# Patient Record
Sex: Female | Born: 1940 | Race: White | Hispanic: No | Smoking: Never smoker
Health system: Southern US, Community
[De-identification: ages and names within clinical notes are randomized; demographics above are authoritative.]

## PROBLEM LIST (undated history)

## (undated) DIAGNOSIS — I2510 Atherosclerotic heart disease of native coronary artery without angina pectoris: Secondary | ICD-10-CM

## (undated) DIAGNOSIS — I10 Essential (primary) hypertension: Secondary | ICD-10-CM

## (undated) DIAGNOSIS — E7800 Pure hypercholesterolemia, unspecified: Secondary | ICD-10-CM

## (undated) HISTORY — PX: GASTRIC BYPASS: SHX52

## (undated) HISTORY — PX: FACIAL COSMETIC SURGERY: SHX629

## (undated) HISTORY — PX: OTHER SURGICAL HISTORY: SHX169

## (undated) HISTORY — PX: COLON RESECTION: SHX5231

## (undated) HISTORY — PX: ABDOMINAL HYSTERECTOMY: SHX81

## (undated) HISTORY — PX: FRACTURE SURGERY: SHX138

## (undated) NOTE — *Deleted (*Deleted)
Date of Admission:  07/12/2020   Total days of antibiotics ***        Day ***        Day ***        Day ***   ID: Casey Baldwin is a 76 y.o. female with  *** Active Problems:   SIRS (systemic inflammatory response syndrome) (HCC)   Shingles rash   Delirium   Acute metabolic encephalopathy   Essential hypertension   Anemia   Acute lower UTI   Chronic diastolic CHF (congestive heart failure) (HCC)   Sepsis (HCC)   Change in mental status   Anorexia   Malnutrition of moderate degree   Hyponatremia   Obstructive sleep apnea   PSVT (paroxysmal supraventricular tachycardia) (HCC)   Thrombocytosis   Acute urinary retention   Aspiration pneumonia of both lower lobes due to gastric secretions (HCC)   Viral encephalitis    Subjective: ***  Medications:  . amoxicillin-clavulanate  800 mg Oral Q12H  . Chlorhexidine Gluconate Cloth  6 each Topical Daily  . enoxaparin (LOVENOX) injection  40 mg Subcutaneous Q24H  . feeding supplement (ENSURE ENLIVE)  237 mL Oral TID BM  . free water  20 mL Per Tube Q6H  . insulin aspart  0-9 Units Subcutaneous Q4H  . lactulose  20 g Oral BID  . liver oil-zinc oxide   Topical BID  . megestrol  400 mg Oral Daily  . melatonin  5 mg Oral QHS  . metoprolol tartrate  12.5 mg Oral BID  . modafinil  100 mg Oral Daily  . multivitamin  15 mL Oral Daily  . pantoprazole sodium  40 mg Oral Daily  . sodium chloride flush  10-40 mL Intracatheter Q12H  . sodium chloride flush  3 mL Intravenous Q12H  . sodium chloride  1 g Oral BID WC  . tamsulosin  0.4 mg Oral QPC supper    Objective: Vital signs in last 24 hours: Temp:  [97.8 F (36.6 C)-98.7 F (37.1 C)] 97.8 F (36.6 C) (10/13 0738) Pulse Rate:  [88-91] 91 (10/13 0738) Resp:  [16-19] 19 (10/13 0738) BP: (134-146)/(73-80) 134/80 (10/13 0738) SpO2:  [98 %-99 %] 98 % (10/13 0738) Weight:  [75.7 kg] 75.7 kg (10/13 0137)  PHYSICAL EXAM:  General: Alert, cooperative, no distress, appears stated  age.  Head: Normocephalic, without obvious abnormality, atraumatic. Eyes: Conjunctivae clear, anicteric sclerae. Pupils are equal ENT Nares normal. No drainage or sinus tenderness. Lips, mucosa, and tongue normal. No Thrush Neck: Supple, symmetrical, no adenopathy, thyroid: non tender no carotid bruit and no JVD. Back: No CVA tenderness. Lungs: Clear to auscultation bilaterally. No Wheezing or Rhonchi. No rales. Heart: Regular rate and rhythm, no murmur, rub or gallop. Abdomen: Soft, non-tender,not distended. Bowel sounds normal. No masses Extremities: atraumatic, no cyanosis. No edema. No clubbing Skin: No rashes or lesions. Or bruising Lymph: Cervical, supraclavicular normal. Neurologic: Grossly non-focal  Lab Results CBC Latest Ref Rng & Units 08/21/2020 08/19/2020 08/15/2020  WBC 4.0 - 10.5 K/uL 10.4 10.3 9.0  Hemoglobin 12.0 - 15.0 g/dL 7.8(I) 6.9(G) 2.9(B)  Hematocrit 36 - 46 % 28.9(L) 26.7(L) 27.3(L)  Platelets 150 - 400 K/uL 366 274 322    CMP Latest Ref Rng & Units 08/21/2020 08/19/2020 08/16/2020  Glucose 70 - 99 mg/dL 284(X) 324(M) 010(U)  BUN 8 - 23 mg/dL 72(Z) 36(U) 44(I)  Creatinine 0.44 - 1.00 mg/dL 3.47 4.25 9.56  Sodium 135 - 145 mmol/L 134(L) 134(L) 136  Potassium 3.5 -  5.1 mmol/L 4.3 4.0 3.9  Chloride 98 - 111 mmol/L 99 100 99  CO2 22 - 32 mmol/L 24 24 28   Calcium 8.9 - 10.3 mg/dL 9.5 1.1(B) 9.0  Total Protein 6.5 - 8.1 g/dL - - -  Total Bilirubin 0.3 - 1.2 mg/dL - - -  Alkaline Phos 38 - 126 U/L - - -  AST 15 - 41 U/L - - -  ALT 0 - 44 U/L - - -    Assessment/Plan: Prolonged hospitalization with encephalopathy with risk of aspiration I do not see signs of aspiration pneumonia on CXR, clinical examination. So would recommend stopping antibiotics Dubhoff was removed on Monday which will decrease risk for aspiration  Hypoactive deliriumfollowing VZV meningoencephalitis - fluctuating level of alertness- overall improving alertness in the past week.    Periods of lucidity and interaction with somnolence. She needs constant stimulation and activity Need PT/OT/speech, OOB, breathing exercise,Regular bowel movts, Naoptimization andc[facility]  Admitted on 9/12 with chest pain rt side, fever, headache and leg painandintermittent confusion  Found to have herpes zoster Thoracic dermatome  Herpes zoster with aseptic meningitis on admission ( patient had refused LPinitially)- was started on IV acyclovir 10mg /Kg q 8. She was alert and verbal and appropriate on presentation but after 5-6 days gradual deterioration to obtundation. Seen by neuro- delirium was questioned -EEG on 07/20/20 no seizure activity..There were many reasons including changes in sleep rhythm, poor intake, constipation, not wearing CPAp ( but no co2 narcosis) hyponatremia for the obtundation On 9/14 Had LP which revealed lymphocytic pleocytosis bacterial culture neg and HSV DNA neg- VZVneg .VzV ig G very high> 4000 west Nile virus IgG in csf was positive but not IgM and serum antibodies neg- so this isless likelyacute WNV and IgG is not a reliable test for diagnosis of acute infection as there is cross reactivity with other flavi virus and dengue-There is no treatment for WNV.  MRI and MRA done on 9/14 no acute changes. Daughter wanted her to be in [place], but no bed andapparently [facility] has declined [facility] transfer Repeat EEG done on 07/25/20 no seizure activity noted. keppra stopped So the diagnosis remains VZV inducedmeningoencephalitis) Completed 21 days of acycolvir threapy for VZV induced meningo encephalitison 08/02/20  waxing and waning mentalstatussince earlier in admission  Urinary retention -9/12- has foley-failed voiding trial on 9/28 and reinserted 9/29  Stiffness and pain lower extremities and neck due to immobility ( Xray and MRI of the rt leg- nothing acute)  Rt sided abdominal firmness and fullness CT abdomen  showedFluid collectionpseudocyst  Complicated stay at [facility] between 04/01/20 until 05/14/20 for abdominal pain which was diagnosed as choledocholithiasis/cholangitis in a setting of prior RYGB in 2008. 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 and she was [facility] to [facility] for diagnostic lap, robotic lysis of adhesions, transgastric ERCP and biliary sphincterotomy, cholecystectomy, partial gastrectomy andaccidentalcolotomy which was repaired.The immediate post op was complicated by post ERCP pancreatitis , pulmonary edema with acute hypoxic resp failure , Afib ( treated with amio and metoprolol), fever and leucocytosis suspected due to pancreatitis and was treated with zosyn, UTI due to proteus and acute on chronic anemia needing PRBC and AKI.She was seen by gerontologist for insomnia and risk for delirium   H/o left TKA  Hearing loss   Discussed the management withcare team

---

## 2017-09-05 ENCOUNTER — Emergency Department (HOSPITAL_COMMUNITY)
Admission: EM | Admit: 2017-09-05 | Discharge: 2017-09-05 | Disposition: A | Discharge status: Home / Self Care | Payer: Medicare Other | Attending: Emergency Medicine | Admitting: Emergency Medicine

## 2017-09-05 ENCOUNTER — Encounter (HOSPITAL_COMMUNITY): Payer: Self-pay | Admitting: *Deleted

## 2017-09-05 ENCOUNTER — Emergency Department (HOSPITAL_COMMUNITY): Payer: Medicare Other

## 2017-09-05 DIAGNOSIS — I10 Essential (primary) hypertension: Secondary | ICD-10-CM | POA: Insufficient documentation

## 2017-09-05 DIAGNOSIS — Z79899 Other long term (current) drug therapy: Secondary | ICD-10-CM | POA: Diagnosis not present

## 2017-09-05 DIAGNOSIS — N390 Urinary tract infection, site not specified: Secondary | ICD-10-CM | POA: Diagnosis not present

## 2017-09-05 DIAGNOSIS — D649 Anemia, unspecified: Secondary | ICD-10-CM | POA: Diagnosis not present

## 2017-09-05 DIAGNOSIS — Z96652 Presence of left artificial knee joint: Secondary | ICD-10-CM | POA: Insufficient documentation

## 2017-09-05 DIAGNOSIS — I2510 Atherosclerotic heart disease of native coronary artery without angina pectoris: Secondary | ICD-10-CM | POA: Diagnosis not present

## 2017-09-05 DIAGNOSIS — Z91040 Latex allergy status: Secondary | ICD-10-CM | POA: Diagnosis not present

## 2017-09-05 DIAGNOSIS — R300 Dysuria: Secondary | ICD-10-CM | POA: Diagnosis present

## 2017-09-05 HISTORY — DX: Pure hypercholesterolemia, unspecified: E78.00

## 2017-09-05 HISTORY — DX: Atherosclerotic heart disease of native coronary artery without angina pectoris: I25.10

## 2017-09-05 HISTORY — DX: Essential (primary) hypertension: I10

## 2017-09-05 LAB — URINALYSIS, ROUTINE W REFLEX MICROSCOPIC
Bilirubin Urine: NEGATIVE
GLUCOSE, UA: NEGATIVE mg/dL
KETONES UR: NEGATIVE mg/dL
Nitrite: NEGATIVE
PROTEIN: NEGATIVE mg/dL
Specific Gravity, Urine: 1.019 (ref 1.005–1.030)
pH: 5 (ref 5.0–8.0)

## 2017-09-05 LAB — BASIC METABOLIC PANEL
Anion gap: 10 (ref 5–15)
BUN: 22 mg/dL — AB (ref 6–20)
CHLORIDE: 103 mmol/L (ref 101–111)
CO2: 23 mmol/L (ref 22–32)
Calcium: 8.8 mg/dL — ABNORMAL LOW (ref 8.9–10.3)
Creatinine, Ser: 1.07 mg/dL — ABNORMAL HIGH (ref 0.44–1.00)
GFR calc Af Amer: 56 mL/min — ABNORMAL LOW (ref >60)
GFR calc non Af Amer: 48 mL/min — ABNORMAL LOW (ref >60)
GLUCOSE: 137 mg/dL — AB (ref 65–99)
POTASSIUM: 4.1 mmol/L (ref 3.5–5.1)
SODIUM: 136 mmol/L (ref 135–145)

## 2017-09-05 LAB — CBC
HCT: 31.5 % — ABNORMAL LOW (ref 36.0–46.0)
HEMOGLOBIN: 10.2 g/dL — AB (ref 12.0–15.0)
MCH: 31.2 pg (ref 26.0–34.0)
MCHC: 32.4 g/dL (ref 30.0–36.0)
MCV: 96.3 fL (ref 78.0–100.0)
Platelets: 236 10*3/uL (ref 150–400)
RBC: 3.27 MIL/uL — AB (ref 3.87–5.11)
RDW: 13.7 % (ref 11.5–15.5)
WBC: 9.1 10*3/uL (ref 4.0–10.5)

## 2017-09-05 LAB — I-STAT CG4 LACTIC ACID, ED: Lactic Acid, Venous: 0.68 mmol/L (ref 0.5–1.9)

## 2017-09-05 MED ORDER — ACETAMINOPHEN 325 MG PO TABS
650.0000 mg | ORAL_TABLET | Freq: Once | ORAL | Status: AC
  Administered 2017-09-05: 650 mg via ORAL
  Filled 2017-09-05: qty 2

## 2017-09-05 MED ORDER — SODIUM CHLORIDE 0.9 % IV BOLUS (SEPSIS): 1000.0000 mL | Freq: Once | INTRAVENOUS | Status: DC

## 2017-09-05 MED ORDER — SODIUM CHLORIDE 0.9 % IV BOLUS (SEPSIS)
1000.0000 mL | Freq: Once | INTRAVENOUS | Status: AC
  Administered 2017-09-05: 1000 mL via INTRAVENOUS

## 2017-09-05 MED ORDER — DEXTROSE 5 % IV SOLN
2.0000 g | Freq: Once | INTRAVENOUS | Status: AC
  Administered 2017-09-05: 2 g via INTRAVENOUS
  Filled 2017-09-05: qty 2

## 2017-09-05 MED ORDER — CEPHALEXIN 500 MG PO CAPS: 500.0000 mg | ORAL_CAPSULE | Freq: Four times a day (QID) | ORAL | 0 refills | Status: DC

## 2017-09-05 NOTE — Discharge Instructions (Signed)
Get plenty of rest and drink a lot of fluids.  Take Tylenol as needed for fever, every 4 hours.  An appropriate dose for you is 650 mg.  Your hemoglobin was somewhat low, 10.2.  Have your doctor recheck it when you see her.  Start the antibiotic prescription, cephalexin, tomorrow.

## 2017-09-05 NOTE — ED Triage Notes (Signed)
Pt reports lower back pain, urinary frequency, urgency and dysuria. Pt also c/o fever and chills. Highest temperature was 102.4 and pt took aspirin. Pt's temp in triage was 101.0. [facility]

## 2017-09-05 NOTE — ED Provider Notes (Signed)
[facility] EMERGENCY DEPARTMENT Provider Note   CSN: [id number] Arrival date & time: 09/05/17  4098     History   Chief Complaint Chief Complaint  Patient presents with  . Dysuria    fever, chills, lower part of her back      HPI Casey Baldwin is a 76 y.o. female.  She presents for evaluation of fever and dysuria.  Symptoms started 1 week ago.  Like she is getting worse, with some dizziness, decreased appetite, and generalized weakness.  She is taking aspirin for fever.  She had rigors earlier today.  She is here with family members.  She is visiting from out of town.  Last UTI was 6 months ago.  There are no other known modifying factors.   HPI  Past Medical History:  Diagnosis Date  . Coronary artery disease   . High cholesterol   . Hypertension     There are no active problems to display for this patient.   Past Surgical History:  Procedure Laterality Date  . COLON RESECTION    . FACIAL COSMETIC SURGERY    . FRACTURE SURGERY     left leg and left ankle.  . left knee replacement      OB History    No data available       Home Medications    Prior to Admission medications   Medication Sig Start Date End Date Taking? Authorizing Provider  Artificial Tear Ointment (DRY EYES OP) Place 1-2 drops into both eyes daily as needed.   Yes [provider]  aspirin-acetaminophen-caffeine (EXCEDRIN MIGRAINE) [id number] MG tablet Take 2 tablets by mouth every 6 (six) hours as needed for headache or migraine.   Yes [provider]  Biotin 1 MG CAPS Take 1 capsule by mouth daily.   Yes [provider]  calcium carbonate (OS-CAL) 1250 (500 Ca) MG chewable tablet Chew 2 tablets by mouth daily.   Yes [provider]  Cholecalciferol (VITAMIN D PO) Take 1 tablet by mouth daily.   Yes [provider]  Cyanocobalamin (B12 LIQUID HEALTH BOOSTER PO) Take 1 Dose by mouth daily.   Yes [provider]  Multiple Vitamin  (MULTIVITAMIN WITH MINERALS) TABS tablet Take 1 tablet by mouth daily.   Yes [provider]  Multiple Vitamins-Minerals (ICAPS AREDS 2 PO) Take 1 capsule by mouth daily.   Yes [provider]  omeprazole (PRILOSEC) 40 MG capsule Take 40 mg by mouth daily.   Yes [provider]  pravastatin (PRAVACHOL) 20 MG tablet Take 20 mg by mouth daily.   Yes [provider]    Family History History reviewed. No pertinent family history.  Social History Social History  Substance Use Topics  . Smoking status: Never Smoker  . Smokeless tobacco: Never Used  . Alcohol use No     Allergies   Lactose intolerance (gi) and Latex   Review of Systems Review of Systems  All other systems reviewed and are negative.    Physical Exam Updated Vital Signs BP (!) 110/57   Pulse 79   Temp (!) 101.1 F (38.4 C) (Oral)   Resp 16   Ht 5\' 4"  (1.626 m)   Wt 88 kg (194 lb)   SpO2 96%   BMI 33.30 kg/m   Physical Exam  Constitutional: She is oriented to person, place, and time. She appears well-developed. No distress.  Elderly, frail  HENT:  Head: Normocephalic and atraumatic.  Eyes: Pupils are equal, round, and  reactive to light. Conjunctivae and EOM are normal.  Neck: Normal range of motion and phonation normal. Neck supple.  Cardiovascular: Normal rate and regular rhythm.   Pulmonary/Chest: Effort normal and breath sounds normal. She exhibits no tenderness.  Abdominal: Soft. She exhibits no distension. There is no tenderness. There is no guarding.  Genitourinary:  Genitourinary Comments: No costovertebral angle tenderness with percussion.  Musculoskeletal: Normal range of motion.  Neurological: She is alert and oriented to person, place, and time. She exhibits normal muscle tone.  Skin: Skin is warm and dry.  Psychiatric: She has a normal mood and affect. Her behavior is normal.  Nursing note and vitals reviewed.    ED Treatments / Results  Labs (all  labs ordered are listed, but only abnormal results are displayed) Labs Reviewed  CBC - Abnormal; Notable for the following:       Result Value   RBC 3.27 (*)    Hemoglobin 10.2 (*)    HCT 31.5 (*)    All other components within normal limits  BASIC METABOLIC PANEL - Abnormal; Notable for the following:    Glucose, Bld 137 (*)    BUN 22 (*)    Creatinine, Ser 1.07 (*)    Calcium 8.8 (*)    GFR calc non Af Amer 48 (*)    GFR calc Af Amer 56 (*)    All other components within normal limits  URINALYSIS, ROUTINE W REFLEX MICROSCOPIC - Abnormal; Notable for the following:    APPearance HAZY (*)    Hgb urine dipstick SMALL (*)    Leukocytes, UA MODERATE (*)    Bacteria, UA RARE (*)    Squamous Epithelial / LPF 0-5 (*)    All other components within normal limits  CULTURE, BLOOD (ROUTINE X 2)  CULTURE, BLOOD (ROUTINE X 2)  URINE CULTURE  I-STAT CG4 LACTIC ACID, ED  I-STAT CG4 LACTIC ACID, ED    EKG  EKG Interpretation None       Radiology Dg Chest Port 1 View  Result Date: 09/05/2017 CLINICAL DATA:  Fever. Sepsis. Back pain and urinary frequency with dysuria. EXAM: PORTABLE CHEST 1 VIEW COMPARISON:  None. FINDINGS: Lungs are adequately inflated without focal consolidation or effusion. Cardiomediastinal silhouette is within normal. There is calcified plaque over the aortic arch. Remaining bones and soft tissues are within normal. IMPRESSION: No acute cardiopulmonary disease. Electronically Signed   By: Elberta Fortisaniel  Boyle M.D.   On: 09/05/2017 20:15    Procedures Procedures (including critical care time)  Medications Ordered in ED Medications  sodium chloride 0.9 % bolus 1,000 mL (0 mLs Intravenous Stopped 09/05/17 2051)    And  sodium chloride 0.9 % bolus 1,000 mL (0 mLs Intravenous Stopped 09/05/17 2159)    And  sodium chloride 0.9 % bolus 1,000 mL (not administered)  acetaminophen (TYLENOL) tablet 650 mg (not administered)  cefTRIAXone (ROCEPHIN) 2 g in dextrose 5 % 50 mL  IVPB (0 g Intravenous Stopped 09/05/17 2051)     Initial Impression / Assessment and Plan / ED Course  I have reviewed the triage vital signs and the nursing notes.  Pertinent labs & imaging results that were available during my care of the patient were reviewed by me and considered in my medical decision making (see chart for details).  Clinical Course as of Sep 05 2221  Sat Sep 05, 2017  2217 Normal Sodium: 136 [EW]  2217 Mild elevation Creatinine: (!) 1.07 [EW]  2218 GFR, Est Non African American: Marland Kitchen(!)  48 [EW]  2218 Normal Lactic Acid, Venous: 0.68 [EW]  2218 Normal WBC: 9.1 [EW]  2218 Slightly low Hemoglobin: (!) 10.2 [EW]  2218 Abnormal, consistent with UTI WBC, UA: TOO NUMEROUS TO COUNT [EW]  2218 Normal DG Chest Port 1 View [EW]    Clinical Course User Index [EW] Mancel Bale, MD     Patient Vitals for the past 24 hrs:  BP Temp Temp src Pulse Resp SpO2 Height Weight  09/05/17 2025 - - - 79 16 96 % - -  09/05/17 2021 (!) 110/57 - - - (!) 21 - - -  09/05/17 2000 112/60 - - - - - - -  09/05/17 1923 - - - - - - 5\' 4"  (1.626 m) 88 kg (194 lb)  09/05/17 1920 128/72 (!) 101.1 F (38.4 C) Oral (!) 54 20 97 % - -    10:10 PM Reevaluation with update and discussion. After initial assessment and treatment, an updated evaluation reveals she is more comfortable now and has no further complaints.  Findings discussed with patient and daughters, all questions answered. Frutoso Dimare L      Final Clinical Impressions(s) / ED Diagnoses   Final diagnoses:  Urinary tract infection without hematuria, site unspecified  Anemia, unspecified type   UTI, with fever, but no signs for sepsis, metabolic instability or impending vascular collapse.  Nursing Notes Reviewed/ Care Coordinated Applicable Imaging Reviewed Interpretation of Laboratory Data incorporated into ED treatment  The patient appears reasonably screened and/or stabilized for discharge and I doubt any other medical  condition or other [facility] requiring further screening, evaluation, or treatment in the ED at this time prior to discharge.  Plan: Home Medications-continue current medications; Home Treatments-rest, fluids; return here if the recommended treatment, does not improve the symptoms; Recommended follow up-PCP checkup 1 week and as needed   New Prescriptions New Prescriptions   No medications on file     Mancel Bale, MD 09/05/17 2224

## 2017-09-08 LAB — URINE CULTURE: Culture: >=100000 — AB

## 2017-09-09 ENCOUNTER — Telehealth: Payer: Self-pay | Admitting: *Deleted

## 2017-09-09 NOTE — Telephone Encounter (Signed)
Post ED Visit - Positive Culture Follow-up  Culture report reviewed by antimicrobial stewardship pharmacist:  []  Enzo BiNathan Batchelder, Pharm.D. []  Celedonio MiyamotoJeremy Frens, [address]Ph[address] BCPS AQ-ID []  Garvin FilaMike Maccia, Pharm.D., BCPS []  Georgina PillionElizabeth Martin, [address]Ph[address] BCPS []  FrazerMinh Pham, [address]Ph[address] BCPS, AAHIVP []  Estella HuskMichelle Turner, Pharm.D., BCPS, AAHIVP []  Lysle Pearlachel Rumbarger, PharmD, BCPS []  Casilda Carlsaylor Stone, PharmD, BCPS []  Pollyann SamplesAndy Johnston, PharmD, BCPS Lilli LightJon Oriet, PharmD Positive urine culture Treated with Cephalexin, organism sensitive to the same and no further patient follow-up is required at this time.  Virl AxeRobertson, Sonali Wivell Talley 09/09/2017, 12:06 PM

## 2017-09-10 LAB — CULTURE, BLOOD (ROUTINE X 2)
Culture: NO GROWTH
Culture: NO GROWTH
SPECIAL REQUESTS: ADEQUATE
SPECIAL REQUESTS: ADEQUATE

## 2019-11-21 ENCOUNTER — Emergency Department
Admission: EM | Admit: 2019-11-21 | Discharge: 2019-11-21 | Disposition: A | Discharge status: Home / Self Care | Payer: Medicare Other | Attending: Emergency Medicine | Admitting: Emergency Medicine

## 2019-11-21 ENCOUNTER — Emergency Department: Payer: Medicare Other

## 2019-11-21 ENCOUNTER — Other Ambulatory Visit: Payer: Self-pay

## 2019-11-21 DIAGNOSIS — Z79899 Other long term (current) drug therapy: Secondary | ICD-10-CM | POA: Insufficient documentation

## 2019-11-21 DIAGNOSIS — K5900 Constipation, unspecified: Secondary | ICD-10-CM | POA: Diagnosis not present

## 2019-11-21 DIAGNOSIS — Z91040 Latex allergy status: Secondary | ICD-10-CM | POA: Diagnosis not present

## 2019-11-21 DIAGNOSIS — K649 Unspecified hemorrhoids: Secondary | ICD-10-CM | POA: Diagnosis not present

## 2019-11-21 DIAGNOSIS — K6289 Other specified diseases of anus and rectum: Secondary | ICD-10-CM | POA: Diagnosis present

## 2019-11-21 DIAGNOSIS — I2510 Atherosclerotic heart disease of native coronary artery without angina pectoris: Secondary | ICD-10-CM | POA: Insufficient documentation

## 2019-11-21 DIAGNOSIS — K644 Residual hemorrhoidal skin tags: Secondary | ICD-10-CM

## 2019-11-21 DIAGNOSIS — I10 Essential (primary) hypertension: Secondary | ICD-10-CM | POA: Insufficient documentation

## 2019-11-21 LAB — URINALYSIS, COMPLETE (UACMP) WITH MICROSCOPIC
Bacteria, UA: NONE SEEN
Bilirubin Urine: NEGATIVE
Glucose, UA: NEGATIVE mg/dL
Hgb urine dipstick: NEGATIVE
Ketones, ur: NEGATIVE mg/dL
Leukocytes,Ua: NEGATIVE
Nitrite: NEGATIVE
Protein, ur: NEGATIVE mg/dL
Specific Gravity, Urine: 1.017 (ref 1.005–1.030)
pH: 5 (ref 5.0–8.0)

## 2019-11-21 LAB — LIPASE, BLOOD: Lipase: 52 U/L — ABNORMAL HIGH (ref 11–51)

## 2019-11-21 LAB — CBC
HCT: 34 % — ABNORMAL LOW (ref 36.0–46.0)
Hemoglobin: 11 g/dL — ABNORMAL LOW (ref 12.0–15.0)
MCH: 30.8 pg (ref 26.0–34.0)
MCHC: 32.4 g/dL (ref 30.0–36.0)
MCV: 95.2 fL (ref 80.0–100.0)
Platelets: 311 10*3/uL (ref 150–400)
RBC: 3.57 MIL/uL — ABNORMAL LOW (ref 3.87–5.11)
RDW: 13.8 % (ref 11.5–15.5)
WBC: 11.6 10*3/uL — ABNORMAL HIGH (ref 4.0–10.5)
nRBC: 0 % (ref 0.0–0.2)

## 2019-11-21 LAB — COMPREHENSIVE METABOLIC PANEL
ALT: 23 U/L (ref 0–44)
AST: 21 U/L (ref 15–41)
Albumin: 3.8 g/dL (ref 3.5–5.0)
Alkaline Phosphatase: 90 U/L (ref 38–126)
Anion gap: 9 (ref 5–15)
BUN: 26 mg/dL — ABNORMAL HIGH (ref 8–23)
CO2: 23 mmol/L (ref 22–32)
Calcium: 8.9 mg/dL (ref 8.9–10.3)
Chloride: 106 mmol/L (ref 98–111)
Creatinine, Ser: 1.14 mg/dL — ABNORMAL HIGH (ref 0.44–1.00)
GFR calc Af Amer: 53 mL/min — ABNORMAL LOW (ref >60)
GFR calc non Af Amer: 45 mL/min — ABNORMAL LOW (ref >60)
Glucose, Bld: 140 mg/dL — ABNORMAL HIGH (ref 70–99)
Potassium: 4.1 mmol/L (ref 3.5–5.1)
Sodium: 138 mmol/L (ref 135–145)
Total Bilirubin: 0.5 mg/dL (ref 0.3–1.2)
Total Protein: 7.4 g/dL (ref 6.5–8.1)

## 2019-11-21 MED ORDER — WITCH HAZEL-GLYCERIN EX PADS: 1.0000 "application " | MEDICATED_PAD | CUTANEOUS | 12 refills | Status: DC | PRN

## 2019-11-21 MED ORDER — MAGNESIUM CITRATE PO SOLN
1.0000 | Freq: Once | ORAL | Status: AC
  Administered 2019-11-21: 1 via ORAL
  Filled 2019-11-21: qty 296

## 2019-11-21 MED ORDER — SODIUM CHLORIDE 0.9% FLUSH
3.0000 mL | Freq: Once | INTRAVENOUS | Status: AC
  Administered 2019-11-21: 3 mL via INTRAVENOUS

## 2019-11-21 MED ORDER — HYDROCORTISONE ACETATE 25 MG RE SUPP: 25.0000 mg | Freq: Two times a day (BID) | RECTAL | 0 refills | Status: DC

## 2019-11-21 MED ORDER — SORBITOL 70 % SOLN
960.0000 mL | TOPICAL_OIL | Freq: Once | ORAL | Status: DC
  Filled 2019-11-21: qty 473

## 2019-11-21 MED ORDER — SENNOSIDES-DOCUSATE SODIUM 8.6-50 MG PO TABS: 2.0000 | ORAL_TABLET | Freq: Two times a day (BID) | ORAL | 0 refills | Status: DC

## 2019-11-21 MED ORDER — IOHEXOL 300 MG/ML  SOLN
75.0000 mL | Freq: Once | INTRAMUSCULAR | Status: AC | PRN
  Administered 2019-11-21: 75 mL via INTRAVENOUS

## 2019-11-21 NOTE — ED Provider Notes (Signed)
[facility] Emergency Department Provider Note  ____________________________________________  Time seen: Approximately 9:57 AM  I have reviewed the triage vital signs and the nursing notes.   HISTORY  Chief Complaint Fecal Impaction    HPI Casey Baldwin is a 79 y.o. female with a history of CAD, hypertension  who comes the ED complaining of rectal pain and bleeding hemorrhoids since yesterday.  Last bowel movement 5 days ago, and she has been doing a lot of straining.  Denies abdominal pain nausea or vomiting.  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  Symptoms are constant, no aggravating or alleviating factors.     Past Medical History:  Diagnosis Date  . Coronary artery disease   . High cholesterol   . Hypertension      There are no problems to display for this patient.    Past Surgical History:  Procedure Laterality Date  . COLON RESECTION    . FACIAL COSMETIC SURGERY    . FRACTURE SURGERY     left leg and left ankle.  . left knee replacement       Prior to Admission medications   Medication Sig Start Date End Date Taking? Authorizing Provider  Artificial Tear Ointment (DRY EYES OP) Place 1-2 drops into both eyes daily as needed.    [provider]  aspirin-acetaminophen-caffeine (EXCEDRIN MIGRAINE) [phone] MG tablet Take 2 tablets by mouth every 6 (six) hours as needed for headache or migraine.    [provider]  Biotin 1 MG CAPS Take 1 capsule by mouth daily.    [provider]  calcium carbonate (OS-CAL) 1250 (500 Ca) MG chewable tablet Chew 2 tablets by mouth daily.    [provider]  cephALEXin (KEFLEX) 500 MG capsule Take 1 capsule (500 mg total) by mouth 4 (four) times daily. 09/05/17   Mancel Bale, MD  Cholecalciferol (VITAMIN D PO) Take 1 tablet by mouth daily.    [provider]  Cyanocobalamin (B12 LIQUID HEALTH BOOSTER PO) Take 1 Dose by mouth  daily.    [provider]  Multiple Vitamin (MULTIVITAMIN WITH MINERALS) TABS tablet Take 1 tablet by mouth daily.    [provider]  Multiple Vitamins-Minerals (ICAPS AREDS 2 PO) Take 1 capsule by mouth daily.    [provider]  omeprazole (PRILOSEC) 40 MG capsule Take 40 mg by mouth daily.    [provider]  pravastatin (PRAVACHOL) 20 MG tablet Take 20 mg by mouth daily.    [provider]     Allergies Lactose intolerance (gi) and Latex   No family history on file.  Social History Social History   Tobacco Use  . Smoking status: Never Smoker  . Smokeless tobacco: Never Used  Substance Use Topics  . Alcohol use: No  . Drug use: No    Review of Systems  Constitutional:   No fever or chills.  ENT:   No sore throat. No rhinorrhea. Cardiovascular:   No chest pain or syncope. Respiratory:   No dyspnea or cough. Gastrointestinal:   Negative for abdominal pain or vomiting.  Positive for rectal pain constipation and hemorrhoids. Musculoskeletal:   Negative for focal pain or swelling All other systems reviewed and are negative except as documented above in ROS and HPI.  ____________________________________________   PHYSICAL EXAM:  VITAL SIGNS: ED Triage Vitals  Enc Vitals Group     BP 11/21/19 0712 (!) 164/89     Pulse Rate 11/21/19 0712  84     Resp 11/21/19 0712 17     Temp 11/21/19 0712 99.8 F (37.7 C)     Temp Source 11/21/19 0712 Oral     SpO2 11/21/19 0712 97 %     Weight 11/21/19 0713 200 lb (90.7 kg)     Height 11/21/19 0713 5\' 4"  (1.626 m)     Head Circumference --      Peak Flow --      Pain Score 11/21/19 0712 9     Pain Loc --      Pain Edu? --      Excl. in [place]? --     Vital signs reviewed, nursing assessments reviewed.   Constitutional:   Alert and oriented. Non-toxic appearance. Eyes:   Conjunctivae are normal. EOMI. PERRL. ENT      Head:   Normocephalic and atraumatic.      Nose:   Wearing a  mask.      Mouth/Throat:   Wearing a mask.      Neck:   No meningismus. Full ROM. Hematological/Lymphatic/Immunilogical:   No cervical lymphadenopathy. Cardiovascular:   RRR. Symmetric bilateral radial and DP pulses.  No murmurs. Cap refill less than 2 seconds. Respiratory:   Normal respiratory effort without tachypnea/retractions. Breath sounds are clear and equal bilaterally. No wheezes/rales/rhonchi. Gastrointestinal:   Soft and nontender. Non distended. There is no CVA tenderness.  No rebound, rigidity, or guarding.  Rectal exam performed with nurse Casey Baldwin at bedside.  Brown stool, no fecal impaction.  There are inflamed external hemorrhoids which are tender to the touch without bleeding.  Musculoskeletal:   Normal range of motion in all extremities. No joint effusions.  No lower extremity tenderness.  No edema. Neurologic:   Normal speech and language.  Motor grossly intact. No acute focal neurologic deficits are appreciated.  Skin:    Skin is warm, dry and intact. No rash noted.  No petechiae, purpura, or bullae.  ____________________________________________    LABS (pertinent positives/negatives) (all labs ordered are listed, but only abnormal results are displayed) Labs Reviewed  LIPASE, BLOOD - Abnormal; Notable for the following components:      Result Value   Lipase 52 (*)    All other components within normal limits  COMPREHENSIVE METABOLIC PANEL - Abnormal; Notable for the following components:   Glucose, Bld 140 (*)    BUN 26 (*)    Creatinine, Ser 1.14 (*)    GFR calc non Af Amer 45 (*)    GFR calc Af Amer 53 (*)    All other components within normal limits  CBC - Abnormal; Notable for the following components:   WBC 11.6 (*)    RBC 3.57 (*)    Hemoglobin 11.0 (*)    HCT 34.0 (*)    All other components within normal limits  URINALYSIS, COMPLETE (UACMP) WITH MICROSCOPIC    ____________________________________________   EKG    ____________________________________________    RADIOLOGY  No results found.  ____________________________________________   PROCEDURES Procedures  ____________________________________________  DIFFERENTIAL DIAGNOSIS   Diverticulitis, bowel obstruction, constipation  CLINICAL IMPRESSION / ASSESSMENT AND PLAN / ED COURSE  Medications ordered in the ED: Medications  sodium chloride flush (NS) 0.9 % injection 3 mL (has no administration in time range)  iohexol (OMNIPAQUE) 300 MG/ML solution 75 mL (75 mLs Intravenous Contrast Given 11/21/19 0956)    Pertinent labs & imaging results that were available during my care of the patient were reviewed by me and considered in my  medical decision making (see chart for details).  Casey Baldwin was evaluated in Emergency Department on 11/21/2019 for the symptoms described in the history of present illness. She was evaluated in the context of the global COVID-19 pandemic, which necessitated consideration that the patient might be at risk for infection with the SARS-CoV-2 virus that causes COVID-19. Institutional protocols and algorithms that pertain to the evaluation of patients at risk for COVID-19 are in a state of rapid change based on information released by regulatory bodies including the CDC and federal and state organizations. These policies and algorithms were followed during the patient's care in the ED.   Patient presents with rectal pain and reported hemorrhoidal bleeding which appears to be due to her inflamed hemorrhoids from straining and constipation.  However, her inability to have a bowel movement for 5 days in the absence of a fecal impaction on exam is worrisome for a bowel obstruction.  Given her surgical history and borderline fever, I will obtain a CT scan to further evaluate.   ----------------------------------------- 12:40 PM on  11/21/2019 -----------------------------------------  CT scan unremarkable, vital signs remained stable and normal.  Patient given a bottle of magnesium citrate, had a large bowel movement.  Feeling better, stable for discharge home.  I will start her on Colace as well as TUCKS and Anusol for her hemorrhoids.     ____________________________________________   FINAL CLINICAL IMPRESSION(S) / ED DIAGNOSES    Final diagnoses:  Inflamed external hemorrhoid  Constipation, unspecified constipation type     ED Discharge Orders    None      Portions of this note were generated with dragon dictation software. Dictation errors may occur despite best attempts at proofreading.   Sharman Cheek, MD 11/21/19 1241

## 2019-11-21 NOTE — ED Notes (Signed)
Pt visualized getting ready and walking to car for discharge by this nurse. Pt is steady on feet and does not feel lightheaded at this time. Pt safe to drive home.

## 2019-11-21 NOTE — ED Notes (Signed)
Pt assisted to bedside toilet at this time. Pt had bowel movement.

## 2019-11-21 NOTE — ED Triage Notes (Signed)
Pt c/o having rectal pain,unable to have a BM in the past 3 days with a hx of fecal impaction and colon resection

## 2020-04-01 ENCOUNTER — Emergency Department
Admission: EM | Admit: 2020-04-01 | Discharge: 2020-04-01 | Disposition: A | Discharge status: Other Acute Inpatient Hospital | Payer: Medicare Other | Attending: Student | Admitting: Student

## 2020-04-01 ENCOUNTER — Emergency Department: Payer: Medicare Other

## 2020-04-01 ENCOUNTER — Other Ambulatory Visit: Payer: Self-pay

## 2020-04-01 ENCOUNTER — Encounter: Payer: Self-pay | Admitting: Emergency Medicine

## 2020-04-01 DIAGNOSIS — R0602 Shortness of breath: Secondary | ICD-10-CM | POA: Insufficient documentation

## 2020-04-01 DIAGNOSIS — I2510 Atherosclerotic heart disease of native coronary artery without angina pectoris: Secondary | ICD-10-CM | POA: Insufficient documentation

## 2020-04-01 DIAGNOSIS — R0789 Other chest pain: Secondary | ICD-10-CM | POA: Insufficient documentation

## 2020-04-01 DIAGNOSIS — Z20822 Contact with and (suspected) exposure to covid-19: Secondary | ICD-10-CM | POA: Diagnosis not present

## 2020-04-01 DIAGNOSIS — Z79899 Other long term (current) drug therapy: Secondary | ICD-10-CM | POA: Insufficient documentation

## 2020-04-01 DIAGNOSIS — M791 Myalgia, unspecified site: Secondary | ICD-10-CM | POA: Diagnosis not present

## 2020-04-01 DIAGNOSIS — K8050 Calculus of bile duct without cholangitis or cholecystitis without obstruction: Secondary | ICD-10-CM | POA: Insufficient documentation

## 2020-04-01 DIAGNOSIS — R252 Cramp and spasm: Secondary | ICD-10-CM | POA: Diagnosis not present

## 2020-04-01 DIAGNOSIS — R509 Fever, unspecified: Secondary | ICD-10-CM

## 2020-04-01 DIAGNOSIS — Z91040 Latex allergy status: Secondary | ICD-10-CM | POA: Diagnosis not present

## 2020-04-01 DIAGNOSIS — R079 Chest pain, unspecified: Secondary | ICD-10-CM

## 2020-04-01 DIAGNOSIS — I10 Essential (primary) hypertension: Secondary | ICD-10-CM | POA: Diagnosis not present

## 2020-04-01 DIAGNOSIS — R52 Pain, unspecified: Secondary | ICD-10-CM

## 2020-04-01 LAB — URINALYSIS, COMPLETE (UACMP) WITH MICROSCOPIC
Bacteria, UA: NONE SEEN
Bilirubin Urine: NEGATIVE
Glucose, UA: NEGATIVE mg/dL
Hgb urine dipstick: NEGATIVE
Ketones, ur: NEGATIVE mg/dL
Leukocytes,Ua: NEGATIVE
Nitrite: NEGATIVE
Protein, ur: NEGATIVE mg/dL
Specific Gravity, Urine: 1.024 (ref 1.005–1.030)
pH: 6 (ref 5.0–8.0)

## 2020-04-01 LAB — BASIC METABOLIC PANEL
Anion gap: 9 (ref 5–15)
BUN: 19 mg/dL (ref 8–23)
CO2: 23 mmol/L (ref 22–32)
Calcium: 8.8 mg/dL — ABNORMAL LOW (ref 8.9–10.3)
Chloride: 106 mmol/L (ref 98–111)
Creatinine, Ser: 0.98 mg/dL (ref 0.44–1.00)
GFR calc Af Amer: >60 mL/min (ref >60)
GFR calc non Af Amer: 54 mL/min — ABNORMAL LOW (ref >60)
Glucose, Bld: 137 mg/dL — ABNORMAL HIGH (ref 70–99)
Potassium: 4.4 mmol/L (ref 3.5–5.1)
Sodium: 138 mmol/L (ref 135–145)

## 2020-04-01 LAB — HEPATIC FUNCTION PANEL
ALT: 245 U/L — ABNORMAL HIGH (ref 0–44)
AST: 269 U/L — ABNORMAL HIGH (ref 15–41)
Albumin: 3.2 g/dL — ABNORMAL LOW (ref 3.5–5.0)
Alkaline Phosphatase: 134 U/L — ABNORMAL HIGH (ref 38–126)
Bilirubin, Direct: 0.1 mg/dL (ref 0.0–0.2)
Indirect Bilirubin: 0.5 mg/dL (ref 0.3–0.9)
Total Bilirubin: 0.6 mg/dL (ref 0.3–1.2)
Total Protein: 6.2 g/dL — ABNORMAL LOW (ref 6.5–8.1)

## 2020-04-01 LAB — CBC
HCT: 31.8 % — ABNORMAL LOW (ref 36.0–46.0)
Hemoglobin: 10.5 g/dL — ABNORMAL LOW (ref 12.0–15.0)
MCH: 30.6 pg (ref 26.0–34.0)
MCHC: 33 g/dL (ref 30.0–36.0)
MCV: 92.7 fL (ref 80.0–100.0)
Platelets: 251 10*3/uL (ref 150–400)
RBC: 3.43 MIL/uL — ABNORMAL LOW (ref 3.87–5.11)
RDW: 14.4 % (ref 11.5–15.5)
WBC: 7.8 10*3/uL (ref 4.0–10.5)
nRBC: 0 % (ref 0.0–0.2)

## 2020-04-01 LAB — TROPONIN I (HIGH SENSITIVITY)
Troponin I (High Sensitivity): 5 ng/L (ref <18)
Troponin I (High Sensitivity): 5 ng/L (ref <18)

## 2020-04-01 LAB — SARS CORONAVIRUS 2 BY RT PCR (HOSPITAL ORDER, PERFORMED IN ~~LOC~~ HOSPITAL LAB): SARS Coronavirus 2: NEGATIVE

## 2020-04-01 LAB — LACTIC ACID, PLASMA: Lactic Acid, Venous: 1.5 mmol/L (ref 0.5–1.9)

## 2020-04-01 LAB — LIPASE, BLOOD: Lipase: 48 U/L (ref 11–51)

## 2020-04-01 LAB — PROCALCITONIN: Procalcitonin: 0.19 ng/mL

## 2020-04-01 MED ORDER — LACTATED RINGERS IV BOLUS (SEPSIS)
1000.0000 mL | Freq: Once | INTRAVENOUS | Status: AC
  Administered 2020-04-01: 1000 mL via INTRAVENOUS

## 2020-04-01 MED ORDER — ACETAMINOPHEN 325 MG PO TABS
650.0000 mg | ORAL_TABLET | Freq: Once | ORAL | Status: AC | PRN
  Administered 2020-04-01: 650 mg via ORAL
  Filled 2020-04-01: qty 2

## 2020-04-01 MED ORDER — IOHEXOL 300 MG/ML  SOLN
100.0000 mL | Freq: Once | INTRAMUSCULAR | Status: AC | PRN
  Administered 2020-04-01: 100 mL via INTRAVENOUS

## 2020-04-01 MED ORDER — IBUPROFEN 400 MG PO TABS
400.0000 mg | ORAL_TABLET | Freq: Once | ORAL | Status: AC
  Administered 2020-04-01: 400 mg via ORAL
  Filled 2020-04-01: qty 1

## 2020-04-01 MED ORDER — METRONIDAZOLE IN NACL 5-0.79 MG/ML-% IV SOLN
500.0000 mg | Freq: Once | INTRAVENOUS | Status: AC
  Administered 2020-04-01: 500 mg via INTRAVENOUS
  Filled 2020-04-01: qty 100

## 2020-04-01 MED ORDER — SODIUM CHLORIDE 0.9 % IV SOLN
1.0000 g | INTRAVENOUS | Status: DC
  Administered 2020-04-01: 1 g via INTRAVENOUS
  Filled 2020-04-01: qty 10

## 2020-04-01 MED ORDER — ACETAMINOPHEN 325 MG PO TABS
650.0000 mg | ORAL_TABLET | Freq: Once | ORAL | Status: AC
  Administered 2020-04-01: 650 mg via ORAL
  Filled 2020-04-01: qty 2

## 2020-04-01 NOTE — ED Notes (Signed)
emtala reviewed by this RN 

## 2020-04-01 NOTE — ED Notes (Signed)
Sat level of 71% charted incorrectly. Pt Sat level at this time is 96%.

## 2020-04-01 NOTE — ED Triage Notes (Signed)
Pt to ED via POV c/o blood in urine, fever, back pain, body aches, and report of possible heart attack yesterday. Pt states that she felt like she had a very heavy weight on her chest yesterday and it felt like she was not able to breath. Pt states that she is having severe pain in both of her legs, pt states that her legs hurt so bad she can hardly stand up. Pt states that she had chills last night as well as increased urination. Pt states that she also has a very dry mouth. Pt is in NAD.

## 2020-04-01 NOTE — ED Provider Notes (Signed)
[facility] Emergency Department Provider Note  ____________________________________________   First MD Initiated Contact with Patient 04/01/20 [phone]     (approximate)  I have reviewed the triage vital signs and the nursing notes.  History  Chief Complaint Hematuria, Back Pain, and Fever    HPI Casey Baldwin is a 79 y.o. female with history of CAD, HLD, HTN who presents to the emergency department for fever, chest discomfort, shortness of breath, back pain, hematuria, urinary frequency, body aches.  Patient states she first started feeling generally unwell on Friday, and had associated chest discomfort.  Discomfort is central in location, describes it like a heaviness.  No radiation.  She was able to burp and pass some gas which helped alleviate her symptoms.  Late on Saturday her chest discomfort returned, again describes it as a heaviness, central in location.  Moderate in severity.  No radiation.  Now with no alleviating/aggravating components.  This is also associated with some shortness of breath, no cough.  Last night she developed generalized body aches, leg cramping, back pain (she describes as kidney pain) and lower abdominal pain.  Reports the body aches are severe, described as aching.  She noticed increased urinary frequency throughout the night, but is unsure if this is related to her Lasix.  Also noticed some hematuria.  Denies any dysuria.  Denies any sick contacts.  Has been fully vaccinated against COVID.  Denies any history of kidney stones.   Past Medical Hx Past Medical History:  Diagnosis Date  . Coronary artery disease   . High cholesterol   . Hypertension     Problem List There are no problems to display for this patient.   Past Surgical Hx Past Surgical History:  Procedure Laterality Date  . COLON RESECTION    . FACIAL COSMETIC SURGERY    . FRACTURE SURGERY     left leg and left ankle.  . left knee replacement      Medications  Prior to Admission medications   Medication Sig Start Date End Date Taking? Authorizing Provider  Artificial Tear Ointment (DRY EYES OP) Place 1-2 drops into both eyes daily as needed.    [provider]  aspirin-acetaminophen-caffeine (EXCEDRIN MIGRAINE) [phone] MG tablet Take 2 tablets by mouth every 6 (six) hours as needed for headache or migraine.    [provider]  Biotin 1 MG CAPS Take 1 capsule by mouth daily.    [provider]  calcium carbonate (OS-CAL) 1250 (500 Ca) MG chewable tablet Chew 2 tablets by mouth daily.    [provider]  cephALEXin (KEFLEX) 500 MG capsule Take 1 capsule (500 mg total) by mouth 4 (four) times daily. 09/05/17   Daleen Bo, MD  Cholecalciferol (VITAMIN D PO) Take 1 tablet by mouth daily.    [provider]  Cyanocobalamin (B12 LIQUID HEALTH BOOSTER PO) Take 1 Dose by mouth daily.    [provider]  hydrocortisone (ANUSOL-HC) 25 MG suppository Place 1 suppository (25 mg total) rectally 2 (two) times daily. 11/21/19   Carrie Mew, MD  Multiple Vitamin (MULTIVITAMIN WITH MINERALS) TABS tablet Take 1 tablet by mouth daily.    [provider]  Multiple Vitamins-Minerals (ICAPS AREDS 2 PO) Take 1 capsule by mouth daily.    [provider]  omeprazole (PRILOSEC) 40 MG capsule Take 40 mg by mouth daily.    [provider]  pravastatin (PRAVACHOL) 20 MG tablet Take 20 mg by mouth daily.    [provider]  senna-docusate (SENOKOT-S) 8.6-50 MG tablet Take 2 tablets by mouth 2 (two) times daily. 11/21/19   Sharman Cheek, MD  witch hazel-glycerin (TUCKS) pad Apply 1 application topically as needed for itching. 11/21/19   Sharman Cheek, MD    Allergies Lactose intolerance (gi) and Latex  Family Hx No family history on file.  Social Hx Social History   Tobacco Use  . Smoking status: Never Smoker  . Smokeless tobacco: Never Used  Substance Use Topics  .  Alcohol use: No  . Drug use: No     Review of Systems  Constitutional: Positive for fever, body aches.   Eyes: Negative for visual changes. ENT: Negative for sore throat. Cardiovascular: Positive for chest pain. Respiratory: Positive for shortness of breath. Gastrointestinal: Negative for nausea. Negative for vomiting.  Genitourinary: Positive for hematuria, urinary frequency. Musculoskeletal: Negative for leg swelling.  Positive for body aches, back pain. Skin: Negative for rash. Neurological: Negative for headaches.   Physical Exam  Vital Signs: ED Triage Vitals  Enc Vitals Group     BP 04/01/20 0724 (!) 122/54     Pulse Rate 04/01/20 0722 78     Resp 04/01/20 0722 16     Temp 04/01/20 0722 (!) 102.3 F (39.1 C)     Temp Source 04/01/20 0722 Oral     SpO2 04/01/20 0722 94 %     Weight 04/01/20 0724 194 lb (88 kg)     Height 04/01/20 0724 5\' 4"  (1.626 m)     Head Circumference --      Peak Flow --      Pain Score 04/01/20 0723 10     Pain Loc --      Pain Edu? --      Excl. in [place]? --     Constitutional: Alert and oriented.  Appears fatigued, feeling generally unwell. Head: Normocephalic. Atraumatic. Eyes: Conjunctivae clear. Sclera anicteric. Pupils equal and symmetric. Nose: No masses or lesions. No congestion or rhinorrhea. Mouth/Throat: Wearing mask.  Neck: No stridor. Trachea midline.  Cardiovascular: Normal rate, regular rhythm. Extremities well perfused. Respiratory: Normal respiratory effort.  Lungs CTAB. Gastrointestinal: Soft. Non-distended.  Mild discomfort to palpation to the lower abdomen, but no focal tenderness.  No rebound, guarding, rigidity. Genitourinary: Deferred. Musculoskeletal: No lower extremity edema. No deformities. Neurologic:  Normal speech and language. No gross focal or lateralizing neurologic deficits are appreciated.  Skin: Skin is warm, dry and intact. No rash noted. Psychiatric: Mood and affect are appropriate for situation.   EKG  Personally reviewed and interpreted by myself.   Date: 04/01/20 Time: 0731 Rate: 78 Rhythm: sinus Axis: left Intervals: WNL Sinus rhythm with PACs No STEMI    Radiology  Personally reviewed available imaging myself.   CXR - IMPRESSION:  No active cardiopulmonary disease.   CT A/P - IMPRESSION:  1. Cholelithiasis and choledocholithiasis. The CBD is larger than  before when a CBD stone was also visualized.  2. No explanation for hematuria.  3. Ventral hernias containing nonobstructed small bowel.   RUQ 04/03/20 - IMPRESSION:  Cholelithiasis without evidence of acute cholecystitis.  Choledocholithiasis: 9 mm calculus within the distal common bile  duct.  Borderline dilation of the common bile duct which measures 9 mm  transversely.    Procedures  Procedure(s) performed (including critical care):  .1-3 Lead EKG Interpretation Performed by: [place]., MD Authorized by: Miguel Aschoff., MD     ECG rate assessment: normal     Rhythm: sinus  rhythm     Ectopy: PAC   Comments:     Indication: Chest pain, shortness of breath Impression: NSR with PACs     Initial Impression / Assessment and Plan / MDM / ED Course  79 y.o. female who presents to the ED who presents for fever, chest discomfort, shortness of breath, back pain body aches, hematuria, urinary frequency.  Ddx: COVID, ACS/angina, pulmonary infection, UTI, nephrolithiasis, pyelonephritis  Patient notably febrile on arrival to 102.3.  Normotensive, no tachycardia.  Will pursue labs, including blood cultures, lactic.  Will initiate fluids and empiric antibiotics.  Clinical Course as of Apr 01 1438  Wynelle Link Apr 01, 2020  1200 CT imaging reveals cholelithiasis and choledocholithiasis.  CBD measures 9 mm.  Will add on Flagyl for anaerobic coverage and RUQ ultrasound and discuss with GI.  Suspect she may need transfer for ERCP.   [SM]  1346 AST and ALT elevated, normal bilirubin, normal lipase. Ultrasound  confirms CBD dilation to 9 mm along with stone visualized in the CBD.  Cholelithiasis without evidence of cholecystitis.  However, in the setting of her fever, concern for the potential to develop cholangitis versus early cholangitis.  She is receiving antibiotics for this.  She is febrile, but otherwise is hemodynamically stable.  Discussed case with [facility], Dr. Norma Fredrickson, who does recommend transfer for ERCP as we do not have immediate ERCP availability within the next 24 hours.    Discussed need for transfer with the patient, she requests [facility] as she has received the majority of her care there.    Discussed with [facility] medicine, Dr. Rosilyn Mings who accepts patient for transfer. They will plan for ERCP in AM. Greatly appreciate their assistance.   [SM]    Clinical Course User Index [SM] Miguel Aschoff., MD     _______________________________   As part of my medical decision making I have reviewed available labs, radiology tests, reviewed old records/performed chart review, obtained additional history from family, and discussed with consultants (GI).     Final Clinical Impression(s) / ED Diagnosis  Final diagnoses:  Hematuria, unspecified type  Fever in adult  Body aches  Chest pain in adult       Note:  This document was prepared using Dragon voice recognition software and may include unintentional dictation errors.   Miguel Aschoff., MD 04/01/20 [phone]

## 2020-04-01 NOTE — ED Notes (Signed)
Pt verbalized understanding for need to transport to [facility] for further evaluation. Pt signed for consent to transfer. Pt is in NAD at this time.

## 2020-04-02 LAB — URINE CULTURE: Culture: <10000 — AB

## 2020-06-06 ENCOUNTER — Encounter: Payer: Self-pay | Admitting: Emergency Medicine

## 2020-06-06 ENCOUNTER — Emergency Department
Admission: EM | Admit: 2020-06-06 | Discharge: 2020-06-06 | Disposition: A | Discharge status: Home / Self Care | Payer: Medicare Other | Attending: Emergency Medicine | Admitting: Emergency Medicine

## 2020-06-06 ENCOUNTER — Other Ambulatory Visit: Payer: Self-pay

## 2020-06-06 DIAGNOSIS — Z7982 Long term (current) use of aspirin: Secondary | ICD-10-CM | POA: Insufficient documentation

## 2020-06-06 DIAGNOSIS — Z91040 Latex allergy status: Secondary | ICD-10-CM | POA: Diagnosis not present

## 2020-06-06 DIAGNOSIS — I10 Essential (primary) hypertension: Secondary | ICD-10-CM | POA: Diagnosis not present

## 2020-06-06 DIAGNOSIS — K5641 Fecal impaction: Secondary | ICD-10-CM

## 2020-06-06 DIAGNOSIS — Z79899 Other long term (current) drug therapy: Secondary | ICD-10-CM | POA: Insufficient documentation

## 2020-06-06 DIAGNOSIS — I2510 Atherosclerotic heart disease of native coronary artery without angina pectoris: Secondary | ICD-10-CM | POA: Insufficient documentation

## 2020-06-06 DIAGNOSIS — Z96652 Presence of left artificial knee joint: Secondary | ICD-10-CM | POA: Diagnosis not present

## 2020-06-06 DIAGNOSIS — K5900 Constipation, unspecified: Secondary | ICD-10-CM | POA: Diagnosis not present

## 2020-06-06 DIAGNOSIS — K5901 Slow transit constipation: Secondary | ICD-10-CM

## 2020-06-06 LAB — COMPREHENSIVE METABOLIC PANEL
ALT: 15 U/L (ref 0–44)
AST: 17 U/L (ref 15–41)
Albumin: 3.1 g/dL — ABNORMAL LOW (ref 3.5–5.0)
Alkaline Phosphatase: 100 U/L (ref 38–126)
Anion gap: 10 (ref 5–15)
BUN: 21 mg/dL (ref 8–23)
CO2: 27 mmol/L (ref 22–32)
Calcium: 8.6 mg/dL — ABNORMAL LOW (ref 8.9–10.3)
Chloride: 102 mmol/L (ref 98–111)
Creatinine, Ser: 0.98 mg/dL (ref 0.44–1.00)
GFR calc Af Amer: >60 mL/min (ref >60)
GFR calc non Af Amer: 54 mL/min — ABNORMAL LOW (ref >60)
Glucose, Bld: 143 mg/dL — ABNORMAL HIGH (ref 70–99)
Potassium: 3.9 mmol/L (ref 3.5–5.1)
Sodium: 139 mmol/L (ref 135–145)
Total Bilirubin: 0.7 mg/dL (ref 0.3–1.2)
Total Protein: 6.4 g/dL — ABNORMAL LOW (ref 6.5–8.1)

## 2020-06-06 LAB — CBC
HCT: 31.6 % — ABNORMAL LOW (ref 36.0–46.0)
Hemoglobin: 10.2 g/dL — ABNORMAL LOW (ref 12.0–15.0)
MCH: 32.3 pg (ref 26.0–34.0)
MCHC: 32.3 g/dL (ref 30.0–36.0)
MCV: 100 fL (ref 80.0–100.0)
Platelets: 313 10*3/uL (ref 150–400)
RBC: 3.16 MIL/uL — ABNORMAL LOW (ref 3.87–5.11)
RDW: 18.4 % — ABNORMAL HIGH (ref 11.5–15.5)
WBC: 6.8 10*3/uL (ref 4.0–10.5)
nRBC: 0 % (ref 0.0–0.2)

## 2020-06-06 LAB — LIPASE, BLOOD: Lipase: 47 U/L (ref 11–51)

## 2020-06-06 MED ORDER — MAGNESIUM CITRATE PO SOLN
1.0000 | Freq: Once | ORAL | Status: AC
  Administered 2020-06-06: 1 via ORAL
  Filled 2020-06-06: qty 296

## 2020-06-06 MED ORDER — SODIUM CHLORIDE 0.9% FLUSH: 3.0000 mL | Freq: Once | INTRAVENOUS | Status: DC

## 2020-06-06 NOTE — ED Provider Notes (Signed)
[facility] Emergency Department Provider Note   ____________________________________________    I have reviewed the triage vital signs and the nursing notes.   HISTORY  Chief Complaint Constipation     HPI Casey Baldwin is a 79 y.o. female who presents with complaints of constipation.  Patient reports she has not had a bowel movement in 4 to 5 days.  She feels that stool is in her rectum but will come out.  She has tried multiple enemas unsuccessfully.  She denies nausea or vomiting or abdominal pain.  She reports a history of constipation in the past.  Has been taking laxatives unsuccessfully.  Past Medical History:  Diagnosis Date  . Coronary artery disease   . High cholesterol   . Hypertension     There are no problems to display for this patient.   Past Surgical History:  Procedure Laterality Date  . COLON RESECTION    . FACIAL COSMETIC SURGERY    . FRACTURE SURGERY     left leg and left ankle.  Marland Kitchen GASTRIC BYPASS    . left knee replacement      Prior to Admission medications   Medication Sig Start Date End Date Taking? Authorizing Provider  acetaminophen (TYLENOL) 325 MG tablet Take by mouth.    [provider]  Artificial Tear Ointment (DRY EYES OP) Place 1-2 drops into both eyes daily as needed.    [provider]  aspirin 81 MG EC tablet Take by mouth.    [provider]  aspirin-acetaminophen-caffeine (EXCEDRIN MIGRAINE) [phone] MG tablet Take 2 tablets by mouth every 6 (six) hours as needed for headache or migraine.    [provider]  atorvastatin (LIPITOR) 40 MG tablet Take 40 mg by mouth daily. 01/24/20   [provider]  Biotin 1 MG CAPS Take 1 capsule by mouth daily.    [provider]  bisacodyl (DULCOLAX) 5 MG EC tablet Take 5 mg by mouth daily as needed for moderate constipation.    [provider]  calcium carbonate (OS-CAL) 1250 (500 Ca) MG chewable tablet  Chew 2 tablets by mouth daily.    [provider]  Cholecalciferol (VITAMIN D PO) Take 1 tablet by mouth daily.    [provider]  Cyanocobalamin (B12 LIQUID HEALTH BOOSTER PO) Take 1 Dose by mouth daily.    [provider]  fluticasone (FLONASE) 50 MCG/ACT nasal spray Place into the nose. 02/10/20 02/09/21  [provider]  furosemide (LASIX) 20 MG tablet Take by mouth. 04/20/19   [provider]  isosorbide mononitrate (IMDUR) 30 MG 24 hr tablet Take 30 mg by mouth daily. 01/08/20   [provider]  losartan (COZAAR) 100 MG tablet Take 100 mg by mouth daily. 03/27/20   [provider]  Multiple Vitamin (MULTIVITAMIN WITH MINERALS) TABS tablet Take 1 tablet by mouth daily.    [provider]  Multiple Vitamins-Minerals (ICAPS AREDS 2 PO) Take 1 capsule by mouth daily.    [provider]  pantoprazole (PROTONIX) 20 MG tablet Take 20 mg by mouth daily. 02/22/20   [provider]  senna-docusate (SENOKOT-S) 8.6-50 MG tablet Take 2 tablets by mouth 2 (two) times daily. Patient not taking: Reported on 04/01/2020 11/21/19   Sharman Cheek, MD  witch hazel-glycerin (TUCKS) pad Apply 1 application topically as needed for itching. 11/21/19   Sharman Cheek, MD     Allergies Lidocaine, Amlodipine, Lactose, Lactose intolerance (gi), and Latex  No family  history on file.  Social History Social History   Tobacco Use  . Smoking status: Never Smoker  . Smokeless tobacco: Never Used  Vaping Use  . Vaping Use: Never used  Substance Use Topics  . Alcohol use: No  . Drug use: No    Review of Systems  Constitutional: No fever/chills  Cardiovascular: Denies chest pain. Respiratory: Denies shortness of breath. Gastrointestinal: As above     ____________________________________________   PHYSICAL EXAM:  VITAL SIGNS: ED Triage Vitals  Enc Vitals Group     BP 06/06/20 0717 (!) 111/62     Pulse Rate  06/06/20 0717 76     Resp 06/06/20 0717 16     Temp 06/06/20 0717 98.8 F (37.1 C)     Temp Source 06/06/20 0717 Oral     SpO2 06/06/20 0717 98 %     Weight 06/06/20 0718 88 kg (194 lb 0.1 oz)     Height 06/06/20 0718 1.626 m (5\' 4" )     Head Circumference --      Peak Flow --      Pain Score 06/06/20 0718 8     Pain Loc --      Pain Edu? --      Excl. in GC? --     Constitutional: Alert and oriented. No acute distress.    Cardiovascular: Normal rate, regular rhythm.  Good peripheral circulation. Respiratory: Normal respiratory effort.  No retractions.  Gastrointestinal: Soft and nontender. No distention.  No CVA tenderness.  Brown stool on rectal exam  Musculoskeletal: Warm and well perfused Neurologic:  Normal speech and language. No gross focal neurologic deficits are appreciated.  Skin:  Skin is warm, dry and intact. No rash noted. Psychiatric: Mood and affect are normal. Speech and behavior are normal.  ____________________________________________   LABS (all labs ordered are listed, but only abnormal results are displayed)  Labs Reviewed  COMPREHENSIVE METABOLIC PANEL - Abnormal; Notable for the following components:      Result Value   Glucose, Bld 143 (*)    Calcium 8.6 (*)    Total Protein 6.4 (*)    Albumin 3.1 (*)    GFR calc non Af Amer 54 (*)    All other components within normal limits  CBC - Abnormal; Notable for the following components:   RBC 3.16 (*)    Hemoglobin 10.2 (*)    HCT 31.6 (*)    RDW 18.4 (*)    All other components within normal limits  LIPASE, BLOOD  URINALYSIS, COMPLETE (UACMP) WITH MICROSCOPIC   ____________________________________________  EKG   ____________________________________________  RADIOLOGY   ____________________________________________   PROCEDURES  Procedure(s) performed: yes  ------------------------------------------------------------------------------------------------------------------- Fecal  Disimpaction Procedure Note:  Performed by me:  Patient placed in the lateral recumbent position with knees drawn towards chest. Nurse present for patient support. Large amount of hard brown stool removed. No complications during procedure.   ------------------------------------------------------------------------------------------------------------------    Procedures   Critical Care performed: No ____________________________________________   INITIAL IMPRESSION / ASSESSMENT AND PLAN / ED COURSE  Pertinent labs & imaging results that were available during my care of the patient were reviewed by me and considered in my medical decision making (see chart for details).  Patient presents with complaints of constipation.  Differential includes fecal impaction, slow transit constipation, medication related constipation  Lab work today is quite reassuring, normal white blood cell count, normal chemistries.  Abdominal exam is benign.  Disimpaction performed successfully although patient tolerated poorly.  Magnesium  citrate given.  Recommend continued laxatives at home until bowel movement.    ____________________________________________   FINAL CLINICAL IMPRESSION(S) / ED DIAGNOSES  Final diagnoses:  Constipation by delayed colonic transit  Fecal impaction in rectum [facility])        Note:  This document was prepared using Dragon voice recognition software and may include unintentional dictation errors.   Jene Every, MD 06/06/20 [phone]

## 2020-06-06 NOTE — ED Notes (Signed)
She did have a small amount of stool on commode--brown soft.  Patient still complains of pain in rectum especially with sitting.  She is in bed now.  She is drinking the mag citrate.

## 2020-06-06 NOTE — ED Triage Notes (Signed)
C/O constipation.  Last BM 5 days ago.  Patient has history of constipation.    AAOx3.  Skin warm and dry. NAD

## 2020-07-08 ENCOUNTER — Emergency Department: Payer: Medicare Other

## 2020-07-08 ENCOUNTER — Encounter: Payer: Self-pay | Admitting: Emergency Medicine

## 2020-07-08 ENCOUNTER — Other Ambulatory Visit: Payer: Self-pay

## 2020-07-08 DIAGNOSIS — M79605 Pain in left leg: Secondary | ICD-10-CM | POA: Diagnosis not present

## 2020-07-08 DIAGNOSIS — R509 Fever, unspecified: Secondary | ICD-10-CM | POA: Diagnosis not present

## 2020-07-08 DIAGNOSIS — R519 Headache, unspecified: Secondary | ICD-10-CM | POA: Insufficient documentation

## 2020-07-08 DIAGNOSIS — Z5321 Procedure and treatment not carried out due to patient leaving prior to being seen by health care provider: Secondary | ICD-10-CM | POA: Insufficient documentation

## 2020-07-08 DIAGNOSIS — R079 Chest pain, unspecified: Secondary | ICD-10-CM | POA: Diagnosis not present

## 2020-07-08 DIAGNOSIS — M79604 Pain in right leg: Secondary | ICD-10-CM | POA: Diagnosis not present

## 2020-07-08 DIAGNOSIS — Z20822 Contact with and (suspected) exposure to covid-19: Secondary | ICD-10-CM | POA: Insufficient documentation

## 2020-07-08 LAB — BASIC METABOLIC PANEL
Anion gap: 8 (ref 5–15)
BUN: 13 mg/dL (ref 8–23)
CO2: 23 mmol/L (ref 22–32)
Calcium: 8.4 mg/dL — ABNORMAL LOW (ref 8.9–10.3)
Chloride: 102 mmol/L (ref 98–111)
Creatinine, Ser: 0.94 mg/dL (ref 0.44–1.00)
GFR calc Af Amer: >60 mL/min (ref >60)
GFR calc non Af Amer: 57 mL/min — ABNORMAL LOW (ref >60)
Glucose, Bld: 128 mg/dL — ABNORMAL HIGH (ref 70–99)
Potassium: 3.7 mmol/L (ref 3.5–5.1)
Sodium: 133 mmol/L — ABNORMAL LOW (ref 135–145)

## 2020-07-08 LAB — CBC
HCT: 30.4 % — ABNORMAL LOW (ref 36.0–46.0)
Hemoglobin: 9.8 g/dL — ABNORMAL LOW (ref 12.0–15.0)
MCH: 32 pg (ref 26.0–34.0)
MCHC: 32.2 g/dL (ref 30.0–36.0)
MCV: 99.3 fL (ref 80.0–100.0)
Platelets: 261 10*3/uL (ref 150–400)
RBC: 3.06 MIL/uL — ABNORMAL LOW (ref 3.87–5.11)
RDW: 14.9 % (ref 11.5–15.5)
WBC: 8 10*3/uL (ref 4.0–10.5)
nRBC: 0 % (ref 0.0–0.2)

## 2020-07-08 LAB — TROPONIN I (HIGH SENSITIVITY): Troponin I (High Sensitivity): 6 ng/L (ref <18)

## 2020-07-08 NOTE — ED Triage Notes (Signed)
Patient with complaint of chest pressure, bilateral leg pain, headache and fever that started about 02:00 this am. Patient states that her temperature was 102.2 at home.

## 2020-07-09 ENCOUNTER — Emergency Department
Admission: EM | Admit: 2020-07-09 | Discharge: 2020-07-09 | Disposition: A | Discharge status: Home / Self Care | Payer: Medicare Other | Attending: Emergency Medicine | Admitting: Emergency Medicine

## 2020-07-09 LAB — LACTIC ACID, PLASMA: Lactic Acid, Venous: 0.5 mmol/L (ref 0.5–1.9)

## 2020-07-09 LAB — SARS CORONAVIRUS 2 BY RT PCR (HOSPITAL ORDER, PERFORMED IN ~~LOC~~ HOSPITAL LAB): SARS Coronavirus 2: NEGATIVE

## 2020-07-09 LAB — TROPONIN I (HIGH SENSITIVITY): Troponin I (High Sensitivity): 7 ng/L (ref <18)

## 2020-07-09 MED ORDER — IBUPROFEN 400 MG PO TABS
400.0000 mg | ORAL_TABLET | Freq: Once | ORAL | Status: AC | PRN
  Administered 2020-07-09: 400 mg via ORAL
  Filled 2020-07-09: qty 1

## 2020-07-09 NOTE — ED Triage Notes (Signed)
Pt called for reassessment and VS check, no response. 

## 2020-07-09 NOTE — ED Notes (Signed)
Pt called for reassessment and VS check, no response. 

## 2020-07-09 NOTE — ED Notes (Signed)
Pt called x's 3, no response ?

## 2020-07-10 ENCOUNTER — Emergency Department: Payer: Medicare Other

## 2020-07-10 ENCOUNTER — Encounter: Payer: Self-pay | Admitting: Emergency Medicine

## 2020-07-10 ENCOUNTER — Emergency Department
Admission: EM | Admit: 2020-07-10 | Discharge: 2020-07-10 | Disposition: A | Discharge status: Home / Self Care | Payer: Medicare Other | Source: Home / Self Care | Attending: Emergency Medicine | Admitting: Emergency Medicine

## 2020-07-10 ENCOUNTER — Other Ambulatory Visit: Payer: Self-pay

## 2020-07-10 DIAGNOSIS — Z96652 Presence of left artificial knee joint: Secondary | ICD-10-CM | POA: Insufficient documentation

## 2020-07-10 DIAGNOSIS — R0602 Shortness of breath: Secondary | ICD-10-CM

## 2020-07-10 DIAGNOSIS — I10 Essential (primary) hypertension: Secondary | ICD-10-CM | POA: Insufficient documentation

## 2020-07-10 DIAGNOSIS — M79604 Pain in right leg: Secondary | ICD-10-CM | POA: Insufficient documentation

## 2020-07-10 DIAGNOSIS — M79605 Pain in left leg: Secondary | ICD-10-CM | POA: Insufficient documentation

## 2020-07-10 DIAGNOSIS — R7989 Other specified abnormal findings of blood chemistry: Secondary | ICD-10-CM

## 2020-07-10 DIAGNOSIS — I2510 Atherosclerotic heart disease of native coronary artery without angina pectoris: Secondary | ICD-10-CM

## 2020-07-10 DIAGNOSIS — R700 Elevated erythrocyte sedimentation rate: Secondary | ICD-10-CM

## 2020-07-10 DIAGNOSIS — Z91040 Latex allergy status: Secondary | ICD-10-CM | POA: Insufficient documentation

## 2020-07-10 DIAGNOSIS — Z20822 Contact with and (suspected) exposure to covid-19: Secondary | ICD-10-CM | POA: Insufficient documentation

## 2020-07-10 DIAGNOSIS — Z79899 Other long term (current) drug therapy: Secondary | ICD-10-CM | POA: Insufficient documentation

## 2020-07-10 DIAGNOSIS — Z7982 Long term (current) use of aspirin: Secondary | ICD-10-CM | POA: Insufficient documentation

## 2020-07-10 DIAGNOSIS — R791 Abnormal coagulation profile: Secondary | ICD-10-CM | POA: Insufficient documentation

## 2020-07-10 LAB — CBC WITH DIFFERENTIAL/PLATELET
Abs Immature Granulocytes: 0.02 10*3/uL (ref 0.00–0.07)
Basophils Absolute: 0 10*3/uL (ref 0.0–0.1)
Basophils Relative: 0 %
Eosinophils Absolute: 0 10*3/uL (ref 0.0–0.5)
Eosinophils Relative: 1 %
HCT: 31.7 % — ABNORMAL LOW (ref 36.0–46.0)
Hemoglobin: 10.2 g/dL — ABNORMAL LOW (ref 12.0–15.0)
Immature Granulocytes: 0 %
Lymphocytes Relative: 12 %
Lymphs Abs: 0.9 10*3/uL (ref 0.7–4.0)
MCH: 32.2 pg (ref 26.0–34.0)
MCHC: 32.2 g/dL (ref 30.0–36.0)
MCV: 100 fL (ref 80.0–100.0)
Monocytes Absolute: 0.7 10*3/uL (ref 0.1–1.0)
Monocytes Relative: 9 %
Neutro Abs: 5.5 10*3/uL (ref 1.7–7.7)
Neutrophils Relative %: 78 %
Platelets: 258 10*3/uL (ref 150–400)
RBC: 3.17 MIL/uL — ABNORMAL LOW (ref 3.87–5.11)
RDW: 14.6 % (ref 11.5–15.5)
WBC: 7.2 10*3/uL (ref 4.0–10.5)
nRBC: 0 % (ref 0.0–0.2)

## 2020-07-10 LAB — COMPREHENSIVE METABOLIC PANEL
ALT: 15 U/L (ref 0–44)
AST: 16 U/L (ref 15–41)
Albumin: 3.8 g/dL (ref 3.5–5.0)
Alkaline Phosphatase: 61 U/L (ref 38–126)
Anion gap: 8 (ref 5–15)
BUN: 12 mg/dL (ref 8–23)
CO2: 25 mmol/L (ref 22–32)
Calcium: 8.7 mg/dL — ABNORMAL LOW (ref 8.9–10.3)
Chloride: 100 mmol/L (ref 98–111)
Creatinine, Ser: 1.02 mg/dL — ABNORMAL HIGH (ref 0.44–1.00)
GFR calc Af Amer: >60 mL/min (ref >60)
GFR calc non Af Amer: 52 mL/min — ABNORMAL LOW (ref >60)
Glucose, Bld: 171 mg/dL — ABNORMAL HIGH (ref 70–99)
Potassium: 3.6 mmol/L (ref 3.5–5.1)
Sodium: 133 mmol/L — ABNORMAL LOW (ref 135–145)
Total Bilirubin: 1.1 mg/dL (ref 0.3–1.2)
Total Protein: 7.1 g/dL (ref 6.5–8.1)

## 2020-07-10 LAB — SARS CORONAVIRUS 2 BY RT PCR (HOSPITAL ORDER, PERFORMED IN ~~LOC~~ HOSPITAL LAB): SARS Coronavirus 2: NEGATIVE

## 2020-07-10 LAB — TROPONIN I (HIGH SENSITIVITY)
Troponin I (High Sensitivity): 10 ng/L (ref <18)
Troponin I (High Sensitivity): 8 ng/L (ref <18)

## 2020-07-10 LAB — BRAIN NATRIURETIC PEPTIDE: B Natriuretic Peptide: 212.1 pg/mL — ABNORMAL HIGH (ref 0.0–100.0)

## 2020-07-10 LAB — CK: Total CK: 90 U/L (ref 38–234)

## 2020-07-10 LAB — FIBRIN DERIVATIVES D-DIMER (ARMC ONLY): Fibrin derivatives D-dimer (ARMC): 1077 ng/mL (FEU) — ABNORMAL HIGH (ref 0.00–499.00)

## 2020-07-10 LAB — SEDIMENTATION RATE: Sed Rate: 32 mm/hr — ABNORMAL HIGH (ref 0–30)

## 2020-07-10 MED ORDER — MORPHINE SULFATE (PF) 4 MG/ML IV SOLN
4.0000 mg | Freq: Once | INTRAVENOUS | Status: AC
  Administered 2020-07-10: 4 mg via INTRAVENOUS
  Filled 2020-07-10: qty 1

## 2020-07-10 MED ORDER — IBUPROFEN 600 MG PO TABS: 600.0000 mg | ORAL_TABLET | Freq: Once | ORAL | Status: DC

## 2020-07-10 MED ORDER — ACETAMINOPHEN 500 MG PO TABS
1000.0000 mg | ORAL_TABLET | Freq: Four times a day (QID) | ORAL | Status: DC | PRN
  Administered 2020-07-10: 1000 mg via ORAL
  Filled 2020-07-10: qty 2

## 2020-07-10 MED ORDER — ONDANSETRON HCL 4 MG/2ML IJ SOLN
4.0000 mg | Freq: Once | INTRAMUSCULAR | Status: AC
  Administered 2020-07-10: 4 mg via INTRAVENOUS
  Filled 2020-07-10: qty 2

## 2020-07-10 MED ORDER — ISOSORBIDE MONONITRATE ER 60 MG PO TB24
30.0000 mg | ORAL_TABLET | Freq: Every day | ORAL | Status: DC
  Administered 2020-07-10: 30 mg via ORAL
  Filled 2020-07-10: qty 1

## 2020-07-10 MED ORDER — IOHEXOL 350 MG/ML SOLN
75.0000 mL | Freq: Once | INTRAVENOUS | Status: AC | PRN
  Administered 2020-07-10: 75 mL via INTRAVENOUS

## 2020-07-10 NOTE — ED Notes (Signed)
This RN spoke with pt's daughter, Darl Pikes, to update on POC

## 2020-07-10 NOTE — ED Provider Notes (Signed)
[facility] Emergency Department Provider Note   ____________________________________________   First MD Initiated Contact with Patient 07/10/20 1442     (approximate)  I have reviewed the triage vital signs and the nursing notes.   HISTORY  Chief Complaint Leg Pain and Shortness of Breath    HPI Casey Baldwin is a 79 y.o. female complains of aching everywhere and feeling short of breath.  She is not coughing.  She has not been running a fever.  She Dace does say that her legs from the knee down more the worst part of the pain.  The pain is been getting worse slowly.  She also added that her gallbladder was taken out 10 weeks ago.  Her shortness of breath is worse on exertion but it is there all the time.         Past Medical History:  Diagnosis Date   Coronary artery disease    High cholesterol    Hypertension     There are no problems to display for this patient.   Past Surgical History:  Procedure Laterality Date   ABDOMINAL HYSTERECTOMY     COLON RESECTION     FACIAL COSMETIC SURGERY     FRACTURE SURGERY     left leg and left ankle.   GASTRIC BYPASS     left knee replacement      Prior to Admission medications   Medication Sig Start Date End Date Taking? Authorizing Provider  aspirin 81 MG EC tablet Take 81 mg by mouth daily.    Yes [provider]  bisacodyl (DULCOLAX) 5 MG EC tablet Take 10 mg by mouth at bedtime.    Yes [provider]  calcium carbonate (OS-CAL) 1250 (500 Ca) MG chewable tablet Chew 2 tablets by mouth daily.   Yes [provider]  Cholecalciferol (VITAMIN D PO) Take 1 tablet by mouth daily.   Yes [provider]  furosemide (LASIX) 20 MG tablet Take 20-40 mg by mouth daily. CAN TAKE 40MG  FOR SWELLING 04/20/19  Yes [provider]  isosorbide mononitrate (IMDUR) 30 MG 24 hr tablet Take 30 mg by mouth daily. 01/08/20  Yes [provider]  Multiple Vitamin  (MULTIVITAMIN WITH MINERALS) TABS tablet Take 1 tablet by mouth daily.   Yes [provider]  Multiple Vitamins-Minerals (ICAPS AREDS 2 PO) Take 1 capsule by mouth daily.   Yes [provider]  pantoprazole (PROTONIX) 20 MG tablet Take 20 mg by mouth daily. 02/22/20  Yes [provider]  rosuvastatin (CRESTOR) 20 MG tablet Take 20 mg by mouth daily. 06/19/20  Yes [provider]  Acetaminophen 500 MG capsule Take 500 mg by mouth daily as needed.     [provider]  Artificial Tear Ointment (DRY EYES OP) Place 1-2 drops into both eyes daily as needed.    [provider]  ondansetron (ZOFRAN) 4 MG tablet Take 4 mg by mouth daily as needed. 05/24/20   [provider]  senna-docusate (SENOKOT-S) 8.6-50 MG tablet Take 2 tablets by mouth 2 (two) times daily. Patient not taking: Reported on 04/01/2020 11/21/19   01/19/20, MD  witch hazel-glycerin (TUCKS) pad Apply 1 application topically as needed for itching. Patient not taking: Reported on 07/10/2020 11/21/19   01/19/20, MD    Allergies Lidocaine, Amlodipine, Lactose, Lactose intolerance (gi), and Latex  History reviewed. No pertinent family history.  Social History Social History   Tobacco Use   Smoking status: Never Smoker  Smokeless tobacco: Never Used  Vaping Use   Vaping Use: Never used  Substance Use Topics   Alcohol use: No   Drug use: No    Review of Systems  Constitutional: No fever/chills Eyes: No visual changes. ENT: No sore throat. Cardiovascular: Denies chest pain. Respiratory:  shortness of breath. Gastrointestinal: No abdominal pain.  No nausea, no vomiting.  No diarrhea.  No constipation. Genitourinary: Negative for dysuria. Musculoskeletal: Negative for back pain. Skin: Negative for rash. Neurological: Negative for headaches, focal weakness   ____________________________________________   PHYSICAL EXAM:  VITAL SIGNS: ED  Triage Vitals  Enc Vitals Group     BP 07/10/20 0729 (!) 138/48     Pulse Rate 07/10/20 0729 72     Resp 07/10/20 0729 18     Temp 07/10/20 0729 98.7 F (37.1 C)     Temp Source 07/10/20 0729 Oral     SpO2 07/10/20 0729 97 %     Weight 07/10/20 0729 175 lb (79.4 kg)     Height 07/10/20 0729 5\' 4"  (1.626 m)     Head Circumference --      Peak Flow --      Pain Score 07/10/20 0742 9     Pain Loc --      Pain Edu? --      Excl. in GC? --     Constitutional: Alert and oriented. Well appearing and in no acute distress. Eyes: Conjunctivae are normal. PER EOMI. Head: Atraumatic. Nose: No congestion/rhinnorhea. Mouth/Throat: Mucous membranes are moist.  Oropharynx non-erythematous. Neck: No stridor.   Cardiovascular: Normal rate, regular rhythm. Grossly normal heart sounds.  Good peripheral circulation. Respiratory: Normal respiratory effort.  No retractions. Lungs CTAB. Gastrointestinal: Soft and nontender. No distention. No abdominal bruits Musculoskeletal: No lower extremity tenderness trace bilateral edema.   Neurologic:  Normal speech and language. No gross focal neurologic deficits are appreciated. Skin:  Skin is warm, dry and intact. No rash noted.  ____________________________________________   LABS (all labs ordered are listed, but only abnormal results are displayed)  Labs Reviewed  COMPREHENSIVE METABOLIC PANEL - Abnormal; Notable for the following components:      Result Value   Sodium 133 (*)    Glucose, Bld 171 (*)    Creatinine, Ser 1.02 (*)    Calcium 8.7 (*)    GFR calc non Af Amer 52 (*)    All other components within normal limits  BRAIN NATRIURETIC PEPTIDE - Abnormal; Notable for the following components:   B Natriuretic Peptide 212.1 (*)    All other components within normal limits  CBC WITH DIFFERENTIAL/PLATELET - Abnormal; Notable for the following components:   RBC 3.17 (*)    Hemoglobin 10.2 (*)    HCT 31.7 (*)    All other components within  normal limits  FIBRIN DERIVATIVES D-DIMER ([facility] ONLY) - Abnormal; Notable for the following components:   Fibrin derivatives D-dimer ([facility]) 1,077.00 (*)    All other components within normal limits  SARS CORONAVIRUS 2 BY RT PCR (HOSPITAL ORDER, PERFORMED IN [facility])  CK  TROPONIN I (HIGH SENSITIVITY)  TROPONIN I (HIGH SENSITIVITY)   ____________________________________________  EKG  EKG done at 8:00 shows normal sinus rhythm rate of 73 left axis decreased R wave progression flipped T and lead L _EKG #2 and labeled in 1 place as being done on August 29 and another place being done on August 31 shows normal sinus rhythm at 73 left axis decreased R wave progression  but an upright T in aVL.  ___________________________________________  RADIOLOGY  ED MD interpretation: Chest x-ray read by radiology reviewed by me does not show any acute disease Doppler of the legs read by radiology reviewed by me does not show any DVTs  Official radiology report(s): DG Chest 2 View  Result Date: 07/10/2020 CLINICAL DATA:  Shortness of breath. EXAM: CHEST - 2 VIEW COMPARISON:  July 08, 2020. FINDINGS: The heart size and mediastinal contours are within normal limits. Both lungs are clear. No pneumothorax or pleural effusion is noted. The visualized skeletal structures are unremarkable. IMPRESSION: No active cardiopulmonary disease. Aortic Atherosclerosis (ICD10-I70.0). Electronically Signed   By: Lupita RaiderJames  Green Jr M.D.   On: 07/10/2020 08:25   US Venous Img Lower Bilateral  Result Date: 07/10/2020 CLINICAL DATA:  Bilateral lower leg pain shortness of breath EXAM: BILATERAL LOWER EXTREMITY VENOUS DOPPLER ULTRASOUND TECHNIQUE: Gray-scale sonography with graded compression, as well as color Doppler and duplex ultrasound were performed to evaluate the lower extremity deep venous systems from the level of the common femoral vein and including the common femoral, femoral, profunda femoral, popliteal  and calf veins including the posterior tibial, peroneal and gastrocnemius veins when visible. The superficial great saphenous vein was also interrogated. Spectral Doppler was utilized to evaluate flow at rest and with distal augmentation maneuvers in the common femoral, femoral and popliteal veins. COMPARISON:  None. FINDINGS: RIGHT LOWER EXTREMITY Common Femoral Vein: No evidence of thrombus. Normal compressibility, respiratory phasicity and response to augmentation. Saphenofemoral Junction: No evidence of thrombus. Normal compressibility and flow on color Doppler imaging. Profunda Femoral Vein: No evidence of thrombus. Normal compressibility and flow on color Doppler imaging. Femoral Vein: No evidence of thrombus. Normal compressibility, respiratory phasicity and response to augmentation. Popliteal Vein: No evidence of thrombus. Normal compressibility, respiratory phasicity and response to augmentation. Calf Veins: No evidence of thrombus. Normal compressibility and flow on color Doppler imaging. Superficial Great Saphenous Vein: No evidence of thrombus. Normal compressibility. Venous Reflux:  None. Other Findings:  None. LEFT LOWER EXTREMITY Common Femoral Vein: No evidence of thrombus. Normal compressibility, respiratory phasicity and response to augmentation. Saphenofemoral Junction: No evidence of thrombus. Normal compressibility and flow on color Doppler imaging. Profunda Femoral Vein: No evidence of thrombus. Normal compressibility and flow on color Doppler imaging. Femoral Vein: No evidence of thrombus. Normal compressibility, respiratory phasicity and response to augmentation. Popliteal Vein: No evidence of thrombus. Normal compressibility, respiratory phasicity and response to augmentation. Calf Veins: No evidence of thrombus. Normal compressibility and flow on color Doppler imaging. Superficial Great Saphenous Vein: No evidence of thrombus. Normal compressibility. Venous Reflux:  None. Other Findings:   None. IMPRESSION: No evidence of deep venous thrombosis in either lower extremity. Electronically Signed   By: Duanne GuessNicholas  Plundo D.O.   On: 07/10/2020 10:19    ____________________________________________   PROCEDURES  Procedure(s) performed (including Critical Care):  Procedures   ____________________________________________   INITIAL IMPRESSION / ASSESSMENT AND PLAN / ED COURSE  Patient with shortness of breath leg pain and aching all over we are waiting on the SARS test and in the meantime because of her high D-dimer I will do a CT angio of the chest.  I will be signing the patient out to oncoming provider.             ____________________________________________   FINAL CLINICAL IMPRESSION(S) / ED DIAGNOSES  Final diagnoses:  Shortness of breath     ED Discharge Orders    None  Note:  This document was prepared using Dragon voice recognition software and may include unintentional dictation errors.    Arnaldo Natal, MD 07/10/20 [phone]

## 2020-07-10 NOTE — ED Notes (Signed)
Placed purewick on pt. 

## 2020-07-10 NOTE — ED Provider Notes (Signed)
I assumed care of this patient at approximately 1320.  Please see upon providers note above for full details regarding patient's initial evaluation assessment.  In brief patient presents from home for assessment of several days of shortness of breath as well as bilateral leg and knee pain that has been getting progressively worse.  Initial EKG, troponin, chest x-ray, and labs reassuring.  Plan is to follow-up CTA chest given elevated D-dimer.  In addition we will plan to follow-up repeat troponin and reassess patient's respiratory status.  Covid PCR is in process.  CTA chest obtained shows no evidence of PE, pneumonia, or other acute intrathoracic process.  It does show evidence of coronary atherosclerosis.  Repeat troponin is nonelevated.  In addition I will plan prior ordered bilateral x-rays and ESR.  X-rays of the bilateral tibia and fibula are unremarkable.  It seems on my assessment patient's pain is comes and goes is she does not appear focally tender anywhere.  ESR is slightly elevated although is unclear the significance of this is there is no obvious foci of infection on exam she has no fever or elevation of her white blood cell count.  Given stable vital signs with otherwise reassuring work-up I do believe safe for discharge with plan for close outpatient PCP follow-up.  Emphasized to the patient importance of seeing her primary care doctor in the next 1 to 3 days.  Patient voiced understanding and agreement this plan.  Discharged stable condition.  Medications  acetaminophen (TYLENOL) tablet 1,000 mg (1,000 mg Oral Given 07/10/20 1647)  isosorbide mononitrate (IMDUR) 24 hr tablet 30 mg (30 mg Oral Given 07/10/20 1841)  iohexol (OMNIPAQUE) 350 MG/ML injection 75 mL (75 mLs Intravenous Contrast Given 07/10/20 1531)  morphine 4 MG/ML injection 4 mg (4 mg Intravenous Given 07/10/20 1647)  ondansetron (ZOFRAN) injection 4 mg (4 mg Intravenous Given 07/10/20 1647)     Medications  iohexol (OMNIPAQUE)  350 MG/ML injection 75 mL (has no administration in time range)      Lucrezia Starch, MD 07/10/20 [phone]

## 2020-07-10 NOTE — ED Notes (Signed)
Pt assisted to the bathroom.

## 2020-07-10 NOTE — ED Triage Notes (Signed)
Pt from home via POV.  Pt st BI leg pain "from knee down it feels like they are going to explode". Pain started a week ago, pain has gotten progressively worse.   Pt st having gallblader removed 10 weeks. Pt is not on blood thinners.St feeling SHOB on exertion.   Pt A/Ox4.

## 2020-07-12 ENCOUNTER — Emergency Department: Payer: Medicare Other

## 2020-07-12 ENCOUNTER — Inpatient Hospital Stay
Admission: EM | Admit: 2020-07-12 | Discharge: 2020-08-24 | DRG: 871 | Disposition: A | Discharge status: Hospice | Payer: Medicare Other | Attending: Internal Medicine | Admitting: Internal Medicine

## 2020-07-12 ENCOUNTER — Other Ambulatory Visit: Payer: Self-pay

## 2020-07-12 ENCOUNTER — Encounter: Payer: Self-pay | Admitting: Emergency Medicine

## 2020-07-12 DIAGNOSIS — R0989 Other specified symptoms and signs involving the circulatory and respiratory systems: Secondary | ICD-10-CM

## 2020-07-12 DIAGNOSIS — R579 Shock, unspecified: Secondary | ICD-10-CM | POA: Diagnosis present

## 2020-07-12 DIAGNOSIS — M25579 Pain in unspecified ankle and joints of unspecified foot: Secondary | ICD-10-CM

## 2020-07-12 DIAGNOSIS — R140 Abdominal distension (gaseous): Secondary | ICD-10-CM

## 2020-07-12 DIAGNOSIS — Z9884 Bariatric surgery status: Secondary | ICD-10-CM

## 2020-07-12 DIAGNOSIS — N390 Urinary tract infection, site not specified: Secondary | ICD-10-CM | POA: Diagnosis present

## 2020-07-12 DIAGNOSIS — R600 Localized edema: Secondary | ICD-10-CM

## 2020-07-12 DIAGNOSIS — R319 Hematuria, unspecified: Secondary | ICD-10-CM

## 2020-07-12 DIAGNOSIS — Z20822 Contact with and (suspected) exposure to covid-19: Secondary | ICD-10-CM | POA: Diagnosis present

## 2020-07-12 DIAGNOSIS — R1310 Dysphagia, unspecified: Secondary | ICD-10-CM | POA: Diagnosis present

## 2020-07-12 DIAGNOSIS — D75839 Thrombocytosis, unspecified: Secondary | ICD-10-CM

## 2020-07-12 DIAGNOSIS — Z0189 Encounter for other specified special examinations: Secondary | ICD-10-CM

## 2020-07-12 DIAGNOSIS — Z9119 Patient's noncompliance with other medical treatment and regimen: Secondary | ICD-10-CM

## 2020-07-12 DIAGNOSIS — R1011 Right upper quadrant pain: Secondary | ICD-10-CM

## 2020-07-12 DIAGNOSIS — D509 Iron deficiency anemia, unspecified: Secondary | ICD-10-CM | POA: Diagnosis present

## 2020-07-12 DIAGNOSIS — R410 Disorientation, unspecified: Secondary | ICD-10-CM | POA: Diagnosis present

## 2020-07-12 DIAGNOSIS — Z91040 Latex allergy status: Secondary | ICD-10-CM

## 2020-07-12 DIAGNOSIS — I471 Supraventricular tachycardia: Secondary | ICD-10-CM

## 2020-07-12 DIAGNOSIS — N179 Acute kidney failure, unspecified: Secondary | ICD-10-CM | POA: Diagnosis present

## 2020-07-12 DIAGNOSIS — Z6830 Body mass index (BMI) 30.0-30.9, adult: Secondary | ICD-10-CM

## 2020-07-12 DIAGNOSIS — F32A Depression, unspecified: Secondary | ICD-10-CM | POA: Diagnosis present

## 2020-07-12 DIAGNOSIS — E785 Hyperlipidemia, unspecified: Secondary | ICD-10-CM | POA: Diagnosis present

## 2020-07-12 DIAGNOSIS — R6510 Systemic inflammatory response syndrome (SIRS) of non-infectious origin without acute organ dysfunction: Secondary | ICD-10-CM

## 2020-07-12 DIAGNOSIS — Z79899 Other long term (current) drug therapy: Secondary | ICD-10-CM

## 2020-07-12 DIAGNOSIS — R6520 Severe sepsis without septic shock: Secondary | ICD-10-CM | POA: Diagnosis present

## 2020-07-12 DIAGNOSIS — E440 Moderate protein-calorie malnutrition: Secondary | ICD-10-CM | POA: Diagnosis present

## 2020-07-12 DIAGNOSIS — Z978 Presence of other specified devices: Secondary | ICD-10-CM

## 2020-07-12 DIAGNOSIS — A858 Other specified viral encephalitis: Secondary | ICD-10-CM | POA: Diagnosis present

## 2020-07-12 DIAGNOSIS — D75838 Other thrombocytosis: Secondary | ICD-10-CM | POA: Diagnosis not present

## 2020-07-12 DIAGNOSIS — E739 Lactose intolerance, unspecified: Secondary | ICD-10-CM | POA: Diagnosis present

## 2020-07-12 DIAGNOSIS — Z888 Allergy status to other drugs, medicaments and biological substances status: Secondary | ICD-10-CM

## 2020-07-12 DIAGNOSIS — G4733 Obstructive sleep apnea (adult) (pediatric): Secondary | ICD-10-CM | POA: Diagnosis present

## 2020-07-12 DIAGNOSIS — I2510 Atherosclerotic heart disease of native coronary artery without angina pectoris: Secondary | ICD-10-CM | POA: Diagnosis present

## 2020-07-12 DIAGNOSIS — B029 Zoster without complications: Secondary | ICD-10-CM | POA: Diagnosis present

## 2020-07-12 DIAGNOSIS — A86 Unspecified viral encephalitis: Secondary | ICD-10-CM

## 2020-07-12 DIAGNOSIS — D649 Anemia, unspecified: Secondary | ICD-10-CM | POA: Diagnosis present

## 2020-07-12 DIAGNOSIS — T85598A Other mechanical complication of other gastrointestinal prosthetic devices, implants and grafts, initial encounter: Secondary | ICD-10-CM

## 2020-07-12 DIAGNOSIS — I5032 Chronic diastolic (congestive) heart failure: Secondary | ICD-10-CM | POA: Diagnosis present

## 2020-07-12 DIAGNOSIS — R509 Fever, unspecified: Secondary | ICD-10-CM | POA: Diagnosis not present

## 2020-07-12 DIAGNOSIS — Z4659 Encounter for fitting and adjustment of other gastrointestinal appliance and device: Secondary | ICD-10-CM

## 2020-07-12 DIAGNOSIS — A419 Sepsis, unspecified organism: Principal | ICD-10-CM | POA: Diagnosis present

## 2020-07-12 DIAGNOSIS — I110 Hypertensive heart disease with heart failure: Secondary | ICD-10-CM | POA: Diagnosis present

## 2020-07-12 DIAGNOSIS — Z884 Allergy status to anesthetic agent status: Secondary | ICD-10-CM

## 2020-07-12 DIAGNOSIS — R338 Other retention of urine: Secondary | ICD-10-CM

## 2020-07-12 DIAGNOSIS — Z903 Acquired absence of stomach [part of]: Secondary | ICD-10-CM

## 2020-07-12 DIAGNOSIS — Z9049 Acquired absence of other specified parts of digestive tract: Secondary | ICD-10-CM

## 2020-07-12 DIAGNOSIS — R9389 Abnormal findings on diagnostic imaging of other specified body structures: Secondary | ICD-10-CM

## 2020-07-12 DIAGNOSIS — E669 Obesity, unspecified: Secondary | ICD-10-CM | POA: Diagnosis present

## 2020-07-12 DIAGNOSIS — Z515 Encounter for palliative care: Secondary | ICD-10-CM

## 2020-07-12 DIAGNOSIS — G9340 Encephalopathy, unspecified: Secondary | ICD-10-CM

## 2020-07-12 DIAGNOSIS — R404 Transient alteration of awareness: Secondary | ICD-10-CM

## 2020-07-12 DIAGNOSIS — I10 Essential (primary) hypertension: Secondary | ICD-10-CM | POA: Diagnosis present

## 2020-07-12 DIAGNOSIS — E119 Type 2 diabetes mellitus without complications: Secondary | ICD-10-CM | POA: Diagnosis present

## 2020-07-12 DIAGNOSIS — R627 Adult failure to thrive: Secondary | ICD-10-CM | POA: Diagnosis present

## 2020-07-12 DIAGNOSIS — Z66 Do not resuscitate: Secondary | ICD-10-CM | POA: Diagnosis not present

## 2020-07-12 DIAGNOSIS — R339 Retention of urine, unspecified: Secondary | ICD-10-CM | POA: Diagnosis not present

## 2020-07-12 DIAGNOSIS — R10819 Abdominal tenderness, unspecified site: Secondary | ICD-10-CM

## 2020-07-12 DIAGNOSIS — R630 Anorexia: Secondary | ICD-10-CM

## 2020-07-12 DIAGNOSIS — G9341 Metabolic encephalopathy: Secondary | ICD-10-CM | POA: Diagnosis present

## 2020-07-12 DIAGNOSIS — R188 Other ascites: Secondary | ICD-10-CM

## 2020-07-12 DIAGNOSIS — Z7982 Long term (current) use of aspirin: Secondary | ICD-10-CM

## 2020-07-12 DIAGNOSIS — B010 Varicella meningitis: Secondary | ICD-10-CM | POA: Diagnosis present

## 2020-07-12 DIAGNOSIS — R4182 Altered mental status, unspecified: Secondary | ICD-10-CM

## 2020-07-12 DIAGNOSIS — J690 Pneumonitis due to inhalation of food and vomit: Secondary | ICD-10-CM | POA: Diagnosis not present

## 2020-07-12 DIAGNOSIS — R059 Cough, unspecified: Secondary | ICD-10-CM

## 2020-07-12 DIAGNOSIS — E871 Hypo-osmolality and hyponatremia: Secondary | ICD-10-CM

## 2020-07-12 DIAGNOSIS — B020 Zoster encephalitis: Secondary | ICD-10-CM

## 2020-07-12 DIAGNOSIS — K567 Ileus, unspecified: Secondary | ICD-10-CM

## 2020-07-12 LAB — BASIC METABOLIC PANEL
Anion gap: 13 (ref 5–15)
BUN: 16 mg/dL (ref 8–23)
CO2: 24 mmol/L (ref 22–32)
Calcium: 8.8 mg/dL — ABNORMAL LOW (ref 8.9–10.3)
Chloride: 99 mmol/L (ref 98–111)
Creatinine, Ser: 1.01 mg/dL — ABNORMAL HIGH (ref 0.44–1.00)
GFR calc Af Amer: >60 mL/min (ref >60)
GFR calc non Af Amer: 53 mL/min — ABNORMAL LOW (ref >60)
Glucose, Bld: 128 mg/dL — ABNORMAL HIGH (ref 70–99)
Potassium: 3.8 mmol/L (ref 3.5–5.1)
Sodium: 136 mmol/L (ref 135–145)

## 2020-07-12 LAB — URINALYSIS, COMPLETE (UACMP) WITH MICROSCOPIC
Bilirubin Urine: NEGATIVE
Glucose, UA: NEGATIVE mg/dL
Hgb urine dipstick: NEGATIVE
Ketones, ur: 80 mg/dL — AB
Nitrite: NEGATIVE
Protein, ur: 30 mg/dL — AB
Specific Gravity, Urine: 1.028 (ref 1.005–1.030)
pH: 5 (ref 5.0–8.0)

## 2020-07-12 LAB — CBC WITH DIFFERENTIAL/PLATELET
Abs Immature Granulocytes: 0.02 10*3/uL (ref 0.00–0.07)
Basophils Absolute: 0 10*3/uL (ref 0.0–0.1)
Basophils Relative: 1 %
Eosinophils Absolute: 0 10*3/uL (ref 0.0–0.5)
Eosinophils Relative: 0 %
HCT: 30.2 % — ABNORMAL LOW (ref 36.0–46.0)
Hemoglobin: 9.9 g/dL — ABNORMAL LOW (ref 12.0–15.0)
Immature Granulocytes: 0 %
Lymphocytes Relative: 20 %
Lymphs Abs: 1.1 10*3/uL (ref 0.7–4.0)
MCH: 32.2 pg (ref 26.0–34.0)
MCHC: 32.8 g/dL (ref 30.0–36.0)
MCV: 98.4 fL (ref 80.0–100.0)
Monocytes Absolute: 0.5 10*3/uL (ref 0.1–1.0)
Monocytes Relative: 10 %
Neutro Abs: 3.8 10*3/uL (ref 1.7–7.7)
Neutrophils Relative %: 69 %
Platelets: 246 10*3/uL (ref 150–400)
RBC: 3.07 MIL/uL — ABNORMAL LOW (ref 3.87–5.11)
RDW: 14.8 % (ref 11.5–15.5)
WBC: 5.5 10*3/uL (ref 4.0–10.5)
nRBC: 0 % (ref 0.0–0.2)

## 2020-07-12 LAB — CBC
HCT: 32.3 % — ABNORMAL LOW (ref 36.0–46.0)
Hemoglobin: 10.9 g/dL — ABNORMAL LOW (ref 12.0–15.0)
MCH: 32.3 pg (ref 26.0–34.0)
MCHC: 33.7 g/dL (ref 30.0–36.0)
MCV: 95.8 fL (ref 80.0–100.0)
Platelets: 269 10*3/uL (ref 150–400)
RBC: 3.37 MIL/uL — ABNORMAL LOW (ref 3.87–5.11)
RDW: 14.7 % (ref 11.5–15.5)
WBC: 6.3 10*3/uL (ref 4.0–10.5)
nRBC: 0 % (ref 0.0–0.2)

## 2020-07-12 LAB — HEPATIC FUNCTION PANEL
ALT: 12 U/L (ref 0–44)
AST: 14 U/L — ABNORMAL LOW (ref 15–41)
Albumin: 3.6 g/dL (ref 3.5–5.0)
Alkaline Phosphatase: 62 U/L (ref 38–126)
Bilirubin, Direct: 0.2 mg/dL (ref 0.0–0.2)
Indirect Bilirubin: 0.9 mg/dL (ref 0.3–0.9)
Total Bilirubin: 1.1 mg/dL (ref 0.3–1.2)
Total Protein: 7.1 g/dL (ref 6.5–8.1)

## 2020-07-12 LAB — TROPONIN I (HIGH SENSITIVITY)
Troponin I (High Sensitivity): 11 ng/L (ref <18)
Troponin I (High Sensitivity): 9 ng/L (ref <18)

## 2020-07-12 LAB — CK: Total CK: 46 U/L (ref 38–234)

## 2020-07-12 LAB — PROCALCITONIN: Procalcitonin: <0.1 ng/mL

## 2020-07-12 LAB — URIC ACID: Uric Acid, Serum: 5.8 mg/dL (ref 2.5–7.1)

## 2020-07-12 LAB — LIPASE, BLOOD: Lipase: 27 U/L (ref 11–51)

## 2020-07-12 LAB — MAGNESIUM: Magnesium: 1.8 mg/dL (ref 1.7–2.4)

## 2020-07-12 LAB — LACTIC ACID, PLASMA: Lactic Acid, Venous: 0.9 mmol/L (ref 0.5–1.9)

## 2020-07-12 LAB — SEDIMENTATION RATE: Sed Rate: 48 mm/hr — ABNORMAL HIGH (ref 0–30)

## 2020-07-12 LAB — LACTATE DEHYDROGENASE: LDH: 152 U/L (ref 98–192)

## 2020-07-12 MED ORDER — SODIUM CHLORIDE 0.9 % IV SOLN
1.0000 g | Freq: Once | INTRAVENOUS | Status: AC
  Administered 2020-07-12: 1 g via INTRAVENOUS
  Filled 2020-07-12: qty 1

## 2020-07-12 MED ORDER — ACETAMINOPHEN 325 MG PO TABS
650.0000 mg | ORAL_TABLET | Freq: Once | ORAL | Status: AC
  Administered 2020-07-12: 650 mg via ORAL
  Filled 2020-07-12: qty 2

## 2020-07-12 MED ORDER — SODIUM CHLORIDE 0.9 % IV SOLN
100.0000 mg | Freq: Once | INTRAVENOUS | Status: AC
  Administered 2020-07-12: 100 mg via INTRAVENOUS
  Filled 2020-07-12: qty 100

## 2020-07-12 MED ORDER — DEXTROSE 5 % IV SOLN
10.0000 mg/kg | Freq: Three times a day (TID) | INTRAVENOUS | Status: DC
  Administered 2020-07-13: 795 mg via INTRAVENOUS
  Filled 2020-07-12 (×4): qty 15.9

## 2020-07-12 MED ORDER — SODIUM CHLORIDE 0.9 % IV BOLUS
1000.0000 mL | Freq: Once | INTRAVENOUS | Status: AC
  Administered 2020-07-12: 1000 mL via INTRAVENOUS

## 2020-07-12 MED ORDER — INSULIN ASPART 100 UNIT/ML ~~LOC~~ SOLN
0.0000 [IU] | SUBCUTANEOUS | Status: DC
  Administered 2020-07-15: 2 [IU] via SUBCUTANEOUS
  Administered 2020-07-16 – 2020-07-19 (×7): 1 [IU] via SUBCUTANEOUS
  Administered 2020-07-19: 2 [IU] via SUBCUTANEOUS
  Administered 2020-07-20: 1 [IU] via SUBCUTANEOUS
  Administered 2020-07-20: 2 [IU] via SUBCUTANEOUS
  Administered 2020-07-20: 1 [IU] via SUBCUTANEOUS
  Administered 2020-07-21 (×2): 2 [IU] via SUBCUTANEOUS
  Administered 2020-07-21 – 2020-07-24 (×12): 1 [IU] via SUBCUTANEOUS
  Administered 2020-07-24 (×3): 2 [IU] via SUBCUTANEOUS
  Administered 2020-07-25 (×2): 1 [IU] via SUBCUTANEOUS
  Administered 2020-07-25: 2 [IU] via SUBCUTANEOUS
  Administered 2020-07-26: 1 [IU] via SUBCUTANEOUS
  Administered 2020-07-26: 2 [IU] via SUBCUTANEOUS
  Administered 2020-07-26 (×3): 1 [IU] via SUBCUTANEOUS
  Administered 2020-07-26 – 2020-07-27 (×3): 2 [IU] via SUBCUTANEOUS
  Administered 2020-07-27 (×2): 1 [IU] via SUBCUTANEOUS
  Administered 2020-07-27 – 2020-07-28 (×6): 2 [IU] via SUBCUTANEOUS
  Administered 2020-07-28: 1 [IU] via SUBCUTANEOUS
  Administered 2020-07-28: 2 [IU] via SUBCUTANEOUS
  Administered 2020-07-28: 1 [IU] via SUBCUTANEOUS
  Administered 2020-07-29 – 2020-07-31 (×13): 2 [IU] via SUBCUTANEOUS
  Administered 2020-07-31: 1 [IU] via SUBCUTANEOUS
  Administered 2020-07-31: 2 [IU] via SUBCUTANEOUS
  Administered 2020-07-31: 3 [IU] via SUBCUTANEOUS
  Administered 2020-07-31 – 2020-08-01 (×2): 2 [IU] via SUBCUTANEOUS
  Administered 2020-08-01: 1 [IU] via SUBCUTANEOUS
  Administered 2020-08-01 (×3): 2 [IU] via SUBCUTANEOUS
  Administered 2020-08-01 (×2): 1 [IU] via SUBCUTANEOUS
  Administered 2020-08-02 (×4): 2 [IU] via SUBCUTANEOUS
  Administered 2020-08-02 – 2020-08-03 (×2): 1 [IU] via SUBCUTANEOUS
  Administered 2020-08-03: 2 [IU] via SUBCUTANEOUS
  Administered 2020-08-03: 1 [IU] via SUBCUTANEOUS
  Administered 2020-08-03 (×4): 2 [IU] via SUBCUTANEOUS
  Administered 2020-08-04: 1 [IU] via SUBCUTANEOUS
  Administered 2020-08-04 (×3): 2 [IU] via SUBCUTANEOUS
  Administered 2020-08-04 – 2020-08-05 (×2): 1 [IU] via SUBCUTANEOUS
  Administered 2020-08-05: 2 [IU] via SUBCUTANEOUS
  Administered 2020-08-05: 1 [IU] via SUBCUTANEOUS
  Administered 2020-08-05 – 2020-08-06 (×7): 2 [IU] via SUBCUTANEOUS
  Administered 2020-08-07 (×2): 1 [IU] via SUBCUTANEOUS
  Administered 2020-08-07 (×4): 2 [IU] via SUBCUTANEOUS
  Administered 2020-08-08: 1 [IU] via SUBCUTANEOUS
  Administered 2020-08-08 (×4): 2 [IU] via SUBCUTANEOUS
  Administered 2020-08-08: 3 [IU] via SUBCUTANEOUS
  Administered 2020-08-09 (×5): 2 [IU] via SUBCUTANEOUS
  Administered 2020-08-10: 1 [IU] via SUBCUTANEOUS
  Administered 2020-08-10: 2 [IU] via SUBCUTANEOUS
  Administered 2020-08-10 (×2): 1 [IU] via SUBCUTANEOUS
  Administered 2020-08-11 (×3): 2 [IU] via SUBCUTANEOUS
  Administered 2020-08-11: 3 [IU] via SUBCUTANEOUS
  Administered 2020-08-12 (×3): 2 [IU] via SUBCUTANEOUS
  Administered 2020-08-12: 1 [IU] via SUBCUTANEOUS
  Administered 2020-08-12: 2 [IU] via SUBCUTANEOUS
  Administered 2020-08-12 – 2020-08-13 (×2): 1 [IU] via SUBCUTANEOUS
  Administered 2020-08-13: 5 [IU] via SUBCUTANEOUS
  Administered 2020-08-13 – 2020-08-14 (×5): 2 [IU] via SUBCUTANEOUS
  Administered 2020-08-14 (×3): 1 [IU] via SUBCUTANEOUS
  Administered 2020-08-14 (×2): 2 [IU] via SUBCUTANEOUS
  Administered 2020-08-15 (×2): 1 [IU] via SUBCUTANEOUS
  Administered 2020-08-15 – 2020-08-16 (×5): 2 [IU] via SUBCUTANEOUS
  Administered 2020-08-16: 1 [IU] via SUBCUTANEOUS
  Administered 2020-08-16: 2 [IU] via SUBCUTANEOUS
  Administered 2020-08-17 (×5): 1 [IU] via SUBCUTANEOUS
  Administered 2020-08-18 (×3): 2 [IU] via SUBCUTANEOUS
  Administered 2020-08-18: 1 [IU] via SUBCUTANEOUS
  Administered 2020-08-18: 2 [IU] via SUBCUTANEOUS
  Administered 2020-08-19: 1 [IU] via SUBCUTANEOUS
  Administered 2020-08-19: 2 [IU] via SUBCUTANEOUS
  Administered 2020-08-19: 1 [IU] via SUBCUTANEOUS
  Administered 2020-08-19: 2 [IU] via SUBCUTANEOUS
  Administered 2020-08-19 (×2): 1 [IU] via SUBCUTANEOUS
  Administered 2020-08-20: 2 [IU] via SUBCUTANEOUS
  Administered 2020-08-20: 1 [IU] via SUBCUTANEOUS
  Administered 2020-08-20: 2 [IU] via SUBCUTANEOUS
  Administered 2020-08-20 – 2020-08-22 (×5): 1 [IU] via SUBCUTANEOUS
  Administered 2020-08-23: 2 [IU] via SUBCUTANEOUS
  Administered 2020-08-23: 1 [IU] via SUBCUTANEOUS
  Administered 2020-08-24: 2 [IU] via SUBCUTANEOUS
  Filled 2020-07-12 (×172): qty 1

## 2020-07-12 NOTE — ED Notes (Signed)
AAOx3.  Skin warm and dry.  Ambulating independently throughout ED waiting area.  NAD

## 2020-07-12 NOTE — ED Triage Notes (Signed)
Pt in via EMS from home with c/o pain to her mid chest and big bites on her back. Pt denies SOB just states that she feels really bad.

## 2020-07-12 NOTE — ED Notes (Signed)
Pt family states that pt has been reporting leg pain that "feels like it is in her veins". Pt also c/o of head and chest pain. Pt family also reports pt having insect bites on her back this morning.

## 2020-07-12 NOTE — ED Provider Notes (Addendum)
[facility] Emergency Department Provider Note   ____________________________________________   First MD Initiated Contact with Patient 07/12/20 2038     (approximate)  I have reviewed the triage vital signs and the nursing notes.   HISTORY  Chief Complaint Chest Pain and Insect Bite    HPI Casey Baldwin is a 79 y.o. female who comes in for her third visit in 4 days.  She says she is aching all over.  Today she is running a fever.  Her chest CT from the 31st showed no PE or pneumonia but there was a remark about edema in the right upper quadrant.  She does seem to but somewhat tender there perhaps more than elsewhere.  Her liver functions that they were normal she is not run a white count daughter reports patient has been not thinking quite clearly she thought she saw her son out in the lobby and he [place] in [place] she is perhaps not made as much sense that she should have been.  I will say that she saw me several times on the 31st and did not recognize me today and dressed in the same close with the same mask.        Past Medical History:  Diagnosis Date  . Coronary artery disease   . High cholesterol   . Hypertension     There are no problems to display for this patient.   Past Surgical History:  Procedure Laterality Date  . ABDOMINAL HYSTERECTOMY    . COLON RESECTION    . FACIAL COSMETIC SURGERY    . FRACTURE SURGERY     left leg and left ankle.  Marland Kitchen. GASTRIC BYPASS    . left knee replacement      Prior to Admission medications   Medication Sig Start Date End Date Taking? Authorizing Provider  Acetaminophen 500 MG capsule Take 500 mg by mouth daily as needed.     [provider]  Artificial Tear Ointment (DRY EYES OP) Place 1-2 drops into both eyes daily as needed.    [provider]  aspirin 81 MG EC tablet Take 81 mg by mouth daily.     [provider]  bisacodyl (DULCOLAX) 5 MG EC tablet Take 10 mg by mouth at  bedtime.     [provider]  calcium carbonate (OS-CAL) 1250 (500 Ca) MG chewable tablet Chew 2 tablets by mouth daily.    [provider]  Cholecalciferol (VITAMIN D PO) Take 1 tablet by mouth daily.    [provider]  furosemide (LASIX) 20 MG tablet Take 20-40 mg by mouth daily. CAN TAKE 40MG  FOR SWELLING 04/20/19   [provider]  isosorbide mononitrate (IMDUR) 30 MG 24 hr tablet Take 30 mg by mouth daily. 01/08/20   [provider]  Multiple Vitamin (MULTIVITAMIN WITH MINERALS) TABS tablet Take 1 tablet by mouth daily.    [provider]  Multiple Vitamins-Minerals (ICAPS AREDS 2 PO) Take 1 capsule by mouth daily.    [provider]  ondansetron (ZOFRAN) 4 MG tablet Take 4 mg by mouth daily as needed. 05/24/20   [provider]  pantoprazole (PROTONIX) 20 MG tablet Take 20 mg by mouth daily. 02/22/20   [provider]  rosuvastatin (CRESTOR) 20 MG tablet Take 20 mg by mouth daily. 06/19/20   [provider]  senna-docusate (SENOKOT-S) 8.6-50 MG tablet Take 2 tablets by mouth 2 (two) times daily. Patient not taking: Reported on 04/01/2020 11/21/19  Sharman Cheek, MD  witch hazel-glycerin (TUCKS) pad Apply 1 application topically as needed for itching. Patient not taking: Reported on 07/10/2020 11/21/19   Sharman Cheek, MD    Allergies Lidocaine, Amlodipine, Lactose, Lactose intolerance (gi), and Latex  No family history on file.  Social History Social History   Tobacco Use  . Smoking status: Never Smoker  . Smokeless tobacco: Never Used  Vaping Use  . Vaping Use: Never used  Substance Use Topics  . Alcohol use: No  . Drug use: No    Review of Systems  Constitutional:  Fever but no chills Eyes: No visual changes. ENT: No sore throat. Cardiovascular: Aches all over but denies chest pain as being any worse than anything else. Respiratory: Denies shortness of breath. Gastrointestinal:  Some right upper abdominal pain that does seem to be worse than the rest of her achiness.  No nausea, no vomiting.  No diarrhea.  No constipation. Genitourinary: Negative for dysuria. Musculoskeletal: Negative for back pain worse than anything else. Skin: Patient has several apparent insect bites on her back they started out as insect bites with 3 puncture marks on them her daughter says they are about 5 on her back various sizes up and down the right side of the back now with necrotic centers ranging in size from 2 to 3 mm to possibly as much as 7 mm Neurological: Negative for headaches, focal weakness  ____________________________________________   PHYSICAL EXAM:  VITAL SIGNS: ED Triage Vitals  Enc Vitals Group     BP 07/12/20 0918 (!) 115/97     Pulse Rate 07/12/20 0918 82     Resp 07/12/20 0918 18     Temp 07/12/20 0918 98.4 F (36.9 C)     Temp Source 07/12/20 0918 Oral     SpO2 07/12/20 0918 96 %     Weight 07/12/20 0918 175 lb (79.4 kg)     Height 07/12/20 0918 5\' 4"  (1.626 m)     Head Circumference --      Peak Flow --      Pain Score 07/12/20 0921 0     Pain Loc --      Pain Edu? --      Excl. in GC? --     Constitutional: Currently alert and oriented.  Ill-appearing and saying she feels like she is going to die Eyes: Conjunctivae are normal.  Head: Atraumatic. Nose: No congestion/rhinnorhea. Mouth/Throat: Mucous membranes are moist.  Oropharynx non-erythematous. Neck: No stridor.  Not stiff no complaints of neck pain with neck movement Cardiovascular: Normal rate, regular rhythm. Grossly normal heart sounds.  Good peripheral circulation. Respiratory: Normal respiratory effort.  No retractions. Lungs CTAB. Gastrointestinal: Soft tender diffusely but worse in the right upper quadrant no distention. No abdominal bruits. No CVA tenderness. Musculoskeletal: No lower extremity tenderness nor edema.  No joint effusions. Neurologic:  Normal speech and language. No gross  focal neurologic deficits are appreciated. No gait instability. Skin:  Skin is warm, dry and intact.  Rash as described in HPI   ____________________________________________   LABS (all labs ordered are listed, but only abnormal results are displayed)  Labs Reviewed  BASIC METABOLIC PANEL - Abnormal; Notable for the following components:      Result Value   Glucose, Bld 128 (*)    Creatinine, Ser 1.01 (*)    Calcium 8.8 (*)    GFR calc non Af Amer 53 (*)    All other components within normal limits  CBC - Abnormal;  Notable for the following components:   RBC 3.37 (*)    Hemoglobin 10.9 (*)    HCT 32.3 (*)    All other components within normal limits  HEPATIC FUNCTION PANEL - Abnormal; Notable for the following components:   AST 14 (*)    All other components within normal limits  SEDIMENTATION RATE - Abnormal; Notable for the following components:   Sed Rate 48 (*)    All other components within normal limits  URINALYSIS, COMPLETE (UACMP) WITH MICROSCOPIC - Abnormal; Notable for the following components:   Color, Urine YELLOW (*)    APPearance CLOUDY (*)    Ketones, ur 80 (*)    Protein, ur 30 (*)    Leukocytes,Ua MODERATE (*)    Bacteria, UA RARE (*)    All other components within normal limits  URINE CULTURE  CULTURE, BLOOD (ROUTINE X 2)  CULTURE, BLOOD (ROUTINE X 2)  LIPASE, BLOOD  PROCALCITONIN  ROCKY MTN SPOTTED FVR ABS PNL(IGG+IGM)  LACTIC ACID, PLASMA  CK  CBC WITH DIFFERENTIAL/PLATELET  TROPONIN I (HIGH SENSITIVITY)  TROPONIN I (HIGH SENSITIVITY)   ____________________________________________  EKG  EKG read interpreted by me shows normal sinus rhythm rate of 81 left axis no acute ST-T wave changes EKG looks the same as the one from yesterday and very similar to the one from the day before ____________________________________________  RADIOLOGY  ED MD interpretation: Chest x-ray read by radiology reviewed by me does not show any sign of  disease  Official radiology report(s): DG Chest 2 View  Result Date: 07/12/2020 CLINICAL DATA:  Chest pain for 3 days EXAM: CHEST - 2 VIEW COMPARISON:  04/01/2020 FINDINGS: Normal heart size and mediastinal contours. No acute infiltrate or edema. No effusion or pneumothorax. No acute osseous findings. Postoperative epigastric region. IMPRESSION: No evidence of active disease. Electronically Signed   By: Marnee Spring M.D.   On: 07/12/2020 10:26   US Abdomen Limited RUQ  Result Date: 07/12/2020 CLINICAL DATA:  Abnormal CT with right upper quadrant edema. EXAM: ULTRASOUND ABDOMEN LIMITED RIGHT UPPER QUADRANT COMPARISON:  04/01/2020, 07/10/2020 FINDINGS: Gallbladder: Surgically absent Common bile duct: Diameter: 7 mm Liver: Echogenicity is within normal limits. There is no focal parenchymal abnormality. Stable mild intrahepatic biliary duct dilation. Portal vein is patent on color Doppler imaging with normal direction of blood flow towards the liver. Other: No free fluid. IMPRESSION: 1. Cholecystectomy. 2. Mild intrahepatic biliary duct dilation unchanged since CT. Normal caliber of the common bile duct in a patient status post cholecystectomy. 3. Otherwise unremarkable exam. Evaluation of the right upper quadrant mesenteric edema seen on CT is limited by ultrasound. Electronically Signed   By: Sharlet Salina M.D.   On: 07/12/2020 21:46    ____________________________________________   PROCEDURES  Procedure(s) performed (including Critical Care): Critical care time half an hour.  This includes reviewing her old records researching eschars on the line and an up-to-date and talking unofficially to the hospitalist and the patient's daughter migraines Procedures   ____________________________________________   INITIAL IMPRESSION / ASSESSMENT AND PLAN / ED COURSE  Patient is a retired Engineer, civil (consulting).  She complains of aching all over.  The other day when she was here the worst pain was in her legs today she  is having pain in her belly and complains of sharp stabbing pain in the head that comes and goes otherwise she has a mild as I understand it headache she is running a fever of 101 today which is new.  White blood count  has been normal sed rate was mildly elevated the other day 32 I believe it was.  D-dimer was 1077 but CT was negative ultrasound of the right upper quadrant just now was negative    ----------------------------------------- 10:39 PM on 07/12/2020 -----------------------------------------  Speaking with the hospitalist unofficially this could be possibly multidermatomal shingles or possibly meningitis patient says she has had a headache for the last few days when I talked to her about a spinal tap but she says she had meningitis once before and she is scared to death of a spinal tap and does not want another 1.  She is a Engineer, civil (consulting) or retired Engineer, civil (consulting).  She wants me to wait till her daughter gets back here and talk to them again which I will do.          ____________________________________________   FINAL CLINICAL IMPRESSION(S) / ED DIAGNOSES  Final diagnoses:  Fever, unspecified fever cause  Transient alteration of awareness  Urinary tract infection with hematuria, site unspecified     ED Discharge Orders    None       Note:  This document was prepared using Dragon voice recognition software and may include unintentional dictation errors.    Arnaldo Natal, MD 07/12/20 2241    Arnaldo Natal, MD 07/12/20 2242

## 2020-07-12 NOTE — H&P (Signed)
Yamaira Spinner ZOX:[id number] DOB: [birth year] DOA: 07/12/2020     PCP: Lesly Rubenstein, MD   Outpatient Specialists:   CARDS:   Dr. Christell Constant, Osker Mason, [facility]    Patient arrived to ER on 07/12/20 at 0840 Referred by Attending Arnaldo Natal, MD   Patient coming from: home     Chief Complaint: Leg Pain and Shortness of Breath Chief Complaint  Patient presents with  . Chest Pain  . Insect Bite    HPI: Phyllicia Dudek is a 79 y.o. 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  Her temperature at home was up to 102.2 Patient was very weak and had hard time getting up Family recently noted a rash that was going around her right side of back and right breast.  Rash is intensely pruritic different stages of healing some areas of vesicular be red border.  Some areas covered in black eschars.  Skin is diffusely tender.  Today patient developed also confusion fever. Of note in May she had similar admission for confusion and body aches and fever in the time she was having UTI. Patient is fully vaccinated at her baseline she walks with a walker 10 weeks ago patient had gallbladder removed. Ever since her discharge from the hospital from gastric bypass she has been generally weak Patient is a former Engineer, civil (consulting).  She have had 3 visits to emergency department over past 3 days she have had extensive work up including plain chest x-ray, a CTA of the chest, right upper quadrant ultrasound lower extremity ultrasounds Patient was noted to have elevated sed rate which continued to rise White blood cell count remained normal procalcitonin unremarkable Imaging bilateral tibia and  fibula also unremarkable Hemoglobin remained stable around 10 Today UA showed evidence of UTI  Infectious risk factors:  Reports  fever, shortness of breath,  severe fatigue    Has  been vaccinated against COVID    Initial COVID TEST   in house  PCR testing  Pending  Lab Results  Component Value Date   SARSCOV2NAA NEGATIVE 07/10/2020   SARSCOV2NAA NEGATIVE 07/09/2020   SARSCOV2NAA NEGATIVE 04/01/2020     Regarding pertinent Chronic problems:    Hyperlipidemia -  on statins Crestor Lipid Panel  No results found for: CHOL, TRIG, HDL, CHOLHDL, VLDL, LDLCALC, LDLDIRECT, LABVLDL   HTN on Imdur,    chronic CHF diastolic -    Echo 2016: ECHO: EF 55%, Grade I diastolic dysfunction, bilateral atrial enlargement, Mild MR  On Lasix   obesity-   BMI Readings from Last 1 Encounters:  07/12/20 30.04 kg/m       OSA -on nocturnal CPAP,      Chronic anemia - baseline hg Hemoglobin & Hematocrit  Iron deficiency   Recent Labs    07/08/20 2213 07/10/20 0753 07/12/20 0919  HGB 9.8* 10.2* 10.9*    Biliary obstruction 04/02/2020  Nausea and vomiting 04/02/2020  Hx of laparoscopic gastric banding 02/09/2020  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 Patient currently able to move her neck without any pain Symptoms more consistent of encephalitis Hospitalist was called for admission for Sirs in the setting of shingles and possible encephalitis  The following Work up has been  ordered so far:  Orders Placed This Encounter  Procedures  . Urine culture  . Culture, blood (routine x 2)  . SARS Coronavirus 2 by RT PCR (hospital order, performed in [facility] hospital lab) Nasopharyngeal Nasopharyngeal Swab  . DG Chest 2 View  . US Abdomen Limited RUQ  . Basic metabolic panel  . CBC  . Procalcitonin - Baseline  . Lipase, blood  . Hepatic function panel  . Sedimentation rate  . Urinalysis, Complete w Microscopic  . Rocky mtn spotted fvr abs pnl(IgG+IgM)  . Lactic acid, plasma  . CK  . CBC with Differential  . Uric acid  . Magnesium  . Lactate dehydrogenase  . Cardiac monitoring  . Consult to hospitalist  ALL PATIENTS BEING ADMITTED/HAVING PROCEDURES NEED COVID-19 SCREENING  . ED EKG    Following Medications were ordered in ER: Medications  doxycycline (VIBRAMYCIN) 100 mg in sodium chloride 0.9 % 250 mL IVPB (100 mg Intravenous New Bag/Given 07/12/20 2234)  acyclovir (ZOVIRAX) 795 mg in dextrose 5 % 150 mL IVPB (has no administration in time range)  acetaminophen (TYLENOL) tablet 650 mg (650 mg Oral Given 07/12/20 2047)  sodium chloride 0.9 % bolus 1,000 mL (1,000 mLs Intravenous New Bag/Given 07/12/20 2153)  ceFEPIme (MAXIPIME) 1 g in sodium chloride 0.9 % 100 mL IVPB (0 g Intravenous Stopped 07/12/20 2232)        Consult Orders  (From admission, onward)         Start     Ordered   07/12/20 2243  Consult to hospitalist  ALL PATIENTS BEING ADMITTED/HAVING PROCEDURES NEED COVID-19 SCREENING  Once       Comments: ALL PATIENTS BEING ADMITTED/HAVING PROCEDURES NEED COVID-19 SCREENING  Provider:  (Not yet assigned)  Question Answer Comment  Place call to: Hospitalist   Reason for Consult Admit   Diagnosis/Clinical Info for Consult: Fever transient alteration of mental status      07/12/20 2243          Significant initial  Findings: Abnormal Labs Reviewed  BASIC METABOLIC PANEL - Abnormal; Notable for the following components:      Result Value    Glucose, Bld 128 (*)    Creatinine, Ser 1.01 (*)    Calcium 8.8 (*)    GFR calc non Af Amer 53 (*)    All other components within normal limits  CBC - Abnormal; Notable for the following components:   RBC 3.37 (*)    Hemoglobin 10.9 (*)    HCT 32.3 (*)    All other components within normal limits  HEPATIC FUNCTION PANEL - Abnormal; Notable for the following components:   AST 14 (*)    All other components within normal limits  SEDIMENTATION RATE - Abnormal; Notable for the following components:   Sed Rate 48 (*)    All other components within normal limits  URINALYSIS, COMPLETE (UACMP) WITH MICROSCOPIC -  Abnormal; Notable for the following components:   Color, Urine YELLOW (*)    APPearance CLOUDY (*)    Ketones, ur 80 (*)    Protein, ur 30 (*)    Leukocytes,Ua MODERATE (*)    Bacteria, UA RARE (*)    All other components within normal limits    Otherwise labs showing:    Recent Labs  Lab 07/08/20 2213 07/10/20 0753 07/12/20 0919  NA 133* 133* 136  K 3.7 3.6 3.8  CO2 23 25 24   GLUCOSE 128* 171* 128*  BUN 13 12 16   CREATININE 0.94 1.02* 1.01*  CALCIUM 8.4* 8.7* 8.8*    Cr   stable,  Up from baseline see below Lab Results  Component Value Date   CREATININE 1.01 (H) 07/12/2020   CREATININE 1.02 (H) 07/10/2020   CREATININE 0.94 07/08/2020    Recent Labs  Lab 07/10/20 0753 07/12/20 2108  AST 16 14*  ALT 15 12  ALKPHOS 61 62  BILITOT 1.1 1.1  PROT 7.1 7.1  ALBUMIN 3.8 3.6   Lab Results  Component Value Date   CALCIUM 8.8 (L) 07/12/2020     WBC      Component Value Date/Time   WBC 6.3 07/12/2020 0919   ANC    Component Value Date/Time   NEUTROABS 5.5 07/10/2020 0753   ALC No components found for: LYMPHAB    Plt: Lab Results  Component Value Date   PLT 269 07/12/2020    Lactic Acid, Venous    Component Value Date/Time   LATICACIDVEN 0.9 07/12/2020 2108   @ (RESUTFAST[lacticacid:4)@  Procalcitonin <0.1   COVID-19 Labs  No results  for input(s): DDIMER, FERRITIN, LDH, CRP in the last 72 hours.  Lab Results  Component Value Date   SARSCOV2NAA NEGATIVE 07/10/2020   SARSCOV2NAA NEGATIVE 07/09/2020   SARSCOV2NAA NEGATIVE 04/01/2020      HG/HCT  stable,      Component Value Date/Time   HGB 9.9 (L) 07/12/2020 2108   HCT 30.2 (L) 07/12/2020 2108    Recent Labs  Lab 07/12/20 2108  LIPASE 27   No results for input(s): AMMONIA in the last 168 hours.     Cardiac Panel (last 3 results) Recent Labs    07/10/20 0753 07/12/20 2108  CKTOTAL 90 46     ECG: Ordered Personally reviewed by me showing: HR : 82 Rhythm:  NSR     no evidence of ischemic changes QTC 448   BNP (last 3 results) Recent Labs    07/10/20 0753  BNP 212.1*      UA  evidence of UTI      Urine analysis:    Component Value Date/Time   COLORURINE YELLOW (A) 07/12/2020 2123   APPEARANCEUR CLOUDY (A) 07/12/2020 2123   LABSPEC 1.028 07/12/2020 2123   PHURINE 5.0 07/12/2020 2123   GLUCOSEU NEGATIVE 07/12/2020 2123   HGBUR NEGATIVE 07/12/2020 2123   BILIRUBINUR NEGATIVE 07/12/2020 2123   KETONESUR 80 (A) 07/12/2020 2123   PROTEINUR 30 (A) 07/12/2020 2123   NITRITE NEGATIVE 07/12/2020 2123   LEUKOCYTESUR MODERATE (A) 07/12/2020 2123       Ordered   CXR -  NON acute   CTA chest -  nonacute, no PE   LEGS no acute Doppler negative   ED Triage Vitals  Enc Vitals Group     BP 07/12/20 0918 (!) 115/97     Pulse Rate 07/12/20 0918 82     Resp 07/12/20 0918 18     Temp  07/12/20 0918 98.4 F (36.9 C)     Temp Source 07/12/20 0918 Oral     SpO2 07/12/20 0918 96 %     Weight 07/12/20 0918 175 lb (79.4 kg)     Height 07/12/20 0918  (1.626 m)     Head Circumference --      Peak Flow --      Pain Score 07/12/20 0921 0     Pain Loc --      Pain Edu? --      Excl. in GC? --   TMAX(24)@       Latest  Blood pressure (!) 162/74, pulse 78, temperature 98.7 F (37.1 C), temperature source Oral, resp. rate 19, height  (1.626  m), weight 79.4 kg, SpO2 95 %.      Review of Systems:    Pertinent positives include: Fevers, chills, fatigue, body aches Headaches,  Confusion urgency or frequency.  Constitutional:  No weight loss, night sweats,  weight loss  HEENT:  No  Difficulty swallowing,Tooth/dental problems,Sore throat,  No sneezing, itching, ear ache, nasal congestion, post nasal drip,  Cardio-vascular:  No chest pain, Orthopnea, PND, anasarca, dizziness, palpitations.no Bilateral lower extremity swelling  GI:  No heartburn, indigestion, abdominal pain, nausea, vomiting, diarrhea, change in bowel habits, loss of appetite, melena, blood in stool, hematemesis Resp:  no shortness of breath at rest. No dyspnea on exertion, No excess mucus, no productive cough, No non-productive cough, No coughing up of blood.No change in color of mucus.No wheezing. Skin:  no rash or lesions. No jaundice GU:  no dysuria, change in color of urine, no No straining to urinate.  No flank pain.  Musculoskeletal:  No joint pain or no joint swelling. No decreased range of motion. No back pain.  Psych:  No change in mood or affect. No depression or anxiety. No memory loss.  Neuro: no localizing neurological complaints, no tingling, no weakness, no double vision, no gait abnormality, no slurred speech, no  All systems reviewed and apart from HOPI all are negative  Past Medical History:   Past Medical History:  Diagnosis Date  . Coronary artery disease   . High cholesterol   . Hypertension       Past Surgical History:  Procedure Laterality Date  . ABDOMINAL HYSTERECTOMY    . COLON RESECTION    . FACIAL COSMETIC SURGERY    . FRACTURE SURGERY     left leg and left ankle.  Marland Kitchen GASTRIC BYPASS    . left knee replacement      Social History:  Ambulatory   walker       reports that she has never smoked. She has never used smokeless tobacco. She reports that she does not drink alcohol and does not use drugs.   Family  History: Colon cancer  Allergies: Allergies  Allergen Reactions  . Lidocaine Anaphylaxis and Other (See Comments)    Became unconscious with administration for dental procedure at age 79yo. Pt tolerated subsequent lidocaine.    . Amlodipine Other (See Comments)    Hand and leg cramping  . Lactose Diarrhea  . Lactose Intolerance (Gi)   . Latex      Prior to Admission medications   Medication Sig Start Date End Date Taking? Authorizing Provider  Acetaminophen 500 MG capsule Take 500 mg by mouth daily as needed.     [provider]  Artificial Tear Ointment (DRY EYES OP) Place 1-2 drops into both eyes daily as needed.  [provider]  aspirin 81 MG EC tablet Take 81 mg by mouth daily.     [provider]  bisacodyl (DULCOLAX) 5 MG EC tablet Take 10 mg by mouth at bedtime.     [provider]  calcium carbonate (OS-CAL) 1250 (500 Ca) MG chewable tablet Chew 2 tablets by mouth daily.    [provider]  Cholecalciferol (VITAMIN D PO) Take 1 tablet by mouth daily.    [provider]  furosemide (LASIX) 20 MG tablet Take 20-40 mg by mouth daily. CAN TAKE 40MG  FOR SWELLING 04/20/19   [provider]  isosorbide mononitrate (IMDUR) 30 MG 24 hr tablet Take 30 mg by mouth daily. 01/08/20   [provider]  Multiple Vitamin (MULTIVITAMIN WITH MINERALS) TABS tablet Take 1 tablet by mouth daily.    [provider]  Multiple Vitamins-Minerals (ICAPS AREDS 2 PO) Take 1 capsule by mouth daily.    [provider]  ondansetron (ZOFRAN) 4 MG tablet Take 4 mg by mouth daily as needed. 05/24/20   [provider]  pantoprazole (PROTONIX) 20 MG tablet Take 20 mg by mouth daily. 02/22/20   [provider]  rosuvastatin (CRESTOR) 20 MG tablet Take 20 mg by mouth daily. 06/19/20   [provider]  senna-docusate (SENOKOT-S) 8.6-50 MG tablet Take 2 tablets by mouth 2 (two) times daily. Patient not  taking: Reported on 04/01/2020 11/21/19   01/19/20, MD  witch hazel-glycerin (TUCKS) pad Apply 1 application topically as needed for itching. Patient not taking: Reported on 07/10/2020 11/21/19   01/19/20, MD   Physical Exam: Vitals with BMI 07/12/2020 07/12/2020 07/12/2020  Height - - -  Weight - - -  BMI - - -  Systolic 162 178 09/11/2020  Diastolic 74 82 77  Pulse 78 83 92     1. General:  in No Acute distress   Chronically ill  -appearing 2. Psychological: Alert and  Oriented 3. Head/ENT:     Dry Mucous Membranes                          Head Non traumatic, neck supple                           Poor Dentition 4. SKIN:   decreased Skin turgor,  Skin clean Dry  Dermatomal rash present      5. Heart: Regular rate and rhythm no Murmur, no Rub or gallop 6. Lungs:   no wheezes or crackles   7. Abdomen: Soft,  non-tender, Non distended bowel sounds present 8. Lower extremities: no clubbing, cyanosis, no  edema 9. Neurologically Grossly intact, moving all 4 extremities equally  10. MSK: Normal range of motion   All other LABS:     Recent Labs  Lab 07/08/20 2213 07/10/20 0753 07/12/20 0919  WBC 8.0 7.2 6.3  NEUTROABS  --  5.5  --   HGB 9.8* 10.2* 10.9*  HCT 30.4* 31.7* 32.3*  MCV 99.3 100.0 95.8  PLT 261 258 269     Recent Labs  Lab 07/08/20 2213 07/10/20 0753 07/12/20 0919  NA 133* 133* 136  K 3.7 3.6 3.8  CL 102 100 99  CO2 23 25 24   GLUCOSE 128* 171* 128*  BUN 13 12 16   CREATININE 0.94 1.02* 1.01*  CALCIUM 8.4* 8.7* 8.8*     Recent Labs  Lab 07/10/20 0753 07/12/20 2108  AST 16 14*  ALT 15 12  ALKPHOS 61 62  BILITOT 1.1 1.1  PROT 7.1 7.1  ALBUMIN 3.8 3.6      Cultures:    Component Value Date/Time   SDES BLOOD RIGHT [facility] 07/09/2020 0139   SPECREQUEST  07/09/2020 0139    BOTTLES DRAWN AEROBIC AND ANAEROBIC Blood Culture results may not be optimal due to an excessive volume of blood received in culture bottles   CULT  07/09/2020 0139    NO  GROWTH 3 DAYS Performed at [facility], [address] [place], [place] [address]    REPTSTATUS PENDING 07/09/2020 0139     Radiological Exams on Admission: DG Chest 2 View  Result Date: 07/12/2020 CLINICAL DATA:  Chest pain for 3 days EXAM: CHEST - 2 VIEW COMPARISON:  04/01/2020 FINDINGS: Normal heart size and mediastinal contours. No acute infiltrate or edema. No effusion or pneumothorax. No acute osseous findings. Postoperative epigastric region. IMPRESSION: No evidence of active disease. Electronically Signed   By: Marnee Spring M.D.   On: 07/12/2020 10:26   US Abdomen Limited RUQ  Result Date: 07/12/2020 CLINICAL DATA:  Abnormal CT with right upper quadrant edema. EXAM: ULTRASOUND ABDOMEN LIMITED RIGHT UPPER QUADRANT COMPARISON:  04/01/2020, 07/10/2020 FINDINGS: Gallbladder: Surgically absent Common bile duct: Diameter: 7 mm Liver: Echogenicity is within normal limits. There is no focal parenchymal abnormality. Stable mild intrahepatic biliary duct dilation. Portal vein is patent on color Doppler imaging with normal direction of blood flow towards the liver. Other: No free fluid. IMPRESSION: 1. Cholecystectomy. 2. Mild intrahepatic biliary duct dilation unchanged since CT. Normal caliber of the common bile duct in a patient status post cholecystectomy. 3. Otherwise unremarkable exam. Evaluation of the right upper quadrant mesenteric edema seen on CT is limited by ultrasound. Electronically Signed   By: Sharlet Salina M.D.   On: 07/12/2020 21:46    Chart has been reviewed    Assessment/Plan  79 y.o. 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 SIRS/sepsis-cover broadly with Vanco and high-dose Rocephin for now although meningitis much less likely given supple neck and symptoms more consistent  with encephalitis patient refusing an LP Observe on antibiotics overnight can be deescalated in a.m. Patient meeting SIRS criteria with fever, HR >90's Procalcitonin unremarkable lactic acid within normal limits no evidence of hypotension. Patient meeting sepsis criteria with urine as a possible source   evidence of severe sepsis at this time with encephalitis/encephalopathy Blood and urine cultures obtained Will rehydrate and initiate broad-spectrum antibiotics Also include antivirals  . Shingles rash worrisome for complicated illness possible encephalitis. Received disseminated illness and given fever and generalized symptoms.  Continue of IV acyclovir.  . Delirium/. Acute metabolic encephalopathy -in the setting of shingles have to consider zoster induced encephalitis, cover with IV acyclovir and obtain MRI would appreciate ID consult  . Essential hypertension -allow permissive hypertension for today  . Anemia -chronic stable obtain anemia panel   . Acute lower UTI -this could be contributing also to infectious process.  Will cover with Rocephin for now  . Chronic diastolic CHF (congestive heart failure) (HCC) hold Lasix patient appears to be on the dry side will rehydrate and follow fluid status  Diabetes mellitus diet controlled will order sliding scale  Other plan as per orders.  DVT prophylaxis:  SCD  Code Status:    Code Status: Not on file FULL CODE   as per patient   I had personally discussed CODE STATUS with patient     Family Communication:   Family not at  Bedside   Disposition Plan:    To home once workup is complete and patient is stable   Following barriers for discharge:                            Electrolytes corrected                                                             Pain controlled with PO medications                               Afebrile,   able to transition to PO antibiotics                             Will need to be able to tolerate PO                             Will likely need home health,                             Will need consultants to evaluate patient prior to discharge                      Consults called: Epic msg to ID   Admission status:  ED Disposition    ED Disposition Condition Comment   Admit  [facility] [[id number][facility]  Level of Care: Med-Surg [16]  Covid Evaluation: Symptomatic Person Under Investigation (PUI)  Diagnosis: SIRS (systemic inflammatory response syndrome) [facility](HCC) [[id number]]  Admitting Physician: Therisa DoyneUTOVA, Tyrhonda Georgiades [[id number]]  Attending Physician: Therisa DoyneUTOVA, Mckala Pantaleon [[id number]]        Obs   Level of care    tele  For   24H     Lab Results  Component Value Date   SARSCOV2NAA NEGATIVE 07/10/2020     Precautions: admitted as PUI   Airborne and Contact precautions Continue precautions for shingles   PPE: Used by the provider:   P100  eye Goggles,  Gloves    Haruki Arnold 07/13/2020, 12:25 AM    [facility]     after 2 AM please page floor coverage PA If 7AM-7PM, please contact the day team taking care of the patient using [email or web address]   Patient was evaluated in the context of the global COVID-19 pandemic, which necessitated consideration that the patient might be at risk for infection with the SARS-CoV-2 virus that causes COVID-19. Institutional protocols and algorithms that pertain to the evaluation of patients at risk for COVID-19 are in a state of rapid change based on information released by regulatory bodies including the CDC and federal and state organizations. These policies and algorithms were followed during the patient's care.

## 2020-07-12 NOTE — ED Notes (Signed)
Pt also has 4-5 insect-type bites on her back and 1 dime size red mark on rt breast that pt's family thinks are insect bites.

## 2020-07-12 NOTE — ED Triage Notes (Signed)
Arrives via [facility].  Has been seen through ED daily for the past 3 days.  C/O general pain, headache, temp 101 by EMS.  And rash/ bits to chest and back.  Vs wnl.

## 2020-07-13 ENCOUNTER — Encounter: Payer: Self-pay | Admitting: Internal Medicine

## 2020-07-13 ENCOUNTER — Observation Stay: Payer: Medicare Other

## 2020-07-13 DIAGNOSIS — G4733 Obstructive sleep apnea (adult) (pediatric): Secondary | ICD-10-CM | POA: Diagnosis present

## 2020-07-13 DIAGNOSIS — A858 Other specified viral encephalitis: Secondary | ICD-10-CM | POA: Diagnosis present

## 2020-07-13 DIAGNOSIS — R509 Fever, unspecified: Secondary | ICD-10-CM | POA: Diagnosis not present

## 2020-07-13 DIAGNOSIS — R338 Other retention of urine: Secondary | ICD-10-CM | POA: Diagnosis not present

## 2020-07-13 DIAGNOSIS — R519 Headache, unspecified: Secondary | ICD-10-CM | POA: Diagnosis not present

## 2020-07-13 DIAGNOSIS — R9389 Abnormal findings on diagnostic imaging of other specified body structures: Secondary | ICD-10-CM | POA: Diagnosis not present

## 2020-07-13 DIAGNOSIS — G9341 Metabolic encephalopathy: Secondary | ICD-10-CM | POA: Diagnosis present

## 2020-07-13 DIAGNOSIS — E785 Hyperlipidemia, unspecified: Secondary | ICD-10-CM | POA: Diagnosis present

## 2020-07-13 DIAGNOSIS — I471 Supraventricular tachycardia: Secondary | ICD-10-CM | POA: Diagnosis present

## 2020-07-13 DIAGNOSIS — A879 Viral meningitis, unspecified: Secondary | ICD-10-CM | POA: Diagnosis not present

## 2020-07-13 DIAGNOSIS — D75838 Other thrombocytosis: Secondary | ICD-10-CM | POA: Diagnosis not present

## 2020-07-13 DIAGNOSIS — N390 Urinary tract infection, site not specified: Secondary | ICD-10-CM | POA: Diagnosis present

## 2020-07-13 DIAGNOSIS — R4182 Altered mental status, unspecified: Secondary | ICD-10-CM | POA: Diagnosis not present

## 2020-07-13 DIAGNOSIS — R600 Localized edema: Secondary | ICD-10-CM | POA: Diagnosis not present

## 2020-07-13 DIAGNOSIS — F05 Delirium due to known physiological condition: Secondary | ICD-10-CM | POA: Diagnosis not present

## 2020-07-13 DIAGNOSIS — E871 Hypo-osmolality and hyponatremia: Secondary | ICD-10-CM | POA: Diagnosis not present

## 2020-07-13 DIAGNOSIS — N179 Acute kidney failure, unspecified: Secondary | ICD-10-CM | POA: Diagnosis present

## 2020-07-13 DIAGNOSIS — E119 Type 2 diabetes mellitus without complications: Secondary | ICD-10-CM | POA: Diagnosis present

## 2020-07-13 DIAGNOSIS — A419 Sepsis, unspecified organism: Secondary | ICD-10-CM | POA: Diagnosis present

## 2020-07-13 DIAGNOSIS — B010 Varicella meningitis: Secondary | ICD-10-CM | POA: Diagnosis present

## 2020-07-13 DIAGNOSIS — R52 Pain, unspecified: Secondary | ICD-10-CM

## 2020-07-13 DIAGNOSIS — E440 Moderate protein-calorie malnutrition: Secondary | ICD-10-CM | POA: Diagnosis present

## 2020-07-13 DIAGNOSIS — I5032 Chronic diastolic (congestive) heart failure: Secondary | ICD-10-CM | POA: Diagnosis present

## 2020-07-13 DIAGNOSIS — R410 Disorientation, unspecified: Secondary | ICD-10-CM | POA: Diagnosis not present

## 2020-07-13 DIAGNOSIS — R339 Retention of urine, unspecified: Secondary | ICD-10-CM | POA: Diagnosis not present

## 2020-07-13 DIAGNOSIS — R404 Transient alteration of awareness: Secondary | ICD-10-CM | POA: Diagnosis not present

## 2020-07-13 DIAGNOSIS — J690 Pneumonitis due to inhalation of food and vomit: Secondary | ICD-10-CM | POA: Diagnosis not present

## 2020-07-13 DIAGNOSIS — Z66 Do not resuscitate: Secondary | ICD-10-CM | POA: Diagnosis not present

## 2020-07-13 DIAGNOSIS — G9340 Encephalopathy, unspecified: Secondary | ICD-10-CM | POA: Diagnosis not present

## 2020-07-13 DIAGNOSIS — B021 Zoster meningitis: Secondary | ICD-10-CM | POA: Diagnosis not present

## 2020-07-13 DIAGNOSIS — B029 Zoster without complications: Secondary | ICD-10-CM | POA: Diagnosis present

## 2020-07-13 DIAGNOSIS — D649 Anemia, unspecified: Secondary | ICD-10-CM | POA: Diagnosis not present

## 2020-07-13 DIAGNOSIS — R579 Shock, unspecified: Secondary | ICD-10-CM | POA: Diagnosis present

## 2020-07-13 DIAGNOSIS — R630 Anorexia: Secondary | ICD-10-CM | POA: Diagnosis not present

## 2020-07-13 DIAGNOSIS — R6520 Severe sepsis without septic shock: Secondary | ICD-10-CM | POA: Diagnosis present

## 2020-07-13 DIAGNOSIS — Z515 Encounter for palliative care: Secondary | ICD-10-CM | POA: Diagnosis not present

## 2020-07-13 DIAGNOSIS — R569 Unspecified convulsions: Secondary | ICD-10-CM | POA: Diagnosis not present

## 2020-07-13 DIAGNOSIS — I10 Essential (primary) hypertension: Secondary | ICD-10-CM | POA: Diagnosis present

## 2020-07-13 DIAGNOSIS — D530 Protein deficiency anemia: Secondary | ICD-10-CM | POA: Diagnosis not present

## 2020-07-13 DIAGNOSIS — I110 Hypertensive heart disease with heart failure: Secondary | ICD-10-CM | POA: Diagnosis present

## 2020-07-13 DIAGNOSIS — B020 Zoster encephalitis: Secondary | ICD-10-CM | POA: Diagnosis not present

## 2020-07-13 DIAGNOSIS — Z20822 Contact with and (suspected) exposure to covid-19: Secondary | ICD-10-CM | POA: Diagnosis present

## 2020-07-13 DIAGNOSIS — R6510 Systemic inflammatory response syndrome (SIRS) of non-infectious origin without acute organ dysfunction: Secondary | ICD-10-CM | POA: Diagnosis not present

## 2020-07-13 DIAGNOSIS — R079 Chest pain, unspecified: Secondary | ICD-10-CM | POA: Diagnosis not present

## 2020-07-13 LAB — RETICULOCYTES
Immature Retic Fract: 19.6 % — ABNORMAL HIGH (ref 2.3–15.9)
RBC.: 2.92 MIL/uL — ABNORMAL LOW (ref 3.87–5.11)
Retic Count, Absolute: 44.4 10*3/uL (ref 19.0–186.0)
Retic Ct Pct: 1.5 % (ref 0.4–3.1)

## 2020-07-13 LAB — COMPREHENSIVE METABOLIC PANEL
ALT: 11 U/L (ref 0–44)
AST: 14 U/L — ABNORMAL LOW (ref 15–41)
Albumin: 3.3 g/dL — ABNORMAL LOW (ref 3.5–5.0)
Alkaline Phosphatase: 55 U/L (ref 38–126)
Anion gap: 9 (ref 5–15)
BUN: 15 mg/dL (ref 8–23)
CO2: 23 mmol/L (ref 22–32)
Calcium: 8.3 mg/dL — ABNORMAL LOW (ref 8.9–10.3)
Chloride: 102 mmol/L (ref 98–111)
Creatinine, Ser: 0.86 mg/dL (ref 0.44–1.00)
GFR calc Af Amer: >60 mL/min (ref >60)
GFR calc non Af Amer: >60 mL/min (ref >60)
Glucose, Bld: 108 mg/dL — ABNORMAL HIGH (ref 70–99)
Potassium: 3.3 mmol/L — ABNORMAL LOW (ref 3.5–5.1)
Sodium: 134 mmol/L — ABNORMAL LOW (ref 135–145)
Total Bilirubin: 0.9 mg/dL (ref 0.3–1.2)
Total Protein: 6.3 g/dL — ABNORMAL LOW (ref 6.5–8.1)

## 2020-07-13 LAB — CBC WITH DIFFERENTIAL/PLATELET
Abs Immature Granulocytes: 0.02 10*3/uL (ref 0.00–0.07)
Basophils Absolute: 0 10*3/uL (ref 0.0–0.1)
Basophils Relative: 0 %
Eosinophils Absolute: 0.1 10*3/uL (ref 0.0–0.5)
Eosinophils Relative: 1 %
HCT: 28.3 % — ABNORMAL LOW (ref 36.0–46.0)
Hemoglobin: 9.2 g/dL — ABNORMAL LOW (ref 12.0–15.0)
Immature Granulocytes: 0 %
Lymphocytes Relative: 25 %
Lymphs Abs: 1.2 10*3/uL (ref 0.7–4.0)
MCH: 32.3 pg (ref 26.0–34.0)
MCHC: 32.5 g/dL (ref 30.0–36.0)
MCV: 99.3 fL (ref 80.0–100.0)
Monocytes Absolute: 0.6 10*3/uL (ref 0.1–1.0)
Monocytes Relative: 12 %
Neutro Abs: 3 10*3/uL (ref 1.7–7.7)
Neutrophils Relative %: 62 %
Platelets: 227 10*3/uL (ref 150–400)
RBC: 2.85 MIL/uL — ABNORMAL LOW (ref 3.87–5.11)
RDW: 14.6 % (ref 11.5–15.5)
WBC: 4.9 10*3/uL (ref 4.0–10.5)
nRBC: 0 % (ref 0.0–0.2)

## 2020-07-13 LAB — FERRITIN: Ferritin: 46 ng/mL (ref 11–307)

## 2020-07-13 LAB — GLUCOSE, CAPILLARY
Glucose-Capillary: 90 mg/dL (ref 70–99)
Glucose-Capillary: 97 mg/dL (ref 70–99)
Glucose-Capillary: 119 mg/dL — ABNORMAL HIGH (ref 70–99)
Glucose-Capillary: 101 mg/dL — ABNORMAL HIGH (ref 70–99)
Glucose-Capillary: 93 mg/dL (ref 70–99)

## 2020-07-13 LAB — PREALBUMIN: Prealbumin: 12.8 mg/dL — ABNORMAL LOW (ref 18–38)

## 2020-07-13 LAB — IRON AND TIBC
Iron: 22 ug/dL — ABNORMAL LOW (ref 28–170)
Saturation Ratios: 8 % — ABNORMAL LOW (ref 10.4–31.8)
TIBC: 269 ug/dL (ref 250–450)
UIBC: 247 ug/dL

## 2020-07-13 LAB — HEMOGLOBIN A1C
Hgb A1c MFr Bld: 5.8 % — ABNORMAL HIGH (ref 4.8–5.6)
Mean Plasma Glucose: 119.76 mg/dL

## 2020-07-13 LAB — TSH: TSH: 5.998 u[IU]/mL — ABNORMAL HIGH (ref 0.350–4.500)

## 2020-07-13 LAB — MRSA PCR SCREENING: MRSA by PCR: NEGATIVE

## 2020-07-13 LAB — FOLATE: Folate: 35 ng/mL (ref >5.9)

## 2020-07-13 LAB — SARS CORONAVIRUS 2 BY RT PCR (HOSPITAL ORDER, PERFORMED IN ~~LOC~~ HOSPITAL LAB): SARS Coronavirus 2: NEGATIVE

## 2020-07-13 LAB — PHOSPHORUS: Phosphorus: 3.8 mg/dL (ref 2.5–4.6)

## 2020-07-13 LAB — VITAMIN B12: Vitamin B-12: 1048 pg/mL — ABNORMAL HIGH (ref 180–914)

## 2020-07-13 LAB — MAGNESIUM: Magnesium: 1.8 mg/dL (ref 1.7–2.4)

## 2020-07-13 MED ORDER — ROSUVASTATIN CALCIUM 10 MG PO TABS
20.0000 mg | ORAL_TABLET | Freq: Every day | ORAL | Status: DC
  Administered 2020-07-13 – 2020-08-18 (×31): 20 mg via ORAL
  Filled 2020-07-13 (×21): qty 2
  Filled 2020-07-13: qty 1
  Filled 2020-07-13 (×14): qty 2
  Filled 2020-07-13: qty 1

## 2020-07-13 MED ORDER — SODIUM CHLORIDE 0.9 % IV SOLN: INTRAVENOUS | Status: AC

## 2020-07-13 MED ORDER — ONDANSETRON HCL 4 MG PO TABS: 4.0000 mg | ORAL_TABLET | Freq: Four times a day (QID) | ORAL | Status: DC | PRN

## 2020-07-13 MED ORDER — HYDROCODONE-ACETAMINOPHEN 5-325 MG PO TABS
1.0000 | ORAL_TABLET | ORAL | Status: DC | PRN
  Administered 2020-07-13 (×3): 2 via ORAL
  Administered 2020-07-14 (×2): 1 via ORAL
  Administered 2020-07-14: 2 via ORAL
  Administered 2020-07-15 – 2020-07-17 (×8): 1 via ORAL
  Administered 2020-07-18: 2 via ORAL
  Administered 2020-07-18 – 2020-07-19 (×2): 1 via ORAL
  Filled 2020-07-13 (×3): qty 1
  Filled 2020-07-13: qty 2
  Filled 2020-07-13: qty 1
  Filled 2020-07-13: qty 2
  Filled 2020-07-13: qty 1
  Filled 2020-07-13: qty 2
  Filled 2020-07-13 (×2): qty 1
  Filled 2020-07-13: qty 2
  Filled 2020-07-13 (×4): qty 1
  Filled 2020-07-13: qty 2
  Filled 2020-07-13: qty 1

## 2020-07-13 MED ORDER — SODIUM CHLORIDE 0.9% FLUSH
3.0000 mL | Freq: Two times a day (BID) | INTRAVENOUS | Status: DC
  Administered 2020-07-13 – 2020-08-23 (×71): 3 mL via INTRAVENOUS

## 2020-07-13 MED ORDER — SODIUM CHLORIDE 0.9 % IV SOLN
2.0000 g | INTRAVENOUS | Status: DC
  Administered 2020-07-14 – 2020-07-16 (×3): 2 g via INTRAVENOUS
  Filled 2020-07-13 (×3): qty 20

## 2020-07-13 MED ORDER — DOCUSATE SODIUM 100 MG PO CAPS
100.0000 mg | ORAL_CAPSULE | Freq: Two times a day (BID) | ORAL | Status: DC
  Administered 2020-07-13 – 2020-07-29 (×26): 100 mg via ORAL
  Filled 2020-07-13 (×30): qty 1

## 2020-07-13 MED ORDER — GADOBUTROL 1 MMOL/ML IV SOLN
7.0000 mL | Freq: Once | INTRAVENOUS | Status: AC | PRN
  Administered 2020-07-13: 7 mL via INTRAVENOUS
  Filled 2020-07-13: qty 7.5

## 2020-07-13 MED ORDER — ACETAMINOPHEN 325 MG PO TABS
650.0000 mg | ORAL_TABLET | Freq: Four times a day (QID) | ORAL | Status: DC | PRN
  Administered 2020-07-13 – 2020-08-13 (×20): 650 mg via ORAL
  Filled 2020-07-13 (×19): qty 2

## 2020-07-13 MED ORDER — ACETAMINOPHEN 650 MG RE SUPP
650.0000 mg | Freq: Four times a day (QID) | RECTAL | Status: DC | PRN
  Administered 2020-07-24: 650 mg via RECTAL
  Filled 2020-07-13: qty 1

## 2020-07-13 MED ORDER — SODIUM CHLORIDE 0.9 % IV SOLN: 2.0000 g | Freq: Two times a day (BID) | INTRAVENOUS | Status: DC

## 2020-07-13 MED ORDER — VANCOMYCIN HCL 1500 MG/300ML IV SOLN
1500.0000 mg | Freq: Once | INTRAVENOUS | Status: AC
  Administered 2020-07-13: 1500 mg via INTRAVENOUS
  Filled 2020-07-13: qty 300

## 2020-07-13 MED ORDER — SODIUM CHLORIDE 0.9 % IV SOLN
2.0000 g | Freq: Two times a day (BID) | INTRAVENOUS | Status: DC
  Administered 2020-07-13: 2 g via INTRAVENOUS
  Filled 2020-07-13: qty 20

## 2020-07-13 MED ORDER — PANTOPRAZOLE SODIUM 20 MG PO TBEC
20.0000 mg | DELAYED_RELEASE_TABLET | Freq: Every day | ORAL | Status: DC
  Administered 2020-07-13 – 2020-07-18 (×6): 20 mg via ORAL
  Filled 2020-07-13 (×7): qty 1

## 2020-07-13 MED ORDER — VANCOMYCIN HCL 1500 MG/300ML IV SOLN
1500.0000 mg | INTRAVENOUS | Status: DC
  Administered 2020-07-13: 1500 mg via INTRAVENOUS
  Filled 2020-07-13 (×2): qty 300

## 2020-07-13 MED ORDER — POTASSIUM CHLORIDE CRYS ER 20 MEQ PO TBCR
40.0000 meq | EXTENDED_RELEASE_TABLET | Freq: Once | ORAL | Status: AC
  Administered 2020-07-13: 40 meq via ORAL
  Filled 2020-07-13: qty 2

## 2020-07-13 MED ORDER — DEXTROSE 5 % IV SOLN
10.0000 mg/kg | Freq: Three times a day (TID) | INTRAVENOUS | Status: DC
  Administered 2020-07-13 – 2020-07-18 (×13): 645 mg via INTRAVENOUS
  Filled 2020-07-13 (×18): qty 12.9

## 2020-07-13 MED ORDER — ONDANSETRON HCL 4 MG/2ML IJ SOLN
4.0000 mg | Freq: Four times a day (QID) | INTRAMUSCULAR | Status: DC | PRN
  Administered 2020-07-13: 4 mg via INTRAVENOUS
  Filled 2020-07-13: qty 2

## 2020-07-13 NOTE — Progress Notes (Addendum)
Pharmacy Antibiotic Note  Casey Baldwin is a 79 y.o. female admitted on 07/12/2020 with possible meningitis (r/o).  Pharmacy has been consulted for Vancomycin dosing.  Plan: Rocephin 2gm IV q12hrs (d/w provider) Vancomycin 1500mg  x 1 loading dose now, f/u additional dosing Continue Acyclovir 10mg /Kg q8h for now ID consult pending  Height: 5\' 4"  (162.6 cm) Weight: 79.4 kg (175 lb) IBW/kg (Calculated) : 54.7  Temp (24hrs), Avg:99.6 F (37.6 C), Min:98.4 F (36.9 C), Max:101 F (38.3 C)  Recent Labs  Lab 07/08/20 2213 07/09/20 0139 07/10/20 0753 07/12/20 0919 07/12/20 2108  WBC 8.0  --  7.2 6.3 5.5  CREATININE 0.94  --  1.02* 1.01*  --   LATICACIDVEN  --  0.5  --   --  0.9    Estimated Creatinine Clearance: 45.3 mL/min (A) (by C-G formula based on SCr of 1.01 mg/dL (H)).    Allergies  Allergen Reactions  . Lidocaine Anaphylaxis and Other (See Comments)    Became unconscious with administration for dental procedure at age 68yo. Pt tolerated subsequent lidocaine.    . Amlodipine Other (See Comments)    Hand and leg cramping  . Lactose Diarrhea  . Lactose Intolerance (Gi)   . Latex     Antimicrobials this admission:   >>    >>   Dose adjustments this admission:   Microbiology results:  BCx:   UCx:    Sputum:    MRSA PCR:   Thank you for allowing pharmacy to be a part of this patient's care.  09/11/20 A 07/13/2020 12:08 AM

## 2020-07-13 NOTE — Consult Note (Signed)
NAME: Casey Baldwin  DOB: 1941-03-13  MRN: [id number]  Date/Time: 07/13/2020 7:35 PM  REQUESTING PROVIDER: Marylu Lund Subjective:  REASON FOR CONSULT: fever, shingles ? Casey Baldwin is a 79 y.o. female with a history of CAD, HTN, gastric bypass admitted with headache, leg pain, fever of nearly 6 days along with a painful rash on the rt side of her back and chest. Pt had presented three times to the ED in the past week with headache, leg pain and rt sided chest pain on 8/29, 8/30 and 8/31 . Underwent CTA on 07/10/20 and was r/o for PE and sent home. She returned on 9/2 with headache and fever . Daughter noted small insect bite like lesions on her back. She did not receive any tick bites   Pt has had a prolonged stay at [facility] between 04/01/20 until 05/14/20 for abdominal pain which was diagnosed as choledocholithiasis/cholangitis in a setting of prior RYGB in 2008. 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 She was discharged to rehab for a few days and then went home- As per daughter she had good and bad days at home. Weak and tired many days and feeling betetr other times Also they have recently noted the there is swelling of rt side of the upper abdomen / She had one episode of vomiting today She says she had meningitis when she was in 11th grade . C/o dysuria Past Medical History:  Diagnosis Date  . Coronary artery disease   . High cholesterol   . Hypertension     Past Surgical History:  Procedure  Laterality Date  . ABDOMINAL HYSTERECTOMY    . COLON RESECTION    . FACIAL COSMETIC SURGERY    . FRACTURE SURGERY     left leg and left ankle.  Marland Kitchen GASTRIC BYPASS    . left knee replacement      Social History   Socioeconomic History  . Marital status: Widowed    Spouse name: Not on file  . Number of children: Not on file  . Years of education: Not on file  . Highest education level: Not on file  Occupational History  . Not on file  Tobacco Use  . Smoking status: Never Smoker  . Smokeless tobacco: Never Used  Vaping Use  . Vaping Use: Never used  Substance and Sexual Activity  . Alcohol use: No  . Drug use: No  . Sexual activity: Not on file  Other Topics Concern  . Not on file  Social History Narrative  . Not on file   Social Determinants of Health   Financial Resource Strain:   . Difficulty of Paying Living Expenses: Not on file  Food Insecurity:   . Worried About Programme researcher, broadcasting/film/video in the Last Year: Not on file  . Ran Out of Food in the Last Year: Not on file  Transportation Needs:   . Lack of Transportation (Medical): Not on file  . Lack of Transportation (Non-Medical): Not on file  Physical Activity:   . Days of Exercise  per Week: Not on file  . Minutes of Exercise per Session: Not on file  Stress:   . Feeling of Stress : Not on file  Social Connections:   . Frequency of Communication with Friends and Family: Not on file  . Frequency of Social Gatherings with Friends and Family: Not on file  . Attends Religious Services: Not on file  . Active Member of Clubs or Organizations: Not on file  . Attends Banker Meetings: Not on file  . Marital Status: Not on file  Intimate Partner Violence:   . Fear of Current or Ex-Partner: Not on file  . Emotionally Abused: Not on file  . Physically Abused: Not on file  . Sexually Abused: Not on file    Family History  Problem Relation Age of Onset  . Colon cancer Mother    Allergies  Allergen Reactions    . Lidocaine Anaphylaxis and Other (See Comments)    Became unconscious with administration for dental procedure at age 27yo. Pt tolerated subsequent lidocaine.    . Amlodipine Other (See Comments)    Hand and leg cramping  . Lactose Diarrhea  . Lactose Intolerance (Gi)   . Latex     ? Current Facility-Administered Medications  Medication Dose Route Frequency Provider Last Rate Last Admin  . acetaminophen (TYLENOL) tablet 650 mg  650 mg Oral Q6H PRN Therisa Doyne, MD   650 mg at 07/13/20 1645   Or  . acetaminophen (TYLENOL) suppository 650 mg  650 mg Rectal Q6H PRN Doutova, Anastassia, MD      . acyclovir (ZOVIRAX) 645 mg in dextrose 5 % 100 mL IVPB  10 mg/kg (Adjusted) Intravenous Q8H Marty Heck, RPH   Stopped at 07/13/20 1150  . cefTRIAXone (ROCEPHIN) 2 g in sodium chloride 0.9 % 100 mL IVPB  2 g Intravenous Q12H Marty Heck, [facility]   Stopped at 07/13/20 1613  . docusate sodium (COLACE) capsule 100 mg  100 mg Oral BID Doutova, Anastassia, MD   100 mg at 07/13/20 1030  . HYDROcodone-acetaminophen (NORCO/VICODIN) 5-325 MG per tablet 1-2 tablet  1-2 tablet Oral Q4H PRN Therisa Doyne, MD   2 tablet at 07/13/20 1934  . insulin aspart (novoLOG) injection 0-9 Units  0-9 Units Subcutaneous Q4H Doutova, Anastassia, MD      . ondansetron (ZOFRAN) tablet 4 mg  4 mg Oral Q6H PRN Doutova, Anastassia, MD       Or  . ondansetron (ZOFRAN) injection 4 mg  4 mg Intravenous Q6H PRN Doutova, Anastassia, MD   4 mg at 07/13/20 1148  . pantoprazole (PROTONIX) EC tablet 20 mg  20 mg Oral Daily Doutova, Anastassia, MD   20 mg at 07/13/20 1030  . rosuvastatin (CRESTOR) tablet 20 mg  20 mg Oral Daily Doutova, Anastassia, MD   20 mg at 07/13/20 1030  . sodium chloride flush (NS) 0.9 % injection 3 mL  3 mL Intravenous Q12H Doutova, Anastassia, MD   3 mL at 07/13/20 0206  . vancomycin (VANCOREADY) IVPB 1500 mg/300 mL  1,500 mg Intravenous Q24H Marty Heck, [facility]       Current Outpatient  Medications  Medication Sig Dispense Refill  . Acetaminophen 500 MG capsule Take 500 mg by mouth daily as needed.     . Artificial Tear Ointment (DRY EYES OP) Place 1-2 drops into both eyes daily as needed.    Marland Kitchen aspirin 81 MG EC tablet Take 81 mg by mouth daily.     Marland Kitchen  bisacodyl (DULCOLAX) 5 MG EC tablet Take 10 mg by mouth at bedtime.     . calcium carbonate (OS-CAL) 1250 (500 Ca) MG chewable tablet Chew 2 tablets by mouth daily.    . Cholecalciferol (VITAMIN D PO) Take 1 tablet by mouth daily.    . furosemide (LASIX) 20 MG tablet Take 20-40 mg by mouth daily. CAN TAKE 40MG  FOR SWELLING    . isosorbide mononitrate (IMDUR) 30 MG 24 hr tablet Take 30 mg by mouth daily.    . Multiple Vitamin (MULTIVITAMIN WITH MINERALS) TABS tablet Take 1 tablet by mouth daily.    . Multiple Vitamins-Minerals (ICAPS AREDS 2 PO) Take 1 capsule by mouth daily.    . ondansetron (ZOFRAN) 4 MG tablet Take 4 mg by mouth daily as needed.    . pantoprazole (PROTONIX) 20 MG tablet Take 20 mg by mouth daily.    . rosuvastatin (CRESTOR) 20 MG tablet Take 20 mg by mouth daily.    senna-docusate (SENOKOT-S) 8.6-50 MG tablet Take 2 tablets by mouth 2 (two) times daily. (Patient not taking: Reported on 04/01/2020) 120 tablet 0  . witch hazel-glycerin (TUCKS) pad Apply 1 application topically as needed for itching. (Patient not taking: Reported on 07/10/2020) 40 each 12     Abtx:  Anti-infectives (From admission, onward)   Start     Dose/Rate Route Frequency Ordered Stop   07/14/20 0100  cefTRIAXone (ROCEPHIN) 2 g in sodium chloride 0.9 % 100 mL IVPB  Status:  Discontinued        2 g 200 mL/hr over 30 Minutes Intravenous Every 12 hours 07/13/20 0005 07/13/20 0913   07/13/20 2200  vancomycin (VANCOREADY) IVPB 1500 mg/300 mL        1,500 mg 150 mL/hr over 120 Minutes Intravenous Every 24 hours 07/13/20 0927     07/13/20 1100  acyclovir (ZOVIRAX) 645 mg in dextrose 5 % 100 mL IVPB        10 mg/kg  64.6 kg (Adjusted) 112.9  mL/hr over 60 Minutes Intravenous Every 8 hours 07/13/20 0946     07/13/20 1000  cefTRIAXone (ROCEPHIN) 2 g in sodium chloride 0.9 % 100 mL IVPB        2 g 200 mL/hr over 30 Minutes Intravenous Every 12 hours 07/13/20 0913     07/13/20 0100  vancomycin (VANCOREADY) IVPB 1500 mg/300 mL        1,500 mg 150 mL/hr over 120 Minutes Intravenous  Once 07/13/20 0005 07/13/20 0448   07/12/20 2330  acyclovir (ZOVIRAX) 795 mg in dextrose 5 % 150 mL IVPB  Status:  Discontinued        10 mg/kg  79.4 kg 165.9 mL/hr over 60 Minutes Intravenous Every 8 hours 07/12/20 2230 07/13/20 0946   07/12/20 2200  doxycycline (VIBRAMYCIN) 100 mg in sodium chloride 0.9 % 250 mL IVPB        100 mg 125 mL/hr over 120 Minutes Intravenous  Once 07/12/20 2115 07/13/20 0036   07/12/20 2130  ceFEPIme (MAXIPIME) 1 g in sodium chloride 0.9 % 100 mL IVPB        1 g 200 mL/hr over 30 Minutes Intravenous  Once 07/12/20 2115 07/12/20 2232      REVIEW OF SYSTEMS:  Const:  fever, negative chills, negative weight loss Eyes: negative diplopia or visual changes, negative eye pain ENT: negative coryza, negative sore throat Resp: negative cough, hemoptysis, dyspnea Cards:rt chest pain, no palpitations, lower extremity edema GU: negative for frequency, dysuria and hematuria  GI:  abdominal pain,  constipation Skin:  rash painful and itchy Heme: negative for easy bruising and gum/nose bleeding MS: generalized weakness Neuroloheadaches, neck pain  Psych: negative for feelings of anxiety, depression  Endocrine: negative for thyroid, diabetes Allergy/Immunology-as above Objective:  VITALS:  BP (!) 143/85 (BP Location: Left Arm)   Pulse 78   Temp (!) 102.9 F (39.4 C) (Oral)   Resp 15   Ht 5\' 4"  (1.626 m)   Wt 79.4 kg   SpO2 98%   BMI 30.04 kg/m  PHYSICAL EXAM:  General: Alert, cooperative, hard of hearing no distress at the current movt , oriented x4 Head: Normocephalic, without obvious abnormality, atraumatic. Eyes:  Conjunctivae clear, anicteric sclerae. Pupils are equal ENT Nares normal. No drainage or sinus tenderness. Lips, mucosa, and tongue normal. No Thrush Neck: minimal stiffness no carotid bruit and no JVD. Back: No CVA tenderness. There is some fullness on the rt side of there abdomen, but I dont appreciate tenderness, warmth , erythema Abdominal wall hernia Lap scars Lungs: Clear to auscultation bilaterally. No Wheezing or Rhonchi. No rales. Heart: Regular rate and rhythm, no murmur, rub or gallop. Abdomen: Soft, non-tender,not distended. Bowel sounds normal. No masses Extremities: atraumatic, no cyanosis. No edema. No clubbing Skin: erythematous, puctate centered lesions on the rt side of the back and a few in the front under her breast        Lymph: Cervical, supraclavicular normal. Neurologic: Grossly non-focal Pertinent Labs Lab Results CBC    Component Value Date/Time   WBC 4.9 07/13/2020 0710   RBC 2.92 (L) 07/13/2020 0710   RBC 2.85 (L) 07/13/2020 0710   HGB 9.2 (L) 07/13/2020 0710   HCT 28.3 (L) 07/13/2020 0710   PLT 227 07/13/2020 0710   MCV 99.3 07/13/2020 0710   MCH 32.3 07/13/2020 0710   MCHC 32.5 07/13/2020 0710   RDW 14.6 07/13/2020 0710   LYMPHSABS 1.2 07/13/2020 0710   MONOABS 0.6 07/13/2020 0710   EOSABS 0.1 07/13/2020 0710   BASOSABS 0.0 07/13/2020 0710    CMP Latest Ref Rng & Units 07/13/2020 07/12/2020 07/10/2020  Glucose 70 - 99 mg/dL 528(U108(H) 132(G128(H) 401(U171(H)  BUN 8 - 23 mg/dL 15 16 12   Creatinine 0.44 - 1.00 mg/dL 2.720.86 5.36(U1.01(H) 4.40(H1.02(H)  Sodium 135 - 145 mmol/L 134(L) 136 133(L)  Potassium 3.5 - 5.1 mmol/L 3.3(L) 3.8 3.6  Chloride 98 - 111 mmol/L 102 99 100  CO2 22 - 32 mmol/L 23 24 25   Calcium 8.9 - 10.3 mg/dL 8.3(L) 8.8(L) 8.7(L)  Total Protein 6.5 - 8.1 g/dL 6.3(L) 7.1 7.1  Total Bilirubin 0.3 - 1.2 mg/dL 0.9 1.1 1.1  Alkaline Phos 38 - 126 U/L 55 62 61  AST 15 - 41 U/L 14(L) 14(L) 16  ALT 0 - 44 U/L 11 12 15       Microbiology: Recent Results  (from the past 240 hour(s))  Culture, blood (routine x 2)     Status: None (Preliminary result)   Collection Time: 07/09/20  1:39 AM   Specimen: BLOOD  Result Value Ref Range Status   Specimen Description BLOOD RIGHT AC  Final   Special Requests   Final    BOTTLES DRAWN AEROBIC AND ANAEROBIC Blood Culture results may not be optimal due to an excessive volume of blood received in culture bottles   Culture   Final    NO GROWTH 4 DAYS Performed at [facility], [address] [place], [place] [id number]    Report Status PENDING  Incomplete  SARS Coronavirus 2 by RT PCR (hospital order, performed in [facility] hospital lab) Nasopharyngeal Nasopharyngeal Swab     Status: None   Collection Time: 07/09/20  2:49 AM   Specimen: Nasopharyngeal Swab  Result Value Ref Range Status   SARS Coronavirus 2 NEGATIVE NEGATIVE Final    Comment: (NOTE) SARS-CoV-2 target nucleic acids are NOT DETECTED.  The SARS-CoV-2 RNA is generally detectable in upper and lower respiratory specimens during the acute phase of infection. The lowest concentration of SARS-CoV-2 viral copies this assay can detect is 250 copies / mL. A negative result does not preclude SARS-CoV-2 infection and should not be used as the sole basis for treatment or other patient management decisions.  A negative result may occur with improper specimen collection / handling, submission of specimen other than nasopharyngeal swab, presence of viral mutation(s) within the areas targeted by this assay, and inadequate number of viral copies (<250 copies / mL). A negative result must be combined with clinical observations, patient history, and epidemiological information.  Fact Sheet for Patients:   BoilerBrush.com.cy  Fact Sheet for Healthcare Providers: https://pope.com/  This test is not yet approved or  cleared by the [place] FDA and has been authorized for detection and/or  diagnosis of SARS-CoV-2 by FDA under an Emergency Use Authorization (EUA).  This EUA will remain in effect (meaning this test can be used) for the duration of the COVID-19 declaration under Section 564(b)(1) of the Act, 21 U.S.C. section 360bbb-3(b)(1), unless the authorization is terminated or revoked sooner.  Performed at [facility], [address] [place], [place] [address]   SARS Coronavirus 2 by RT PCR (hospital order, performed in [facility] hospital lab) Nasopharyngeal Nasopharyngeal Swab     Status: None   Collection Time: 07/10/20  2:48 PM   Specimen: Nasopharyngeal Swab  Result Value Ref Range Status   SARS Coronavirus 2 NEGATIVE NEGATIVE Final    Comment: (NOTE) SARS-CoV-2 target nucleic acids are NOT DETECTED.  The SARS-CoV-2 RNA is generally detectable in upper and lower respiratory specimens during the acute phase of infection. The lowest concentration of SARS-CoV-2 viral copies this assay can detect is 250 copies / mL. A negative result does not preclude SARS-CoV-2 infection and should not be used as the sole basis for treatment or other patient management decisions.  A negative result may occur with improper specimen collection / handling, submission of specimen other than nasopharyngeal swab, presence of viral mutation(s) within the areas targeted by this assay, and inadequate number of viral copies (<250 copies / mL). A negative result must be combined with clinical observations, patient history, and epidemiological information.  Fact Sheet for Patients:   BoilerBrush.com.cy  Fact Sheet for Healthcare Providers: https://pope.com/  This test is not yet approved or  cleared by the [place] FDA and has been authorized for detection and/or diagnosis of SARS-CoV-2 by FDA under an Emergency Use Authorization (EUA).  This EUA will remain in effect (meaning this test can be used) for the duration of  the COVID-19 declaration under Section 564(b)(1) of the Act, 21 U.S.C. section 360bbb-3(b)(1), unless the authorization is terminated or revoked sooner.  Performed at [facility], [address] [place], [place] [address]   Culture, blood (routine x 2)     Status: None (Preliminary result)   Collection Time: 07/12/20  9:23 PM   Specimen: BLOOD  Result Value Ref Range Status   Specimen Description BLOOD RIGHT [facility]  Final   Special Requests  Final    BOTTLES DRAWN AEROBIC AND ANAEROBIC Blood Culture results may not be optimal due to an excessive volume of blood received in culture bottles   Culture   Final    NO GROWTH < 12 HOURS Performed at [facility], [address] [place], [place] [address]    Report Status PENDING  Incomplete  Culture, blood (routine x 2)     Status: None (Preliminary result)   Collection Time: 07/12/20  9:23 PM   Specimen: BLOOD  Result Value Ref Range Status   Specimen Description BLOOD RIGHT WRIST  Final   Special Requests   Final    BOTTLES DRAWN AEROBIC AND ANAEROBIC Blood Culture adequate volume   Culture   Final    NO GROWTH < 12 HOURS Performed at [facility], [address] [place], [place] [address]    Report Status PENDING  Incomplete  SARS Coronavirus 2 by RT PCR (hospital order, performed in [facility] hospital lab) Nasopharyngeal Nasopharyngeal Swab     Status: None   Collection Time: 07/12/20 11:13 PM   Specimen: Nasopharyngeal Swab  Result Value Ref Range Status   SARS Coronavirus 2 NEGATIVE NEGATIVE Final    Comment: (NOTE) SARS-CoV-2 target nucleic acids are NOT DETECTED.  The SARS-CoV-2 RNA is generally detectable in upper and lower respiratory specimens during the acute phase of infection. The lowest concentration of SARS-CoV-2 viral copies this assay can detect is 250 copies / mL. A negative result does not preclude SARS-CoV-2 infection and should not be used as the sole basis for treatment or  other patient management decisions.  A negative result may occur with improper specimen collection / handling, submission of specimen other than nasopharyngeal swab, presence of viral mutation(s) within the areas targeted by this assay, and inadequate number of viral copies (<250 copies / mL). A negative result must be combined with clinical observations, patient history, and epidemiological information.  Fact Sheet for Patients:   BoilerBrush.com.cy  Fact Sheet for Healthcare Providers: https://pope.com/  This test is not yet approved or  cleared by the [place] FDA and has been authorized for detection and/or diagnosis of SARS-CoV-2 by FDA under an Emergency Use Authorization (EUA).  This EUA will remain in effect (meaning this test can be used) for the duration of the COVID-19 declaration under Section 564(b)(1) of the Act, 21 U.S.C. section 360bbb-3(b)(1), unless the authorization is terminated or revoked sooner.  Performed at [facility], [address] [place], [place] [address]   MRSA PCR Screening     Status: None   Collection Time: 07/13/20  2:02 AM   Specimen: Nasal Mucosa; Nasopharyngeal  Result Value Ref Range Status   MRSA by PCR NEGATIVE NEGATIVE Final    Comment:        The GeneXpert MRSA Assay (FDA approved for NASAL specimens only), is one component of a comprehensive MRSA colonization surveillance program. It is not intended to diagnose MRSA infection nor to guide or monitor treatment for MRSA infections. Performed at [facility], [address] [place], [place] [address]     IMAGING RESULTS: CXR no infiltrate MRI brain normal  I have personally reviewed the films ? Impression/Recommendation ? Fever, headache, painful rash on the rt side of the back and chest with radiating pain from back to front  suggestive of herpes zoster in a dermatomal fashion on the rt. Fever is  less likely in zoster but raises concern for zoster meningitis MRI normal She does  not appear to have bacterial meningitis clinically She did not want to get LP Agree with acyclovir Would treat with ceftriaxone once a day instead of q12. vanco  ?she recently had a complicated stay at [facility] between  04/01/20 until 05/14/20 for abdominal pain which was diagnosed as choledocholithiasis/cholangitis in a setting of prior RYGB in 2008. 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   Keep an eye on the rt side abdominal firmness- may need CT abdomen Has normal LFTS now.  If blood culture is negative tomorrow will DC vanco as with acyclovir risk for renal issues  R/o UTI- urine culture pending  Hearing loss   Left TKA Fully vaccinated for COVID jan 2021  Discussed the management with patient and her daughter ? ID will follow her peripherally this weekend- call if needed

## 2020-07-13 NOTE — Progress Notes (Signed)
PROGRESS NOTE    Casey Baldwin  PZW:[id number] DOB: 1940/11/24 DOA: 07/12/2020 PCP: Annice Needy, MD    Brief Narrative:  Casey Baldwin is a 79 y.o. 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 Her temperature at home was up to 102.2 Patient was very weak and had hard time getting up Family recently noted a rash that was going around her right side of back and right breast.  Rash is intensely pruritic different stages of healing some areas of vesicular be red border.  Some areas covered in black eschars.  Skin is diffusely tender.Per family (asked by me) pt developed confusion. Was c/o side to back pain.  Sed rate was elevated.  LP was considered but patient declined stating that she had a history of meningitis in the past and very scared of having LP done.  Hospitalist was called for admission for shingles and possible encephalitis.    Consultants:   ID  Procedures:   Antimicrobials:   Acyclovir IV started on 9/3  Cefepime IV started on 9/2 x1   Ceftriaxone 2 g started on 9/3  Vancomycin started on 9/3   Subjective: Daughter at bedside.  Stated patient was confused yesterday and today her confusion has improved some.  Patient has no complaints of shortness of breath.  Complains of achiness around her chest and back.  No nausea or vomiting.  No headaches this AM.  Objective: Vitals:   07/12/20 2034 07/12/20 2205 07/13/20 0446 07/13/20 0700  BP: (!) 178/82 (!) 162/74  (!) 148/76  Pulse: 83 78  71  Resp: '18 19 14 15  ' Temp: (!) 101 F (38.3 C) 98.7 F (37.1 C)  98.7 F (37.1 C)  TempSrc: Oral Oral  Oral  SpO2: 96% 95%  96%  Weight:      Height:       No intake or output data in the 24 hours ending 07/13/20 1623 Filed Weights   07/12/20 0918 07/12/20 0921  Weight: 79.4 kg 79.4 kg     Examination:  General exam: Appears calm and comfortable  Respiratory system: Clear to auscultation. Respiratory effort normal. Cardiovascular system: S1 & S2 heard, RRR. No JVD, murmurs, rubs, gallops or clicks. No pedal edema. Gastrointestinal system: Abdomen is nondistended, soft and nontender. No organomegaly or masses felt. Normal bowel sounds heard. Central nervous system: Alert and oriented x3.  Grossly intact Extremities: No edema Skin: Warm dry Back: 3 small lesions. Mid chest with rash/small crusted lesion Psychiatry: Judgement and insight appear normal. Mood & affect appropriate.     Data Reviewed: I have personally reviewed following labs and imaging studies  CBC: Recent Labs  Lab 07/08/20 2213 07/10/20 0753 07/12/20 0919 07/12/20 2108 07/13/20 0710  WBC 8.0 7.2 6.3 5.5 4.9  NEUTROABS  --  5.5  --  3.8 3.0  HGB 9.8* 10.2* 10.9* 9.9* 9.2*  HCT 30.4* 31.7* 32.3* 30.2* 28.3*  MCV 99.3 100.0 95.8 98.4 99.3  PLT 261 258 269 246 423   Basic Metabolic Panel: Recent Labs  Lab 07/08/20 2213 07/10/20 0753 07/12/20 0919 07/12/20 2108 07/13/20 0710  NA 133* 133* 136  --  134*  K 3.7 3.6 3.8  --  3.3*  CL 102 100 99  --  102  CO2 '23 25 24  ' --  23  GLUCOSE 128* 171* 128*  --  108*  BUN '13 12 16  ' --  15  CREATININE 0.94 1.02* 1.01*  --  0.86  CALCIUM 8.4* 8.7* 8.8*  --  8.3*  MG  --   --   --  1.8 1.8  PHOS  --   --   --   --  3.8   GFR: Estimated Creatinine Clearance: 53.2 mL/min (by C-G formula based on SCr of 0.86 mg/dL). Liver Function Tests: Recent Labs  Lab 07/10/20 0753 07/12/20 2108 07/13/20 0710  AST 16 14* 14*  ALT '15 12 11  ' ALKPHOS 61 62 55  BILITOT 1.1 1.1 0.9  PROT 7.1 7.1 6.3*  ALBUMIN 3.8 3.6 3.3*   Recent Labs  Lab 07/12/20 2108  LIPASE 27   No results for input(s): AMMONIA in the last 168 hours. Coagulation Profile: No results for input(s): INR, PROTIME in the last 168 hours. Cardiac Enzymes: Recent Labs  Lab  07/10/20 0753 07/12/20 2108  CKTOTAL 90 46   BNP (last 3 results) No results for input(s): PROBNP in the last 8760 hours. HbA1C: Recent Labs    07/13/20 0710  HGBA1C 5.8*   CBG: Recent Labs  Lab 07/13/20 0439 07/13/20 0804 07/13/20 1159  GLUCAP 90 97 119*   Lipid Profile: No results for input(s): CHOL, HDL, LDLCALC, TRIG, CHOLHDL, LDLDIRECT in the last 72 hours. Thyroid Function Tests: Recent Labs    07/13/20 0710  TSH 5.998*   Anemia Panel: Recent Labs    07/13/20 0710  VITAMINB12 1,048*  FOLATE 35.0  FERRITIN 46  TIBC 269  IRON 22*  RETICCTPCT 1.5   Sepsis Labs: Recent Labs  Lab 07/09/20 0139 07/12/20 2108  PROCALCITON  --  <0.10  LATICACIDVEN 0.5 0.9    Recent Results (from the past 240 hour(s))  Culture, blood (routine x 2)     Status: None (Preliminary result)   Collection Time: 07/09/20  1:39 AM   Specimen: BLOOD  Result Value Ref Range Status   Specimen Description BLOOD RIGHT AC  Final   Special Requests   Final    BOTTLES DRAWN AEROBIC AND ANAEROBIC Blood Culture results may not be optimal due to an excessive volume of blood received in culture bottles   Culture   Final    NO GROWTH 4 DAYS Performed at [facility], [address], [place], [place] [address]    Report Status PENDING  Incomplete  SARS Coronavirus 2 by RT PCR (hospital order, performed in [place] hospital lab) Nasopharyngeal Nasopharyngeal Swab     Status: None   Collection Time: 07/09/20  2:49 AM   Specimen: Nasopharyngeal Swab  Result Value Ref Range Status   SARS Coronavirus 2 NEGATIVE NEGATIVE Final    Comment: (NOTE) SARS-CoV-2 target nucleic acids are NOT DETECTED.  The SARS-CoV-2 RNA is generally detectable in upper and lower respiratory specimens during the acute phase of infection. The lowest concentration of SARS-CoV-2 viral copies this assay can detect is 250 copies / mL. A negative result does not preclude SARS-CoV-2 infection and should not be  used as the sole basis for treatment or other patient management decisions.  A negative result may occur with improper specimen collection / handling, submission of specimen other than nasopharyngeal swab, presence of viral mutation(s) within the areas targeted by this assay, and inadequate number of viral copies (<250 copies / mL). A negative result must be combined with clinical observations, patient history, and epidemiological information.  Fact Sheet for Patients:   StrictlyIdeas.no  Fact Sheet  for Healthcare Providers: BankingDealers.co.za  This test is not yet approved or  cleared by the [place] and has been authorized for detection and/or diagnosis of SARS-CoV-2 by FDA under an Emergency Use Authorization (EUA).  This EUA will remain in effect (meaning this test can be used) for the duration of the COVID-19 declaration under Section 564(b)(1) of the Act, 21 U.S.C. section 360bbb-3(b)(1), unless the authorization is terminated or revoked sooner.  Performed at [facility], [address] [place], [place] [address]   SARS Coronavirus 2 by RT PCR (hospital order, performed in [facility] hospital lab) Nasopharyngeal Nasopharyngeal Swab     Status: None   Collection Time: 07/10/20  2:48 PM   Specimen: Nasopharyngeal Swab  Result Value Ref Range Status   SARS Coronavirus 2 NEGATIVE NEGATIVE Final    Comment: (NOTE) SARS-CoV-2 target nucleic acids are NOT DETECTED.  The SARS-CoV-2 RNA is generally detectable in upper and lower respiratory specimens during the acute phase of infection. The lowest concentration of SARS-CoV-2 viral copies this assay can detect is 250 copies / mL. A negative result does not preclude SARS-CoV-2 infection and should not be used as the sole basis for treatment or other patient management decisions.  A negative result may occur with improper specimen collection / handling, submission  of specimen other than nasopharyngeal swab, presence of viral mutation(s) within the areas targeted by this assay, and inadequate number of viral copies (<250 copies / mL). A negative result must be combined with clinical observations, patient history, and epidemiological information.  Fact Sheet for Patients:   StrictlyIdeas.no  Fact Sheet for Healthcare Providers: BankingDealers.co.za  This test is not yet approved or  cleared by the [place] FDA and has been authorized for detection and/or diagnosis of SARS-CoV-2 by FDA under an Emergency Use Authorization (EUA).  This EUA will remain in effect (meaning this test can be used) for the duration of the COVID-19 declaration under Section 564(b)(1) of the Act, 21 U.S.C. section 360bbb-3(b)(1), unless the authorization is terminated or revoked sooner.  Performed at [facility], [address] [place], [place] [address]   Culture, blood (routine x 2)     Status: None (Preliminary result)   Collection Time: 07/12/20  9:23 PM   Specimen: BLOOD  Result Value Ref Range Status   Specimen Description BLOOD RIGHT [facility]  Final   Special Requests   Final    BOTTLES DRAWN AEROBIC AND ANAEROBIC Blood Culture results may not be optimal due to an excessive volume of blood received in culture bottles   Culture   Final    NO GROWTH < 12 HOURS Performed at [facility], [address], [place], [place] [address]    Report Status PENDING  Incomplete  Culture, blood (routine x 2)     Status: None (Preliminary result)   Collection Time: 07/12/20  9:23 PM   Specimen: BLOOD  Result Value Ref Range Status   Specimen Description BLOOD RIGHT WRIST  Final   Special Requests   Final    BOTTLES DRAWN AEROBIC AND ANAEROBIC Blood Culture adequate volume   Culture   Final    NO GROWTH < 12 HOURS Performed at [facility], [address] [place], [place] [address]    Report  Status PENDING  Incomplete  SARS Coronavirus 2 by RT PCR (hospital order, performed in [place] hospital lab) Nasopharyngeal Nasopharyngeal Swab     Status: None   Collection Time: 07/12/20 11:13 PM  Specimen: Nasopharyngeal Swab  Result Value Ref Range Status   SARS Coronavirus 2 NEGATIVE NEGATIVE Final    Comment: (NOTE) SARS-CoV-2 target nucleic acids are NOT DETECTED.  The SARS-CoV-2 RNA is generally detectable in upper and lower respiratory specimens during the acute phase of infection. The lowest concentration of SARS-CoV-2 viral copies this assay can detect is 250 copies / mL. A negative result does not preclude SARS-CoV-2 infection and should not be used as the sole basis for treatment or other patient management decisions.  A negative result may occur with improper specimen collection / handling, submission of specimen other than nasopharyngeal swab, presence of viral mutation(s) within the areas targeted by this assay, and inadequate number of viral copies (<250 copies / mL). A negative result must be combined with clinical observations, patient history, and epidemiological information.  Fact Sheet for Patients:   StrictlyIdeas.no  Fact Sheet for Healthcare Providers: BankingDealers.co.za  This test is not yet approved or  cleared by the [place] FDA and has been authorized for detection and/or diagnosis of SARS-CoV-2 by FDA under an Emergency Use Authorization (EUA).  This EUA will remain in effect (meaning this test can be used) for the duration of the COVID-19 declaration under Section 564(b)(1) of the Act, 21 U.S.C. section 360bbb-3(b)(1), unless the authorization is terminated or revoked sooner.  Performed at [facility], [address] [place], [place] [address]   MRSA PCR Screening     Status: None   Collection Time: 07/13/20  2:02 AM   Specimen: Nasal Mucosa; Nasopharyngeal  Result Value Ref  Range Status   MRSA by PCR NEGATIVE NEGATIVE Final    Comment:        The GeneXpert MRSA Assay (FDA approved for NASAL specimens only), is one component of a comprehensive MRSA colonization surveillance program. It is not intended to diagnose MRSA infection nor to guide or monitor treatment for MRSA infections. Performed at [facility], [address], [place], [place] [address]          Radiology Studies: DG Chest 2 View  Result Date: 07/12/2020 CLINICAL DATA:  Chest pain for 3 days EXAM: CHEST - 2 VIEW COMPARISON:  04/01/2020 FINDINGS: Normal heart size and mediastinal contours. No acute infiltrate or edema. No effusion or pneumothorax. No acute osseous findings. Postoperative epigastric region. IMPRESSION: No evidence of active disease. Electronically Signed   By: Monte Fantasia M.D.   On: 07/12/2020 10:26   MR BRAIN W WO CONTRAST  Result Date: 07/13/2020 CLINICAL DATA:  Encephalopathy.  Delirium. EXAM: MRI HEAD WITHOUT AND WITH CONTRAST TECHNIQUE: Multiplanar, multiecho pulse sequences of the brain and surrounding structures were obtained without and with intravenous contrast. CONTRAST:  67m GADAVIST GADOBUTROL 1 MMOL/ML IV SOLN COMPARISON:  None. FINDINGS: Brain: No acute infarct, acute hemorrhage or extra-axial collection. Normal white matter signal. There is generalized atrophy without lobar predilection. No chronic microhemorrhage. Normal midline structures. Vascular: Normal flow voids. Skull and upper cervical spine: Normal marrow signal. Sinuses/Orbits: Bilateral ocular lens replacements. Paranasal sinuses and mastoid air cells are clear. Other: None IMPRESSION: 1. No acute intracranial abnormality. 2. Generalized atrophy without lobar predilection. Electronically Signed   By: KUlyses JarredM.D.   On: 07/13/2020 02:05   UKoreaAbdomen Limited RUQ  Result Date: 07/12/2020 CLINICAL DATA:  Abnormal CT with right upper quadrant edema. EXAM: ULTRASOUND ABDOMEN LIMITED RIGHT  UPPER QUADRANT COMPARISON:  04/01/2020, 07/10/2020 FINDINGS: Gallbladder: Surgically absent Common bile duct: Diameter: 7 mm Liver: Echogenicity is within normal  limits. There is no focal parenchymal abnormality. Stable mild intrahepatic biliary duct dilation. Portal vein is patent on color Doppler imaging with normal direction of blood flow towards the liver. Other: No free fluid. IMPRESSION: 1. Cholecystectomy. 2. Mild intrahepatic biliary duct dilation unchanged since CT. Normal caliber of the common bile duct in a patient status post cholecystectomy. 3. Otherwise unremarkable exam. Evaluation of the right upper quadrant mesenteric edema seen on CT is limited by ultrasound. Electronically Signed   By: Randa Ngo M.D.   On: 07/12/2020 21:46        Scheduled Meds: . docusate sodium  100 mg Oral BID  . insulin aspart  0-9 Units Subcutaneous Q4H  . pantoprazole  20 mg Oral Daily  . rosuvastatin  20 mg Oral Daily  . sodium chloride flush  3 mL Intravenous Q12H   Continuous Infusions: . acyclovir Stopped (07/13/20 1150)  . cefTRIAXone (ROCEPHIN)  IV Stopped (07/13/20 1613)  . vancomycin      Assessment & Plan:   Active Problems:   SIRS (systemic inflammatory response syndrome) (HCC)   Shingles rash   Delirium   Acute metabolic encephalopathy   Essential hypertension   Anemia   Acute lower UTI   Chronic diastolic CHF (congestive heart failure) ([place])   Sepsis ([place])   Change in mental status    . SIRS/sepsis-was present on admission.  Patient met SIRS criteria by fever, heart rate more than 90s  Possible source  From urine. UCx pending Was started on broad spectrum: vanco and Rocephin ?meningitis but less likely as she does not have much neck sx. Possibly could have encephalitis , but pt did refuse LP. This am pt MS somewhat better, also confirmed with daughter at bedside. -ID consulted pending-will continue antibiotics until IDs input as she may need to be  deescalated. Calcitonin and lactic acid within normal limits  . Shingles-based on her severe chest and back pain described going and bandlike symptom possibly due to shingles.  Rash is also worrisome for shingles.  We will continue on IV acyclovir especially if concerned about possible encephalitis    . Delirium/. Acute metabolic encephalopathy -in the setting of shingles have to consider zoster induced encephalitis.possibly also from fever and possible UTI in this elderly  We will continue IV acyclovir and other antibiotics.  MRI of brain was obtained without any acute abnormality   . Essential hypertension -we will continue to allow permissive hypertension for now and if BP continues to will resume home meds    . Anemia -chronic, stable.  Likely from her gastric bypass.   . Acute lower UTI - Started on Rocephin, will continue Follow-up urine culture   . Chronic diastolic CHF (congestive heart failure)  Patient is without acute exacerbation.  Appears euvolemic on exam.  We will continue to hold Lasix for now and reevaluate daily Will DC IV fluids  Diabetes mellitus - Continue carb control  Check fingersticks and R-ISS     DVT prophylaxis: SCD Code Status: Full Family Communication: Daughter at bedside Status is: Inpatient  Remains inpatient appropriate because:IV treatments appropriate due to intensity of illness or inability to take PO   Dispo: The patient is from: Home              Anticipated d/c is to: Home              Anticipated d/c date is: 2 days              Patient  currently is not medically stable to d/c.  Patient requiring IV medications, still with some mental status confusion.  Work-up still pending.  ID consulted.            LOS: 0 days   Time spent: 45 min with >50% on coc    Nolberto Hanlon, MD [facility] Pager [phone]  If 7PM-7AM, please contact night-coverage [email or web address] Password [email or web address] 07/13/2020, 4:23 PM prog

## 2020-07-13 NOTE — Progress Notes (Signed)
Pharmacy Antibiotic Note  Casey Baldwin is a 79 y.o. female admitted on 07/12/2020 with possible meningitis (r/o).  Pharmacy has been consulted for Vancomycin dosing. Pt also on ceftriaxone and acyclovir. ID consult pending.   Pt received Vancomycin 1500 mg x 1 loading dose  Plan: Will continue dosing with vancomycin 1500 mg IV q24h to start at 2200 tonight based on PK calculations. Expected trough ~16.6 mcg/ml. [facility] nomogram dosing likely to result in trough >20 mcg/ml in this 79 year old pt.  Goal trough 15-20 mcg/ml.  Will need to continue to monitor renal function closely and adjust dose as needed. SCr ordered for AM.   Acyclovir 10 mg/kg IV q8h - will adjust to adjusted BW dosing for BMI of 30.    Height: 5\' 4"  (162.6 cm) Weight: 79.4 kg (175 lb) IBW/kg (Calculated) : 54.7  Temp (24hrs), Avg:99.6 F (37.6 C), Min:98.5 F (36.9 C), Max:101 F (38.3 C)  Recent Labs  Lab 07/08/20 2213 07/09/20 0139 07/10/20 0753 07/12/20 0919 07/12/20 2108 07/13/20 0710  WBC 8.0  --  7.2 6.3 5.5 4.9  CREATININE 0.94  --  1.02* 1.01*  --  0.86  LATICACIDVEN  --  0.5  --   --  0.9  --     Estimated Creatinine Clearance: 53.2 mL/min (by C-G formula based on SCr of 0.86 mg/dL).    Allergies  Allergen Reactions  . Lidocaine Anaphylaxis and Other (See Comments)    Became unconscious with administration for dental procedure at age 21yo. Pt tolerated subsequent lidocaine.    . Amlodipine Other (See Comments)    Hand and leg cramping  . Lactose Diarrhea  . Lactose Intolerance (Gi)   . Latex     Antimicrobials this admission: Cefepime/doxy x1 9/2 CTX 9/3 >> Acyclovir 9/3 >> Vanc 9/3 >>   Dose adjustments this admission:   Microbiology results: BCx x2 NGTD UCx collected MRSA PCR: negative  Thank you for allowing pharmacy to be a part of this patient's care.  11/3 07/13/2020 9:28 AM

## 2020-07-13 NOTE — Evaluation (Signed)
Occupational Therapy Evaluation Patient Details Name: Casey Baldwin MRN: [id number] DOB: Aug 01, 1941 Today's Date: 07/13/2020    History of Present Illness Casey Baldwin is a 79 y.o. 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 Prior to hospital admission, pt was MOD I for mobility using RW and required supervision for bathing. Pt lives in handicap accessible home c PCA 6hrs/day and daughter who works full time has been staying with her since most recent surgery. Pt presents to acute OT demonstrating impaired ADL performance and functional mobility 2/2 decreased activity tolerance and functional balance deficits. Upon arrival pt reports feeling nauseated and unable to attempt mobility this date - RN notified. Pt requires MIN A for LB access at bed level. Pt would benefit from skilled OT to address noted impairments and functional limitations (see below for any additional details) in order to maximize safety and independence while minimizing falls risk and caregiver burden. Upon hospital discharge, recommend HHOT to maximize pt safety and return to functional independence during meaningful occupations of daily life.     Follow Up Recommendations  Home health OT    Equipment Recommendations  None recommended by OT    Recommendations for Other Services       Precautions / Restrictions Precautions Precautions: Fall Restrictions Weight Bearing Restrictions: No      Mobility Bed Mobility    General bed mobility comments: Pt deferred stating nausea   Transfers      ADL either performed or assessed with clinical judgement   ADL Overall ADL's : Needs assistance/impaired      General ADL Comments: MIN A for LB access at bed level       Pertinent Vitals/Pain Pain Assessment: Faces Faces Pain Scale: Hurts a little bit Pain  Location: nauseated  Pain Descriptors / Indicators: Discomfort Pain Intervention(s): Limited activity within patient's tolerance;Other (comment) (RN notified nausea )     Hand Dominance Right   Extremity/Trunk Assessment Upper Extremity Assessment Upper Extremity Assessment: Overall WFL for tasks assessed   Lower Extremity Assessment Lower Extremity Assessment: Overall WFL for tasks assessed       Communication Communication Communication: HOH   Cognition Arousal/Alertness: Awake/alert Behavior During Therapy: WFL for tasks assessed/performed Overall Cognitive Status: Within Functional Limits for tasks assessed      General Comments       Exercises Exercises: Other exercises Other Exercises Other Exercises: Pt educated re: OT role, DME recs, d/c recs, importance of mobility for functional strengthening, adapted dressing tehcniques Other Exercises: LBD, self-drinking    Shoulder Instructions      Home Living Family/patient expects to be discharged to:: Private residence Living Arrangements: Children Available Help at Discharge: Family;Personal care attendant Type of Home: House Home Access: Level entry     Home Layout: One level     Bathroom Shower/Tub: Producer, television/film/video: Handicapped height Bathroom Accessibility: Yes   Home Equipment: Environmental consultant - 2 wheels;Bedside commode;Grab bars - toilet;Grab bars - tub/shower          Prior Functioning/Environment Level of Independence: Independent with assistive device(s)        Comments: PCA 6hrs/day supervises bathing / asssits c IADLs and a "maid" for cleaning. Enjoys hobbies of sewing/drawing         OT Problem List: Decreased strength;Decreased activity tolerance      OT Treatment/Interventions: Self-care/ADL  training;Therapeutic exercise;Energy conservation;DME and/or AE instruction;Therapeutic activities;Patient/family education;Balance training    OT Goals(Current goals can be found in the  care plan section) Acute Rehab OT Goals Patient Stated Goal: To return home  OT Goal Formulation: With patient Time For Goal Achievement: 07/27/20 Potential to Achieve Goals: Good ADL Goals Pt Will Perform Grooming: with modified independence;sitting Pt Will Perform Lower Body Dressing: with supervision;sit to/from stand (c LRAD PRN) Pt Will Transfer to Toilet: ambulating;regular height toilet;with modified independence (c LRAD PRN)  OT Frequency: Min 1X/week    AM-PAC OT "6 Clicks" Daily Activity     Outcome Measure Help from another person eating meals?: None Help from another person taking care of personal grooming?: A Little Help from another person toileting, which includes using toliet, bedpan, or urinal?: A Little Help from another person bathing (including washing, rinsing, drying)?: A Little Help from another person to put on and taking off regular upper body clothing?: A Little Help from another person to put on and taking off regular lower body clothing?: A Little 6 Click Score: 19   End of Session Nurse Communication: Mobility status  Activity Tolerance: Patient limited by fatigue Patient left: in bed;with call bell/phone within reach  OT Visit Diagnosis: Other abnormalities of gait and mobility (R26.89)                Time: 4010-2725 OT Time Calculation (min): 20 min Charges:  OT General Charges $OT Visit: 1 Visit OT Evaluation $OT Eval Moderate Complexity: 1 Mod OT Treatments $Self Care/Home Management : 8-22 mins  Kathie Dike, M.S. OTR/L  07/13/20, 1:16 PM  ascom [phone]

## 2020-07-13 NOTE — ED Notes (Signed)
Patient assisted to [facility]. Patient able to stand and transfer with little assistance. Patient returned to bed, covers placed for patient comfort. Lights out in room as per patient request. Will continue to monitor.

## 2020-07-13 NOTE — Evaluation (Signed)
Physical Therapy Evaluation Patient Details Name: Casey Baldwin MRN: [id number] DOB: 04/10/1941 Today's Date: 07/13/2020   History of Present Illness  Jeneal Vogl is a 79 y.o. 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  Presented to ER secondary to generalized body aches, progressive weakness, headaches; admitted for management of SIRS (urine?), shingles and acute metabolic encephalopathy.  Clinical Impression  Upon evaluation, patient alert and oriented to basic information; follows simple commands and agreeable to session.  Intermittent confusion evident, as patient with noted difficulty attending to task, following topic of conversation at times.  Generally weak and deconditioned throughout all extremities; slight moaning with functional activities.  Currently requiring cga/close sup for bed mobility; min assist for sit/stand, basic transfers and gait (20') with RW.  Demonstrates shuffling gait pattern, forward flexed posture; generally weak and unsteady.  Unsafe to attempt without RW and +1.  Visibly fatigued with minimal effort; unable to tolerate additional activity as result. Would benefit from skilled PT to address above deficits and promote optimal return to PLOF.; recommend transition to STR upon discharge from acute hospitalization.     Follow Up Recommendations SNF    Equipment Recommendations       Recommendations for Other Services       Precautions / Restrictions Precautions Precautions: Fall Restrictions Weight Bearing Restrictions: No      Mobility  Bed Mobility Overal bed mobility: Needs Assistance Bed Mobility: Supine to Sit;Sit to Supine     Supine to sit: Min guard Sit to supine: Supervision      Transfers Overall transfer level: Needs assistance Equipment used: Rolling walker (2 wheeled) Transfers: Sit to/from Stand Sit to Stand: Min assist         General  transfer comment: cuing for hand placement; slightly impulsive in the setting of bladder urgency  Ambulation/Gait Ambulation/Gait assistance: Min assist Gait Distance (Feet): 20 Feet Assistive device: Rolling walker (2 wheeled)       General Gait Details: shuffling gait pattern, forward flexed posture; generally weak and unsteady.  Unsafe to attempt without RW and +1.  Visibly fatigued with minimal effort; unable to tolerate additional activity as result.  Stairs            Wheelchair Mobility    Modified Rankin (Stroke Patients Only)       Balance Overall balance assessment: Needs assistance Sitting-balance support: No upper extremity supported;Feet supported Sitting balance-Leahy Scale: Good     Standing balance support: Bilateral upper extremity supported Standing balance-Leahy Scale: Fair                               Pertinent Vitals/Pain Pain Assessment: Faces Faces Pain Scale: Hurts little more Pain Location: nauseated, generally unwell Pain Descriptors / Indicators: Aching Pain Intervention(s): Limited activity within patient's tolerance;Monitored during session;Repositioned    Home Living Family/patient expects to be discharged to:: Private residence Living Arrangements: Alone (daughter has been staying with since GB surgery 10 weeks prior, does work full-time outside of th [employer]) Available Help at Discharge: Family;Personal care attendant Type of Home: House Home Access: Stairs to enter     Home Layout: One level Home Equipment: Environmental consultant - 2 wheels;Bedside commode;Grab bars - toilet;Grab bars - tub/shower      Prior Function Level of Independence: Independent with assistive device(s)         Comments: PCA 6hrs/day supervises bathing / asssits  c IADLs and a "maid" for cleaning. Enjoys hobbies of sewing/drawing      Hand Dominance   Dominant Hand: Right    Extremity/Trunk Assessment   Upper Extremity Assessment Upper Extremity  Assessment: Generalized weakness (grossly 4-/5 throughout)    Lower Extremity Assessment Lower Extremity Assessment: Generalized weakness (grossly 4-/5 throughout)       Communication      Cognition Arousal/Alertness: Awake/alert Behavior During Therapy: Flat affect                                   General Comments: oriented to basic information, follows simple commands; difficulty sustaining topic of conversation at times; limited insight, safety awareness and recall of information apparent      General Comments      Exercises Other Exercises Other Exercises: Toilet transfer, SPT to/from [facility], min assist; encouraged for use of RW.  Min/mod cuing for safety throughout task.  Standing balance at sink for hand hygiene, cga/min assist.  Does require unilateral UE support at all times to maintain balance.   Assessment/Plan    PT Assessment Patient needs continued PT services  PT Problem List Decreased strength;Decreased activity tolerance;Decreased balance;Decreased mobility;Decreased cognition;Decreased knowledge of use of DME;Decreased safety awareness;Decreased knowledge of precautions       PT Treatment Interventions DME instruction;Therapeutic activities;Cognitive remediation;Gait training;Therapeutic exercise;Stair training;Balance training;Functional mobility training;Patient/family education    PT Goals (Current goals can be found in the Care Plan section)  Acute Rehab PT Goals Patient Stated Goal: To return home  PT Goal Formulation: With patient Time For Goal Achievement: 07/27/20 Potential to Achieve Goals: Good    Frequency Min 2X/week   Barriers to discharge Decreased caregiver support      Co-evaluation               AM-PAC PT "6 Clicks" Mobility  Outcome Measure Help needed turning from your back to your side while in a flat bed without using bedrails?: None Help needed moving from lying on your back to sitting on the side of a flat  bed without using bedrails?: None Help needed moving to and from a bed to a chair (including a wheelchair)?: A Little Help needed standing up from a chair using your arms (e.g., wheelchair or bedside chair)?: A Little Help needed to walk in hospital room?: A Little Help needed climbing 3-5 steps with a railing? : A Little 6 Click Score: 20    End of Session Equipment Utilized During Treatment: Gait belt Activity Tolerance: Patient limited by fatigue Patient left: in bed;with call bell/phone within reach Nurse Communication: Mobility status PT Visit Diagnosis: Muscle weakness (generalized) (M62.81);Difficulty in walking, not elsewhere classified (R26.2)    Time: 4665-9935 PT Time Calculation (min) (ACUTE ONLY): 20 min   Charges:   PT Evaluation $PT Eval Moderate Complexity: 1 Mod PT Treatments $Therapeutic Activity: 8-22 mins        Johnny Latu H. Manson Passey, PT, DPT, NCS 07/13/20, 9:27 PM [phone]

## 2020-07-13 NOTE — Progress Notes (Signed)
OT Cancellation Note  Patient Details Name: Casey Baldwin MRN: [id number] DOB: 1941/03/04   Cancelled Treatment:    Reason Eval/Treat Not Completed: Patient declined, no reason specified. Upon arrival, pt receiving breakfast tray from NT, requesting hold OT until pt completed meal. Will follow and initiate services at later time as able.   Kathie Dike, M.S. OTR/L  07/13/20, 9:42 AM  ascom [phone]

## 2020-07-14 ENCOUNTER — Inpatient Hospital Stay: Payer: Medicare Other

## 2020-07-14 LAB — GLUCOSE, CAPILLARY
Glucose-Capillary: 92 mg/dL (ref 70–99)
Glucose-Capillary: 101 mg/dL — ABNORMAL HIGH (ref 70–99)
Glucose-Capillary: 98 mg/dL (ref 70–99)
Glucose-Capillary: 101 mg/dL — ABNORMAL HIGH (ref 70–99)
Glucose-Capillary: 99 mg/dL (ref 70–99)

## 2020-07-14 LAB — BASIC METABOLIC PANEL
Anion gap: 11 (ref 5–15)
BUN: 12 mg/dL (ref 8–23)
CO2: 21 mmol/L — ABNORMAL LOW (ref 22–32)
Calcium: 8.6 mg/dL — ABNORMAL LOW (ref 8.9–10.3)
Chloride: 99 mmol/L (ref 98–111)
Creatinine, Ser: 0.85 mg/dL (ref 0.44–1.00)
GFR calc Af Amer: >60 mL/min (ref >60)
GFR calc non Af Amer: >60 mL/min (ref >60)
Glucose, Bld: 100 mg/dL — ABNORMAL HIGH (ref 70–99)
Potassium: 3.7 mmol/L (ref 3.5–5.1)
Sodium: 131 mmol/L — ABNORMAL LOW (ref 135–145)

## 2020-07-14 LAB — CULTURE, BLOOD (ROUTINE X 2): Culture: NO GROWTH

## 2020-07-14 MED ORDER — IOHEXOL 300 MG/ML  SOLN
100.0000 mL | Freq: Once | INTRAMUSCULAR | Status: AC | PRN
  Administered 2020-07-14: 100 mL via INTRAVENOUS

## 2020-07-14 MED ORDER — IOHEXOL 9 MG/ML PO SOLN
500.0000 mL | ORAL | Status: AC
  Administered 2020-07-14: 500 mL via ORAL

## 2020-07-14 NOTE — Progress Notes (Signed)
PROGRESS NOTE    Casey Baldwin  AYT:[id number] DOB: 1941-08-03 DOA: 07/12/2020 PCP: Casey Needy, MD    Brief Narrative:  Casey Baldwin is a 79 y.o. 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 Her temperature at home was up to 102.2 Patient was very weak and had hard time getting up Family recently noted a rash that was going around her right side of back and right breast.  Rash is intensely pruritic different stages of healing some areas of vesicular be red border.  Some areas covered in black eschars.  Skin is diffusely tender.Per family (asked by me) pt developed confusion. Was c/o side to back pain.  Sed rate was elevated.  LP was considered but patient declined stating that she had a history of meningitis in the past and very scared of having LP done.  Hospitalist was called for admission for shingles and possible encephalitis.    Consultants:   ID  Procedures:   Antimicrobials:   Acyclovir IV started on 9/3  Cefepime IV started on 9/2 x1   Ceftriaxone 2 g started on 9/3  Vancomycin started on 9/3   Subjective: Patient becomes upset when I asked her the questions to see if she is alert and oriented as she tells me she knows where she is and what year "I do not understand why you are asking these questions".  Still with pain.  No other complaints.  Objective: Vitals:   07/13/20 2308 07/14/20 0125 07/14/20 0353 07/14/20 0758  BP: (!) 150/72 (!) 155/93 (!) 157/73 (!) 147/81  Pulse: 69 68 74 76  Resp: '16 19 16 17  ' Temp: 98.7 F (37.1 C) 98.7 F (37.1 C) 99.9 F (37.7 C) 98.3 F (36.8 C)  TempSrc: Oral Oral Oral Oral  SpO2: 94% 94% 96% 93%  Weight:      Height:        Intake/Output Summary (Last 24 hours) at 07/14/2020 0802 Last data filed at 07/14/2020 0347 Gross per 24 hour  Intake 120  ml  Output 850 ml  Net -730 ml   Filed Weights   07/12/20 0918 07/12/20 0921  Weight: 79.4 kg 79.4 kg    Examination:  General exam: NAD, comfortable Respiratory system: CTA, no wheeze rales rhonchi's  Cardiovascular system: RRR S1-S2 no murmurs  gastrointestinal system: Soft nontender nondistended positive bowel sounds Central nervous system: Grossly intact, alert oriented x3  extremities: No edema Skin: Warm dry Psychiatry: Mood and affect appropriate in current setting on 1 forearm case manager Vaughan Basta talk to him    Data Reviewed: I have personally reviewed following labs and imaging studies  CBC: Recent Labs  Lab 07/08/20 2213 07/10/20 0753 07/12/20 0919 07/12/20 2108 07/13/20 0710  WBC 8.0 7.2 6.3 5.5 4.9  NEUTROABS  --  5.5  --  3.8 3.0  HGB 9.8* 10.2* 10.9* 9.9* 9.2*  HCT 30.4* 31.7* 32.3* 30.2* 28.3*  MCV 99.3 100.0 95.8 98.4 99.3  PLT 261 258 269 246 355   Basic Metabolic Panel: Recent Labs  Lab 07/08/20 2213 07/10/20 0753 07/12/20 0919 07/12/20 2108 07/13/20 0710  NA 133* 133* 136  --  134*  K 3.7 3.6 3.8  --  3.3*  CL 102 100 99  --  102  CO2 '23 25 24  ' --  23  GLUCOSE 128* 171* 128*  --  108*  BUN '13 12 16  ' --  15  CREATININE 0.94 1.02* 1.01*  --  0.86  CALCIUM 8.4* 8.7* 8.8*  --  8.3*  MG  --   --   --  1.8 1.8  PHOS  --   --   --   --  3.8   GFR: Estimated Creatinine Clearance: 53.2 mL/min (by C-G formula based on SCr of 0.86 mg/dL). Liver Function Tests: Recent Labs  Lab 07/10/20 0753 07/12/20 2108 07/13/20 0710  AST 16 14* 14*  ALT '15 12 11  ' ALKPHOS 61 62 55  BILITOT 1.1 1.1 0.9  PROT 7.1 7.1 6.3*  ALBUMIN 3.8 3.6 3.3*   Recent Labs  Lab 07/12/20 2108  LIPASE 27   No results for input(s): AMMONIA in the last 168 hours. Coagulation Profile: No results for input(s): INR, PROTIME in the last 168 hours. Cardiac Enzymes: Recent Labs  Lab 07/10/20 0753 07/12/20 2108  CKTOTAL 90 46   BNP (last 3 results) No results for  input(s): PROBNP in the last 8760 hours. HbA1C: Recent Labs    07/13/20 0710  HGBA1C 5.8*   CBG: Recent Labs  Lab 07/13/20 0804 07/13/20 1159 07/13/20 1622 07/13/20 2121 07/14/20 0347  GLUCAP 97 119* 101* 93 92   Lipid Profile: No results for input(s): CHOL, HDL, LDLCALC, TRIG, CHOLHDL, LDLDIRECT in the last 72 hours. Thyroid Function Tests: Recent Labs    07/13/20 0710  TSH 5.998*   Anemia Panel: Recent Labs    07/13/20 0710  VITAMINB12 1,048*  FOLATE 35.0  FERRITIN 46  TIBC 269  IRON 22*  RETICCTPCT 1.5   Sepsis Labs: Recent Labs  Lab 07/09/20 0139 07/12/20 2108  PROCALCITON  --  <0.10  LATICACIDVEN 0.5 0.9    Recent Results (from the past 240 hour(s))  Culture, blood (routine x 2)     Status: None   Collection Time: 07/09/20  1:39 AM   Specimen: BLOOD  Result Value Ref Range Status   Specimen Description BLOOD RIGHT AC  Final   Special Requests   Final    BOTTLES DRAWN AEROBIC AND ANAEROBIC Blood Culture results may not be optimal due to an excessive volume of blood received in culture bottles   Culture   Final    NO GROWTH 5 DAYS Performed at [facility], [address], [place], [place] [address]    Report Status 07/14/2020 FINAL  Final  SARS Coronavirus 2 by RT PCR (hospital order, performed in [place] hospital lab) Nasopharyngeal Nasopharyngeal Swab     Status: None   Collection Time: 07/09/20  2:49 AM   Specimen: Nasopharyngeal Swab  Result Value Ref Range Status   SARS Coronavirus 2 NEGATIVE NEGATIVE Final    Comment: (NOTE) SARS-CoV-2 target nucleic acids are NOT DETECTED.  The SARS-CoV-2 RNA is generally detectable in upper and lower respiratory specimens during the acute phase of infection. The lowest concentration of SARS-CoV-2 viral copies this assay can detect is 250 copies / mL. A negative result does not preclude SARS-CoV-2 infection and should not be used as the sole basis for treatment or other patient management  decisions.  A negative result may occur with improper specimen collection / handling, submission of specimen other than nasopharyngeal swab, presence of viral mutation(s) within the areas targeted by this assay, and inadequate number of viral copies (<250 copies / mL). A negative result must be combined with clinical observations, patient history, and epidemiological information.  Fact Sheet for Patients:  StrictlyIdeas.no  Fact Sheet for Healthcare Providers: BankingDealers.co.za  This test is not yet approved or  cleared by the [place] FDA and has been authorized for detection and/or diagnosis of SARS-CoV-2 by FDA under an Emergency Use Authorization (EUA).  This EUA will remain in effect (meaning this test can be used) for the duration of the COVID-19 declaration under Section 564(b)(1) of the Act, 21 U.S.C. section 360bbb-3(b)(1), unless the authorization is terminated or revoked sooner.  Performed at [facility], [address] [place], [place] [address]   SARS Coronavirus 2 by RT PCR (hospital order, performed in [facility] hospital lab) Nasopharyngeal Nasopharyngeal Swab     Status: None   Collection Time: 07/10/20  2:48 PM   Specimen: Nasopharyngeal Swab  Result Value Ref Range Status   SARS Coronavirus 2 NEGATIVE NEGATIVE Final    Comment: (NOTE) SARS-CoV-2 target nucleic acids are NOT DETECTED.  The SARS-CoV-2 RNA is generally detectable in upper and lower respiratory specimens during the acute phase of infection. The lowest concentration of SARS-CoV-2 viral copies this assay can detect is 250 copies / mL. A negative result does not preclude SARS-CoV-2 infection and should not be used as the sole basis for treatment or other patient management decisions.  A negative result may occur with improper specimen collection / handling, submission of specimen other than nasopharyngeal swab, presence of viral  mutation(s) within the areas targeted by this assay, and inadequate number of viral copies (<250 copies / mL). A negative result must be combined with clinical observations, patient history, and epidemiological information.  Fact Sheet for Patients:   StrictlyIdeas.no  Fact Sheet for Healthcare Providers: BankingDealers.co.za  This test is not yet approved or  cleared by the [place] FDA and has been authorized for detection and/or diagnosis of SARS-CoV-2 by FDA under an Emergency Use Authorization (EUA).  This EUA will remain in effect (meaning this test can be used) for the duration of the COVID-19 declaration under Section 564(b)(1) of the Act, 21 U.S.C. section 360bbb-3(b)(1), unless the authorization is terminated or revoked sooner.  Performed at [facility], [address], [place], [place] [address]   Urine culture     Status: Abnormal (Preliminary result)   Collection Time: 07/12/20  9:23 PM   Specimen: Urine, Random  Result Value Ref Range Status   Specimen Description   Final    URINE, RANDOM Performed at [facility], [address] [place], [place] [address]    Special Requests   Final    NONE Performed at [facility], [address] [place], [place] [address]    Culture MULTIPLE SPECIES PRESENT, SUGGEST RECOLLECTION (A)  Final   Report Status PENDING  Incomplete  Culture, blood (routine x 2)     Status: None (Preliminary result)   Collection Time: 07/12/20  9:23 PM   Specimen: BLOOD  Result Value Ref Range Status   Specimen Description BLOOD RIGHT AC  Final   Special Requests   Final    BOTTLES DRAWN AEROBIC AND ANAEROBIC Blood Culture results may not be optimal due to an excessive volume of blood received in culture bottles   Culture   Final    NO GROWTH 2 DAYS Performed at [facility], [address] [place],  [address]    Report Status PENDING   Incomplete  Culture, blood (routine x 2)     Status: None (Preliminary result)   Collection Time: 07/12/20  9:23 PM   Specimen:  BLOOD  Result Value Ref Range Status   Specimen Description BLOOD RIGHT WRIST  Final   Special Requests   Final    BOTTLES DRAWN AEROBIC AND ANAEROBIC Blood Culture adequate volume   Culture   Final    NO GROWTH 2 DAYS Performed at [facility], [address], [place], [place] [address]    Report Status PENDING  Incomplete  SARS Coronavirus 2 by RT PCR (hospital order, performed in [facility] hospital lab) Nasopharyngeal Nasopharyngeal Swab     Status: None   Collection Time: 07/12/20 11:13 PM   Specimen: Nasopharyngeal Swab  Result Value Ref Range Status   SARS Coronavirus 2 NEGATIVE NEGATIVE Final    Comment: (NOTE) SARS-CoV-2 target nucleic acids are NOT DETECTED.  The SARS-CoV-2 RNA is generally detectable in upper and lower respiratory specimens during the acute phase of infection. The lowest concentration of SARS-CoV-2 viral copies this assay can detect is 250 copies / mL. A negative result does not preclude SARS-CoV-2 infection and should not be used as the sole basis for treatment or other patient management decisions.  A negative result may occur with improper specimen collection / handling, submission of specimen other than nasopharyngeal swab, presence of viral mutation(s) within the areas targeted by this assay, and inadequate number of viral copies (<250 copies / mL). A negative result must be combined with clinical observations, patient history, and epidemiological information.  Fact Sheet for Patients:   StrictlyIdeas.no  Fact Sheet for Healthcare Providers: BankingDealers.co.za  This test is not yet approved or  cleared by the [place] FDA and has been authorized for detection and/or diagnosis of SARS-CoV-2 by FDA under an Emergency Use Authorization (EUA).  This EUA will  remain in effect (meaning this test can be used) for the duration of the COVID-19 declaration under Section 564(b)(1) of the Act, 21 U.S.C. section 360bbb-3(b)(1), unless the authorization is terminated or revoked sooner.  Performed at [facility], [address] [place], [place] [address]   MRSA PCR Screening     Status: None   Collection Time: 07/13/20  2:02 AM   Specimen: Nasal Mucosa; Nasopharyngeal  Result Value Ref Range Status   MRSA by PCR NEGATIVE NEGATIVE Final    Comment:        The GeneXpert MRSA Assay (FDA approved for NASAL specimens only), is one component of a comprehensive MRSA colonization surveillance program. It is not intended to diagnose MRSA infection nor to guide or monitor treatment for MRSA infections. Performed at [facility], [address] [place], [place] [address]          Radiology Studies: DG Chest 2 View  Result Date: 07/12/2020 CLINICAL DATA:  Chest pain for 3 days EXAM: CHEST - 2 VIEW COMPARISON:  04/01/2020 FINDINGS: Normal heart size and mediastinal contours. No acute infiltrate or edema. No effusion or pneumothorax. No acute osseous findings. Postoperative epigastric region. IMPRESSION: No evidence of active disease. Electronically Signed   By: Monte Fantasia M.D.   On: 07/12/2020 10:26   MR BRAIN W WO CONTRAST  Result Date: 07/13/2020 CLINICAL DATA:  Encephalopathy.  Delirium. EXAM: MRI HEAD WITHOUT AND WITH CONTRAST TECHNIQUE: Multiplanar, multiecho pulse sequences of the brain and surrounding structures were obtained without and with intravenous contrast. CONTRAST:  20m GADAVIST GADOBUTROL 1 MMOL/ML IV SOLN COMPARISON:  None. FINDINGS: Brain: No acute infarct, acute hemorrhage or extra-axial collection. Normal white matter signal. There is generalized atrophy without lobar predilection. No chronic microhemorrhage. Normal midline  structures. Vascular: Normal flow voids. Skull and upper cervical spine: Normal marrow  signal. Sinuses/Orbits: Bilateral ocular lens replacements. Paranasal sinuses and mastoid air cells are clear. Other: None IMPRESSION: 1. No acute intracranial abnormality. 2. Generalized atrophy without lobar predilection. Electronically Signed   By: Ulyses Jarred M.D.   On: 07/13/2020 02:05   US Abdomen Limited RUQ  Result Date: 07/12/2020 CLINICAL DATA:  Abnormal CT with right upper quadrant edema. EXAM: ULTRASOUND ABDOMEN LIMITED RIGHT UPPER QUADRANT COMPARISON:  04/01/2020, 07/10/2020 FINDINGS: Gallbladder: Surgically absent Common bile duct: Diameter: 7 mm Liver: Echogenicity is within normal limits. There is no focal parenchymal abnormality. Stable mild intrahepatic biliary duct dilation. Portal vein is patent on color Doppler imaging with normal direction of blood flow towards the liver. Other: No free fluid. IMPRESSION: 1. Cholecystectomy. 2. Mild intrahepatic biliary duct dilation unchanged since CT. Normal caliber of the common bile duct in a patient status post cholecystectomy. 3. Otherwise unremarkable exam. Evaluation of the right upper quadrant mesenteric edema seen on CT is limited by ultrasound. Electronically Signed   By: Randa Ngo M.D.   On: 07/12/2020 21:46        Scheduled Meds: . docusate sodium  100 mg Oral BID  . insulin aspart  0-9 Units Subcutaneous Q4H  . pantoprazole  20 mg Oral Daily  . rosuvastatin  20 mg Oral Daily  . sodium chloride flush  3 mL Intravenous Q12H   Continuous Infusions: . acyclovir 645 mg (07/14/20 0355)  . cefTRIAXone (ROCEPHIN)  IV    . vancomycin Stopped (07/14/20 0146)    Assessment & Plan:   Active Problems:   SIRS (systemic inflammatory response syndrome) (HCC)   Shingles rash   Delirium   Acute metabolic encephalopathy   Essential hypertension   Anemia   Acute lower UTI   Chronic diastolic CHF (congestive heart failure) ([place])   Sepsis ([place])   Change in mental status    . SIRS/sepsis-was present on admission.  Patient  met SIRS criteria by fever, heart rate more than 90s . 9/4: Urine culture with mixed species . possible source  From urine. UCx pending Was started on Rocephin and vancomycin for concern of meningitis? IDs input was appreciated Calcitonin and lactic acid within normal limits 9/4 per ID with patient having mental status change and also fever there is a concern for zoster meningitis MRI normal Doubt bacterial meningitis clinically. Patient refused LP ID recommends continuing acyclovir, decrease ceftriaxone to once a day from twice a day and if blood cultures negative discontinue vancomycin  . Shingles-based on her severe chest and back pain, painful rash rash described going and bandlike symptom and dermatomal fashion likely shingles present on admission Still with quit bit of pain, continue pain management. Trying to avoid iv pain meds to prevent confusion. Continue IV acyclovir  Right Abdominal firmness- etiology unclear.  Will obtain CT of abdomen and pelvis for further evaluation   . Delirium/. Acute metabolic encephalopathy -in the setting of shingles have to consider zoster meningitis  Urine culture was mixed bacteria We will continue IV acyclovir along with ceftriaxone MRI of brain was basically unremarkable   . Essential hypertension -mildly elevated. Will start her on amlodipine 5 mg daily  . Anemia -chronic, stable.  Likely from gastric bypass H&H currently stable   . Acute lower UTI - Urine culture was with mixed flora On ceftriaxone will continue for now as above noted    . Chronic diastolic CHF (congestive heart failure)  Patient is  without acute exacerbation.  Appears euvolemic on exam.  We will continue to hold Lasix for now and reevaluate daily Will DC IV fluids  Diabetes mellitus - Continue carb control  Check fingersticks and R-ISS     DVT prophylaxis: SCD Code Status: Full Family Communication: Daughter at bedside Status is:  Inpatient  Remains inpatient appropriate because:IV treatments appropriate due to intensity of illness or inability to take PO   Dispo: The patient is from: Home              Anticipated d/c is to: Home              Anticipated d/c date is: 2 days              Patient currently is not medically stable to d/c.  Patient requiring IV medications, concern for meningitis. Also with lots of pain that currently needs controlling.           LOS: 1 day   Time spent: 45 min with >50% on coc    Nolberto Hanlon, MD [facility] Pager [phone]  If 7PM-7AM, please contact night-coverage [email or web address] Password [email or web address] 07/14/2020, 8:02 AM prog

## 2020-07-14 NOTE — Progress Notes (Signed)
Patient arrived on unit.  Patient oriented to floor.

## 2020-07-15 LAB — GLUCOSE, CAPILLARY
Glucose-Capillary: 95 mg/dL (ref 70–99)
Glucose-Capillary: 106 mg/dL — ABNORMAL HIGH (ref 70–99)
Glucose-Capillary: 115 mg/dL — ABNORMAL HIGH (ref 70–99)
Glucose-Capillary: 100 mg/dL — ABNORMAL HIGH (ref 70–99)
Glucose-Capillary: 159 mg/dL — ABNORMAL HIGH (ref 70–99)

## 2020-07-15 LAB — BASIC METABOLIC PANEL
Anion gap: 12 (ref 5–15)
BUN: 15 mg/dL (ref 8–23)
CO2: 23 mmol/L (ref 22–32)
Calcium: 8.8 mg/dL — ABNORMAL LOW (ref 8.9–10.3)
Chloride: 97 mmol/L — ABNORMAL LOW (ref 98–111)
Creatinine, Ser: 0.91 mg/dL (ref 0.44–1.00)
GFR calc Af Amer: >60 mL/min (ref >60)
GFR calc non Af Amer: 60 mL/min — ABNORMAL LOW (ref >60)
Glucose, Bld: 112 mg/dL — ABNORMAL HIGH (ref 70–99)
Potassium: 3.8 mmol/L (ref 3.5–5.1)
Sodium: 132 mmol/L — ABNORMAL LOW (ref 135–145)

## 2020-07-15 LAB — URINE CULTURE

## 2020-07-15 MED ORDER — ADULT MULTIVITAMIN W/MINERALS CH
1.0000 | ORAL_TABLET | Freq: Every day | ORAL | Status: DC
  Administered 2020-07-16 – 2020-07-19 (×4): 1 via ORAL
  Filled 2020-07-15 (×6): qty 1

## 2020-07-15 MED ORDER — GABAPENTIN 100 MG PO CAPS
100.0000 mg | ORAL_CAPSULE | Freq: Two times a day (BID) | ORAL | Status: DC
  Administered 2020-07-15 – 2020-07-20 (×10): 100 mg via ORAL
  Filled 2020-07-15 (×10): qty 1

## 2020-07-15 MED ORDER — ENSURE ENLIVE PO LIQD
237.0000 mL | Freq: Two times a day (BID) | ORAL | Status: DC
  Administered 2020-07-15 – 2020-07-18 (×5): 237 mL via ORAL

## 2020-07-15 NOTE — TOC Progression Note (Signed)
Transition of Care [facility]) - Progression Note    Patient Details  Name: Casey Baldwin MRN: [id number] Date of Birth: Apr 21, 1941  Transition of [facility]) CM/SW Contact  Maud Deed, [place] Phone Number: 07/15/2020, 11:24 AM  Clinical Narrative:    CSW contacted pt's daughter Darl Pikes to go over PT recommendations and she was not agreeable to SNF and would like to speak with the physical therapist for more information on why SNF was recommended. CSW notified PT of pt's daughters request.        Expected Discharge Plan and Services                                                 Social Determinants of Health (SDOH) Interventions    Readmission Risk Interventions No flowsheet data found.

## 2020-07-15 NOTE — Evaluation (Signed)
Physical Therapy Evaluation Patient Details Name: Casey Baldwin MRN: [id number] DOB: [birth year] Today's Date: 07/15/2020   History of Present Illness  Casey Baldwin is a 79 y.o. 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  Presented to ER secondary to generalized body aches, progressive weakness, headaches; admitted for management of SIRS (urine?), shingles and acute metabolic encephalopathy.  Clinical Impression  Patient remains generally confused, but much more alert and participatory with session this date. Demonstrates ability complete bed mobility with mod indep; sit/stand, basic transfers an gait (50') with RW, cga/close sup. Min cuing for walker position, overall safety awareness.  No reports of pain this date; improved activity tolerance compared to initial evaluation.  Given noted improvement, discharge recommendations updated to reflect recommendations for home with HHPT, 24/7 supervision at discharge.  CSW informed/aware.  Phone call to daughter (per CSW request), confirmed patient password--reviewed initial evaluation and recommendations, current session and updated recommendations this date. Discussed current functional status and level of supervision/assist recommended in home environment; discussed persistent AMS noted during session (per daughter, patient normally "sharp as a tack").  Daughter appreciative of call and in agreement with recommendation.    Follow Up Recommendations Home health PT    Equipment Recommendations       Recommendations for Other Services       Precautions / Restrictions Precautions Precautions: Fall Restrictions Weight Bearing Restrictions: No      Mobility  Bed Mobility Overal bed mobility: Modified Independent Bed Mobility: Sit to Supine;Rolling Rolling: Modified independent (Device/Increase time)   Supine to sit: Modified independent (Device/Increase  time) Sit to supine: Supervision   General bed mobility comments: flat bed and rail use  Transfers Overall transfer level: Needs assistance Equipment used: Rolling walker (2 wheeled) Transfers: Sit to/from Stand Sit to Stand: Supervision Stand pivot transfers: Min guard       General transfer comment: tends to pull on RW, impulsively attempts to stand prior to cuing at times  Ambulation/Gait Ambulation/Gait assistance: Supervision;Min guard Gait Distance (Feet): 50 Feet Assistive device: Rolling walker (2 wheeled)       General Gait Details: reciprocal stepping pattern with improved step height/length and overall gait quality from initial assessment; continue to recommend use of RW  Stairs            Wheelchair Mobility    Modified Rankin (Stroke Patients Only)       Balance Overall balance assessment: Needs assistance Sitting-balance support: No upper extremity supported;Feet supported Sitting balance-Leahy Scale: Good     Standing balance support: Single extremity supported Standing balance-Leahy Scale: Good                               Pertinent Vitals/Pain Pain Assessment: No/denies pain Faces Pain Scale: Hurts a little bit Pain Location: abdominal pain Pain Descriptors / Indicators: Grimacing;Discomfort Pain Intervention(s): Limited activity within patient's tolerance;Repositioned    Home Living                        Prior Function                 Hand Dominance        Extremity/Trunk Assessment                Communication      Cognition Arousal/Alertness: Awake/alert Behavior During Therapy: [facility] for tasks assessed/performed  Overall Cognitive Status: Impaired/Different from baseline                                 General Comments: Oriented to self, location and general reason for admission; follows approx 75% simple verbal commands.  Remains generally confused to more complex  information, easily distractible by external environment, difficulty maintaining topic of conversation at times.  Per daughter, normally cognitively intact and "sharp as a tack" at baseline.      General Comments      Exercises Other Exercises Other Exercises: Self-feeding, bed mobility, SPT, sit>sup, sitting/standing balance/tolerance   Assessment/Plan    PT Assessment Patient needs continued PT services  PT Problem List Decreased strength;Decreased activity tolerance;Decreased balance;Decreased mobility;Decreased cognition;Decreased knowledge of use of DME;Decreased safety awareness;Decreased knowledge of precautions       PT Treatment Interventions      PT Goals (Current goals can be found in the Care Plan section)  Acute Rehab PT Goals Patient Stated Goal: To return home  PT Goal Formulation: With patient Time For Goal Achievement: 07/27/20 Potential to Achieve Goals: Good    Frequency Min 2X/week   Barriers to discharge        Co-evaluation               AM-PAC PT "6 Clicks" Mobility  Outcome Measure Help needed turning from your back to your side while in a flat bed without using bedrails?: None Help needed moving from lying on your back to sitting on the side of a flat bed without using bedrails?: None Help needed moving to and from a bed to a chair (including a wheelchair)?: None Help needed standing up from a chair using your arms (e.g., wheelchair or bedside chair)?: None Help needed to walk in hospital room?: A Little Help needed climbing 3-5 steps with a railing? : A Little 6 Click Score: 22    End of Session Equipment Utilized During Treatment: Gait belt Activity Tolerance: Patient tolerated treatment well Patient left: in chair;with call bell/phone within reach;with chair alarm set Nurse Communication: Mobility status PT Visit Diagnosis: Muscle weakness (generalized) (M62.81);Difficulty in walking, not elsewhere classified (R26.2)    Time:  9983-3825 PT Time Calculation (min) (ACUTE ONLY): 27 min   Charges:     PT Treatments $Gait Training: 8-22 mins $Therapeutic Activity: 8-22 mins       Jahnai Slingerland H. Manson Passey, PT, DPT, NCS 07/15/20, 4:56 PM [phone]

## 2020-07-15 NOTE — Progress Notes (Addendum)
Initial Nutrition Assessment  DOCUMENTATION CODES:   Not applicable  INTERVENTION:   Ensure Enlive po BID, each supplement provides 350 kcal and 20 grams of protein  MVI daily   Liberalize diet   Pt at high refeed risk; recommend monitor K, Mg and P labs as oral intake improves  Pt at high risk for nutrient deficiencies r/t her gastric bypass surgery. Recommend check thiamine, zinc, copper and vitamins D, A, E, & K labs  NUTRITION DIAGNOSIS:   Inadequate oral intake related to acute illness as evidenced by other (comment) (per chart review).  GOAL:   Patient will meet greater than or equal to 90% of their needs  MONITOR:   PO intake, Supplement acceptance, Labs, Weight trends, Skin, I & O's  REASON FOR ASSESSMENT:   Consult Assessment of nutrition requirement/status  ASSESSMENT:   79 y/o female with h/o CHF, HLD, HTN, OSA, DM, Roux-en-y 2010, Lap band 2008, cholecystitis s/p (diagnostic laparoscopy, robotic lysis of adhesions, transgastric ERCP and biliary sphincterotomy, cholecystectomy, partial gastrectomy and colotomy 5/27) and colon resection for diverticulitis who is admitted with UTI, SIRS, shingles and encephalitis  RD working remotely.  Unable to speak with pt r/t AMS. Per chart review, pt with 25lb(12%) weight loss since January; RD unsure how recently weight loss occurred. RD suspects pt with poor appetite and oral intake pta. Pt with h/o gastric bypass; would recommend checking vitamins labs. RD will add supplements and MVI to help pt meet her estimated needs. RD will also liberalize pt's diet as a heart healthy diet is restrictive of protein. RD will obtain nutrition related history and exam at follow-up. Pt is at high risk for malnutrition.   Medications reviewed and include: colace, insulin, protonix, ceftriaxone   Labs reviewed: Na 131(L) Iron 22(L), TIBC 269, ferritin, 46, folate 35, B12 1048- 9/3 Hgb 9.2(L), Hct 28.3(L) cbgs- 95, 106 x 24 hrs AIC  5.8(H)- 9/3  NUTRITION - FOCUSED PHYSICAL EXAM: Unable to perform at this time   Diet Order:   Diet Order            Diet Heart Room service appropriate? Yes; Fluid consistency: Thin  Diet effective now                EDUCATION NEEDS:   Not appropriate for education at this time  Skin:  Skin Assessment: Reviewed RN Assessment (ecchymosis)  Last BM:  pta  Height:   Ht Readings from Last 1 Encounters:  07/12/20 5\' 4"  (1.626 m)    Weight:   Wt Readings from Last 1 Encounters:  07/12/20 79.4 kg    Ideal Body Weight:  54.5 kg  BMI:  Body mass index is 30.04 kg/m.  Estimated Nutritional Needs:   Kcal:  1700-1900kcal/day  Protein:  85-95g/day  Fluid:  1.6L/day  09/11/20 MS, RD, LDN Please refer to [facility] for RD and/or RD on-call/weekend/after hours pager

## 2020-07-15 NOTE — Progress Notes (Signed)
Pt has been alert and oriented, VS stable. Still on abx, changed diet to regular diet, tolerated well. PT worked with the pt and was able to stay in the chair for hours.

## 2020-07-15 NOTE — Progress Notes (Addendum)
PROGRESS NOTE    Casey Baldwin  NWG:[id number] DOB: 03-14-1941 DOA: 07/12/2020 PCP: Annice Needy, MD    Brief Narrative:  Casey Baldwin is a 79 y.o. 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 Her temperature at home was up to 102.2 Patient was very weak and had hard time getting up Family recently noted a rash that was going around her right side of back and right breast.  Rash is intensely pruritic different stages of healing some areas of vesicular be red border.  Some areas covered in black eschars.  Skin is diffusely tender.Per family (asked by me) pt developed confusion. Was c/o side to back pain.  Sed rate was elevated.  LP was considered but patient declined stating that she had a history of meningitis in the past and very scared of having LP done.  Hospitalist was called for admission for shingles and possible encephalitis.    Consultants:   ID  Procedures:   Antimicrobials:   Acyclovir IV started on 9/3  Cefepime IV started on 9/2 x1   Ceftriaxone 2 g started on 9/3  Vancomycin started on 9/3   Subjective: Daughter at bedside.  Patient usually complains of morning headaches.  She does have history of sleep apnea and is not using her CPAP here.  She does use her CPAP at home.  Otherwise she is complaining that this with the same bandlike abdominal pain. Decrease po intake.   Objective: Vitals:   07/14/20 1146 07/14/20 1551 07/14/20 2319 07/15/20 0747  BP: (!) 143/75 (!) 153/72 134/71 (!) 148/80  Pulse: 78 86 88 94  Resp:  _0 Temp: 99.6 F (37.6 C) 98.6 F (37 C) 98.7 F (37.1 C) 99.7 F (37.6 C)  TempSrc: Oral Oral Oral Oral  SpO2: 96% 94% 96% 93%  Weight:      Height:        Intake/Output Summary (Last 24 hours) at 07/15/2020 1337 Last data filed at 07/15/2020 0400 Gross  per 24 hour  Intake 574.59 ml  Output --  Net 574.59 ml   Filed Weights   07/12/20 0918 07/12/20 0921  Weight: 79.4 kg 79.4 kg    Examination: Comfortable laying in bed, daughter at bedside Alert oriented x3 grossly intact neuro exam Clear to auscultation no wheeze rales rhonchi's Regular S1-S2 no murmurs rubs gallops Abdomen soft nontender nondistended positive bowel sounds No bilateral lower extremity edema bilaterally Mood and affect appropriate in current setting    Data Reviewed: I have personally reviewed following labs and imaging studies  CBC: Recent Labs  Lab 07/08/20 2213 07/10/20 0753 07/12/20 0919 07/12/20 2108 07/13/20 0710  WBC 8.0 7.2 6.3 5.5 4.9  NEUTROABS  --  5.5  --  3.8 3.0  HGB 9.8* 10.2* 10.9* 9.9* 9.2*  HCT 30.4* 31.7* 32.3* 30.2* 28.3*  MCV 99.3 100.0 95.8 98.4 99.3  PLT 261 258 269 246 578   Basic Metabolic Panel: Recent Labs  Lab 07/10/20 0753 07/12/20 0919 07/12/20 2108 07/13/20 0710 07/14/20 0830 07/15/20 0935  NA 133* 136  --  134* 131* 132*  K 3.6 3.8  --  3.3* 3.7 3.8  CL 100 99  --  102 99 97*  CO2 25 24  --  23 21* 23  GLUCOSE 171* 128*  --  108* 100* 112*  BUN 12 16  --  _0 CREATININE 1.02* 1.01*  --  0.86 0.85 0.91  CALCIUM 8.7* 8.8*  --  8.3* 8.6* 8.8*  MG  --   --  1.8 1.8  --   --   PHOS  --   --   --  3.8  --   --    GFR: Estimated Creatinine Clearance: 50.3 mL/min (by C-G formula based on SCr of 0.91 mg/dL). Liver Function Tests: Recent Labs  Lab 07/10/20 0753 07/12/20 2108 07/13/20 0710  AST 16 14* 14*  ALT _1 ALKPHOS 61 62 55  BILITOT 1.1 1.1 0.9  PROT 7.1 7.1 6.3*  ALBUMIN 3.8 3.6 3.3*   Recent Labs  Lab 07/12/20 2108  LIPASE 27   No results for input(s): AMMONIA in the last 168 hours. Coagulation Profile: No results for input(s): INR, PROTIME in the last 168 hours. Cardiac Enzymes: Recent Labs  Lab 07/10/20 0753 07/12/20 2108  CKTOTAL 90 46   BNP (last 3 results) No  results for input(s): PROBNP in the last 8760 hours. HbA1C: Recent Labs    07/13/20 0710  HGBA1C 5.8*   CBG: Recent Labs  Lab 07/14/20 1605 07/14/20 2101 07/15/20 0556 07/15/20 0744 07/15/20 1156  GLUCAP 101* 99 95 106* 115*   Lipid Profile: No results for input(s): CHOL, HDL, LDLCALC, TRIG, CHOLHDL, LDLDIRECT in the last 72 hours. Thyroid Function Tests: Recent Labs    07/13/20 0710  TSH 5.998*   Anemia Panel: Recent Labs    07/13/20 0710  VITAMINB12 1,048*  FOLATE 35.0  FERRITIN 46  TIBC 269  IRON 22*  RETICCTPCT 1.5   Sepsis Labs: Recent Labs  Lab 07/09/20 0139 07/12/20 2108  PROCALCITON  --  <0.10  LATICACIDVEN 0.5 0.9    Recent Results (from the past 240 hour(s))  Culture, blood (routine x 2)     Status: None   Collection Time: 07/09/20  1:39 AM   Specimen: BLOOD  Result Value Ref Range Status   Specimen Description BLOOD RIGHT AC  Final   Special Requests   Final    BOTTLES DRAWN AEROBIC AND ANAEROBIC Blood Culture results may not be optimal due to an excessive volume of blood received in culture bottles   Culture   Final    NO GROWTH 5 DAYS Performed at [facility], [address] [place], [place] [address]    Report Status 07/14/2020 FINAL  Final  SARS Coronavirus 2 by RT PCR (hospital order, performed in [facility] hospital lab) Nasopharyngeal Nasopharyngeal Swab     Status: None   Collection Time: 07/09/20  2:49 AM   Specimen: Nasopharyngeal Swab  Result Value Ref Range Status   SARS Coronavirus 2 NEGATIVE NEGATIVE Final    Comment: (NOTE) SARS-CoV-2 target nucleic acids are NOT DETECTED.  The SARS-CoV-2 RNA is generally detectable in upper and lower respiratory specimens during the acute phase of infection. The lowest concentration of SARS-CoV-2 viral copies this assay can detect is 250 copies / mL. A negative result does not preclude SARS-CoV-2 infection and should not be used as the sole basis for treatment or  other patient management decisions.  A negative result may occur with improper specimen collection / handling, submission of specimen other than nasopharyngeal swab, presence of viral mutation(s) within the areas targeted by this assay, and inadequate number of viral copies (<250 copies / mL). A negative result must be combined with clinical observations, patient history, and epidemiological information.  Fact Sheet for Patients:  StrictlyIdeas.no  Fact Sheet for Healthcare Providers: BankingDealers.co.za  This test is not yet approved or  cleared by the [place] FDA and has been authorized for detection and/or diagnosis of SARS-CoV-2 by FDA under an Emergency Use Authorization (EUA).  This EUA will remain in effect (meaning this test can be used) for the duration of the COVID-19 declaration under Section 564(b)(1) of the Act, 21 U.S.C. section 360bbb-3(b)(1), unless the authorization is terminated or revoked sooner.  Performed at [facility], [address] [place], [place] [address]   SARS Coronavirus 2 by RT PCR (hospital order, performed in [facility] hospital lab) Nasopharyngeal Nasopharyngeal Swab     Status: None   Collection Time: 07/10/20  2:48 PM   Specimen: Nasopharyngeal Swab  Result Value Ref Range Status   SARS Coronavirus 2 NEGATIVE NEGATIVE Final    Comment: (NOTE) SARS-CoV-2 target nucleic acids are NOT DETECTED.  The SARS-CoV-2 RNA is generally detectable in upper and lower respiratory specimens during the acute phase of infection. The lowest concentration of SARS-CoV-2 viral copies this assay can detect is 250 copies / mL. A negative result does not preclude SARS-CoV-2 infection and should not be used as the sole basis for treatment or other patient management decisions.  A negative result may occur with improper specimen collection / handling, submission of specimen other than nasopharyngeal  swab, presence of viral mutation(s) within the areas targeted by this assay, and inadequate number of viral copies (<250 copies / mL). A negative result must be combined with clinical observations, patient history, and epidemiological information.  Fact Sheet for Patients:   StrictlyIdeas.no  Fact Sheet for Healthcare Providers: BankingDealers.co.za  This test is not yet approved or  cleared by the [place] FDA and has been authorized for detection and/or diagnosis of SARS-CoV-2 by FDA under an Emergency Use Authorization (EUA).  This EUA will remain in effect (meaning this test can be used) for the duration of the COVID-19 declaration under Section 564(b)(1) of the Act, 21 U.S.C. section 360bbb-3(b)(1), unless the authorization is terminated or revoked sooner.  Performed at [facility], [address], [place], [place] [address]   Urine culture     Status: Abnormal (Preliminary result)   Collection Time: 07/12/20  9:23 PM   Specimen: Urine, Random  Result Value Ref Range Status   Specimen Description   Final    URINE, RANDOM Performed at [facility], [address] [place], [place] [address]    Special Requests   Final    NONE Performed at [facility], [address] [place], [place] [address]    Culture MULTIPLE SPECIES PRESENT, SUGGEST RECOLLECTION (A)  Final   Report Status PENDING  Incomplete  Culture, blood (routine x 2)     Status: None (Preliminary result)   Collection Time: 07/12/20  9:23 PM   Specimen: BLOOD  Result Value Ref Range Status   Specimen Description BLOOD RIGHT AC  Final   Special Requests   Final    BOTTLES DRAWN AEROBIC AND ANAEROBIC Blood Culture results may not be optimal due to an excessive volume of blood received in culture bottles   Culture   Final    NO GROWTH 3 DAYS Performed at [facility], [address] [place], [place] [address]    Report  Status PENDING  Incomplete  Culture, blood (routine x 2)     Status: None (Preliminary result)   Collection Time: 07/12/20  9:23 PM   Specimen:  BLOOD  Result Value Ref Range Status   Specimen Description BLOOD RIGHT WRIST  Final   Special Requests   Final    BOTTLES DRAWN AEROBIC AND ANAEROBIC Blood Culture adequate volume   Culture   Final    NO GROWTH 3 DAYS Performed at [facility], [address] [place], [place] [address]    Report Status PENDING  Incomplete  SARS Coronavirus 2 by RT PCR (hospital order, performed in [facility] hospital lab) Nasopharyngeal Nasopharyngeal Swab     Status: None   Collection Time: 07/12/20 11:13 PM   Specimen: Nasopharyngeal Swab  Result Value Ref Range Status   SARS Coronavirus 2 NEGATIVE NEGATIVE Final    Comment: (NOTE) SARS-CoV-2 target nucleic acids are NOT DETECTED.  The SARS-CoV-2 RNA is generally detectable in upper and lower respiratory specimens during the acute phase of infection. The lowest concentration of SARS-CoV-2 viral copies this assay can detect is 250 copies / mL. A negative result does not preclude SARS-CoV-2 infection and should not be used as the sole basis for treatment or other patient management decisions.  A negative result may occur with improper specimen collection / handling, submission of specimen other than nasopharyngeal swab, presence of viral mutation(s) within the areas targeted by this assay, and inadequate number of viral copies (<250 copies / mL). A negative result must be combined with clinical observations, patient history, and epidemiological information.  Fact Sheet for Patients:   StrictlyIdeas.no  Fact Sheet for Healthcare Providers: BankingDealers.co.za  This test is not yet approved or  cleared by the [place] FDA and has been authorized for detection and/or diagnosis of SARS-CoV-2 by FDA under an Emergency Use Authorization (EUA).   This EUA will remain in effect (meaning this test can be used) for the duration of the COVID-19 declaration under Section 564(b)(1) of the Act, 21 U.S.C. section 360bbb-3(b)(1), unless the authorization is terminated or revoked sooner.  Performed at [facility], [address] [place], [place] [address]   MRSA PCR Screening     Status: None   Collection Time: 07/13/20  2:02 AM   Specimen: Nasal Mucosa; Nasopharyngeal  Result Value Ref Range Status   MRSA by PCR NEGATIVE NEGATIVE Final    Comment:        The GeneXpert MRSA Assay (FDA approved for NASAL specimens only), is one component of a comprehensive MRSA colonization surveillance program. It is not intended to diagnose MRSA infection nor to guide or monitor treatment for MRSA infections. Performed at [facility], [address], [place], [place] [address]          Radiology Studies: CT ABDOMEN PELVIS W CONTRAST  Result Date: 07/14/2020 CLINICAL DATA:  Right-sided abdominal pain. Fever. Epigastric pain. EXAM: CT ABDOMEN AND PELVIS WITH CONTRAST TECHNIQUE: Multidetector CT imaging of the abdomen and pelvis was performed using the standard protocol following bolus administration of intravenous contrast. CONTRAST:  19m OMNIPAQUE IOHEXOL 300 MG/ML  SOLN COMPARISON:  04/01/2020 FINDINGS: Lower chest: Minor linear atelectasis at the dependent lung bases. No acute findings. Hepatobiliary: Liver normal in size. Several subcentimeter low-density liver lesions are noted consistent with cysts stable from the prior exam. No other liver masses or lesions. Status post cholecystectomy. Mild chronic intra and extrahepatic bile duct dilation, also stable. Pancreas: Fluid collection extends from just posterior to the pancreatic neck, extending along the left margin of the uncinate process, passing posterior to the superior mesenteric vein and collecting along the right anterior pararenal fascia. This  collection measures  approximately 14 cm from superior to inferior by 4 x 10 cm in greatest transverse dimension. Pancreas itself is no attenuation with no masses and no evidence of inflammation. Mild prominence of the pancreatic duct is stable from the prior CT. Spleen: Normal in size without focal abnormality. Adrenals/Urinary Tract: No adrenal masses. Kidneys normal in size, orientation and position with symmetric enhancement and excretion. No renal masses, stones or hydronephrosis. Normal ureters. Normal bladder. Stomach/Bowel: Previous gastric bypass. Stomach is mostly decompressed. No wall thickening or inflammation. Small bowel anastomosis noted in the anterior central abdomen. There is also been a previous colon resection with a bowel anastomosis staple line along the low sigmoid colon. These findings are stable. No bowel dilation to suggest obstruction. There is no bowel wall thickening or adjacent inflammation. No evidence of appendicitis. Two adjacent right paramidline low abdominal hernias containing bowel, without evidence of obstruction, strangulation or incarceration, similar to the prior CT. Vascular/Lymphatic: Aortic atherosclerosis. No aneurysm. No enlarged lymph nodes. Reproductive: Status post hysterectomy. No adnexal masses. Other: No ascites. Musculoskeletal: No fracture or acute finding. No osteoblastic or osteolytic lesions. IMPRESSION: 1. Fluid collection at lies along the posterior margin of the pancreas, predominantly collecting along the right anterior pararenal fascia. This may reflect an abscess. It could be a pseudocyst if there is a history of pancreatitis. It may be a postoperative collection, since the patient underwent a cholecystectomy since the prior CT. Collection measures approximately 14 x 4 x 10 cm in size. 2. No other evidence of an acute abnormality within the abdomen or pelvis. 3. Stable changes from previous gastric bypass surgery. 4. Aortic atherosclerosis. Electronically Signed   By: Lajean Manes M.D.   On: 07/14/2020 14:06        Scheduled Meds: . docusate sodium  100 mg Oral BID  . feeding supplement (ENSURE ENLIVE)  237 mL Oral BID BM  . insulin aspart  0-9 Units Subcutaneous Q4H  . [START ON 07/16/2020] multivitamin with minerals  1 tablet Oral Daily  . pantoprazole  20 mg Oral Daily  . rosuvastatin  20 mg Oral Daily  . sodium chloride flush  3 mL Intravenous Q12H   Continuous Infusions: . acyclovir 645 mg (07/15/20 0555)  . cefTRIAXone (ROCEPHIN)  IV 2 g (07/15/20 1200)    Assessment & Plan:   Active Problems:   SIRS (systemic inflammatory response syndrome) (HCC)   Shingles rash   Delirium   Acute metabolic encephalopathy   Essential hypertension   Anemia   Acute lower UTI   Chronic diastolic CHF (congestive heart failure) ([place])   Sepsis ([place])   Change in mental status    . SIRS/sepsis-was present on admission.  Patient met SIRS criteria by fever, heart rate more than 90s . 9/4: Urine culture with mixed species . Was started on Rocephin and vancomycin for concern of meningitis? IDs input was appreciated Calcitonin and lactic acid within normal limits 9/4 per ID with patient having mental status change and also fever there is a concern for zoster meningitis MRI normal Per ID Doubt bacterial meningitis clinically. Patient refused LP 9/5: c/o morning HA's usually, likely from her OSA as she is not using her cpap here.  Since ?concern for zoster meningitis, ID had recommended to continue acyclovir and continue ceftriaxone once a day.   Vancomycin was discontinued due to cultures being negative  F/U ID recommendation . Encouraged PO hydration/intake    . Shingles-based on her severe chest and back pain, painful  rash rash described going and bandlike symptom and dermatomal fashion likely shingles present on admission 9/5: pain still present with minimal improvement.  Continue iv acyclovir.  Will start low dose gabapentin 163m bid for any  neuropathic pain.    Right Abdominal firmness- etiology unclear.  CT of the abdomen and pelvis found with fluid collection, could be abscess versus pseudocyst versus postop collection since patient had cholecystectomy.  Please see full report. Side lined Dr. RChristian Matewho reviewed the CT scan and felt its postop changes and pseudocyst and possible hernia, to leave it alone. (ok'd by attending to document this). Will monitor.   . Delirium/. Acute metabolic encephalopathy -in the setting of shingles have to consider zoster meningitis  Urine culture was mixed bacteria 9/5: MS stable .  Continue with IV acyclovir along with ceftriaxone  MRI basically unremarkable  Continue to monitor  . Essential hypertension -better BP control Continue amlodipine 5 mg daily and continue to monitor BP  . Anemia -chronic, stable.  Likely from gastric bypass H&H stable Usually post gastric bypass need to be on Iron pills, will d/w pt.   OSA-Has cpap at home. Her morning HA likely from OSA and not using cpap here. Told daughter ok if she wants to bring hers from home for use.   . Acute lower UTI - Urine culture was with mixed flora Currently continue on ceftriaxone as noted above    . Chronic diastolic CHF (congestive heart failure)  Without exacerbation.  Appears euvolemic on exam.   Continue to hold Lasix.   IV fluids discontinued.   Continue monitoring.     Diabetes mellitus - Carb controlled Riss, fs    DVT prophylaxis: SCD Code Status: Full Family Communication: Daughter at bedside updated Status is: Inpatient  Remains inpatient appropriate because:IV treatments appropriate due to intensity of illness or inability to take PO   Dispo: The patient is from: Home              Anticipated d/c is to: Home              Anticipated d/c date is: 2 days              Patient currently is not medically stable to d/c.  Patient requiring IV medications, concern for meningitis. Decrease po  intake. Pain not well controlled still.            LOS: 2 days   Time spent: 35 min with >50% on coc    SNolberto Hanlon MD [facility] Pager [phone]  If 7PM-7AM, please contact night-coverage [email or web address] Password [email or web address] 07/15/2020, 1:37 PM

## 2020-07-15 NOTE — Progress Notes (Addendum)
Occupational Therapy Treatment Patient Details Name: Casey Baldwin MRN: [id number] DOB: 05-31-1941 Today's Date: 07/15/2020    History of present illness Casey Baldwin is a 79 y.o. 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  Presented to ER secondary to generalized body aches, progressive weakness, headaches; admitted for management of SIRS (urine?), shingles and acute metabolic encephalopathy.   OT comments  Casey Baldwin was seen for OT treatment on this date. Upon arrival to room pt awake reclined in chair reading newspaper and eating meal. Pt initially declines therapy stating too fatigued this date, then requests to return to bed 2/2 discomfort. Pt follows commands however requires repetition (unclear HoH vs cognition). Requires SBA + single UE support on rail/armrest for SPT chair>bed. SUPERVISION sit>sup c bed flat. MIN A for LBD at bed level (limited by abdominal pain). SETUP self-feeding reclined in chair. Pt making progress toward goals. Pt continues to benefit from skilled OT services to maximize return to PLOF and minimize risk of future falls, injury, caregiver burden, and readmission. Will continue to follow POC. Discharge recommendation remains appropriate.    Follow Up Recommendations  Home health OT;Supervision - 24/7   Equipment Recommendations  None recommended by OT    Recommendations for Other Services      Precautions / Restrictions Precautions Precautions: Fall Restrictions Weight Bearing Restrictions: No       Mobility Bed Mobility Overal bed mobility: Needs Assistance Bed Mobility: Sit to Supine;Rolling Rolling: Modified independent (Device/Increase time)     Sit to supine: Supervision   General bed mobility comments: flat bed and rail use  Transfers Overall transfer level: Needs assistance   Transfers: Sit to/from Stand;Stand Pivot Transfers Sit to Stand:  Supervision Stand pivot transfers: Min guard       General transfer comment: SBA + rail use SPT chair>bed    Balance Overall balance assessment: Needs assistance Sitting-balance support: No upper extremity supported;Feet supported Sitting balance-Leahy Scale: Good     Standing balance support: Single extremity supported Standing balance-Leahy Scale: Good        ADL either performed or assessed with clinical judgement   ADL Overall ADL's : Needs assistance/impaired    General ADL Comments: SETUP self-feeding reclined in chair. SBA + rail SPT chair>bed. MIN A for LBD at bed level (limited by abdominal pain)       Cognition Arousal/Alertness: Awake/alert Behavior During Therapy: Flat affect      General Comments: Follows commands, has difficulty maintaining conversation - may be r/t HoH as pt requires repetition for appropriate answers        Exercises Exercises: Other exercises Other Exercises Other Exercises: Self-feeding, bed mobility, SPT, sit>sup, sitting/standing balance/tolerance     Pertinent Vitals/ Pain       Pain Assessment: Faces Faces Pain Scale: Hurts a little bit Pain Location: abdominal pain Pain Descriptors / Indicators: Grimacing;Discomfort Pain Intervention(s): Limited activity within patient's tolerance;Repositioned   Frequency  Min 1X/week        Progress Toward Goals  OT Goals(current goals can now be found in the care plan section)  Progress towards OT goals: Progressing toward goals  Acute Rehab OT Goals Patient Stated Goal: To return home  OT Goal Formulation: With patient Time For Goal Achievement: 07/27/20 Potential to Achieve Goals: Good ADL Goals Pt Will Perform Grooming: with modified independence;sitting Pt Will Perform Lower Body Dressing: with supervision;sit to/from stand (c LRAD PRN) Pt Will Transfer to Toilet: ambulating;regular  height toilet;with modified independence (c LRAD PRN)  Plan Discharge plan remains  appropriate;Frequency remains appropriate    AM-PAC OT "6 Clicks" Daily Activity     Outcome Measure   Help from another person eating meals?: None Help from another person taking care of personal grooming?: A Little Help from another person toileting, which includes using toliet, bedpan, or urinal?: A Little Help from another person bathing (including washing, rinsing, drying)?: A Little Help from another person to put on and taking off regular upper body clothing?: A Little Help from another person to put on and taking off regular lower body clothing?: A Little 6 Click Score: 19    End of Session    OT Visit Diagnosis: Other abnormalities of gait and mobility (R26.89)   Activity Tolerance Patient limited by fatigue   Patient Left in bed;with call bell/phone within reach;with bed alarm set   Nurse Communication          Time: 6837-2902 OT Time Calculation (min): 15 min  Charges: OT General Charges $OT Visit: 1 Visit OT Treatments $Self Care/Home Management : 8-22 mins  Kathie Dike, M.S. OTR/L  07/15/20, 2:43 PM  ascom [phone]

## 2020-07-15 NOTE — Plan of Care (Signed)
  Problem: Education: Goal: Knowledge of General Education information will improve Description Including pain rating scale, medication(s)/side effects and non-pharmacologic comfort measures Outcome: Progressing   

## 2020-07-16 DIAGNOSIS — B021 Zoster meningitis: Secondary | ICD-10-CM

## 2020-07-16 DIAGNOSIS — R079 Chest pain, unspecified: Secondary | ICD-10-CM

## 2020-07-16 DIAGNOSIS — R52 Pain, unspecified: Secondary | ICD-10-CM | POA: Diagnosis not present

## 2020-07-16 DIAGNOSIS — R519 Headache, unspecified: Secondary | ICD-10-CM | POA: Diagnosis not present

## 2020-07-16 DIAGNOSIS — R509 Fever, unspecified: Secondary | ICD-10-CM | POA: Diagnosis not present

## 2020-07-16 LAB — CBC
HCT: 33.8 % — ABNORMAL LOW (ref 36.0–46.0)
Hemoglobin: 11.7 g/dL — ABNORMAL LOW (ref 12.0–15.0)
MCH: 32.6 pg (ref 26.0–34.0)
MCHC: 34.6 g/dL (ref 30.0–36.0)
MCV: 94.2 fL (ref 80.0–100.0)
Platelets: 297 10*3/uL (ref 150–400)
RBC: 3.59 MIL/uL — ABNORMAL LOW (ref 3.87–5.11)
RDW: 13.8 % (ref 11.5–15.5)
WBC: 6.4 10*3/uL (ref 4.0–10.5)
nRBC: 0 % (ref 0.0–0.2)

## 2020-07-16 LAB — COMPREHENSIVE METABOLIC PANEL
ALT: 13 U/L (ref 0–44)
AST: 18 U/L (ref 15–41)
Albumin: 3.4 g/dL — ABNORMAL LOW (ref 3.5–5.0)
Alkaline Phosphatase: 64 U/L (ref 38–126)
Anion gap: 12 (ref 5–15)
BUN: 15 mg/dL (ref 8–23)
CO2: 25 mmol/L (ref 22–32)
Calcium: 8.6 mg/dL — ABNORMAL LOW (ref 8.9–10.3)
Chloride: 95 mmol/L — ABNORMAL LOW (ref 98–111)
Creatinine, Ser: 0.77 mg/dL (ref 0.44–1.00)
GFR calc Af Amer: >60 mL/min (ref >60)
GFR calc non Af Amer: >60 mL/min (ref >60)
Glucose, Bld: 120 mg/dL — ABNORMAL HIGH (ref 70–99)
Potassium: 3.3 mmol/L — ABNORMAL LOW (ref 3.5–5.1)
Sodium: 132 mmol/L — ABNORMAL LOW (ref 135–145)
Total Bilirubin: 0.7 mg/dL (ref 0.3–1.2)
Total Protein: 6.8 g/dL (ref 6.5–8.1)

## 2020-07-16 LAB — GLUCOSE, CAPILLARY
Glucose-Capillary: 107 mg/dL — ABNORMAL HIGH (ref 70–99)
Glucose-Capillary: 117 mg/dL — ABNORMAL HIGH (ref 70–99)
Glucose-Capillary: 120 mg/dL — ABNORMAL HIGH (ref 70–99)
Glucose-Capillary: 121 mg/dL — ABNORMAL HIGH (ref 70–99)
Glucose-Capillary: 127 mg/dL — ABNORMAL HIGH (ref 70–99)
Glucose-Capillary: 99 mg/dL (ref 70–99)

## 2020-07-16 MED ORDER — METOPROLOL TARTRATE 25 MG PO TABS
12.5000 mg | ORAL_TABLET | Freq: Two times a day (BID) | ORAL | Status: DC
  Administered 2020-07-16 – 2020-07-29 (×24): 12.5 mg via ORAL
  Filled 2020-07-16 (×6): qty 1
  Filled 2020-07-16: qty 0.5
  Filled 2020-07-16 (×22): qty 1

## 2020-07-16 MED ORDER — METOPROLOL TARTRATE 5 MG/5ML IV SOLN: 5.0000 mg | Freq: Once | INTRAVENOUS | Status: DC

## 2020-07-16 MED ORDER — HYDRALAZINE HCL 10 MG PO TABS: 10.0000 mg | ORAL_TABLET | Freq: Three times a day (TID) | ORAL | Status: DC

## 2020-07-16 MED ORDER — FERROUS FUMARATE 324 (106 FE) MG PO TABS
1.0000 | ORAL_TABLET | Freq: Two times a day (BID) | ORAL | Status: DC
  Administered 2020-07-16 – 2020-08-18 (×56): 106 mg via ORAL
  Filled 2020-07-16 (×68): qty 1

## 2020-07-16 NOTE — Progress Notes (Signed)
PROGRESS NOTE    Casey Baldwin  LYY:[id number] DOB: 1941-07-29 DOA: 07/12/2020 PCP: Annice Needy, MD    Brief Narrative:  Casey Baldwin is a 79 y.o. 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 Her temperature at home was up to 102.2 Patient was very weak and had hard time getting up Family recently noted a rash that was going around her right side of back and right breast.  Rash is intensely pruritic different stages of healing some areas of vesicular be red border.  Some areas covered in black eschars.  Skin is diffusely tender.Per family (asked by me) pt developed confusion. Was c/o side to back pain.  Sed rate was elevated.  LP was considered but patient declined stating that she had a history of meningitis in the past and very scared of having LP done.  Hospitalist was called for admission for shingles and possible encephalitis.    Consultants:   ID  Procedures:   Antimicrobials:   Acyclovir IV started on 9/3  Cefepime IV started on 9/2 x1   Ceftriaxone 2 g started on 9/3  Vancomycin started on 9/3   Subjective: Patient complaining of neuropathic pain bandlike which she has been.  Has morning headache.  Did not use CPAP as it was not hooked up for her per daughter who is at bedside.  Denies shortness of breath, chest pain, or abdominal pain.  Objective: Vitals:   07/15/20 0747 07/15/20 1728 07/16/20 0019 07/16/20 0810  BP: (!) 148/80 (!) 148/69 (!) 149/83 (!) 150/72  Pulse: 94 84 94 82  Resp: '18 18 19 15  ' Temp: 99.7 F (37.6 C) 99.2 F (37.3 C) 97.9 F (36.6 C) 98.8 F (37.1 C)  TempSrc: Oral Oral Oral Oral  SpO2: 93% 97% 95% 95%  Weight:      Height:        Intake/Output Summary (Last 24 hours) at 07/16/2020 1330 Last data filed at 07/15/2020 1800 Gross per 24 hour  Intake 600 ml    Output --  Net 600 ml   Filed Weights   07/12/20 0918 07/12/20 0921  Weight: 79.4 kg 79.4 kg    Examination: Sitting up in bed, NAD Clear to auscultation no wheeze rales rhonchi's  regular S1-S2 no gallops Soft benign positive bowel sounds No edema Mood and affect appropriate in current setting Alert oriented x3, grossly intact      Data Reviewed: I have personally reviewed following labs and imaging studies  CBC: Recent Labs  Lab 07/10/20 0753 07/12/20 0919 07/12/20 2108 07/13/20 0710  WBC 7.2 6.3 5.5 4.9  NEUTROABS 5.5  --  3.8 3.0  HGB 10.2* 10.9* 9.9* 9.2*  HCT 31.7* 32.3* 30.2* 28.3*  MCV 100.0 95.8 98.4 99.3  PLT 258 269 246 127   Basic Metabolic Panel: Recent Labs  Lab 07/10/20 0753 07/12/20 0919 07/12/20 2108 07/13/20 0710 07/14/20 0830 07/15/20 0935  NA 133* 136  --  134* 131* 132*  K 3.6 3.8  --  3.3* 3.7 3.8  CL 100 99  --  102 99 97*  CO2 25 24  --  23 21* 23  GLUCOSE 171* 128*  --  108* 100* 112*  BUN 12 16  --  '15 12 15  ' CREATININE 1.02* 1.01*  --  0.86 0.85 0.91  CALCIUM 8.7* 8.8*  --  8.3*  8.6* 8.8*  MG  --   --  1.8 1.8  --   --   PHOS  --   --   --  3.8  --   --    GFR: Estimated Creatinine Clearance: 50.3 mL/min (by C-G formula based on SCr of 0.91 mg/dL). Liver Function Tests: Recent Labs  Lab 07/10/20 0753 07/12/20 2108 07/13/20 0710  AST 16 14* 14*  ALT '15 12 11  ' ALKPHOS 61 62 55  BILITOT 1.1 1.1 0.9  PROT 7.1 7.1 6.3*  ALBUMIN 3.8 3.6 3.3*   Recent Labs  Lab 07/12/20 2108  LIPASE 27   No results for input(s): AMMONIA in the last 168 hours. Coagulation Profile: No results for input(s): INR, PROTIME in the last 168 hours. Cardiac Enzymes: Recent Labs  Lab 07/10/20 0753 07/12/20 2108  CKTOTAL 90 46   BNP (last 3 results) No results for input(s): PROBNP in the last 8760 hours. HbA1C: No results for input(s): HGBA1C in the last 72 hours. CBG: Recent Labs  Lab 07/15/20 2111 07/16/20 0017 07/16/20 0426  07/16/20 0812 07/16/20 1211  GLUCAP 159* 107* 99 117* 127*   Lipid Profile: No results for input(s): CHOL, HDL, LDLCALC, TRIG, CHOLHDL, LDLDIRECT in the last 72 hours. Thyroid Function Tests: No results for input(s): TSH, T4TOTAL, FREET4, T3FREE, THYROIDAB in the last 72 hours. Anemia Panel: No results for input(s): VITAMINB12, FOLATE, FERRITIN, TIBC, IRON, RETICCTPCT in the last 72 hours. Sepsis Labs: Recent Labs  Lab 07/12/20 2108  PROCALCITON <0.10  LATICACIDVEN 0.9    Recent Results (from the past 240 hour(s))  Culture, blood (routine x 2)     Status: None   Collection Time: 07/09/20  1:39 AM   Specimen: BLOOD  Result Value Ref Range Status   Specimen Description BLOOD RIGHT [facility]  Final   Special Requests   Final    BOTTLES DRAWN AEROBIC AND ANAEROBIC Blood Culture results may not be optimal due to an excessive volume of blood received in culture bottles   Culture   Final    NO GROWTH 5 DAYS Performed at [facility], [address] [place], [place] [address]    Report Status 07/14/2020 FINAL  Final  SARS Coronavirus 2 by RT PCR (hospital order, performed in [facility] hospital lab) Nasopharyngeal Nasopharyngeal Swab     Status: None   Collection Time: 07/09/20  2:49 AM   Specimen: Nasopharyngeal Swab  Result Value Ref Range Status   SARS Coronavirus 2 NEGATIVE NEGATIVE Final    Comment: (NOTE) SARS-CoV-2 target nucleic acids are NOT DETECTED.  The SARS-CoV-2 RNA is generally detectable in upper and lower respiratory specimens during the acute phase of infection. The lowest concentration of SARS-CoV-2 viral copies this assay can detect is 250 copies / mL. A negative result does not preclude SARS-CoV-2 infection and should not be used as the sole basis for treatment or other patient management decisions.  A negative result may occur with improper specimen collection / handling, submission of specimen other than nasopharyngeal swab, presence of viral  mutation(s) within the areas targeted by this assay, and inadequate number of viral copies (<250 copies / mL). A negative result must be combined with clinical observations, patient history, and epidemiological information.  Fact Sheet for Patients:   StrictlyIdeas.no  Fact Sheet for Healthcare Providers: BankingDealers.co.za  This test is not yet approved or  cleared by the [place] FDA and has been authorized for detection and/or diagnosis of SARS-CoV-2 by FDA under an  Emergency Use Authorization (EUA).  This EUA will remain in effect (meaning this test can be used) for the duration of the COVID-19 declaration under Section 564(b)(1) of the Act, 21 U.S.C. section 360bbb-3(b)(1), unless the authorization is terminated or revoked sooner.  Performed at [facility], [address] [place], [place] [address]   SARS Coronavirus 2 by RT PCR (hospital order, performed in [facility] hospital lab) Nasopharyngeal Nasopharyngeal Swab     Status: None   Collection Time: 07/10/20  2:48 PM   Specimen: Nasopharyngeal Swab  Result Value Ref Range Status   SARS Coronavirus 2 NEGATIVE NEGATIVE Final    Comment: (NOTE) SARS-CoV-2 target nucleic acids are NOT DETECTED.  The SARS-CoV-2 RNA is generally detectable in upper and lower respiratory specimens during the acute phase of infection. The lowest concentration of SARS-CoV-2 viral copies this assay can detect is 250 copies / mL. A negative result does not preclude SARS-CoV-2 infection and should not be used as the sole basis for treatment or other patient management decisions.  A negative result may occur with improper specimen collection / handling, submission of specimen other than nasopharyngeal swab, presence of viral mutation(s) within the areas targeted by this assay, and inadequate number of viral copies (<250 copies / mL). A negative result must be combined with  clinical observations, patient history, and epidemiological information.  Fact Sheet for Patients:   StrictlyIdeas.no  Fact Sheet for Healthcare Providers: BankingDealers.co.za  This test is not yet approved or  cleared by the [place] FDA and has been authorized for detection and/or diagnosis of SARS-CoV-2 by FDA under an Emergency Use Authorization (EUA).  This EUA will remain in effect (meaning this test can be used) for the duration of the COVID-19 declaration under Section 564(b)(1) of the Act, 21 U.S.C. section 360bbb-3(b)(1), unless the authorization is terminated or revoked sooner.  Performed at [facility], [address] [place], [place] [address]   Urine culture     Status: Abnormal   Collection Time: 07/12/20  9:23 PM   Specimen: Urine, Random  Result Value Ref Range Status   Specimen Description   Final    URINE, RANDOM Performed at [facility], [address], [place], [place] [address]    Special Requests   Final    NONE Performed at [facility], [address] [place], [place] [address]    Culture MULTIPLE SPECIES PRESENT, SUGGEST RECOLLECTION (A)  Final   Report Status 07/15/2020 FINAL  Final  Culture, blood (routine x 2)     Status: None (Preliminary result)   Collection Time: 07/12/20  9:23 PM   Specimen: BLOOD  Result Value Ref Range Status   Specimen Description BLOOD RIGHT AC  Final   Special Requests   Final    BOTTLES DRAWN AEROBIC AND ANAEROBIC Blood Culture results may not be optimal due to an excessive volume of blood received in culture bottles   Culture   Final    NO GROWTH 4 DAYS Performed at [facility], [address], [place], [place] [address]    Report Status PENDING  Incomplete  Culture, blood (routine x 2)     Status: None (Preliminary result)   Collection Time: 07/12/20  9:23 PM   Specimen: BLOOD  Result Value Ref Range Status    Specimen Description BLOOD RIGHT WRIST  Final   Special Requests   Final    BOTTLES DRAWN AEROBIC AND ANAEROBIC Blood Culture adequate volume   Culture  Final    NO GROWTH 4 DAYS Performed at [facility], [address] [place], [place] [address]    Report Status PENDING  Incomplete  SARS Coronavirus 2 by RT PCR (hospital order, performed in [facility] hospital lab) Nasopharyngeal Nasopharyngeal Swab     Status: None   Collection Time: 07/12/20 11:13 PM   Specimen: Nasopharyngeal Swab  Result Value Ref Range Status   SARS Coronavirus 2 NEGATIVE NEGATIVE Final    Comment: (NOTE) SARS-CoV-2 target nucleic acids are NOT DETECTED.  The SARS-CoV-2 RNA is generally detectable in upper and lower respiratory specimens during the acute phase of infection. The lowest concentration of SARS-CoV-2 viral copies this assay can detect is 250 copies / mL. A negative result does not preclude SARS-CoV-2 infection and should not be used as the sole basis for treatment or other patient management decisions.  A negative result may occur with improper specimen collection / handling, submission of specimen other than nasopharyngeal swab, presence of viral mutation(s) within the areas targeted by this assay, and inadequate number of viral copies (<250 copies / mL). A negative result must be combined with clinical observations, patient history, and epidemiological information.  Fact Sheet for Patients:   StrictlyIdeas.no  Fact Sheet for Healthcare Providers: BankingDealers.co.za  This test is not yet approved or  cleared by the [place] FDA and has been authorized for detection and/or diagnosis of SARS-CoV-2 by FDA under an Emergency Use Authorization (EUA).  This EUA will remain in effect (meaning this test can be used) for the duration of the COVID-19 declaration under Section 564(b)(1) of the Act, 21 U.S.C. section 360bbb-3(b)(1),  unless the authorization is terminated or revoked sooner.  Performed at [facility], [address] [place], [place] [address]   MRSA PCR Screening     Status: None   Collection Time: 07/13/20  2:02 AM   Specimen: Nasal Mucosa; Nasopharyngeal  Result Value Ref Range Status   MRSA by PCR NEGATIVE NEGATIVE Final    Comment:        The GeneXpert MRSA Assay (FDA approved for NASAL specimens only), is one component of a comprehensive MRSA colonization surveillance program. It is not intended to diagnose MRSA infection nor to guide or monitor treatment for MRSA infections. Performed at [facility], [address], [place], [place] [address]          Radiology Studies: CT ABDOMEN PELVIS W CONTRAST  Result Date: 07/14/2020 CLINICAL DATA:  Right-sided abdominal pain. Fever. Epigastric pain. EXAM: CT ABDOMEN AND PELVIS WITH CONTRAST TECHNIQUE: Multidetector CT imaging of the abdomen and pelvis was performed using the standard protocol following bolus administration of intravenous contrast. CONTRAST:  155m OMNIPAQUE IOHEXOL 300 MG/ML  SOLN COMPARISON:  04/01/2020 FINDINGS: Lower chest: Minor linear atelectasis at the dependent lung bases. No acute findings. Hepatobiliary: Liver normal in size. Several subcentimeter low-density liver lesions are noted consistent with cysts stable from the prior exam. No other liver masses or lesions. Status post cholecystectomy. Mild chronic intra and extrahepatic bile duct dilation, also stable. Pancreas: Fluid collection extends from just posterior to the pancreatic neck, extending along the left margin of the uncinate process, passing posterior to the superior mesenteric vein and collecting along the right anterior pararenal fascia. This collection measures approximately 14 cm from superior to inferior by 4 x 10 cm in greatest transverse dimension. Pancreas itself is no attenuation with no masses and no evidence of inflammation. Mild  prominence of the pancreatic duct is stable  from the prior CT. Spleen: Normal in size without focal abnormality. Adrenals/Urinary Tract: No adrenal masses. Kidneys normal in size, orientation and position with symmetric enhancement and excretion. No renal masses, stones or hydronephrosis. Normal ureters. Normal bladder. Stomach/Bowel: Previous gastric bypass. Stomach is mostly decompressed. No wall thickening or inflammation. Small bowel anastomosis noted in the anterior central abdomen. There is also been a previous colon resection with a bowel anastomosis staple line along the low sigmoid colon. These findings are stable. No bowel dilation to suggest obstruction. There is no bowel wall thickening or adjacent inflammation. No evidence of appendicitis. Two adjacent right paramidline low abdominal hernias containing bowel, without evidence of obstruction, strangulation or incarceration, similar to the prior CT. Vascular/Lymphatic: Aortic atherosclerosis. No aneurysm. No enlarged lymph nodes. Reproductive: Status post hysterectomy. No adnexal masses. Other: No ascites. Musculoskeletal: No fracture or acute finding. No osteoblastic or osteolytic lesions. IMPRESSION: 1. Fluid collection at lies along the posterior margin of the pancreas, predominantly collecting along the right anterior pararenal fascia. This may reflect an abscess. It could be a pseudocyst if there is a history of pancreatitis. It may be a postoperative collection, since the patient underwent a cholecystectomy since the prior CT. Collection measures approximately 14 x 4 x 10 cm in size. 2. No other evidence of an acute abnormality within the abdomen or pelvis. 3. Stable changes from previous gastric bypass surgery. 4. Aortic atherosclerosis. Electronically Signed   By: Lajean Manes M.D.   On: 07/14/2020 14:06        Scheduled Meds: . docusate sodium  100 mg Oral BID  . feeding supplement (ENSURE ENLIVE)  237 mL Oral BID BM  . gabapentin  100  mg Oral BID  . insulin aspart  0-9 Units Subcutaneous Q4H  . multivitamin with minerals  1 tablet Oral Daily  . pantoprazole  20 mg Oral Daily  . rosuvastatin  20 mg Oral Daily  . sodium chloride flush  3 mL Intravenous Q12H   Continuous Infusions: . acyclovir 645 mg (07/16/20 1055)  . cefTRIAXone (ROCEPHIN)  IV 2 g (07/16/20 1249)    Assessment & Plan:   Active Problems:   SIRS (systemic inflammatory response syndrome) (HCC)   Shingles rash   Delirium   Acute metabolic encephalopathy   Essential hypertension   Anemia   Acute lower UTI   Chronic diastolic CHF (congestive heart failure) ([place])   Sepsis ([place])   Change in mental status    . SIRS/sepsis-was present on admission.  Patient met SIRS criteria by fever, heart rate more than 90s .  Now improved 9/4: Urine culture with mixed species . Was started on Rocephin and vancomycin for concern of meningitis? IDs input was appreciated Calcitonin and lactic acid within normal limits 9/4 per ID with patient having mental status change and also fever there is a concern for zoster meningitis MRI normal  Per ID doubt bacterial meningitis clinically  Patient had refused LP  Still with headaches.  Possibly part of it is she is not using her CPAP here for her OSA and of course is concerned for meningitis  Per IDs recommendation concern for zoster meningitis, ID had recommended to continue IV acyclovir and continue ceftriaxone daily.  Vancomycin was discontinued since her blood cultures were negative. We will follow up with further recommendations from ID     . Shingles-based on her severe chest and back pain, painful rash rash described going and bandlike symptom and dermatomal fashion likely shingles present on admission 9/6:  still with lots of pain. Was started on gabapenting, will change dose from bid to tid. Continue iv acyclovir.      Right Abdominal firmness- etiology unclear.  CT of the abdomen and pelvis found with  fluid collection, could be abscess versus pseudocyst versus postop collection since patient had cholecystectomy.  Please see full report. 9/5-Side lined Dr. Christian Mate who reviewed the CT scan and felt its postop changes and pseudocyst and possible hernia, to leave it alone. (ok'd by attending to document this). 9/6 discussed findings with the patient's daughter.  For now we will continue monitoring since she is asymptomatic   . Delirium/. Acute metabolic encephalopathy -in the setting of shingles have to consider zoster meningitis .  Now resolved Urine culture was mixed bacteria 9/6: Mentating well.  But since questionable concern for source of meningitis will continue IV acyclovir along with ceftriaxone. MRI basically unremarkable We will follow up IDs recommendation Continue to monitor   . Essential hypertension - intermittently labile.  Cant do amlodipine as she is allergic to it For now continue to monitor.   . Anemia -chronic, stable.  Likely from gastric bypass H&H stable Per daughter she is supposed to be taking iron pills but she is not compliant with any of her pills. We will start her on iron pills for bariatric surgery   OSA-on CPAP can use her own.    . Acute lower UTI - Urine culture was with mixed flora She is on ceftriaxone as above   . Chronic diastolic CHF (congestive heart failure)  Without exacerbation, appears euvolemic on exam  Hold Lasix  Continue monitoring       Diabetes mellitus - Carb controlled Riss, fs    DVT prophylaxis: SCD Code Status: Full Family Communication: Daughter at bedside updated Status is: Inpatient  Remains inpatient appropriate because:IV treatments appropriate due to intensity of illness or inability to take PO   Dispo: The patient is from: Home              Anticipated d/c is to: Home              Anticipated d/c date is: 2 days              Patient currently is not medically stable to d/c.  Patient requiring IV  medications, concern for meningitis. Decrease po intake. Pain not well controlled still.            LOS: 3 days   Time spent: 35 min with >50% on coc    Nolberto Hanlon, MD [facility] Pager [phone]  If 7PM-7AM, please contact night-coverage [email or web address] Password [email or web address] 07/16/2020, 1:30 PM

## 2020-07-16 NOTE — Progress Notes (Signed)
79 y.o. female with a history of CAD, HTN, gastric bypass admitted with headache, leg pain, fever of nearly 6 days along with a painful rash on the rt side of her back and chest. Pt had presented three times to the ED in the past week with headache, leg pain and rt sided chest pain on 8/29, 8/30 and 8/31 . Underwent CTA on 07/10/20 and was r/o for PE and sent home. She returned on 9/2 with headache and fever   Subjective Pt answers questions and then becomes silent and stares Daughter said she was doing this at [facility] during her last hospitalization    O/e awake - intermittently confused Chest B/l air entry Hss1s2 Abd soft Cns non focal No neck stiffness Skin lesions on her back healing  Labs CBC Latest Ref Rng & Units 07/13/2020 07/12/2020 07/12/2020  WBC 4.0 - 10.5 K/uL 4.9 5.5 6.3  Hemoglobin 12.0 - 15.0 g/dL 0.2(R) 4.2(H) 10.9(L)  Hematocrit 36 - 46 % 28.3(L) 30.2(L) 32.3(L)  Platelets 150 - 400 K/uL 227 246 269    CMP Latest Ref Rng & Units 07/15/2020 07/14/2020 07/13/2020  Glucose 70 - 99 mg/dL 062(B) 762(G) 315(V)  BUN 8 - 23 mg/dL 15 12 15   Creatinine 0.44 - 1.00 mg/dL 7.61 6.07  Sodium 135 - 145 mmol/L 132(L) 131(L) 134(L)  Potassium 3.5 - 5.1 mmol/L 3.8 3.7 3.3(L)  Chloride 98 - 111 mmol/L 97(L) 99 102  CO2 22 - 32 mmol/L 23 21(L) 23  Calcium 8.9 - 10.3 mg/dL 3.71) 0.6(Y) 8.3(L)  Total Protein 6.5 - 8.1 g/dL - - 6.3(L)  Total Bilirubin 0.3 - 1.2 mg/dL - - 0.9  Alkaline Phos 38 - 126 U/L - - 55  AST 15 - 41 U/L - - 14(L)  ALT 0 - 44 U/L - - 11    Impression/recommendation Fever, headache, painful rash on the rt side of the back and chest with radiating pain from back to front  Herpes zoster Possible viral meningits ( she refused LP) On acyclovir Was on vanco and ceftriaxone and the former was stopped Can stop ceftriaxone as well  Encephalopathy- coould be from oxycodone- had delirium while at [facility] recently  R/o Co2 retention with OSA and not having CPAP for the past  4 days Acyclovir can cause encephalopathy but less likely as renal function okay. Will change to valtrex tomorrow  ?she recently had a complicated stay at [facility] between  04/01/20 until 05/14/20 for abdominal pain which was diagnosed as choledocholithiasis/cholangitis in a setting of prior RYGB in 2008. 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   Rt sided abdominal firmness and fullness CT abdomen shows Fluid collection at lies along the posterior margin of the pancreas, predominantly collecting along the right anterior pararenal fascia. This may reflect an abscess. It could be a pseudocyst if there is a history of pancreatitis. It may be a postoperative collection, since the patient underwent a cholecystectomy since the prior CT. Collection measures approximately 14 x 4 x 10 cm in size. If  she has fever it willl have to be aspirated and cultured   Urine culture multiple species-received 5 days of ceftriaxone Hearing loss   Left TKA Fully vaccinated for COVID jan 2021  Discussed the management with  her daughter and Hospitalist ?

## 2020-07-16 NOTE — Progress Notes (Signed)
Rapid Response Event Note   Reason for Call : tachycardia   Initial Focused Assessment: Patient in bed, reporting chest discomfort. Per bedside nurse, prior set of vitals was normal, however when they went in to see patient, patient tachycardic with rate in the 160s, BP stable with SBP in 120s. Dr. Marylu Lund had been notified.    Interventions: EKG ordered by this RN, 5mg  of IV metoprolol ordered by Dr. as well as IVF bolus. As pt was being hooked up for EKG, rate dropped from 160s to mid 90s. Dr. Marylu Lund notified, IV metoprolol discontinued, PO metoprolol ordered.    Plan of Care: Patient to remain on 1A with cardiac monitoring. Bedside nurse understands that she can call with questions or concerns at any time.     Event Summary:   MD Notified: Amery Call Time: 1645 Arrival Time: 1647 End Time: Marylu Lund, RN

## 2020-07-16 NOTE — Progress Notes (Signed)
   07/16/20 1646  Vitals  Temp 98.8 F (37.1 C)  Temp Source Oral  BP 125/73  MAP (mmHg) 82  BP Location Right Arm  BP Method Automatic  Patient Position (if appropriate) Lying  Pulse Rate (!) 166  Pulse Rate Source Monitor  Resp 16  Provider Notification  Provider Name/Title Dr. Marylu Lund  Date Provider Notified 07/16/20  Time Provider Notified 1644  Notification Type Page  Notification Reason Other (Comment) (elevating HR)  Response See new orders  Date of Provider Response 07/16/20  Time of Provider Response 1650  Rapid Response Notification  Name of Rapid Response RN Notified Sarah, RN  Date Rapid Response Notified 07/16/20  Time Rapid Response Notified 1642  Notified MD regarding to elevating temperature at 100.3 at 16:01. PT continued with elevating HR. 163 at 16:44, HR:166 at 16:46 , and sustained around the 160. Rapid response nurse called to floor. Pt c/o chest pain and having difficulty breathing. Assessed oxygen level. O2:94% on room air. Rapid Nurse applied 2L/O2 for comfort measure. Per MD, perform EKG now.  EKG completed by NT. Administered IVF 75 mL/Hr sodium chloride, per verbal order by MD and Give 12.5 mg metoprolol now. HR:106  And oral temp: 98.8 at 17:00.  Will monitor

## 2020-07-16 NOTE — Care Management Important Message (Signed)
Important Message  Patient Details  Name: Casey Baldwin MRN: [id number] Date of Birth: 02/09/1941   Medicare Important Message Given:  Yes  Reviewed with patient via room phone due to isolation status.  Copy of Medicare IM to be left with unit secretary for staff to bring in next time someone need to enter room.    [name] 07/16/2020, 1:28 PM

## 2020-07-16 NOTE — Plan of Care (Signed)
  Problem: Education: Goal: Knowledge of General Education information will improve Description Including pain rating scale, medication(s)/side effects and non-pharmacologic comfort measures Outcome: Progressing   

## 2020-07-17 ENCOUNTER — Inpatient Hospital Stay
Admit: 2020-07-17 | Discharge: 2020-07-17 | Disposition: A | Discharge status: Home / Self Care | Payer: Medicare Other | Attending: Internal Medicine | Admitting: Internal Medicine

## 2020-07-17 ENCOUNTER — Inpatient Hospital Stay: Payer: Medicare Other

## 2020-07-17 LAB — CULTURE, BLOOD (ROUTINE X 2)
Culture: NO GROWTH
Culture: NO GROWTH
Special Requests: ADEQUATE

## 2020-07-17 LAB — GLUCOSE, CAPILLARY
Glucose-Capillary: 104 mg/dL — ABNORMAL HIGH (ref 70–99)
Glucose-Capillary: 121 mg/dL — ABNORMAL HIGH (ref 70–99)
Glucose-Capillary: 127 mg/dL — ABNORMAL HIGH (ref 70–99)
Glucose-Capillary: 134 mg/dL — ABNORMAL HIGH (ref 70–99)
Glucose-Capillary: 135 mg/dL — ABNORMAL HIGH (ref 70–99)
Glucose-Capillary: 92 mg/dL (ref 70–99)
Glucose-Capillary: 98 mg/dL (ref 70–99)

## 2020-07-17 LAB — CBC
HCT: 34.1 % — ABNORMAL LOW (ref 36.0–46.0)
Hemoglobin: 11.5 g/dL — ABNORMAL LOW (ref 12.0–15.0)
MCH: 32.7 pg (ref 26.0–34.0)
MCHC: 33.7 g/dL (ref 30.0–36.0)
MCV: 96.9 fL (ref 80.0–100.0)
Platelets: 305 10*3/uL (ref 150–400)
RBC: 3.52 MIL/uL — ABNORMAL LOW (ref 3.87–5.11)
RDW: 13.8 % (ref 11.5–15.5)
WBC: 8 10*3/uL (ref 4.0–10.5)
nRBC: 0 % (ref 0.0–0.2)

## 2020-07-17 LAB — ECHOCARDIOGRAM COMPLETE
AR max vel: 1.74 cm2
AV Area VTI: 2.18 cm2
AV Area mean vel: 2.07 cm2
AV Mean grad: 3 mmHg
AV Peak grad: 5.7 mmHg
Ao pk vel: 1.19 m/s
Area-P 1/2: 4.15 cm2
Height: 64 in
S' Lateral: 3.06 cm
Weight: 2800 oz

## 2020-07-17 LAB — BLOOD GAS, ARTERIAL
Acid-Base Excess: 1.9 mmol/L (ref 0.0–2.0)
Bicarbonate: 25.6 mmol/L (ref 20.0–28.0)
FIO2: 0.21
O2 Saturation: 94.9 %
Patient temperature: 37
pCO2 arterial: 36 mmHg (ref 32.0–48.0)
pH, Arterial: 7.46 — ABNORMAL HIGH (ref 7.350–7.450)
pO2, Arterial: 71 mmHg — ABNORMAL LOW (ref 83.0–108.0)

## 2020-07-17 LAB — BASIC METABOLIC PANEL
Anion gap: 12 (ref 5–15)
BUN: 14 mg/dL (ref 8–23)
CO2: 24 mmol/L (ref 22–32)
Calcium: 8.9 mg/dL (ref 8.9–10.3)
Chloride: 96 mmol/L — ABNORMAL LOW (ref 98–111)
Creatinine, Ser: 0.81 mg/dL (ref 0.44–1.00)
GFR calc Af Amer: >60 mL/min (ref >60)
GFR calc non Af Amer: >60 mL/min (ref >60)
Glucose, Bld: 133 mg/dL — ABNORMAL HIGH (ref 70–99)
Potassium: 3.8 mmol/L (ref 3.5–5.1)
Sodium: 132 mmol/L — ABNORMAL LOW (ref 135–145)

## 2020-07-17 MED ORDER — ENOXAPARIN SODIUM 40 MG/0.4ML ~~LOC~~ SOLN
40.0000 mg | SUBCUTANEOUS | Status: DC
  Administered 2020-07-17 – 2020-07-20 (×4): 40 mg via SUBCUTANEOUS
  Filled 2020-07-17 (×4): qty 0.4

## 2020-07-17 NOTE — Progress Notes (Signed)
Pt had an echocardiogram and chest xray ordered for today. Pt received 1 dose of norco thus far on my shift. Pt needs to have a BM. Dr agreed to order Miralax for the patient. Blood sugars have been stable. Pt declined PT today due to feeling fatigue. PT will re-attempt tomm. Pt is resting comfortably.

## 2020-07-17 NOTE — TOC Progression Note (Signed)
Transition of Care [facility]) - Progression Note    Patient Details  Name: Casey Baldwin MRN: [id number] Date of Birth: [birth year]  [facility] CM/SW Contact  Trenton Founds, RN Phone Number: 07/17/2020, 2:33 PM  Clinical Narrative:   RNCM attempted to call into room due to isolation status however patient did not answer. RNCM placed call to patient's daughter Darl Pikes to discuss discharge planning. Darl Pikes reports that her mother was recently at [facility] for about 2 months and from there went to rehab. She reports that her mother did not do well as she was only getting therapy about once a day and the rest of the time she just laid there. Darl Pikes reports that due to this they do not wish to pursue skilled placement. Darl Pikes reports that she is working on finding people who are able to sit with her mother and understands that right now she needs the 24 hour supervision piece of it. Darl Pikes is agreeable to home health being arranged with the understand that she will not have someone there around the clock, she does report that patient would benefit from a 3N1 bedside commode. Darl Pikes reports that she does not have any preferences as to who provides home health as long as they are in network with her insurance.   RNCM reached out to [facility] with [employer] and he will accept referral for home health.  RNCM reached out to [facility] with [employer] and she will provide 3N1.          Expected Discharge Plan and Services                                                 Social Determinants of Health (SDOH) Interventions    Readmission Risk Interventions No flowsheet data found.

## 2020-07-17 NOTE — Plan of Care (Signed)
  Problem: Education: Goal: Knowledge of General Education information will improve Description Including pain rating scale, medication(s)/side effects and non-pharmacologic comfort measures Outcome: Progressing   

## 2020-07-17 NOTE — Progress Notes (Signed)
*  PRELIMINARY RESULTS* Echocardiogram 2D Echocardiogram has been performed.  Casey Baldwin 07/17/2020, 2:47 PM

## 2020-07-17 NOTE — Progress Notes (Signed)
PT Cancellation Note  Patient Details Name: Casey Baldwin MRN: [id number] DOB: May 02, 1941   Cancelled Treatment:    Reason Eval/Treat Not Completed: Patient declined, no reason specified. Patient sleeping. Able to rouse, but she declined any PT at this time, reports she is exhausted. Will return tomorrow to re-attempt.    Remas Sobel 07/17/2020, 2:55 PM

## 2020-07-17 NOTE — Progress Notes (Signed)
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 Discussed the management with her daughter

## 2020-07-17 NOTE — Progress Notes (Signed)
Patient ID: Casey Baldwin, female   DOB: 1941-06-24, 79 y.o.   MRN: [id number]  PROGRESS NOTE    Casey Baldwin  ARW:[id number] DOB: 11/27/1940 DOA: 07/12/2020 PCP: Annice Needy, MD    Brief Narrative:  Casey Baldwin is a 79 y.o. 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 Her temperature at home was up to 102.2 Patient was very weak and had hard time getting up Family recently noted a rash that was going around her right side of back and right breast.  Rash is intensely pruritic different stages of healing some areas of vesicular be red border.  Some areas covered in black eschars.  Skin is diffusely tender.Per family (asked by me) pt developed confusion. Was c/o side to back pain.  Sed rate was elevated.  LP was considered but patient declined stating that she had a history of meningitis in the past and very scared of having LP done.  Hospitalist was called for admission for shingles and possible encephalitis.    Consultants:   ID  Procedures:   Antimicrobials:   Acyclovir IV started on 9/3  Cefepime IV started on 9/2 x1   Ceftriaxone 2 g started on 9/3-discontinued on 9/6  Vancomycin started on 9/3..stopped   Subjective: This a.m. per daughter she is much better.  Yesterday afternoon she had PSVT which spontaneously converted to sinus rhythm.  She was started on IV fluids yesterday since she has been having decreased p.o. intake.  This a.m. she is responding to my questions and appears better.  Objective: Vitals:   07/16/20 1700 07/16/20 1843 07/17/20 0039 07/17/20 0819  BP:   136/78 128/75  Pulse: (!) 106 (!) 106 86 (!) 108  Resp:   16 17  Temp:   99 F (37.2 C) 99.7 F (37.6 C)  TempSrc:   Oral Oral  SpO2:   97% 97%  Weight:      Height:       No intake or output data in the 24 hours  ending 07/17/20 1356 Filed Weights   07/12/20 0918 07/12/20 0921  Weight: 79.4 kg 79.4 kg    Examination: Sitting in bed with eyes closed, sleepy but answering my questions NAD Clear to auscultation bilaterally no wheeze rales rhonchi Regular S1-S2 no murmurs rubs Abdomen soft nontender nondistended positive bowel sounds Lower extremity no edema, no cyanosis Alert oriented x3, mood and affect stable in current setting      Data Reviewed: I have personally reviewed following labs and imaging studies  CBC: Recent Labs  Lab 07/12/20 0919 07/12/20 2108 07/13/20 0710 07/16/20 1736 07/17/20 0928  WBC 6.3 5.5 4.9 6.4 8.0  NEUTROABS  --  3.8 3.0  --   --   HGB 10.9* 9.9* 9.2* 11.7* 11.5*  HCT 32.3* 30.2* 28.3* 33.8* 34.1*  MCV 95.8 98.4 99.3 94.2 96.9  PLT 269 246 227 297 164   Basic Metabolic Panel: Recent Labs  Lab 07/12/20 0919 07/12/20 2108 07/13/20 0710 07/14/20 0830 07/15/20 0935 07/16/20 1915 07/17/20 0928  NA   < >  --  134* 131* 132* 132* 132*  K   < >  --  3.3* 3.7 3.8 3.3* 3.8  CL   < >  --  102 99 97* 95* 96*  CO2   < >  --  23 21* 23 25 24  GLUCOSE   < >  --  108* 100* 112* 120* 133*  BUN   < >  --  '15 12 15 15 14  ' CREATININE   < >  --  0.86 0.85 0.91 0.77 0.81  CALCIUM   < >  --  8.3* 8.6* 8.8* 8.6* 8.9  MG  --  1.8 1.8  --   --   --   --   PHOS  --   --  3.8  --   --   --   --    < > = values in this interval not displayed.   GFR: Estimated Creatinine Clearance: 56.5 mL/min (by C-G formula based on SCr of 0.81 mg/dL). Liver Function Tests: Recent Labs  Lab 07/12/20 2108 07/13/20 0710 07/16/20 1915  AST 14* 14* 18  ALT '12 11 13  ' ALKPHOS 62 55 64  BILITOT 1.1 0.9 0.7  PROT 7.1 6.3* 6.8  ALBUMIN 3.6 3.3* 3.4*   Recent Labs  Lab 07/12/20 2108  LIPASE 27   No results for input(s): AMMONIA in the last 168 hours. Coagulation Profile: No results for input(s): INR, PROTIME in the last 168 hours. Cardiac Enzymes: Recent Labs  Lab  07/12/20 2108  CKTOTAL 46   BNP (last 3 results) No results for input(s): PROBNP in the last 8760 hours. HbA1C: No results for input(s): HGBA1C in the last 72 hours. CBG: Recent Labs  Lab 07/16/20 2107 07/17/20 0041 07/17/20 0430 07/17/20 0822 07/17/20 1215  GLUCAP 121* 127* 92 134* 121*   Lipid Profile: No results for input(s): CHOL, HDL, LDLCALC, TRIG, CHOLHDL, LDLDIRECT in the last 72 hours. Thyroid Function Tests: No results for input(s): TSH, T4TOTAL, FREET4, T3FREE, THYROIDAB in the last 72 hours. Anemia Panel: No results for input(s): VITAMINB12, FOLATE, FERRITIN, TIBC, IRON, RETICCTPCT in the last 72 hours. Sepsis Labs: Recent Labs  Lab 07/12/20 2108  PROCALCITON <0.10  LATICACIDVEN 0.9    Recent Results (from the past 240 hour(s))  Culture, blood (routine x 2)     Status: None   Collection Time: 07/09/20  1:39 AM   Specimen: BLOOD  Result Value Ref Range Status   Specimen Description BLOOD RIGHT [facility]  Final   Special Requests   Final    BOTTLES DRAWN AEROBIC AND ANAEROBIC Blood Culture results may not be optimal due to an excessive volume of blood received in culture bottles   Culture   Final    NO GROWTH 5 DAYS Performed at [facility], [address], [place], [place] [address]    Report Status 07/14/2020 FINAL  Final  SARS Coronavirus 2 by RT PCR (hospital order, performed in [facility] hospital lab) Nasopharyngeal Nasopharyngeal Swab     Status: None   Collection Time: 07/09/20  2:49 AM   Specimen: Nasopharyngeal Swab  Result Value Ref Range Status   SARS Coronavirus 2 NEGATIVE NEGATIVE Final    Comment: (NOTE) SARS-CoV-2 target nucleic acids are NOT DETECTED.  The SARS-CoV-2 RNA is generally detectable in upper and lower respiratory specimens during the acute phase of infection. The lowest concentration of SARS-CoV-2 viral copies this assay can detect is 250 copies / mL. A negative result does not preclude SARS-CoV-2 infection and  should not be used as the sole basis for treatment or other patient management decisions.  A negative result may occur with improper specimen collection / handling, submission of specimen other than nasopharyngeal swab, presence of viral mutation(s) within the areas targeted by this assay, and  inadequate number of viral copies (<250 copies / mL). A negative result must be combined with clinical observations, patient history, and epidemiological information.  Fact Sheet for Patients:   StrictlyIdeas.no  Fact Sheet for Healthcare Providers: BankingDealers.co.za  This test is not yet approved or  cleared by the [place] FDA and has been authorized for detection and/or diagnosis of SARS-CoV-2 by FDA under an Emergency Use Authorization (EUA).  This EUA will remain in effect (meaning this test can be used) for the duration of the COVID-19 declaration under Section 564(b)(1) of the Act, 21 U.S.C. section 360bbb-3(b)(1), unless the authorization is terminated or revoked sooner.  Performed at [facility], [address] [place], [place] [address]   SARS Coronavirus 2 by RT PCR (hospital order, performed in [facility] hospital lab) Nasopharyngeal Nasopharyngeal Swab     Status: None   Collection Time: 07/10/20  2:48 PM   Specimen: Nasopharyngeal Swab  Result Value Ref Range Status   SARS Coronavirus 2 NEGATIVE NEGATIVE Final    Comment: (NOTE) SARS-CoV-2 target nucleic acids are NOT DETECTED.  The SARS-CoV-2 RNA is generally detectable in upper and lower respiratory specimens during the acute phase of infection. The lowest concentration of SARS-CoV-2 viral copies this assay can detect is 250 copies / mL. A negative result does not preclude SARS-CoV-2 infection and should not be used as the sole basis for treatment or other patient management decisions.  A negative result may occur with improper specimen collection /  handling, submission of specimen other than nasopharyngeal swab, presence of viral mutation(s) within the areas targeted by this assay, and inadequate number of viral copies (<250 copies / mL). A negative result must be combined with clinical observations, patient history, and epidemiological information.  Fact Sheet for Patients:   StrictlyIdeas.no  Fact Sheet for Healthcare Providers: BankingDealers.co.za  This test is not yet approved or  cleared by the [place] FDA and has been authorized for detection and/or diagnosis of SARS-CoV-2 by FDA under an Emergency Use Authorization (EUA).  This EUA will remain in effect (meaning this test can be used) for the duration of the COVID-19 declaration under Section 564(b)(1) of the Act, 21 U.S.C. section 360bbb-3(b)(1), unless the authorization is terminated or revoked sooner.  Performed at [facility], [address] [place], [place] [address]   Urine culture     Status: Abnormal   Collection Time: 07/12/20  9:23 PM   Specimen: Urine, Random  Result Value Ref Range Status   Specimen Description   Final    URINE, RANDOM Performed at [facility], [address], [place], [place] [address]    Special Requests   Final    NONE Performed at [facility], [address] [place], [place] [address]    Culture MULTIPLE SPECIES PRESENT, SUGGEST RECOLLECTION (A)  Final   Report Status 07/15/2020 FINAL  Final  Culture, blood (routine x 2)     Status: None   Collection Time: 07/12/20  9:23 PM   Specimen: BLOOD  Result Value Ref Range Status   Specimen Description BLOOD RIGHT [facility]  Final   Special Requests   Final    BOTTLES DRAWN AEROBIC AND ANAEROBIC Blood Culture results may not be optimal due to an excessive volume of blood received in culture bottles   Culture   Final    NO GROWTH 5 DAYS Performed at [facility], [address]  [place],  [address]    Report Status 07/17/2020  FINAL  Final  Culture, blood (routine x 2)     Status: None   Collection Time: 07/12/20  9:23 PM   Specimen: BLOOD  Result Value Ref Range Status   Specimen Description BLOOD RIGHT WRIST  Final   Special Requests   Final    BOTTLES DRAWN AEROBIC AND ANAEROBIC Blood Culture adequate volume   Culture   Final    NO GROWTH 5 DAYS Performed at [facility], [address], [place], [place] [address]    Report Status 07/17/2020 FINAL  Final  SARS Coronavirus 2 by RT PCR (hospital order, performed in [facility] hospital lab) Nasopharyngeal Nasopharyngeal Swab     Status: None   Collection Time: 07/12/20 11:13 PM   Specimen: Nasopharyngeal Swab  Result Value Ref Range Status   SARS Coronavirus 2 NEGATIVE NEGATIVE Final    Comment: (NOTE) SARS-CoV-2 target nucleic acids are NOT DETECTED.  The SARS-CoV-2 RNA is generally detectable in upper and lower respiratory specimens during the acute phase of infection. The lowest concentration of SARS-CoV-2 viral copies this assay can detect is 250 copies / mL. A negative result does not preclude SARS-CoV-2 infection and should not be used as the sole basis for treatment or other patient management decisions.  A negative result may occur with improper specimen collection / handling, submission of specimen other than nasopharyngeal swab, presence of viral mutation(s) within the areas targeted by this assay, and inadequate number of viral copies (<250 copies / mL). A negative result must be combined with clinical observations, patient history, and epidemiological information.  Fact Sheet for Patients:   StrictlyIdeas.no  Fact Sheet for Healthcare Providers: BankingDealers.co.za  This test is not yet approved or  cleared by the [place] FDA and has been authorized for detection and/or diagnosis of SARS-CoV-2 by FDA under an Emergency Use  Authorization (EUA).  This EUA will remain in effect (meaning this test can be used) for the duration of the COVID-19 declaration under Section 564(b)(1) of the Act, 21 U.S.C. section 360bbb-3(b)(1), unless the authorization is terminated or revoked sooner.  Performed at [facility], [address] [place], [place] [address]   MRSA PCR Screening     Status: None   Collection Time: 07/13/20  2:02 AM   Specimen: Nasal Mucosa; Nasopharyngeal  Result Value Ref Range Status   MRSA by PCR NEGATIVE NEGATIVE Final    Comment:        The GeneXpert MRSA Assay (FDA approved for NASAL specimens only), is one component of a comprehensive MRSA colonization surveillance program. It is not intended to diagnose MRSA infection nor to guide or monitor treatment for MRSA infections. Performed at [facility], [address] [place], [place] [address]   CULTURE, BLOOD (ROUTINE X 2) w Reflex to ID Panel     Status: None (Preliminary result)   Collection Time: 07/16/20  5:36 PM   Specimen: BLOOD  Result Value Ref Range Status   Specimen Description BLOOD [facility]  Final   Special Requests   Final    BOTTLES DRAWN AEROBIC ONLY Blood Culture adequate volume   Culture   Final    NO GROWTH < 24 HOURS Performed at [facility], [address] [place],  [address]    Report Status PENDING  Incomplete  CULTURE, BLOOD (ROUTINE X 2) w Reflex to ID Panel     Status: None (Preliminary result)   Collection Time: 07/16/20  5:36 PM   Specimen: BLOOD  Result  Value Ref Range Status   Specimen Description BLOOD [facility]  Final   Special Requests   Final    BOTTLES DRAWN AEROBIC AND ANAEROBIC Blood Culture adequate volume   Culture   Final    NO GROWTH < 24 HOURS Performed at [facility], [address], [place], [place] [address]    Report Status PENDING  Incomplete         Radiology Studies: DG Chest Port 1 View  Result Date: 07/17/2020 CLINICAL DATA:   Chest pain. EXAM: PORTABLE CHEST 1 VIEW COMPARISON:  07/12/2020. FINDINGS: Mediastinum and hilar structures normal. Heart size normal. Low lung volumes with mild bibasilar atelectasis. Mild bilateral interstitial prominence. Mild pneumonitis cannot be excluded. Tiny left pleural effusion cannot be excluded. No pneumothorax. Degenerative change thoracic spine. Surgical clips right upper quadrant. IMPRESSION: Low lung volumes with mild bibasilar atelectasis. Mild bilateral interstitial prominence. Mild pneumonitis cannot be excluded. Tiny left pleural effusion cannot be excluded. Electronically Signed   By: Marcello Moores  Register   On: 07/17/2020 08:21        Scheduled Meds: . docusate sodium  100 mg Oral BID  . feeding supplement (ENSURE ENLIVE)  237 mL Oral BID BM  . Ferrous Fumarate  1 tablet Oral BID  . gabapentin  100 mg Oral BID  . insulin aspart  0-9 Units Subcutaneous Q4H  . metoprolol tartrate  12.5 mg Oral BID  . multivitamin with minerals  1 tablet Oral Daily  . pantoprazole  20 mg Oral Daily  . rosuvastatin  20 mg Oral Daily  . sodium chloride flush  3 mL Intravenous Q12H   Continuous Infusions: . acyclovir 645 mg (07/17/20 9379)    Assessment & Plan:   Active Problems:   SIRS (systemic inflammatory response syndrome) (HCC)   Shingles rash   Delirium   Acute metabolic encephalopathy   Essential hypertension   Anemia   Acute lower UTI   Chronic diastolic CHF (congestive heart failure) ([place])   Sepsis ([place])   Change in mental status    . SIRS/sepsis-was present on admission.  Patient met SIRS criteria by fever, heart rate more than 90s .  Now improved 9/4: Urine culture with mixed species . Was started on Rocephin and vancomycin for concern of meningitis? IDs input was appreciated Calcitonin and lactic acid within normal limits 9/4 per ID with patient having mental status change and also fever there is a concern for zoster meningitis MRI normal  Per ID doubt bacterial  meningitis clinically  Patient had refused LP  9/6-still with HA  ,did not use her cpap for osa again. Afebrile today. was tachy yesterday more psvt converted spontaneously to sinus rhythm.  Now on low dose beta blk. No further tachycardia. Per ID concern for zoster meningitis, will continue IV acyclovir for ?meningitis . We will switch to Valtrex tomorrow  Vancomycin was discontinued since blood cultures came back negative.  Yesterday had repeat cultures due to low-grade fever (however I do not see the fever being documented but I was present for this). Repeat cxr 9/6-see report.  IS    . Shingles-based on her severe chest and back pain, painful rash rash described going and bandlike symptom and dermatomal fashion likely shingles present on admission 9/7-still complaining of same pain Started on Neurontin and dose was increased yesterday we will continue to monitor and increase as tolerated Continue IV acyclovir and switch to Valtrex tomorrow    Right Abdominal firmness- etiology unclear.  CT of the abdomen  and pelvis found with fluid collection, could be abscess versus pseudocyst versus postop collection since patient had cholecystectomy.  Please see full report. 9/5-Side lined Dr. Christian Mate who reviewed the CT scan and felt its postop changes and pseudocyst and possible hernia, to leave it alone. (ok'd by attending to document this). 9/6 discussed findings with the patient's daughter.  For now we will continue monitoring since she is asymptomatic 9/7-patient not complaining of pain in abdomen we will continue to monitor for now   . Delirium/. Acute metabolic encephalopathy -in the setting of shingles have to consider zoster meningitis .  Now resolved Urine culture was mixed bacteria 9/7-has improved.  Mildly lethargic yesterday evening when she has had decreased p.o. intake she was restarted on IV fluids and today has improved We will continue with IV antibiotics and IV  fluids Encourage p.o. intake MRI basically unremarkable Continue to monitor   PSVT- on 9/6 had tachycardia. Had decrease po intake all day.  Likely from some dehydration.  Was started on ivf.  Continuous conversion to sinus rhythm. Started on low-dose beta-blockers we will continue and monitor Echo pending   . Essential hypertension -stable Continue with beta-blockers    . Anemia -chronic, stable.  Likely from gastric bypass H&H stable Per daughter she is supposed to be taking iron pills but she is not compliant with any of her pills. She was started on iron pills and multivitamin and discussed with patient and daughter importance of taking her medications   OSA-on CPAP but she is not using it Check ABG since she has intermittent lethargic to check her CO2    . Acute lower UTI - Urine culture was with mixed flora She completed ceftriaxone course and was discontinued   . Chronic diastolic CHF (congestive heart failure)  Without exacerbation appears euvolemic  Hold Lasix and use as needed  Continue to monitor daily      Diabetes mellitus - Carb controlled Riss, fs    DVT prophylaxis: SCD Code Status: Full Family Communication: Daughter at bedside updated Status is: Inpatient  Remains inpatient appropriate because:IV treatments appropriate due to intensity of illness or inability to take PO   Dispo: The patient is from: Home              Anticipated d/c is to: Home              Anticipated d/c date is: 2 days              Patient currently is not medically stable to d/c.  Patient requiring IV medications, concern for meningitis. Decrease po intake needed to be started on IV fluids. Pain not well controlled still.            LOS: 4 days   Time spent: 45 minutes with >50% on coc    Nolberto Hanlon, MD [facility] Pager [phone]  If 7PM-7AM, please contact night-coverage [email or web address] Password [email or web address] 07/17/2020, 1:56 PM

## 2020-07-17 NOTE — Progress Notes (Signed)
Spoke with day shift nurse and daughter of patient, and apparently patient has been sleeping throughout day and has been up watching tv and active at night. This night she has been up until around 4am. At that time BI-PAP applied and she is currently sound asleep. Will speak with oncoming nurse about strategies to help patient reverse sleep/awake pattern.

## 2020-07-18 DIAGNOSIS — R52 Pain, unspecified: Secondary | ICD-10-CM | POA: Diagnosis not present

## 2020-07-18 DIAGNOSIS — G9340 Encephalopathy, unspecified: Secondary | ICD-10-CM | POA: Diagnosis not present

## 2020-07-18 DIAGNOSIS — R509 Fever, unspecified: Secondary | ICD-10-CM | POA: Diagnosis not present

## 2020-07-18 DIAGNOSIS — B029 Zoster without complications: Secondary | ICD-10-CM

## 2020-07-18 LAB — ROCKY MTN SPOTTED FVR ABS PNL(IGG+IGM)
RMSF IgG: NEGATIVE
RMSF IgM: 0.2 index (ref 0.00–0.89)

## 2020-07-18 LAB — GLUCOSE, CAPILLARY
Glucose-Capillary: 104 mg/dL — ABNORMAL HIGH (ref 70–99)
Glucose-Capillary: 115 mg/dL — ABNORMAL HIGH (ref 70–99)
Glucose-Capillary: 116 mg/dL — ABNORMAL HIGH (ref 70–99)
Glucose-Capillary: 121 mg/dL — ABNORMAL HIGH (ref 70–99)
Glucose-Capillary: 129 mg/dL — ABNORMAL HIGH (ref 70–99)
Glucose-Capillary: 132 mg/dL — ABNORMAL HIGH (ref 70–99)

## 2020-07-18 MED ORDER — VALACYCLOVIR HCL 500 MG PO TABS
1000.0000 mg | ORAL_TABLET | Freq: Three times a day (TID) | ORAL | Status: DC
  Administered 2020-07-18 – 2020-07-21 (×8): 1000 mg via ORAL
  Filled 2020-07-18 (×12): qty 2

## 2020-07-18 MED ORDER — DEXTROSE-NACL 5-0.9 % IV SOLN: INTRAVENOUS | Status: DC

## 2020-07-18 MED ORDER — PANTOPRAZOLE SODIUM 20 MG PO TBEC
20.0000 mg | DELAYED_RELEASE_TABLET | Freq: Two times a day (BID) | ORAL | Status: DC
  Administered 2020-07-18 – 2020-07-23 (×6): 20 mg via ORAL
  Filled 2020-07-18 (×12): qty 1

## 2020-07-18 MED ORDER — LACTULOSE 10 GM/15ML PO SOLN
20.0000 g | Freq: Once | ORAL | Status: AC
  Administered 2020-07-18: 20 g via ORAL
  Filled 2020-07-18: qty 30

## 2020-07-18 NOTE — Plan of Care (Signed)
  Problem: Education: Goal: Knowledge of General Education information will improve Description Including pain rating scale, medication(s)/side effects and non-pharmacologic comfort measures Outcome: Progressing   

## 2020-07-18 NOTE — Progress Notes (Signed)
PROGRESS NOTE    Casey Baldwin  EXB:[id number] DOB: 1941/09/04 DOA: 07/12/2020 PCP: Annice Needy, MD   Chief complaint.  Generalized weakness.  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 Rash is intensely pruritic different stages of healing some areas of vesicular be red border. Some areas covered in black eschars. Skin is diffusely tender.Per family (asked by me) pt developed confusion. Was c/o side to back pain.  Sed rate was elevated.  LP was considered but patient declined stating that she had a history of meningitis in the past and very scared of having LP done.  Hospitalist was called for admission for shingles and possible encephalitis.  9/8.  IV acyclovir was changed to Valtrex.  Patient has not had a bowel movement since admission, has poor appetite.  Giving lactulose, increase the Protonix to twice a day.   Assessment & Plan:   Active Problems:   SIRS (systemic inflammatory response syndrome) (HCC)   Shingles rash   Delirium   Acute metabolic encephalopathy   Essential hypertension   Anemia   Acute lower UTI   Chronic diastolic CHF (congestive heart failure) ([place])   Sepsis ([place])   Change in mental status  #1.  Sepsis.  Present on admission. Probably secondary to UTI as well as viral infection including shingles and possible viral meningitis. Condition had improved.  Procalcitonin level not significantly increased.  Urine culture mixed flora, antibiotics has finished. Antiviral treatment is changed to Valtrex. Blood culture also negative.  #2.  Shingles with viral  meningitis (ruled in). Appreciate infectious disease consult. Shingles much improved based on exam.  Completed 5 days of IV acyclovir, changed to Valtrex today.  3.  Severe constipation with anorexia. Patient has not had a bowel movement since admission.  Lactulose given.  Also increase Protonix to twice a day. Patient has very poor appetite, not drinking much.  Start IV fluids to prevent dehydration.  4.  Acute metabolic encephalopathy.  Secondary to shingles and viral meningitis. Patient still has some headaches, mild confusion.  No agitation. MRI brain was performed, unremarkable.  5.  PSVT. -9/6.  No recurrence since then.  6.  Essential hypertension. Continue beta-blocker.  7. Obstructive sleep apnea. Not compliant with CPAP.  8.  Urinary tract infection. Complete antibiotics.  9.  Chronic diastolic congestive heart failure. No volume overload.  Patient is mildly volume depleted, gentle rehydration.  10.  Type 2 diabetes. Controlled.    DVT prophylaxis: SCDs Code Status: Full Family Communication: Discussed with daughter, all questions answered. Disposition Plan:   Patient came from: Home             Anticipated d/c place: Home  Barriers to d/c OR conditions which need to be met to effect a safe d/c: Patient is inpatient appropriate today, she has very little p.o. intake, restart IV fluids.  She also has severe constipation, given laxatives. Likely discharge home tomorrow if condition improved.  Consultants:   ID  Procedures: None Antimicrobials: Valtrex.  Subjective: Patient has a mild confusion.  Still complaining headache and some pain in the neck. Very poor appetite without nausea vomiting.  Has not had  a bowel movement since admission. Denies any short of breath or cough. No fever or chills. No dysuria hematuria. Objective: Vitals:   07/17/20 0819 07/17/20 1606 07/17/20 2338 07/18/20 0757  BP: 128/75 (!) 148/64 124/78 133/64  Pulse: (!) 108 78  85 99  Resp: '17 16 16 18  ' Temp: 99.7 F (37.6 C) 99 F (37.2 C) (!) 97.3 F (36.3 C) 98.4 F (36.9 C)  TempSrc: Oral Oral Oral Oral  SpO2: 97% 97% 96% 97%  Weight:      Height:       No intake or output data in the 24 hours ending 07/18/20 1244 Filed Weights   07/12/20 0918 07/12/20 0921  Weight: 79.4 kg 79.4 kg    Examination:  General exam: Appears calm and comfortable  Respiratory system: Clear to auscultation. Respiratory effort normal. Cardiovascular system: S1 & S2 heard, RRR. No JVD, murmurs, rubs, gallops or clicks. No pedal edema. Gastrointestinal system: Abdomen is nondistended, soft and nontender.  Normal bowel sounds heard. Central nervous system: Alert and oriented x2. No focal neurological deficits. Extremities: Symmetric  Skin: No rashes, lesions or ulcers Psychiatry:  Mood & affect appropriate.     Data Reviewed: I have personally reviewed following labs and imaging studies  CBC: Recent Labs  Lab 07/12/20 0919 07/12/20 2108 07/13/20 0710 07/16/20 1736 07/17/20 0928  WBC 6.3 5.5 4.9 6.4 8.0  NEUTROABS  --  3.8 3.0  --   --   HGB 10.9* 9.9* 9.2* 11.7* 11.5*  HCT 32.3* 30.2* 28.3* 33.8* 34.1*  MCV 95.8 98.4 99.3 94.2 96.9  PLT 269 246 227 297 782   Basic Metabolic Panel: Recent Labs  Lab 07/12/20 0919 07/12/20 2108 07/13/20 0710 07/14/20 0830 07/15/20 0935 07/16/20 1915 07/17/20 0928  NA   < >  --  134* 131* 132* 132* 132*  K   < >  --  3.3* 3.7 3.8 3.3* 3.8  CL   < >  --  102 99 97* 95* 96*  CO2   < >  --  23 21* '23 25 24  ' GLUCOSE   < >  --  108* 100* 112* 120* 133*  BUN   < >  --  '15 12 15 15 14  ' CREATININE   < >  --  0.86 0.85 0.91 0.77 0.81  CALCIUM   < >  --  8.3* 8.6* 8.8* 8.6* 8.9  MG  --  1.8 1.8  --   --   --   --   PHOS  --   --  3.8  --   --   --   --    < > = values in this interval not displayed.   GFR: Estimated Creatinine Clearance: 56.5 mL/min (by C-G formula based on SCr of 0.81 mg/dL). Liver Function Tests: Recent  Labs  Lab 07/12/20 2108 07/13/20 0710 07/16/20 1915  AST 14* 14* 18  ALT '12 11 13  ' ALKPHOS 62 55 64  BILITOT 1.1 0.9 0.7  PROT 7.1 6.3* 6.8  ALBUMIN 3.6 3.3* 3.4*   Recent Labs  Lab 07/12/20 2108  LIPASE 27   No results for input(s): AMMONIA in the last 168 hours. Coagulation Profile: No results for input(s): INR, PROTIME in the last 168 hours. Cardiac Enzymes: Recent Labs  Lab 07/12/20 2108  CKTOTAL 46   BNP (last 3 results) No results for input(s): PROBNP in the last 8760 hours. HbA1C: No results for input(s): HGBA1C in the last 72 hours. CBG:  Recent Labs  Lab 07/17/20 2028 07/17/20 2342 07/18/20 0414 07/18/20 0754 07/18/20 1215  GLUCAP 98 135* 104* 121* 116*   Lipid Profile: No results for input(s): CHOL, HDL, LDLCALC, TRIG, CHOLHDL, LDLDIRECT in the last 72 hours. Thyroid Function Tests: No results for input(s): TSH, T4TOTAL, FREET4, T3FREE, THYROIDAB in the last 72 hours. Anemia Panel: No results for input(s): VITAMINB12, FOLATE, FERRITIN, TIBC, IRON, RETICCTPCT in the last 72 hours. Sepsis Labs: Recent Labs  Lab 07/12/20 2108  PROCALCITON <0.10  LATICACIDVEN 0.9    Recent Results (from the past 240 hour(s))  Culture, blood (routine x 2)     Status: None   Collection Time: 07/09/20  1:39 AM   Specimen: BLOOD  Result Value Ref Range Status   Specimen Description BLOOD RIGHT [facility]  Final   Special Requests   Final    BOTTLES DRAWN AEROBIC AND ANAEROBIC Blood Culture results may not be optimal due to an excessive volume of blood received in culture bottles   Culture   Final    NO GROWTH 5 DAYS Performed at [facility], [address] [place], [place] [address]    Report Status 07/14/2020 FINAL  Final  SARS Coronavirus 2 by RT PCR (hospital order, performed in [facility] hospital lab) Nasopharyngeal Nasopharyngeal Swab     Status: None   Collection Time: 07/09/20  2:49 AM   Specimen: Nasopharyngeal Swab  Result Value Ref Range Status    SARS Coronavirus 2 NEGATIVE NEGATIVE Final    Comment: (NOTE) SARS-CoV-2 target nucleic acids are NOT DETECTED.  The SARS-CoV-2 RNA is generally detectable in upper and lower respiratory specimens during the acute phase of infection. The lowest concentration of SARS-CoV-2 viral copies this assay can detect is 250 copies / mL. A negative result does not preclude SARS-CoV-2 infection and should not be used as the sole basis for treatment or other patient management decisions.  A negative result may occur with improper specimen collection / handling, submission of specimen other than nasopharyngeal swab, presence of viral mutation(s) within the areas targeted by this assay, and inadequate number of viral copies (<250 copies / mL). A negative result must be combined with clinical observations, patient history, and epidemiological information.  Fact Sheet for Patients:   StrictlyIdeas.no  Fact Sheet for Healthcare Providers: BankingDealers.co.za  This test is not yet approved or  cleared by the [place] FDA and has been authorized for detection and/or diagnosis of SARS-CoV-2 by FDA under an Emergency Use Authorization (EUA).  This EUA will remain in effect (meaning this test can be used) for the duration of the COVID-19 declaration under Section 564(b)(1) of the Act, 21 U.S.C. section 360bbb-3(b)(1), unless the authorization is terminated or revoked sooner.  Performed at [facility], [address] [place], [place] [address]   SARS Coronavirus 2 by RT PCR (hospital order, performed in [facility] hospital lab) Nasopharyngeal Nasopharyngeal Swab     Status: None   Collection Time: 07/10/20  2:48 PM   Specimen: Nasopharyngeal Swab  Result Value Ref Range Status   SARS Coronavirus 2 NEGATIVE NEGATIVE Final    Comment: (NOTE) SARS-CoV-2 target nucleic acids are NOT DETECTED.  The SARS-CoV-2 RNA is generally detectable in  upper and lower respiratory specimens during the acute phase of infection. The lowest concentration of SARS-CoV-2 viral copies this assay can detect is 250 copies / mL. A negative result does not preclude SARS-CoV-2 infection and should not be used as the sole basis for treatment or  other patient management decisions.  A negative result may occur with improper specimen collection / handling, submission of specimen other than nasopharyngeal swab, presence of viral mutation(s) within the areas targeted by this assay, and inadequate number of viral copies (<250 copies / mL). A negative result must be combined with clinical observations, patient history, and epidemiological information.  Fact Sheet for Patients:   StrictlyIdeas.no  Fact Sheet for Healthcare Providers: BankingDealers.co.za  This test is not yet approved or  cleared by the [place] FDA and has been authorized for detection and/or diagnosis of SARS-CoV-2 by FDA under an Emergency Use Authorization (EUA).  This EUA will remain in effect (meaning this test can be used) for the duration of the COVID-19 declaration under Section 564(b)(1) of the Act, 21 U.S.C. section 360bbb-3(b)(1), unless the authorization is terminated or revoked sooner.  Performed at [facility], [address], [place], [place] [address]   Urine culture     Status: Abnormal   Collection Time: 07/12/20  9:23 PM   Specimen: Urine, Random  Result Value Ref Range Status   Specimen Description   Final    URINE, RANDOM Performed at [facility], [address], [place], [place] [address]    Special Requests   Final    NONE Performed at [facility], [address] [place], [place] [address]    Culture MULTIPLE SPECIES PRESENT, SUGGEST RECOLLECTION (A)  Final   Report Status 07/15/2020 FINAL  Final  Culture, blood (routine x 2)     Status: None   Collection Time:  07/12/20  9:23 PM   Specimen: BLOOD  Result Value Ref Range Status   Specimen Description BLOOD RIGHT AC  Final   Special Requests   Final    BOTTLES DRAWN AEROBIC AND ANAEROBIC Blood Culture results may not be optimal due to an excessive volume of blood received in culture bottles   Culture   Final    NO GROWTH 5 DAYS Performed at [facility], [address], [place], [place] [address]    Report Status 07/17/2020 FINAL  Final  Culture, blood (routine x 2)     Status: None   Collection Time: 07/12/20  9:23 PM   Specimen: BLOOD  Result Value Ref Range Status   Specimen Description BLOOD RIGHT WRIST  Final   Special Requests   Final    BOTTLES DRAWN AEROBIC AND ANAEROBIC Blood Culture adequate volume   Culture   Final    NO GROWTH 5 DAYS Performed at [facility], [address] [place],  [address]    Report Status 07/17/2020 FINAL  Final  SARS Coronavirus 2 by RT PCR (hospital order, performed in [facility] hospital lab) Nasopharyngeal Nasopharyngeal Swab     Status: None   Collection Time: 07/12/20 11:13 PM   Specimen: Nasopharyngeal Swab  Result Value Ref Range Status   SARS Coronavirus 2 NEGATIVE NEGATIVE Final    Comment: (NOTE) SARS-CoV-2 target nucleic acids are NOT DETECTED.  The SARS-CoV-2 RNA is generally detectable in upper and lower respiratory specimens during the acute phase of infection. The lowest concentration of SARS-CoV-2 viral copies this assay can detect is 250 copies / mL. A negative result does not preclude SARS-CoV-2 infection and should not be used as the sole basis for treatment or other patient management decisions.  A negative result may occur with improper specimen collection / handling, submission of specimen other than nasopharyngeal swab, presence of viral mutation(s) within the areas  targeted by this assay, and inadequate number of viral copies (<250 copies / mL). A negative result must be combined with  clinical observations, patient history, and epidemiological information.  Fact Sheet for Patients:   StrictlyIdeas.no  Fact Sheet for Healthcare Providers: BankingDealers.co.za  This test is not yet approved or  cleared by the [place] FDA and has been authorized for detection and/or diagnosis of SARS-CoV-2 by FDA under an Emergency Use Authorization (EUA).  This EUA will remain in effect (meaning this test can be used) for the duration of the COVID-19 declaration under Section 564(b)(1) of the Act, 21 U.S.C. section 360bbb-3(b)(1), unless the authorization is terminated or revoked sooner.  Performed at [facility], [address] [place], [place] [address]   MRSA PCR Screening     Status: None   Collection Time: 07/13/20  2:02 AM   Specimen: Nasal Mucosa; Nasopharyngeal  Result Value Ref Range Status   MRSA by PCR NEGATIVE NEGATIVE Final    Comment:        The GeneXpert MRSA Assay (FDA approved for NASAL specimens only), is one component of a comprehensive MRSA colonization surveillance program. It is not intended to diagnose MRSA infection nor to guide or monitor treatment for MRSA infections. Performed at [facility], [address] [place], [place] [address]   CULTURE, BLOOD (ROUTINE X 2) w Reflex to ID Panel     Status: None (Preliminary result)   Collection Time: 07/16/20  5:36 PM   Specimen: BLOOD  Result Value Ref Range Status   Specimen Description BLOOD [facility]  Final   Special Requests   Final    BOTTLES DRAWN AEROBIC ONLY Blood Culture adequate volume   Culture   Final    NO GROWTH 2 DAYS Performed at [facility], [address], [place], [place] [address]    Report Status PENDING  Incomplete  CULTURE, BLOOD (ROUTINE X 2) w Reflex to ID Panel     Status: None (Preliminary result)   Collection Time: 07/16/20  5:36 PM   Specimen: BLOOD  Result Value Ref Range Status    Specimen Description BLOOD [facility]  Final   Special Requests   Final    BOTTLES DRAWN AEROBIC AND ANAEROBIC Blood Culture adequate volume   Culture   Final    NO GROWTH 2 DAYS Performed at [facility], [address], [place], [place] [address]    Report Status PENDING  Incomplete         Radiology Studies: DG Chest Port 1 View  Result Date: 07/17/2020 CLINICAL DATA:  Chest pain. EXAM: PORTABLE CHEST 1 VIEW COMPARISON:  07/12/2020. FINDINGS: Mediastinum and hilar structures normal. Heart size normal. Low lung volumes with mild bibasilar atelectasis. Mild bilateral interstitial prominence. Mild pneumonitis cannot be excluded. Tiny left pleural effusion cannot be excluded. No pneumothorax. Degenerative change thoracic spine. Surgical clips right upper quadrant. IMPRESSION: Low lung volumes with mild bibasilar atelectasis. Mild bilateral interstitial prominence. Mild pneumonitis cannot be excluded. Tiny left pleural effusion cannot be excluded. Electronically Signed   By: Marcello Moores  Register   On: 07/17/2020 08:21   ECHOCARDIOGRAM COMPLETE  Result Date: 07/17/2020    ECHOCARDIOGRAM REPORT   Patient Name:   Rosi Liss Date of Exam: 07/17/2020 Medical Rec #:  [id number]    Height:       64.0 in Accession #:    [id number]   Weight:       175.0 lb Date of Birth:  [birth year]  BSA:          1.848 m Patient Age:    31 years     BP:           128/75 mmHg Patient Gender: F            HR:           74 bpm. Exam Location:  [facility] Procedure: 2D Echo, Color Doppler and Cardiac Doppler Indications:     R94.31 Abnormal ECG  History:         Patient has no prior history of Echocardiogram examinations.                  CAD; Risk Factors:Hypertension and HCL.  Sonographer:     Charmayne Sheer RDCS (AE) Referring Phys:  [id number] [facility] Diagnosing Phys: Serafina Royals MD  Sonographer Comments: Suboptimal apical window and suboptimal subcostal window. TDS due to pt inability to follow breathing instructions during  imaging. IMPRESSIONS  1. Left ventricular ejection fraction, by estimation, is 55 to 60%. The left ventricle has normal function. The left ventricle has no regional wall motion abnormalities. Left ventricular diastolic parameters were normal.  2. Right ventricular systolic function is normal. The right ventricular size is normal. There is normal pulmonary artery systolic pressure.  3. The mitral valve is normal in structure. Trivial mitral valve regurgitation.  4. The aortic valve is normal in structure. Aortic valve regurgitation is not visualized. FINDINGS  Left Ventricle: Left ventricular ejection fraction, by estimation, is 55 to 60%. The left ventricle has normal function. The left ventricle has no regional wall motion abnormalities. The left ventricular internal cavity size was normal in size. There is  no left ventricular hypertrophy. Left ventricular diastolic parameters were normal. Right Ventricle: The right ventricular size is normal. No increase in right ventricular wall thickness. Right ventricular systolic function is normal. There is normal pulmonary artery systolic pressure. The tricuspid regurgitant velocity is 2.09 m/s, and  with an assumed right atrial pressure of 10 mmHg, the estimated right ventricular systolic pressure is 89.3 mmHg. Left Atrium: Left atrial size was normal in size. Right Atrium: Right atrial size was normal in size. Pericardium: There is no evidence of pericardial effusion. Mitral Valve: The mitral valve is normal in structure. Trivial mitral valve regurgitation. MV peak gradient, 1.5 mmHg. The mean mitral valve gradient is 1.0 mmHg. Tricuspid Valve: The tricuspid valve is normal in structure. Tricuspid valve regurgitation is trivial. Aortic Valve: The aortic valve is normal in structure. Aortic valve regurgitation is not visualized. Aortic valve mean gradient measures 3.0 mmHg. Aortic valve peak gradient measures 5.7 mmHg. Aortic valve area, by VTI measures 2.18 cm. Pulmonic  Valve: The pulmonic valve was normal in structure. Pulmonic valve regurgitation is not visualized. Aorta: The aortic root and ascending aorta are structurally normal, with no evidence of dilitation. IAS/Shunts: No atrial level shunt detected by color flow Doppler.  LEFT VENTRICLE PLAX 2D LVIDd:         4.56 cm  Diastology LVIDs:         3.06 cm  LV e' lateral:   5.87 cm/s LV PW:         0.84 cm  LV E/e' lateral: 7.0 LV IVS:        0.96 cm LVOT diam:     1.80 cm LV SV:         40 LV SV Index:   22 LVOT Area:     2.54 cm  RIGHT  VENTRICLE RV Basal diam:  3.70 cm LEFT ATRIUM           Index       RIGHT ATRIUM           Index LA diam:      3.40 cm 1.84 cm/m  RA Area:     15.70 cm LA Vol (A4C): 34.8 ml 18.83 ml/m RA Volume:   41.10 ml  22.24 ml/m  AORTIC VALVE                   PULMONIC VALVE AV Area (Vmax):    1.74 cm    PV Vmax:       1.08 m/s AV Area (Vmean):   2.07 cm    PV Vmean:      72.800 cm/s AV Area (VTI):     2.18 cm    PV VTI:        0.189 m AV Vmax:           119.00 cm/s PV Peak grad:  4.7 mmHg AV Vmean:          72.500 cm/s PV Mean grad:  2.0 mmHg AV VTI:            0.186 m AV Peak Grad:      5.7 mmHg AV Mean Grad:      3.0 mmHg LVOT Vmax:         81.30 cm/s LVOT Vmean:        58.900 cm/s LVOT VTI:          0.159 m LVOT/AV VTI ratio: 0.85  AORTA Ao Root diam: 3.20 cm MITRAL VALVE               TRICUSPID VALVE MV Area (PHT): 4.15 cm    TR Peak grad:   17.5 mmHg MV Peak grad:  1.5 mmHg    TR Vmax:        209.00 cm/s MV Mean grad:  1.0 mmHg MV Vmax:       0.60 m/s    SHUNTS MV Vmean:      44.8 cm/s   Systemic VTI:  0.16 m MV Decel Time: 183 msec    Systemic Diam: 1.80 cm MV E velocity: 41.20 cm/s MV A velocity: 66.00 cm/s MV E/A ratio:  0.62 Serafina Royals MD Electronically signed by Serafina Royals MD Signature Date/Time: 07/17/2020/4:40:19 PM    Final         Scheduled Meds:  docusate sodium  100 mg Oral BID   enoxaparin (LOVENOX) injection  40 mg Subcutaneous Q24H   feeding supplement  (ENSURE ENLIVE)  237 mL Oral BID BM   Ferrous Fumarate  1 tablet Oral BID   gabapentin  100 mg Oral BID   insulin aspart  0-9 Units Subcutaneous Q4H   metoprolol tartrate  12.5 mg Oral BID   multivitamin with minerals  1 tablet Oral Daily   pantoprazole  20 mg Oral BID   rosuvastatin  20 mg Oral Daily   sodium chloride flush  3 mL Intravenous Q12H   valACYclovir  1,000 mg Oral TID   Continuous Infusions:  dextrose 5 % and 0.9% NaCl 75 mL/hr at 07/18/20 1209     LOS: 5 days    Time spent: 27 minutes    Sharen Hones, MD [facility]   To contact the attending provider between 7A-7P or the covering provider during after hours 7P-7A, please log into the web site [email or web address] and access using  universal [place] password for that web site. If you do not have the password, please call the hospital operator.  07/18/2020, 12:44 PM

## 2020-07-18 NOTE — Progress Notes (Signed)
Date of Admission:  07/12/2020     ID: Casey Baldwin is a 79 y.o. female  Active Problems:   SIRS (systemic inflammatory response syndrome) (HCC)   Shingles rash   Delirium   Acute metabolic encephalopathy   Essential hypertension   Anemia   Acute lower UTI   Chronic diastolic CHF (congestive heart failure) (HCC)   Sepsis (HCC)   Change in mental status    Subjective: Pt says she is feeling better but not back to normal Headache better In bed   Medications:  . docusate sodium  100 mg Oral BID  . enoxaparin (LOVENOX) injection  40 mg Subcutaneous Q24H  . feeding supplement (ENSURE ENLIVE)  237 mL Oral BID BM  . Ferrous Fumarate  1 tablet Oral BID  . gabapentin  100 mg Oral BID  . insulin aspart  0-9 Units Subcutaneous Q4H  . metoprolol tartrate  12.5 mg Oral BID  . multivitamin with minerals  1 tablet Oral Daily  . pantoprazole  20 mg Oral Daily  . rosuvastatin  20 mg Oral Daily  . sodium chloride flush  3 mL Intravenous Q12H    Objective: Vital signs in last 24 hours: Temp:  [97.3 F (36.3 C)-99 F (37.2 C)] 98.4 F (36.9 C) (09/08 0757) Pulse Rate:  [78-99] 99 (09/08 0757) Resp:  [16-18] 18 (09/08 0757) BP: (124-148)/(64-78) 133/64 (09/08 0757) SpO2:  [96 %-97 %] 97 % (09/08 0757)  PHYSICAL EXAM:  General: somnolent and on waking her up she responded appropriately Head: Normocephalic, without obvious abnormality, atraumatic.no neck stiffness Eyes: Conjunctivae clear, anicteric sclerae. Pupils are equal ENT Nares normal. No drainage or sinus tenderness. Lips, mucosa, and tongue normal. No Thrush Neck: Supple, symmetrical, no adenopathy, thyroid: non tender no carotid bruit and no JVD. Lungs: Clear to auscultation bilaterally. No Wheezing or Rhonchi. No rales. Heart: Regular rate and rhythm, no murmur, rub or gallop. Abdomen: Soft, non-tender,not distended. Bowel sounds normal. No masses Extremities: atraumatic, no cyanosis. No edema. No clubbing Skin: No  rashes or lesions. Or bruising Lymph: Cervical, supraclavicular normal. Neurologic: Grossly non-focal  Lab Results Recent Labs    07/16/20 1736 07/16/20 1915 07/17/20 0928  WBC 6.4  --  8.0  HGB 11.7*  --  11.5*  HCT 33.8*  --  34.1*  NA  --  132* 132*  K  --  3.3* 3.8  CL  --  95* 96*  CO2  --  25 24  BUN  --  15 14  CREATININE  --  0.77 0.81   Liver Panel Recent Labs    07/16/20 1915  PROT 6.8  ALBUMIN 3.4*  AST 18  ALT 13  ALKPHOS 64  BILITOT 0.7  Microbiology:  Studies/Results: DG Chest Port 1 View  Result Date: 07/17/2020 CLINICAL DATA:  Chest pain. EXAM: PORTABLE CHEST 1 VIEW COMPARISON:  07/12/2020. FINDINGS: Mediastinum and hilar structures normal. Heart size normal. Low lung volumes with mild bibasilar atelectasis. Mild bilateral interstitial prominence. Mild pneumonitis cannot be excluded. Tiny left pleural effusion cannot be excluded. No pneumothorax. Degenerative change thoracic spine. Surgical clips right upper quadrant. IMPRESSION: Low lung volumes with mild bibasilar atelectasis. Mild bilateral interstitial prominence. Mild pneumonitis cannot be excluded. Tiny left pleural effusion cannot be excluded. Electronically Signed   By: Maisie Fus  Register   On: 07/17/2020 08:21   ECHOCARDIOGRAM COMPLETE  Result Date: 07/17/2020    ECHOCARDIOGRAM REPORT   Patient Name:   Casey Baldwin Date of Exam: 07/17/2020 Medical Rec #:  [id number]    Height:       64.0 in Accession #:    [id number]   Weight:       175.0 lb Date of Birth:  06-17-1941   BSA:          1.848 m Patient Age:    78 years     BP:           128/75 mmHg Patient Gender: F            HR:           74 bpm. Exam Location:  [facility] Procedure: 2D Echo, Color Doppler and Cardiac Doppler Indications:     R94.31 Abnormal ECG  History:         Patient has no prior history of Echocardiogram examinations.                  CAD; Risk Factors:Hypertension and HCL.  Sonographer:     Humphrey RollsJoan Heiss RDCS (AE) Referring Phys:  [id number] [facility]  AMERY Diagnosing Phys: Arnoldo HookerBruce Kowalski MD  Sonographer Comments: Suboptimal apical window and suboptimal subcostal window. TDS due to pt inability to follow breathing instructions during imaging. IMPRESSIONS  1. Left ventricular ejection fraction, by estimation, is 55 to 60%. The left ventricle has normal function. The left ventricle has no regional wall motion abnormalities. Left ventricular diastolic parameters were normal.  2. Right ventricular systolic function is normal. The right ventricular size is normal. There is normal pulmonary artery systolic pressure.  3. The mitral valve is normal in structure. Trivial mitral valve regurgitation.  4. The aortic valve is normal in structure. Aortic valve regurgitation is not visualized. FINDINGS  Left Ventricle: Left ventricular ejection fraction, by estimation, is 55 to 60%. The left ventricle has normal function. The left ventricle has no regional wall motion abnormalities. The left ventricular internal cavity size was normal in size. There is  no left ventricular hypertrophy. Left ventricular diastolic parameters were normal. Right Ventricle: The right ventricular size is normal. No increase in right ventricular wall thickness. Right ventricular systolic function is normal. There is normal pulmonary artery systolic pressure. The tricuspid regurgitant velocity is 2.09 m/s, and  with an assumed right atrial pressure of 10 mmHg, the estimated right ventricular systolic pressure is 27.5 mmHg. Left Atrium: Left atrial size was normal in size. Right Atrium: Right atrial size was normal in size. Pericardium: There is no evidence of pericardial effusion. Mitral Valve: The mitral valve is normal in structure. Trivial mitral valve regurgitation. MV peak gradient, 1.5 mmHg. The mean mitral valve gradient is 1.0 mmHg. Tricuspid Valve: The tricuspid valve is normal in structure. Tricuspid valve regurgitation is trivial. Aortic Valve: The aortic valve is normal in structure. Aortic  valve regurgitation is not visualized. Aortic valve mean gradient measures 3.0 mmHg. Aortic valve peak gradient measures 5.7 mmHg. Aortic valve area, by VTI measures 2.18 cm. Pulmonic Valve: The pulmonic valve was normal in structure. Pulmonic valve regurgitation is not visualized. Aorta: The aortic root and ascending aorta are structurally normal, with no evidence of dilitation. IAS/Shunts: No atrial level shunt detected by color flow Doppler.  LEFT VENTRICLE PLAX 2D LVIDd:         4.56 cm  Diastology LVIDs:         3.06 cm  LV e' lateral:   5.87 cm/s LV PW:         0.84 cm  LV E/e' lateral: 7.0 LV IVS:  0.96 cm LVOT diam:     1.80 cm LV SV:         40 LV SV Index:   22 LVOT Area:     2.54 cm  RIGHT VENTRICLE RV Basal diam:  3.70 cm LEFT ATRIUM           Index       RIGHT ATRIUM           Index LA diam:      3.40 cm 1.84 cm/m  RA Area:     15.70 cm LA Vol (A4C): 34.8 ml 18.83 ml/m RA Volume:   41.10 ml  22.24 ml/m  AORTIC VALVE                   PULMONIC VALVE AV Area (Vmax):    1.74 cm    PV Vmax:       1.08 m/s AV Area (Vmean):   2.07 cm    PV Vmean:      72.800 cm/s AV Area (VTI):     2.18 cm    PV VTI:        0.189 m AV Vmax:           119.00 cm/s PV Peak grad:  4.7 mmHg AV Vmean:          72.500 cm/s PV Mean grad:  2.0 mmHg AV VTI:            0.186 m AV Peak Grad:      5.7 mmHg AV Mean Grad:      3.0 mmHg LVOT Vmax:         81.30 cm/s LVOT Vmean:        58.900 cm/s LVOT VTI:          0.159 m LVOT/AV VTI ratio: 0.85  AORTA Ao Root diam: 3.20 cm MITRAL VALVE               TRICUSPID VALVE MV Area (PHT): 4.15 cm    TR Peak grad:   17.5 mmHg MV Peak grad:  1.5 mmHg    TR Vmax:        209.00 cm/s MV Mean grad:  1.0 mmHg MV Vmax:       0.60 m/s    SHUNTS MV Vmean:      44.8 cm/s   Systemic VTI:  0.16 m MV Decel Time: 183 msec    Systemic Diam: 1.80 cm MV E velocity: 41.20 cm/s MV A velocity: 66.00 cm/s MV E/A ratio:  0.62 Arnoldo Hooker MD Electronically signed by Arnoldo Hooker MD Signature  Date/Time: 07/17/2020/4:40:19 PM    Final      Assessment/Plan: Fever, headache, painful rash on the rt side of the back and chest with radiating pain from back to front Herpes zoster On acyclovir Will switch to PO valtrex today  Encephalopathy-intermittent   MRI with contrast normal  Etiology multifactorial- NO encephalitis. She could have aspetic meningitis ,  Acyclovir can cause encephalopathy but less likely as renal function okay.she has completed 5 days so will change to vatrex PO Norco can cause confusion She also has change in diurnal rhythm with sleeping day time and awake at night Also OSA but not wearing CPAP- but no co2 narcosis as per ABG Poor intake    ?she recently had a complicated stay at [facility] between 04/01/20 until 05/14/20 for abdominal pain which was diagnosed as choledocholithiasis/cholangitis in a setting of prior RYGB in 2008. 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   Rt sided abdominal firmness and fullness CT abdomen shows Fluid collection at lies along the posterior margin of the pancreas, predominantly collecting along the right anterior pararenal fascia. This may reflect an abscess. It could be a pseudocyst if there is a history of pancreatitis. It may be a postoperative collection, since the patient underwent a cholecystectomy since the prior CT. Collection measures approximately 14 x 4 x 10 cm in size. If she has fever it willl have to be aspirated and  cultured   Urine culture multiple species-received 5 days of ceftriaxone Hearing loss   Left TKA Fully vaccinated for COVID jan 2021  Discussed the management with her nurse

## 2020-07-19 DIAGNOSIS — R509 Fever, unspecified: Secondary | ICD-10-CM | POA: Diagnosis not present

## 2020-07-19 DIAGNOSIS — R519 Headache, unspecified: Secondary | ICD-10-CM | POA: Diagnosis not present

## 2020-07-19 DIAGNOSIS — B029 Zoster without complications: Secondary | ICD-10-CM | POA: Diagnosis not present

## 2020-07-19 DIAGNOSIS — R52 Pain, unspecified: Secondary | ICD-10-CM | POA: Diagnosis not present

## 2020-07-19 LAB — PHOSPHORUS: Phosphorus: 3.3 mg/dL (ref 2.5–4.6)

## 2020-07-19 LAB — MAGNESIUM: Magnesium: 2.1 mg/dL (ref 1.7–2.4)

## 2020-07-19 LAB — BASIC METABOLIC PANEL
Anion gap: 12 (ref 5–15)
BUN: 11 mg/dL (ref 8–23)
CO2: 23 mmol/L (ref 22–32)
Calcium: 8.8 mg/dL — ABNORMAL LOW (ref 8.9–10.3)
Chloride: 96 mmol/L — ABNORMAL LOW (ref 98–111)
Creatinine, Ser: 0.76 mg/dL (ref 0.44–1.00)
GFR calc Af Amer: >60 mL/min (ref >60)
GFR calc non Af Amer: >60 mL/min (ref >60)
Glucose, Bld: 130 mg/dL — ABNORMAL HIGH (ref 70–99)
Potassium: 3.4 mmol/L — ABNORMAL LOW (ref 3.5–5.1)
Sodium: 131 mmol/L — ABNORMAL LOW (ref 135–145)

## 2020-07-19 LAB — CBC WITH DIFFERENTIAL/PLATELET
Abs Immature Granulocytes: 0.03 K/uL (ref 0.00–0.07)
Basophils Absolute: 0 K/uL (ref 0.0–0.1)
Basophils Relative: 0 %
Eosinophils Absolute: 0.2 K/uL (ref 0.0–0.5)
Eosinophils Relative: 3 %
HCT: 32.7 % — ABNORMAL LOW (ref 36.0–46.0)
Hemoglobin: 11.2 g/dL — ABNORMAL LOW (ref 12.0–15.0)
Immature Granulocytes: 0 %
Lymphocytes Relative: 16 %
Lymphs Abs: 1.1 K/uL (ref 0.7–4.0)
MCH: 32.1 pg (ref 26.0–34.0)
MCHC: 34.3 g/dL (ref 30.0–36.0)
MCV: 93.7 fL (ref 80.0–100.0)
Monocytes Absolute: 0.6 K/uL (ref 0.1–1.0)
Monocytes Relative: 9 %
Neutro Abs: 4.9 K/uL (ref 1.7–7.7)
Neutrophils Relative %: 72 %
Platelets: 280 K/uL (ref 150–400)
RBC: 3.49 MIL/uL — ABNORMAL LOW (ref 3.87–5.11)
RDW: 13.7 % (ref 11.5–15.5)
WBC: 6.9 K/uL (ref 4.0–10.5)
nRBC: 0 % (ref 0.0–0.2)

## 2020-07-19 LAB — GLUCOSE, CAPILLARY
Glucose-Capillary: 116 mg/dL — ABNORMAL HIGH (ref 70–99)
Glucose-Capillary: 118 mg/dL — ABNORMAL HIGH (ref 70–99)
Glucose-Capillary: 129 mg/dL — ABNORMAL HIGH (ref 70–99)
Glucose-Capillary: 146 mg/dL — ABNORMAL HIGH (ref 70–99)
Glucose-Capillary: 192 mg/dL — ABNORMAL HIGH (ref 70–99)
Glucose-Capillary: 93 mg/dL (ref 70–99)

## 2020-07-19 MED ORDER — QUETIAPINE FUMARATE 25 MG PO TABS
25.0000 mg | ORAL_TABLET | Freq: Every day | ORAL | Status: DC
  Administered 2020-07-19: 25 mg via ORAL
  Filled 2020-07-19: qty 1

## 2020-07-19 MED ORDER — KCL IN DEXTROSE-NACL 20-5-0.9 MEQ/L-%-% IV SOLN
INTRAVENOUS | Status: DC
  Filled 2020-07-19 (×7): qty 1000

## 2020-07-19 MED ORDER — PEG 3350-KCL-NA BICARB-NACL 420 G PO SOLR: 2000.0000 mL | Freq: Once | ORAL | Status: DC

## 2020-07-19 MED ORDER — LACTULOSE 10 GM/15ML PO SOLN
20.0000 g | Freq: Two times a day (BID) | ORAL | Status: DC
  Administered 2020-07-19 – 2020-08-23 (×55): 20 g via ORAL
  Filled 2020-07-19 (×64): qty 30

## 2020-07-19 MED ORDER — POTASSIUM CHLORIDE 20 MEQ PO PACK
40.0000 meq | PACK | Freq: Once | ORAL | Status: AC
  Administered 2020-07-19: 40 meq via ORAL
  Filled 2020-07-19: qty 2

## 2020-07-19 NOTE — Plan of Care (Signed)
  Problem: Health Behavior/Discharge Planning: Goal: Ability to manage health-related needs will improve Outcome: Progressing   

## 2020-07-19 NOTE — Progress Notes (Signed)
Physical Therapy Treatment Patient Details Name: Casey Baldwin MRN: [id number] DOB: 02-12-1941 Today's Date: 07/19/2020    History of Present Illness Casey Baldwin is a 79 y.o. 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  Presented to ER secondary to generalized body aches, progressive weakness, headaches; admitted for management of SIRS (urine?), shingles and acute metabolic encephalopathy.    PT Comments    Patient with noted increase in confusion this date; significant difficulty following commands, communicating wants/needs and attending to task/conversation at hand.  Generally restless throughout session. Extended time and effort to initiate and complete all tasks.  Unable to comprehend use of RW this date; preferred single HHA, mod assist for weight shift and movement with all tasks.   RN informed/aware of change in status from previous session.  Given change in status, unsafe for discharge home at this level; discharge recommendations updated to STR to allow for additional support/rehab prior to return home.  CSW informed/aware.     Follow Up Recommendations  SNF     Equipment Recommendations       Recommendations for Other Services       Precautions / Restrictions Precautions Precautions: Fall Restrictions Weight Bearing Restrictions: No    Mobility  Bed Mobility Overal bed mobility: Needs Assistance Bed Mobility: Supine to Sit     Supine to sit: Mod assist     General bed mobility comments: extensive assist for LE management and truncal elevation  Transfers Overall transfer level: Needs assistance Equipment used: None Transfers: Sit to/from Stand Sit to Stand: Mod assist Stand pivot transfers: Mod assist       General transfer comment: prefers to pull on therapist UE, manual facilitation and hand-over-hand required to initiate and guide movement  Ambulation/Gait   Gait  Distance (Feet):  (3 steps x3)         General Gait Details: very choppy gait pattern, poor balance; manual facilitation for weight shift, balance and overall safety.  Poor recall of task during activity   Stairs             Wheelchair Mobility    Modified Rankin (Stroke Patients Only)       Balance Overall balance assessment: Needs assistance Sitting-balance support: No upper extremity supported;Feet supported Sitting balance-Leahy Scale: Fair     Standing balance support: Single extremity supported Standing balance-Leahy Scale: Poor                              Cognition Arousal/Alertness: Awake/alert Behavior During Therapy: Restless Overall Cognitive Status: Impaired/Different from baseline                                 General Comments: difficulty following commands and attending to therapist this date; globally confused, restless and generally unable to effectively communciate wants/needs      Exercises Other Exercises Other Exercises: Bed/chair, chair/BSC transfer, mod assist for lift off, balance and overall task initiatin/sequencing.  Patient generally restless, poor awareness of task at hand; often forgetting task mid-activity.    General Comments        Pertinent Vitals/Pain Pain Assessment: Faces Faces Pain Scale: Hurts little more Pain Location: back pain Pain Descriptors / Indicators: Aching;Grimacing;Guarding Pain Intervention(s): Limited activity within patient's tolerance;Monitored during session;Patient requesting pain meds-RN notified;RN gave pain meds during session;Repositioned  Home Living                      Prior Function            PT Goals (current goals can now be found in the care plan section) Acute Rehab PT Goals Patient Stated Goal: To return home  PT Goal Formulation: With patient Time For Goal Achievement: 07/27/20 Potential to Achieve Goals: Fair Progress towards PT goals:  Not progressing toward goals - comment    Frequency    Min 2X/week      PT Plan Current plan remains appropriate    Co-evaluation              AM-PAC PT "6 Clicks" Mobility   Outcome Measure  Help needed turning from your back to your side while in a flat bed without using bedrails?: A Lot Help needed moving from lying on your back to sitting on the side of a flat bed without using bedrails?: A Lot Help needed moving to and from a bed to a chair (including a wheelchair)?: A Lot Help needed standing up from a chair using your arms (e.g., wheelchair or bedside chair)?: A Lot Help needed to walk in hospital room?: A Lot Help needed climbing 3-5 steps with a railing? : Total 6 Click Score: 11    End of Session   Activity Tolerance: Patient limited by pain Patient left: in chair;with call bell/phone within reach;with chair alarm set Nurse Communication: Mobility status PT Visit Diagnosis: Muscle weakness (generalized) (M62.81);Difficulty in walking, not elsewhere classified (R26.2)     Time: 1610-9604 PT Time Calculation (min) (ACUTE ONLY): 39 min  Charges:  $Therapeutic Activity: 38-52 mins                     Siegfried Vieth H. Manson Passey, PT, DPT, NCS 07/19/20, 4:19 PM [phone]

## 2020-07-19 NOTE — TOC Progression Note (Signed)
Transition of Care [facility]) - Progression Note    Patient Details  Name: Casey Baldwin MRN: [id number] Date of Birth: 08-Aug-1941  [facility] CM/SW Contact  Trenton Founds, RN Phone Number: 07/19/2020, 10:21 AM  Clinical Narrative:   RNCM reached out to patient's daughter Casey Baldwin per request of attending MD. At this point Casey Baldwin has decided that it will likely be necessary for patient to go to short term rehab at discharge due to her continued weakness and ongoing medical problems. Discussed that patient will need another therapy evaluation to determine if she is eligible. This request was passed to the therapist.  RNCM verified PASSR, completed FL-2 and started bed search.          Expected Discharge Plan and Services                                                 Social Determinants of Health (SDOH) Interventions    Readmission Risk Interventions No flowsheet data found.

## 2020-07-19 NOTE — Progress Notes (Signed)
Patient's IV infiltrated, removed by Clinical research associate. Writer and another nurse attempted to replace IV was unsuccessful small veins and veins blew. Order placed for IV team to place new IV. Patient has order for IVF.

## 2020-07-19 NOTE — Progress Notes (Signed)
PROGRESS NOTE    Casey Baldwin  BJY:[id number] DOB: 15-Sep-1941 DOA: 07/12/2020 PCP: Lesly RubensteinVerka, [name], MD   Chief complaint.  Generalized weakness.  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 Rash is intensely pruritic different stages of healing some areas of vesicular be red border. Some areas covered in black eschars. Skin is diffusely tender.Per family (asked by me) pt developed confusion. Was c/o side to back pain.  Sed rate was elevated. LP was considered but patient declined stating that she had a history of meningitis in the past and very scared of having LP done. Hospitalist was called for admission for shingles and possible encephalitis.  9/8.  IV acyclovir was changed to Valtrex.  Patient has not had a bowel movement since admission, has poor appetite.  Giving lactulose, increase the Protonix to twice a day.  9/9.  Patient refused CPAP last night, she became more confused today.  She also has reversed sleeping cycle.  Added Seroquel 25 mg evening.  Schedule lactulose twice a day for constipation.  Assessment & Plan:   Active Problems:   SIRS (systemic inflammatory response syndrome) (HCC)   Shingles rash   Delirium   Acute metabolic encephalopathy   Essential hypertension   Anemia   Acute lower UTI   Chronic diastolic CHF (congestive heart failure) (HCC)   Sepsis (HCC)   Change in mental status  #1.  Sepsis. Secondary to UTI as well as viral infection. Condition improved.  Antibiotics completed.  2.  Shingles with aseptic meningitis. Followed by infectious  disease. Completed 5 days of IV acyclovir, continue Valtrex.  3.  Acute metabolic encephalopathy. Secondary to shingles antiviral meningitis. Patient also has reversed sleeping cycle, added Seroquel 25 mg every evening. Patient has been refusing CPAP for the last 2 nights.  Spoke to the nurse and the daughter, patient need to wear the CPAP.  ABG was reassuring, no significant CO2 retention.  4.  Anorexia with a severe constipation. Continue Protonix twice a day.  Schedule lactulose 20 g twice a day. Due to poor p.o. intake, I will give gentle rehydration to prevent dehydration with added potassium for mild hypokalemia.  5.  PSVT. No recurrence.  6.  Essential hypertension. Continue with beta-blocker.  7.  Obstructive sleep apnea. Advised nurse to give patient a CPAP while asleep.  8.  Urinary tract infection. Antibiotics completed.  9.  Chronic diastolic congestive heart failure. No volume overload.  10.  Type 2 diabetes. Controlled.    DVT prophylaxis: Lovenox Code Status: Full Family Communication: Discussed with daughter at the patient bedside. Disposition Plan: Due to severe weakness, family requested nursing home placement. .   Status is: Inpatient  Remains inpatient appropriate because:IV treatments appropriate due to intensity of illness or inability to take PO  Multiple unresolved medical problems.   Dispo: The patient is from: Home              Anticipated d/c is to: SNF              Anticipated d/c date is: > 3 days  Patient currently is not medically stable to d/c.        I/O last 3 completed shifts: In: 361.3 [I.V.:361.3] Out: -  No intake/output data recorded.     Consultants:   Infectious disease and neurology  Procedures: None  Antimicrobials: Acyclovir  Subjective: Patient refused CPAP last night.  She is very confused this morning.  Still has very poor appetite, not taking much intake.  Constipated, still no bowel  movement after lactulose. No abdominal pain or nausea vomiting.  Denies any short of breath or cough.   No fever or chills.  No dysuria hematuria.   Objective: Vitals:   07/18/20 0757 07/18/20 1504 07/18/20 2322 07/19/20 0809  BP: 133/64 (!) 128/58 (!) 143/80 (!) 150/79  Pulse: 99 76 79 90  Resp: 18 17 16 20   Temp: 98.4 F (36.9 C) 98.7 F (37.1 C) 98.8 F (37.1 C) 99.7 F (37.6 C)  TempSrc: Oral Oral Oral Oral  SpO2: 97% 97% 94% 94%  Weight:      Height:        Intake/Output Summary (Last 24 hours) at 07/19/2020 0956 Last data filed at 07/18/2020 1700 Gross per 24 hour  Intake 361.3 ml  Output --  Net 361.3 ml   Filed Weights   07/12/20 0918 07/12/20 0921  Weight: 79.4 kg 79.4 kg    Examination:  General exam: Appears calm and comfortable  Respiratory system: Clear to auscultation. Respiratory effort normal. Cardiovascular system: S1 & S2 heard, RRR. No JVD, murmurs, rubs, gallops or clicks. No pedal edema. Gastrointestinal system: Abdomen is nondistended, soft and nontender.  Normal bowel sounds heard. Central nervous system: Drowsy and oriented to herself. No focal neurological deficits. Extremities: Symmetric  Skin: No rashes, lesions or ulcers     Data Reviewed: I have personally reviewed following labs and imaging studies  CBC: Recent Labs  Lab 07/12/20 2108 07/13/20 0710 07/16/20 1736 07/17/20 0928 07/19/20 0533  WBC 5.5 4.9 6.4 8.0 6.9  NEUTROABS 3.8 3.0  --   --  4.9  HGB 9.9* 9.2* 11.7* 11.5* 11.2*  HCT 30.2* 28.3* 33.8* 34.1* 32.7*  MCV 98.4 99.3 94.2 96.9 93.7  PLT 246 227 297 305 280   Basic Metabolic Panel: Recent Labs  Lab 07/12/20 2108 07/13/20 0710 07/13/20 0710 07/14/20 0830 07/15/20 0935 07/16/20 1915 07/17/20 0928 07/19/20 0533  NA  --  134*   < > 131* 132* 132* 132* 131*  K  --  3.3*   < > 3.7 3.8 3.3* 3.8 3.4*  CL  --  102   < > 99 97* 95* 96* 96*  CO2  --  23   < > 21* 23 25 24 23   GLUCOSE  --  108*   < > 100* 112* 120*  133* 130*  BUN  --  15   < > 12 15 15 14 11   CREATININE  --  0.86   < > 0.85 0.91 0.77 0.81 0.76  CALCIUM  --  8.3*   < > 8.6* 8.8* 8.6* 8.9 8.8*  MG 1.8 1.8  --   --   --   --   --  2.1  PHOS  --  3.8  --   --   --   --   --  3.3   < > = values in this interval not displayed.   GFR: Estimated Creatinine Clearance: 57.2 mL/min (by C-G formula based on SCr of 0.76 mg/dL). Liver Function Tests: Recent Labs  Lab  07/12/20 2108 07/13/20 0710 07/16/20 1915  AST 14* 14* 18  ALT ALKPHOS 62 55 64  BILITOT 1.1 0.9 0.7  PROT 7.1 6.3* 6.8  ALBUMIN 3.6 3.3* 3.4*   Recent Labs  Lab 07/12/20 2108  LIPASE 27   No results for input(s): AMMONIA in the last 168 hours. Coagulation Profile: No results for input(s): INR, PROTIME in the last 168 hours. Cardiac Enzymes: Recent Labs  Lab 07/12/20 2108  CKTOTAL 46   BNP (last 3 results) No results for input(s): PROBNP in the last 8760 hours. HbA1C: No results for input(s): HGBA1C in the last 72 hours. CBG: Recent Labs  Lab 07/18/20 1459 07/18/20 2005 07/18/20 2335 07/19/20 0351 07/19/20 0811  GLUCAP 129* 115* 132* 116* 146*   Lipid Profile: No results for input(s): CHOL, HDL, LDLCALC, TRIG, CHOLHDL, LDLDIRECT in the last 72 hours. Thyroid Function Tests: No results for input(s): TSH, T4TOTAL, FREET4, T3FREE, THYROIDAB in the last 72 hours. Anemia Panel: No results for input(s): VITAMINB12, FOLATE, FERRITIN, TIBC, IRON, RETICCTPCT in the last 72 hours. Sepsis Labs: Recent Labs  Lab 07/12/20 2108  PROCALCITON <0.10  LATICACIDVEN 0.9    Recent Results (from the past 240 hour(s))  SARS Coronavirus 2 by RT PCR (hospital order, performed in [facility] hospital lab) Nasopharyngeal Nasopharyngeal Swab     Status: None   Collection Time: 07/10/20  2:48 PM   Specimen: Nasopharyngeal Swab  Result Value Ref Range Status   SARS Coronavirus 2 NEGATIVE NEGATIVE Final    Comment: (NOTE) SARS-CoV-2 target nucleic acids are NOT  DETECTED.  The SARS-CoV-2 RNA is generally detectable in upper and lower respiratory specimens during the acute phase of infection. The lowest concentration of SARS-CoV-2 viral copies this assay can detect is 250 copies / mL. A negative result does not preclude SARS-CoV-2 infection and should not be used as the sole basis for treatment or other patient management decisions.  A negative result may occur with improper specimen collection / handling, submission of specimen other than nasopharyngeal swab, presence of viral mutation(s) within the areas targeted by this assay, and inadequate number of viral copies (<250 copies / mL). A negative result must be combined with clinical observations, patient history, and epidemiological information.  Fact Sheet for Patients:   BoilerBrush.com.cy  Fact Sheet for Healthcare Providers: https://pope.com/  This test is not yet approved or  cleared by the [place] FDA and has been authorized for detection and/or diagnosis of SARS-CoV-2 by FDA under an Emergency Use Authorization (EUA).  This EUA will remain in effect (meaning this test can be used) for the duration of the COVID-19 declaration under Section 564(b)(1) of the Act, 21 U.S.C. section 360bbb-3(b)(1), unless the authorization is terminated or revoked sooner.  Performed at [facility], [address] [place], [place] [address]   Urine culture     Status: Abnormal   Collection Time: 07/12/20  9:23 PM   Specimen: Urine, Random  Result Value Ref Range Status   Specimen Description   Final    URINE, RANDOM Performed at [facility], [address] [place], [place] [address]    Special Requests   Final    NONE Performed at [facility], [address] [place], [place] [address]    Culture MULTIPLE SPECIES PRESENT, SUGGEST RECOLLECTION (A)  Final   Report Status 07/15/2020 FINAL  Final  Culture, blood  (routine x 2)     Status: None   Collection Time: 07/12/20  9:23 PM   Specimen: BLOOD  Result Value Ref Range Status   Specimen Description BLOOD RIGHT AC  Final   Special Requests   Final    BOTTLES DRAWN AEROBIC AND ANAEROBIC Blood Culture results may not be optimal due to an excessive volume of blood received in culture bottles   Culture   Final    NO GROWTH 5 DAYS Performed at [facility], [address], [place], [place] [address]    Report Status 07/17/2020 FINAL  Final  Culture, blood (routine x 2)     Status: None   Collection Time: 07/12/20  9:23 PM   Specimen: BLOOD  Result Value Ref Range Status   Specimen Description BLOOD RIGHT WRIST  Final   Special Requests   Final    BOTTLES DRAWN AEROBIC AND ANAEROBIC Blood Culture adequate volume   Culture   Final    NO GROWTH 5 DAYS Performed at [facility], [address], [place], [place] [address]    Report Status 07/17/2020 FINAL  Final  SARS Coronavirus 2 by RT PCR (hospital order, performed in [facility] hospital lab) Nasopharyngeal Nasopharyngeal Swab     Status: None   Collection Time: 07/12/20 11:13 PM   Specimen: Nasopharyngeal Swab  Result Value Ref Range Status   SARS Coronavirus 2 NEGATIVE NEGATIVE Final    Comment: (NOTE) SARS-CoV-2 target nucleic acids are NOT DETECTED.  The SARS-CoV-2 RNA is generally detectable in upper and lower respiratory specimens during the acute phase of infection. The lowest concentration of SARS-CoV-2 viral copies this assay can detect is 250 copies / mL. A negative result does not preclude SARS-CoV-2 infection and should not be used as the sole basis for treatment or other patient management decisions.  A negative result may occur with improper specimen collection / handling, submission of specimen other than nasopharyngeal swab, presence of viral mutation(s) within the areas targeted by this assay, and inadequate number of viral copies (<250 copies / mL). A  negative result must be combined with clinical observations, patient history, and epidemiological information.  Fact Sheet for Patients:   BoilerBrush.com.cy  Fact Sheet for Healthcare Providers: https://pope.com/  This test is not yet approved or  cleared by the [place] FDA and has been authorized for detection and/or diagnosis of SARS-CoV-2 by FDA under an Emergency Use Authorization (EUA).  This EUA will remain in effect (meaning this test can be used) for the duration of the COVID-19 declaration under Section 564(b)(1) of the Act, 21 U.S.C. section 360bbb-3(b)(1), unless the authorization is terminated or revoked sooner.  Performed at [facility], [address] [place], [place] [address]   MRSA PCR Screening     Status: None   Collection Time: 07/13/20  2:02 AM   Specimen: Nasal Mucosa; Nasopharyngeal  Result Value Ref Range Status   MRSA by PCR NEGATIVE NEGATIVE Final    Comment:        The GeneXpert MRSA Assay (FDA approved for NASAL specimens only), is one component of a comprehensive MRSA colonization surveillance program. It is not intended to diagnose MRSA infection nor to guide or monitor treatment for MRSA infections. Performed at [facility], [address] [place], [place] [address]   CULTURE, BLOOD (ROUTINE X 2) w Reflex to ID Panel     Status: None (Preliminary result)   Collection Time: 07/16/20  5:36 PM   Specimen: BLOOD  Result Value Ref Range Status   Specimen Description BLOOD [facility]  Final  Special Requests   Final    BOTTLES DRAWN AEROBIC ONLY Blood Culture adequate volume   Culture   Final    NO GROWTH 3 DAYS Performed at [facility], [address] [place], [place] [address]    Report Status PENDING  Incomplete  CULTURE, BLOOD (ROUTINE X 2) w Reflex to ID Panel     Status: None (Preliminary result)   Collection Time: 07/16/20  5:36 PM   Specimen: BLOOD   Result Value Ref Range Status   Specimen Description BLOOD [facility]  Final   Special Requests   Final    BOTTLES DRAWN AEROBIC AND ANAEROBIC Blood Culture adequate volume   Culture   Final    NO GROWTH 3 DAYS Performed at [facility], [address] [place], [place] [address]    Report Status PENDING  Incomplete         Radiology Studies: ECHOCARDIOGRAM COMPLETE  Result Date: 07/17/2020    ECHOCARDIOGRAM REPORT   Patient Name:   Paulena Ketcherside Date of Exam: 07/17/2020 Medical Rec #:  [id number]    Height:       64.0 in Accession #:    [id number]   Weight:       175.0 lb Date of Birth:  February 07, 1941   BSA:          1.848 m Patient Age:    78 years     BP:           128/75 mmHg Patient Gender: F            HR:           74 bpm. Exam Location:  [facility] Procedure: 2D Echo, Color Doppler and Cardiac Doppler Indications:     R94.31 Abnormal ECG  History:         Patient has no prior history of Echocardiogram examinations.                  CAD; Risk Factors:Hypertension and HCL.  Sonographer:     Humphrey Rolls RDCS (AE) Referring Phys:  [id number] [facility] Diagnosing Phys: Arnoldo Hooker MD  Sonographer Comments: Suboptimal apical window and suboptimal subcostal window. TDS due to pt inability to follow breathing instructions during imaging. IMPRESSIONS  1. Left ventricular ejection fraction, by estimation, is 55 to 60%. The left ventricle has normal function. The left ventricle has no regional wall motion abnormalities. Left ventricular diastolic parameters were normal.  2. Right ventricular systolic function is normal. The right ventricular size is normal. There is normal pulmonary artery systolic pressure.  3. The mitral valve is normal in structure. Trivial mitral valve regurgitation.  4. The aortic valve is normal in structure. Aortic valve regurgitation is not visualized. FINDINGS  Left Ventricle: Left ventricular ejection fraction, by estimation, is 55 to 60%. The left ventricle has normal function.  The left ventricle has no regional wall motion abnormalities. The left ventricular internal cavity size was normal in size. There is  no left ventricular hypertrophy. Left ventricular diastolic parameters were normal. Right Ventricle: The right ventricular size is normal. No increase in right ventricular wall thickness. Right ventricular systolic function is normal. There is normal pulmonary artery systolic pressure. The tricuspid regurgitant velocity is 2.09 m/s, and  with an assumed right atrial pressure of 10 mmHg, the estimated right ventricular systolic pressure is 27.5 mmHg. Left Atrium: Left atrial size was normal in size. Right Atrium: Right atrial size was normal in size. Pericardium: There is no evidence  of pericardial effusion. Mitral Valve: The mitral valve is normal in structure. Trivial mitral valve regurgitation. MV peak gradient, 1.5 mmHg. The mean mitral valve gradient is 1.0 mmHg. Tricuspid Valve: The tricuspid valve is normal in structure. Tricuspid valve regurgitation is trivial. Aortic Valve: The aortic valve is normal in structure. Aortic valve regurgitation is not visualized. Aortic valve mean gradient measures 3.0 mmHg. Aortic valve peak gradient measures 5.7 mmHg. Aortic valve area, by VTI measures 2.18 cm. Pulmonic Valve: The pulmonic valve was normal in structure. Pulmonic valve regurgitation is not visualized. Aorta: The aortic root and ascending aorta are structurally normal, with no evidence of dilitation. IAS/Shunts: No atrial level shunt detected by color flow Doppler.  LEFT VENTRICLE PLAX 2D LVIDd:         4.56 cm  Diastology LVIDs:         3.06 cm  LV e' lateral:   5.87 cm/s LV PW:         0.84 cm  LV E/e' lateral: 7.0 LV IVS:        0.96 cm LVOT diam:     1.80 cm LV SV:         40 LV SV Index:   22 LVOT Area:     2.54 cm  RIGHT VENTRICLE RV Basal diam:  3.70 cm LEFT ATRIUM           Index       RIGHT ATRIUM           Index LA diam:      3.40 cm 1.84 cm/m  RA Area:     15.70 cm  LA Vol (A4C): 34.8 ml 18.83 ml/m RA Volume:   41.10 ml  22.24 ml/m  AORTIC VALVE                   PULMONIC VALVE AV Area (Vmax):    1.74 cm    PV Vmax:       1.08 m/s AV Area (Vmean):   2.07 cm    PV Vmean:      72.800 cm/s AV Area (VTI):     2.18 cm    PV VTI:        0.189 m AV Vmax:           119.00 cm/s PV Peak grad:  4.7 mmHg AV Vmean:          72.500 cm/s PV Mean grad:  2.0 mmHg AV VTI:            0.186 m AV Peak Grad:      5.7 mmHg AV Mean Grad:      3.0 mmHg LVOT Vmax:         81.30 cm/s LVOT Vmean:        58.900 cm/s LVOT VTI:          0.159 m LVOT/AV VTI ratio: 0.85  AORTA Ao Root diam: 3.20 cm MITRAL VALVE               TRICUSPID VALVE MV Area (PHT): 4.15 cm    TR Peak grad:   17.5 mmHg MV Peak grad:  1.5 mmHg    TR Vmax:        209.00 cm/s MV Mean grad:  1.0 mmHg MV Vmax:       0.60 m/s    SHUNTS MV Vmean:      44.8 cm/s   Systemic VTI:  0.16 m MV Decel Time: 183 msec  Systemic Diam: 1.80 cm MV E velocity: 41.20 cm/s MV A velocity: 66.00 cm/s MV E/A ratio:  0.62 Arnoldo Hooker MD Electronically signed by Arnoldo Hooker MD Signature Date/Time: 07/17/2020/4:40:19 PM    Final         Scheduled Meds: . docusate sodium  100 mg Oral BID  . enoxaparin (LOVENOX) injection  40 mg Subcutaneous Q24H  . feeding supplement (ENSURE ENLIVE)  237 mL Oral BID BM  . Ferrous Fumarate  1 tablet Oral BID  . gabapentin  100 mg Oral BID  . insulin aspart  0-9 Units Subcutaneous Q4H  . lactulose  20 g Oral BID  . metoprolol tartrate  12.5 mg Oral BID  . multivitamin with minerals  1 tablet Oral Daily  . pantoprazole  20 mg Oral BID  . QUEtiapine  25 mg Oral QHS  . rosuvastatin  20 mg Oral Daily  . sodium chloride flush  3 mL Intravenous Q12H  . valACYclovir  1,000 mg Oral TID   Continuous Infusions: . dextrose 5 % and 0.9 % NaCl with KCl 20 mEq/L       LOS: 6 days    Time spent: 36 minutes    Marrion Coy, MD [facility]   To contact the attending provider between 7A-7P or the  covering provider during after hours 7P-7A, please log into the web site [email or web address] and access using universal [place] password for that web site. If you do not have the password, please call the hospital operator.  07/19/2020, 9:56 AM

## 2020-07-19 NOTE — [place] FL2 (Signed)
[place] MEDICAID FL2 LEVEL OF CARE SCREENING TOOL     IDENTIFICATION  Patient Name: Casey Baldwin Birthdate: 02-24-1941 Sex: female Admission Date (Current Location): 07/12/2020  [place] and [place] Number:  Chiropodist and Address:  [facility], [address], [place], [place] [address]      Provider Number: [id number]  Attending Physician Name and Address:  Marrion Coy, MD  Relative Name and Phone Number:  Leward Quan [phone]    Current Level of Care: Hospital Recommended Level of Care: Skilled Nursing Facility Prior Approval Number:    Date Approved/Denied:   PASRR Number: [id number]  Discharge Plan: SNF    Current Diagnoses: Patient Active Problem List   Diagnosis Date Noted  . Sepsis (HCC) 07/13/2020  . Change in mental status 07/13/2020  . SIRS (systemic inflammatory response syndrome) (HCC) 07/12/2020  . Shingles rash 07/12/2020  . Delirium 07/12/2020  . Acute metabolic encephalopathy 07/12/2020  . Essential hypertension 07/12/2020  . Anemia 07/12/2020  . Acute lower UTI 07/12/2020  . Chronic diastolic CHF (congestive heart failure) (HCC) 07/12/2020    Orientation RESPIRATION BLADDER Height & Weight     Self, Time, Situation, Place  Normal External catheter Weight: 79.4 kg Height:  5\' 4"  (162.6 cm)  BEHAVIORAL SYMPTOMS/MOOD NEUROLOGICAL BOWEL NUTRITION STATUS      Incontinent Diet (Regular)  AMBULATORY STATUS COMMUNICATION OF NEEDS Skin   Extensive Assist Verbally Other (Comment)                       Personal Care Assistance Level of Assistance  Total care       Total Care Assistance: Limited assistance   Functional Limitations Info  Speech, Hearing, Sight Sight Info: Adequate Hearing Info: Impaired Speech Info: Adequate    SPECIAL CARE FACTORS FREQUENCY  PT (By licensed PT), OT (By licensed OT)                    Contractures Contractures Info: Not present    Additional Factors  Info  Code Status, Allergies Code Status Info: Full Allergies Info: Lidocaine, Amlodipine, Lactose, Latex           Current Medications (07/19/2020):  This is the current hospital active medication list Current Facility-Administered Medications  Medication Dose Route Frequency Provider Last Rate Last Admin  . acetaminophen (TYLENOL) tablet 650 mg  650 mg Oral Q6H PRN 09/18/2020, MD   650 mg at 07/14/20 1640   Or  . acetaminophen (TYLENOL) suppository 650 mg  650 mg Rectal Q6H PRN Doutova, Anastassia, MD      . dextrose 5 % and 0.9 % NaCl with KCl 20 mEq/L infusion   Intravenous Continuous 09/13/20, MD      . docusate sodium (COLACE) capsule 100 mg  100 mg Oral BID Marrion Coy, MD   100 mg at 07/19/20 0838  . enoxaparin (LOVENOX) injection 40 mg  40 mg Subcutaneous Q24H 09/18/20, MD   40 mg at 07/18/20 2052  . feeding supplement (ENSURE ENLIVE) (ENSURE ENLIVE) liquid 237 mL  237 mL Oral BID BM 2053, MD   237 mL at 07/18/20 0950  . Ferrous Fumarate (HEMOCYTE - 106 mg FE) tablet 106 mg of iron  1 tablet Oral BID 09/17/20, MD   106 mg of iron at 07/19/20 0838  . gabapentin (NEURONTIN) capsule 100 mg  100 mg Oral BID 09/18/20, MD   100 mg at 07/19/20 0847  .  HYDROcodone-acetaminophen (NORCO/VICODIN) 5-325 MG per tablet 1-2 tablet  1-2 tablet Oral Q4H PRN Therisa Doyne, MD   2 tablet at 07/18/20 2051  . insulin aspart (novoLOG) injection 0-9 Units  0-9 Units Subcutaneous Q4H Therisa Doyne, MD   1 Units at 07/18/20 1746  . lactulose (CHRONULAC) 10 GM/15ML solution 20 g  20 g Oral BID Marrion Coy, MD      . metoprolol tartrate (LOPRESSOR) tablet 12.5 mg  12.5 mg Oral BID Lynn Ito, MD   12.5 mg at 07/19/20 0848  . multivitamin with minerals tablet 1 tablet  1 tablet Oral Daily Lynn Ito, MD   1 tablet at 07/19/20 [phone]  . ondansetron (ZOFRAN) tablet 4 mg  4 mg Oral Q6H PRN Doutova, Anastassia, MD       Or  . ondansetron (ZOFRAN) injection 4  mg  4 mg Intravenous Q6H PRN Doutova, Anastassia, MD   4 mg at 07/13/20 1148  . pantoprazole (PROTONIX) EC tablet 20 mg  20 mg Oral BID Marrion Coy, MD   20 mg at 07/19/20 1914  . QUEtiapine (SEROQUEL) tablet 25 mg  25 mg Oral QHS Marrion Coy, MD      . rosuvastatin (CRESTOR) tablet 20 mg  20 mg Oral Daily Doutova, Anastassia, MD   20 mg at 07/19/20 0850  . sodium chloride flush (NS) 0.9 % injection 3 mL  3 mL Intravenous Q12H Doutova, Anastassia, MD   3 mL at 07/19/20 0900  . valACYclovir (VALTREX) tablet 1,000 mg  1,000 mg Oral TID Lynn Ito, MD   1,000 mg at 07/19/20 7829     Discharge Medications: Please see discharge summary for a list of discharge medications.  Relevant Imaging Results:  Relevant Lab Results:   Additional Information [id number]  Trenton Founds, RN

## 2020-07-19 NOTE — Progress Notes (Addendum)
Date of Admission:  07/12/2020     ID: Casey Baldwin is a 79 y.o. female  Active Problems:   SIRS (systemic inflammatory response syndrome) (HCC)   Shingles rash   Delirium   Acute metabolic encephalopathy   Essential hypertension   Anemia   Acute lower UTI   Chronic diastolic CHF (congestive heart failure) (HCC)   Sepsis (HCC)   Change in mental status    Subjective: Pt was sleeping in the chair Woke her up She was confused Did follow some commands Medications:   docusate sodium  100 mg Oral BID   enoxaparin (LOVENOX) injection  40 mg Subcutaneous Q24H   feeding supplement (ENSURE ENLIVE)  237 mL Oral BID BM   Ferrous Fumarate  1 tablet Oral BID   gabapentin  100 mg Oral BID   insulin aspart  0-9 Units Subcutaneous Q4H   lactulose  20 g Oral BID   metoprolol tartrate  12.5 mg Oral BID   multivitamin with minerals  1 tablet Oral Daily   pantoprazole  20 mg Oral BID   QUEtiapine  25 mg Oral QHS   rosuvastatin  20 mg Oral Daily   sodium chloride flush  3 mL Intravenous Q12H   valACYclovir  1,000 mg Oral TID   Patient Vitals for the past 24 hrs:  BP Temp Temp src Pulse Resp SpO2  07/19/20 0809 (!) 150/79 99.7 F (37.6 C) Oral 90 20 94 %  07/18/20 2322 (!) 143/80 98.8 F (37.1 C) Oral 79 16 94 %  07/18/20 1504 (!) 128/58 98.7 F (37.1 C) Oral 76 17 97 %   Objective: Vital signs in last 24 hours: Temp:  [98.7 F (37.1 C)-99.7 F (37.6 C)] 99.7 F (37.6 C) (09/09 0809) Pulse Rate:  [76-90] 90 (09/09 0809) Resp:  [16-20] 20 (09/09 0809) BP: (128-150)/(58-80) 150/79 (09/09 0809) SpO2:  [94 %-97 %] 94 % (09/09 0809)  PHYSICAL EXAM:  General:somnolent, on waking up she was oriented in person ,followed command like lifting her arms, legs but not consistently  , she was confused though and also hard of hearing  .I fed her banana, tea,cracker and she was able to swallow well with no coughing  Head: Normocephalic, without obvious abnormality,  atraumatic. Eyes: Conjunctivae clear, anicteric sclerae. Pupils are equal ENT Nares normal. No drainage or sinus tenderness. Lips, mucosa, and tongue normal. No Thrush Neck: Supple, symmetrical, no adenopathy, thyroid: non tender no carotid bruit and no JVD. Back: No CVA tenderness. Lungs: Clear to auscultation bilaterally. No Wheezing or Rhonchi. No rales. Heart: Regular rate and rhythm, no murmur, rub or gallop. Abdomen: Soft, non-tender,not distended. Bowel sounds normal. No masses Extremities:rt forearm -previous line site has redness and tenderness and induration Skin: T5, T6 dermatomal zoster on the rt side None else where Lymph: Cervical, supraclavicular normal. Neurologic: Grossly non-focal  Lab Results Recent Labs    07/17/20 0928 07/19/20 0533  WBC 8.0 6.9  HGB 11.5* 11.2*  HCT 34.1* 32.7*  NA 132* 131*  K 3.8 3.4*  CL 96* 96*  CO2 24 23  BUN 14 11  CREATININE 0.81 0.76   Liver Panel Recent Labs    07/16/20 1915  PROT 6.8  ALBUMIN 3.4*  AST 18  ALT 13  ALKPHOS 64  BILITOT 0.7   Sedimentation Rate No results for input(s): ESRSEDRATE in the last 72 hours. C-Reactive Protein No results for input(s): CRP in the last 72 hours.  Microbiology:  Studies/Results: ECHOCARDIOGRAM COMPLETE  Result Date:  07/17/2020    ECHOCARDIOGRAM REPORT   Patient Name:   Casey Baldwin Date of Exam: 07/17/2020 Medical Rec #:  [id number]    Height:       64.0 in Accession #:    [id number]   Weight:       175.0 lb Date of Birth:  Aug 30, 1941   BSA:          1.848 m Patient Age:    78 years     BP:           128/75 mmHg Patient Gender: F            HR:           74 bpm. Exam Location:  [facility] Procedure: 2D Echo, Color Doppler and Cardiac Doppler Indications:     R94.31 Abnormal ECG  History:         Patient has no prior history of Echocardiogram examinations.                  CAD; Risk Factors:Hypertension and HCL.  Sonographer:     Humphrey Rolls RDCS (AE) Referring Phys:  [id number] [facility]  Diagnosing Phys: Arnoldo Hooker MD  Sonographer Comments: Suboptimal apical window and suboptimal subcostal window. TDS due to pt inability to follow breathing instructions during imaging. IMPRESSIONS  1. Left ventricular ejection fraction, by estimation, is 55 to 60%. The left ventricle has normal function. The left ventricle has no regional wall motion abnormalities. Left ventricular diastolic parameters were normal.  2. Right ventricular systolic function is normal. The right ventricular size is normal. There is normal pulmonary artery systolic pressure.  3. The mitral valve is normal in structure. Trivial mitral valve regurgitation.  4. The aortic valve is normal in structure. Aortic valve regurgitation is not visualized. FINDINGS  Left Ventricle: Left ventricular ejection fraction, by estimation, is 55 to 60%. The left ventricle has normal function. The left ventricle has no regional wall motion abnormalities. The left ventricular internal cavity size was normal in size. There is  no left ventricular hypertrophy. Left ventricular diastolic parameters were normal. Right Ventricle: The right ventricular size is normal. No increase in right ventricular wall thickness. Right ventricular systolic function is normal. There is normal pulmonary artery systolic pressure. The tricuspid regurgitant velocity is 2.09 m/s, and  with an assumed right atrial pressure of 10 mmHg, the estimated right ventricular systolic pressure is 27.5 mmHg. Left Atrium: Left atrial size was normal in size. Right Atrium: Right atrial size was normal in size. Pericardium: There is no evidence of pericardial effusion. Mitral Valve: The mitral valve is normal in structure. Trivial mitral valve regurgitation. MV peak gradient, 1.5 mmHg. The mean mitral valve gradient is 1.0 mmHg. Tricuspid Valve: The tricuspid valve is normal in structure. Tricuspid valve regurgitation is trivial. Aortic Valve: The aortic valve is normal in structure. Aortic valve  regurgitation is not visualized. Aortic valve mean gradient measures 3.0 mmHg. Aortic valve peak gradient measures 5.7 mmHg. Aortic valve area, by VTI measures 2.18 cm. Pulmonic Valve: The pulmonic valve was normal in structure. Pulmonic valve regurgitation is not visualized. Aorta: The aortic root and ascending aorta are structurally normal, with no evidence of dilitation. IAS/Shunts: No atrial level shunt detected by color flow Doppler.  LEFT VENTRICLE PLAX 2D LVIDd:         4.56 cm  Diastology LVIDs:         3.06 cm  LV e' lateral:   5.87 cm/s LV PW:  0.84 cm  LV E/e' lateral: 7.0 LV IVS:        0.96 cm LVOT diam:     1.80 cm LV SV:         40 LV SV Index:   22 LVOT Area:     2.54 cm  RIGHT VENTRICLE RV Basal diam:  3.70 cm LEFT ATRIUM           Index       RIGHT ATRIUM           Index LA diam:      3.40 cm 1.84 cm/m  RA Area:     15.70 cm LA Vol (A4C): 34.8 ml 18.83 ml/m RA Volume:   41.10 ml  22.24 ml/m  AORTIC VALVE                   PULMONIC VALVE AV Area (Vmax):    1.74 cm    PV Vmax:       1.08 m/s AV Area (Vmean):   2.07 cm    PV Vmean:      72.800 cm/s AV Area (VTI):     2.18 cm    PV VTI:        0.189 m AV Vmax:           119.00 cm/s PV Peak grad:  4.7 mmHg AV Vmean:          72.500 cm/s PV Mean grad:  2.0 mmHg AV VTI:            0.186 m AV Peak Grad:      5.7 mmHg AV Mean Grad:      3.0 mmHg LVOT Vmax:         81.30 cm/s LVOT Vmean:        58.900 cm/s LVOT VTI:          0.159 m LVOT/AV VTI ratio: 0.85  AORTA Ao Root diam: 3.20 cm MITRAL VALVE               TRICUSPID VALVE MV Area (PHT): 4.15 cm    TR Peak grad:   17.5 mmHg MV Peak grad:  1.5 mmHg    TR Vmax:        209.00 cm/s MV Mean grad:  1.0 mmHg MV Vmax:       0.60 m/s    SHUNTS MV Vmean:      44.8 cm/s   Systemic VTI:  0.16 m MV Decel Time: 183 msec    Systemic Diam: 1.80 cm MV E velocity: 41.20 cm/s MV A velocity: 66.00 cm/s MV E/A ratio:  0.62 Arnoldo HookerBruce Kowalski MD Electronically signed by Arnoldo HookerBruce Kowalski MD Signature Date/Time:  07/17/2020/4:40:19 PM    Final      Assessment/Plan: Fever, headache, painful rash on the rt side of the back and chest with radiating pain from back to front Herpes zoster- one-( may be two) dermatomal involvement- doe snot need airborne/ccontact/  Got IV  Acyclovir until 07/18/20 when switched to PO  valtrex 1 gram  TID until 9.16.21  No fever in 48 hrs  Encephalopathy- MRI with contrast normal Etiology multifactorial- NO encephalitis. She could have aspetic meningitis , ( she had refused LP on admission) Acyclovir can cause encephalopathy but she is now > 24 hrs off the medicine Oxycodone can cause confusion She also has change in diurnal rhythm with sleeping day time and awake at night Also OSA but not wearing CPAP- but no co2 narcosis as per  ABG Poor intake  would recommend Dc oxycodone Would recommend neurologist evaluation   ?she recently had a complicated stay at [facility] between 04/01/20 until 05/14/20 for abdominal pain which was diagnosed as choledocholithiasis/cholangitis in a setting of prior RYGB in 2008. 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   Rt sided abdominal firmness and fullness CT abdomen showsFluid collection at lies along the posterior margin of the pancreas, predominantly collecting along the right anterior pararenal fascia. This may reflect an abscess. It could be a pseudocyst if there is a history of pancreatitis. It may be a postoperative collection, since  the patient underwent a cholecystectomy since the prior CT. Collection measures approximately 14 x 4 x 10 cm in size. If she has fever it willl have to be aspirated and cultured   Urine culture multiple species-received 5 days of ceftriaxone  Hearing loss   Left TKA  Discussed the management with her daughter,hospitalist and her nurse

## 2020-07-20 ENCOUNTER — Encounter: Payer: Self-pay | Admitting: Internal Medicine

## 2020-07-20 ENCOUNTER — Inpatient Hospital Stay: Payer: Medicare Other

## 2020-07-20 DIAGNOSIS — R509 Fever, unspecified: Secondary | ICD-10-CM | POA: Diagnosis not present

## 2020-07-20 DIAGNOSIS — R4182 Altered mental status, unspecified: Secondary | ICD-10-CM

## 2020-07-20 DIAGNOSIS — R52 Pain, unspecified: Secondary | ICD-10-CM | POA: Diagnosis not present

## 2020-07-20 DIAGNOSIS — B029 Zoster without complications: Secondary | ICD-10-CM | POA: Diagnosis not present

## 2020-07-20 DIAGNOSIS — R569 Unspecified convulsions: Secondary | ICD-10-CM

## 2020-07-20 DIAGNOSIS — R519 Headache, unspecified: Secondary | ICD-10-CM | POA: Diagnosis not present

## 2020-07-20 DIAGNOSIS — G9341 Metabolic encephalopathy: Secondary | ICD-10-CM

## 2020-07-20 LAB — GLUCOSE, CAPILLARY
Glucose-Capillary: 128 mg/dL — ABNORMAL HIGH (ref 70–99)
Glucose-Capillary: 143 mg/dL — ABNORMAL HIGH (ref 70–99)
Glucose-Capillary: 130 mg/dL — ABNORMAL HIGH (ref 70–99)
Glucose-Capillary: 164 mg/dL — ABNORMAL HIGH (ref 70–99)
Glucose-Capillary: 157 mg/dL — ABNORMAL HIGH (ref 70–99)

## 2020-07-20 LAB — CBC WITH DIFFERENTIAL/PLATELET
Abs Immature Granulocytes: 0.03 10*3/uL (ref 0.00–0.07)
Basophils Absolute: 0 10*3/uL (ref 0.0–0.1)
Basophils Relative: 1 %
Eosinophils Absolute: 0.2 10*3/uL (ref 0.0–0.5)
Eosinophils Relative: 3 %
HCT: 32.3 % — ABNORMAL LOW (ref 36.0–46.0)
Hemoglobin: 10.8 g/dL — ABNORMAL LOW (ref 12.0–15.0)
Immature Granulocytes: 1 %
Lymphocytes Relative: 19 %
Lymphs Abs: 1.1 10*3/uL (ref 0.7–4.0)
MCH: 32.2 pg (ref 26.0–34.0)
MCHC: 33.4 g/dL (ref 30.0–36.0)
MCV: 96.4 fL (ref 80.0–100.0)
Monocytes Absolute: 0.7 10*3/uL (ref 0.1–1.0)
Monocytes Relative: 11 %
Neutro Abs: 4.1 10*3/uL (ref 1.7–7.7)
Neutrophils Relative %: 65 %
Platelets: 271 10*3/uL (ref 150–400)
RBC: 3.35 MIL/uL — ABNORMAL LOW (ref 3.87–5.11)
RDW: 13.4 % (ref 11.5–15.5)
WBC: 6.2 10*3/uL (ref 4.0–10.5)
nRBC: 0 % (ref 0.0–0.2)

## 2020-07-20 LAB — BASIC METABOLIC PANEL
Anion gap: 9 (ref 5–15)
BUN: 9 mg/dL (ref 8–23)
CO2: 25 mmol/L (ref 22–32)
Calcium: 8.8 mg/dL — ABNORMAL LOW (ref 8.9–10.3)
Chloride: 96 mmol/L — ABNORMAL LOW (ref 98–111)
Creatinine, Ser: 0.81 mg/dL (ref 0.44–1.00)
GFR calc Af Amer: >60 mL/min (ref >60)
GFR calc non Af Amer: >60 mL/min (ref >60)
Glucose, Bld: 134 mg/dL — ABNORMAL HIGH (ref 70–99)
Potassium: 3.7 mmol/L (ref 3.5–5.1)
Sodium: 130 mmol/L — ABNORMAL LOW (ref 135–145)

## 2020-07-20 LAB — URINALYSIS, ROUTINE W REFLEX MICROSCOPIC
Bilirubin Urine: NEGATIVE
Glucose, UA: NEGATIVE mg/dL
Hgb urine dipstick: NEGATIVE
Ketones, ur: NEGATIVE mg/dL
Leukocytes,Ua: NEGATIVE
Nitrite: NEGATIVE
Protein, ur: NEGATIVE mg/dL
Specific Gravity, Urine: 1.008 (ref 1.005–1.030)
pH: 6 (ref 5.0–8.0)

## 2020-07-20 LAB — MAGNESIUM: Magnesium: 1.9 mg/dL (ref 1.7–2.4)

## 2020-07-20 LAB — PROCALCITONIN: Procalcitonin: <0.1 ng/mL

## 2020-07-20 MED ORDER — TRAZODONE HCL 100 MG PO TABS
100.0000 mg | ORAL_TABLET | Freq: Every day | ORAL | Status: DC
  Filled 2020-07-20: qty 1

## 2020-07-20 MED ORDER — SODIUM CHLORIDE 0.9 % IV SOLN
100.0000 mg | Freq: Two times a day (BID) | INTRAVENOUS | Status: DC
  Administered 2020-07-20 – 2020-07-23 (×5): 100 mg via INTRAVENOUS
  Filled 2020-07-20 (×8): qty 100

## 2020-07-20 MED ORDER — IOHEXOL 300 MG/ML  SOLN
100.0000 mL | Freq: Once | INTRAMUSCULAR | Status: AC | PRN
  Administered 2020-07-20: 100 mL via INTRAVENOUS

## 2020-07-20 MED ORDER — TRAZODONE HCL 50 MG PO TABS
50.0000 mg | ORAL_TABLET | Freq: Every day | ORAL | Status: DC
  Administered 2020-07-20: 50 mg via ORAL

## 2020-07-20 MED ORDER — TRAZODONE HCL 50 MG PO TABS
25.0000 mg | ORAL_TABLET | Freq: Every day | ORAL | Status: DC
  Administered 2020-07-22 – 2020-07-24 (×3): 25 mg via ORAL
  Filled 2020-07-20 (×5): qty 1

## 2020-07-20 NOTE — Progress Notes (Signed)
EEG completed, results pending. 

## 2020-07-20 NOTE — Progress Notes (Signed)
PROGRESS NOTE    Casey Baldwin  MBW:[id number] DOB: 1940/12/03 DOA: 07/12/2020 PCP: Annice Needy, MD   Chief complaint.  Altered mental status. 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 Rash is intensely pruritic different stages of healing some areas of vesicular be red border. Some areas covered in black eschars. Skin is diffusely tender.Per family (asked by me) pt developed confusion. Was c/o side to back pain.  Sed rate was elevated. LP was considered but patient declined stating that she had a history of meningitis in the past and very scared of having LP done. Hospitalist was called for admission for shingles and possible encephalitis.  9/8.IV acyclovir was changed to Valtrex. Patient has not had a bowel movement since admission, has poor appetite. Giving lactulose, increase the Protonix to twice a day.  9/9.  Patient refused CPAP last night, she became more confused today.  She also has reversed sleeping cycle.  Added Seroquel 25 mg evening.  Schedule lactulose twice a day for constipation.  9/10.  Patient slept last night with Seroquel.  Still very confused and fatigued.  Obtain neurology consult.    Assessment & Plan:   Active Problems:   SIRS (systemic inflammatory response syndrome) (HCC)   Shingles rash   Delirium   Acute metabolic encephalopathy   Essential hypertension   Anemia   Acute lower UTI   Chronic diastolic CHF (congestive heart failure) ([place])   Sepsis ([place])   Change in mental status  #1.  Sepsis. Secondary to UTI and a viral infection. Sepsis  has resolved.  #2.  Shingles with aseptic meningitis. Completed 5days of acyclovir IV, currently on Valtrex, followed by ID.  3.  Acute metabolic encephalopathy. Patient has been confused for the last few days.  She has not been compliant with her CPAP, however, CO2 level not elevated.  Start Seroquel yesterday, slept better, still feel very weak and sleepy today.  Confused.  Patient condition does not seem to be improving, will obtain neurology consult. Suspect the patient may have developed significant dementia with delirium. Continue Seroquel 25 mg every evening. We will consider add Remeron 15 mg every evening for appetite if condition does not improve with Seroquel.  4.  Anorexia and constipation. Patient had some watery stool yesterday.  This happened after he taking lactulose as well as enema.  She was fed yesterday, appetite still poor today.  Continue scheduled lactulose and Protonix. I will continue some IV fluids to keep her rehydrated.  5.  Essential hypertension. Continue beta-blocker.  6.  Obstructive sleep apnea. Patient is still not compliant with CPAP.  7.  Urinary tract infection. Antibiotic completed.  Repeated UA negative.  8.  Chronic diastolic congestive heart failure. Stable.  No evidence of volume overload.  9.  Type 2 diabetes. Controlled.    DVT prophylaxis: Lovenox Code Status: Full Family Communication: Met patient daughter twice today.  .   Status is: Inpatient  Remains inpatient appropriate because:Inpatient level of care appropriate due to severity of illness   Dispo: The patient is from: Home              Anticipated d/c  is to: ?              Anticipated d/c date is: > 3 days              Patient currently is not medically stable to d/c.        I/O last 3 completed shifts: In: 625.5 [I.V.:625.5] Out: 100 [Urine:100] No intake/output data recorded.     Consultants:   ID and neurology  Procedures: None  Antimicrobials:  Valtrex  Subjective: Patient slept last night, very confused this morning.  She had a better appetite yesterday, refused to eat this morning.  No nausea vomiting abdominal pain.  She had some watery stool after giving lactulose and enema.  Significantly confused. Denies any short of breath or cough. No fever chills. She has some dysuria, but UA negative for UTI.    Objective: Vitals:   07/19/20 2257 07/19/20 2257 07/20/20 0109 07/20/20 0839  BP: (!) 155/86 (!) 155/86 (!) 131/98 (!) 159/88  Pulse: 87 87 84 90  Resp:   16 16  Temp:   98.4 F (36.9 C) 98.8 F (37.1 C)  TempSrc:   Oral Oral  SpO2:   99% 100%  Weight:      Height:        Intake/Output Summary (Last 24 hours) at 07/20/2020 1247 Last data filed at 07/20/2020 0300 Gross per 24 hour  Intake 625.51 ml  Output 100 ml  Net 525.51 ml   Filed Weights   07/12/20 0918 07/12/20 0921  Weight: 79.4 kg 79.4 kg    Examination:  General exam: Appears calm and comfortable  Respiratory system: Clear to auscultation. Respiratory effort normal. Cardiovascular system: S1 & S2 heard, RRR. No JVD, murmurs, rubs, gallops or clicks. No pedal edema. Gastrointestinal system: Abdomen is nondistended, soft and nontender. No organomegaly or masses felt. Normal bowel sounds heard. Central nervous system: Alert and oriented x1. No focal neurological deficits. Extremities: Symmetric  Skin: No rashes, lesions or ulcers .     Data Reviewed: I have personally reviewed following labs and imaging studies  CBC: Recent Labs  Lab 07/16/20 1736 07/17/20 0928 07/19/20 0533 07/20/20 0448  WBC 6.4 8.0 6.9 6.2  NEUTROABS  --   --  4.9 4.1  HGB 11.7* 11.5* 11.2* 10.8*  HCT 33.8* 34.1* 32.7* 32.3*  MCV 94.2 96.9 93.7 96.4  PLT 297 305 280 127   Basic Metabolic Panel: Recent Labs  Lab 07/15/20 0935 07/16/20 1915 07/17/20 0928 07/19/20 0533 07/20/20 0448  NA 132* 132* 132* 131* 130*  K 3.8 3.3* 3.8 3.4* 3.7  CL 97* 95* 96* 96* 96*    CO2 _0 GLUCOSE 112* 120* 133* 130* 134*  BUN _1 CREATININE 0.91 0.77 0.81 0.76 0.81  CALCIUM 8.8* 8.6* 8.9 8.8* 8.8*  MG  --   --   --  2.1 1.9  PHOS  --   --   --  3.3  --    GFR: Estimated Creatinine Clearance: 56.5 mL/min (by C-G formula based on SCr of 0.81 mg/dL). Liver Function Tests: Recent Labs  Lab 07/16/20 1915  AST 18  ALT 13  ALKPHOS 64  BILITOT 0.7  PROT 6.8  ALBUMIN 3.4*   No results for input(s): LIPASE, AMYLASE in the last 168 hours. No results for input(s): AMMONIA in the last 168 hours. Coagulation Profile: No results for input(s): INR, PROTIME in the last 168 hours. Cardiac Enzymes: No results for  input(s): CKTOTAL, CKMB, CKMBINDEX, TROPONINI in the last 168 hours. BNP (last 3 results) No results for input(s): PROBNP in the last 8760 hours. HbA1C: No results for input(s): HGBA1C in the last 72 hours. CBG: Recent Labs  Lab 07/19/20 2032 07/19/20 2341 07/20/20 0419 07/20/20 0902 07/20/20 1136  GLUCAP 93 118* 128* 143* 130*   Lipid Profile: No results for input(s): CHOL, HDL, LDLCALC, TRIG, CHOLHDL, LDLDIRECT in the last 72 hours. Thyroid Function Tests: No results for input(s): TSH, T4TOTAL, FREET4, T3FREE, THYROIDAB in the last 72 hours. Anemia Panel: No results for input(s): VITAMINB12, FOLATE, FERRITIN, TIBC, IRON, RETICCTPCT in the last 72 hours. Sepsis Labs: No results for input(s): PROCALCITON, LATICACIDVEN in the last 168 hours.  Recent Results (from the past 240 hour(s))  SARS Coronavirus 2 by RT PCR (hospital order, performed in [facility] hospital lab) Nasopharyngeal Nasopharyngeal Swab     Status: None   Collection Time: 07/10/20  2:48 PM   Specimen: Nasopharyngeal Swab  Result Value Ref Range Status   SARS Coronavirus 2 NEGATIVE NEGATIVE Final    Comment: (NOTE) SARS-CoV-2 target nucleic acids are NOT DETECTED.  The SARS-CoV-2 RNA is generally detectable in upper and lower respiratory specimens during  the acute phase of infection. The lowest concentration of SARS-CoV-2 viral copies this assay can detect is 250 copies / mL. A negative result does not preclude SARS-CoV-2 infection and should not be used as the sole basis for treatment or other patient management decisions.  A negative result may occur with improper specimen collection / handling, submission of specimen other than nasopharyngeal swab, presence of viral mutation(s) within the areas targeted by this assay, and inadequate number of viral copies (<250 copies / mL). A negative result must be combined with clinical observations, patient history, and epidemiological information.  Fact Sheet for Patients:   BoilerBrush.com.cy  Fact Sheet for Healthcare Providers: https://pope.com/  This test is not yet approved or  cleared by the [place] FDA and has been authorized for detection and/or diagnosis of SARS-CoV-2 by FDA under an Emergency Use Authorization (EUA).  This EUA will remain in effect (meaning this test can be used) for the duration of the COVID-19 declaration under Section 564(b)(1) of the Act, 21 U.S.C. section 360bbb-3(b)(1), unless the authorization is terminated or revoked sooner.  Performed at [facility], [address] [place], [place] [address]   Urine culture     Status: Abnormal   Collection Time: 07/12/20  9:23 PM   Specimen: Urine, Random  Result Value Ref Range Status   Specimen Description   Final    URINE, RANDOM Performed at [facility], [address], [place], [place] [address]    Special Requests   Final    NONE Performed at [facility], [address] [place], [place] [address]    Culture MULTIPLE SPECIES PRESENT, SUGGEST RECOLLECTION (A)  Final   Report Status 07/15/2020 FINAL  Final  Culture, blood (routine x 2)     Status: None   Collection Time: 07/12/20  9:23 PM   Specimen: BLOOD  Result Value  Ref Range Status   Specimen Description BLOOD RIGHT AC  Final   Special Requests   Final    BOTTLES DRAWN AEROBIC AND ANAEROBIC Blood Culture results may not be optimal due to an excessive volume of blood received in culture bottles   Culture   Final    NO GROWTH 5 DAYS Performed at [facility], [address] [place],  [place] [address]    Report Status 07/17/2020 FINAL  Final  Culture, blood (routine x 2)     Status: None   Collection Time: 07/12/20  9:23 PM   Specimen: BLOOD  Result Value Ref Range Status   Specimen Description BLOOD RIGHT WRIST  Final   Special Requests   Final    BOTTLES DRAWN AEROBIC AND ANAEROBIC Blood Culture adequate volume   Culture   Final    NO GROWTH 5 DAYS Performed at [facility], [address] [place], [place] [address]    Report Status 07/17/2020 FINAL  Final  SARS Coronavirus 2 by RT PCR (hospital order, performed in [facility] hospital lab) Nasopharyngeal Nasopharyngeal Swab     Status: None   Collection Time: 07/12/20 11:13 PM   Specimen: Nasopharyngeal Swab  Result Value Ref Range Status   SARS Coronavirus 2 NEGATIVE NEGATIVE Final    Comment: (NOTE) SARS-CoV-2 target nucleic acids are NOT DETECTED.  The SARS-CoV-2 RNA is generally detectable in upper and lower respiratory specimens during the acute phase of infection. The lowest concentration of SARS-CoV-2 viral copies this assay can detect is 250 copies / mL. A negative result does not preclude SARS-CoV-2 infection and should not be used as the sole basis for treatment or other patient management decisions.  A negative result may occur with improper specimen collection / handling, submission of specimen other than nasopharyngeal swab, presence of viral mutation(s) within the areas targeted by this assay, and inadequate number of viral copies (<250 copies / mL). A negative result must be combined with clinical observations, patient history, and epidemiological  information.  Fact Sheet for Patients:   StrictlyIdeas.no  Fact Sheet for Healthcare Providers: BankingDealers.co.za  This test is not yet approved or  cleared by the [place] FDA and has been authorized for detection and/or diagnosis of SARS-CoV-2 by FDA under an Emergency Use Authorization (EUA).  This EUA will remain in effect (meaning this test can be used) for the duration of the COVID-19 declaration under Section 564(b)(1) of the Act, 21 U.S.C. section 360bbb-3(b)(1), unless the authorization is terminated or revoked sooner.  Performed at [facility], [address] [place], [place] [address]   MRSA PCR Screening     Status: None   Collection Time: 07/13/20  2:02 AM   Specimen: Nasal Mucosa; Nasopharyngeal  Result Value Ref Range Status   MRSA by PCR NEGATIVE NEGATIVE Final    Comment:        The GeneXpert MRSA Assay (FDA approved for NASAL specimens only), is one component of a comprehensive MRSA colonization surveillance program. It is not intended to diagnose MRSA infection nor to guide or monitor treatment for MRSA infections. Performed at [facility], [address] [place],  [address]   CULTURE, BLOOD (ROUTINE X 2) w Reflex to ID Panel     Status: None (Preliminary result)   Collection Time: 07/16/20  5:36 PM   Specimen: BLOOD  Result Value Ref Range Status   Specimen Description BLOOD [facility]  Final   Special Requests   Final    BOTTLES DRAWN AEROBIC ONLY Blood Culture adequate volume   Culture   Final    NO GROWTH 4 DAYS Performed at [facility], [address] [place],  [address]    Report Status PENDING  Incomplete  CULTURE, BLOOD (ROUTINE X 2) w Reflex to ID Panel     Status: None (Preliminary result)   Collection Time: 07/16/20  5:36  PM   Specimen: BLOOD  Result Value Ref Range Status   Specimen Description BLOOD [facility]  Final   Special Requests   Final      BOTTLES DRAWN AEROBIC AND ANAEROBIC Blood Culture adequate volume   Culture   Final    NO GROWTH 4 DAYS Performed at [facility], [address], [place], [place] [address]    Report Status PENDING  Incomplete         Radiology Studies: No results found.      Scheduled Meds: . docusate sodium  100 mg Oral BID  . enoxaparin (LOVENOX) injection  40 mg Subcutaneous Q24H  . feeding supplement (ENSURE ENLIVE)  237 mL Oral BID BM  . Ferrous Fumarate  1 tablet Oral BID  . gabapentin  100 mg Oral BID  . insulin aspart  0-9 Units Subcutaneous Q4H  . lactulose  20 g Oral BID  . metoprolol tartrate  12.5 mg Oral BID  . multivitamin with minerals  1 tablet Oral Daily  . pantoprazole  20 mg Oral BID  . QUEtiapine  25 mg Oral QHS  . rosuvastatin  20 mg Oral Daily  . sodium chloride flush  3 mL Intravenous Q12H  . valACYclovir  1,000 mg Oral TID   Continuous Infusions: . dextrose 5 % and 0.9 % NaCl with KCl 20 mEq/L 50 mL/hr at 07/20/20 1227     LOS: 7 days    Time spent: 35 minutes    Sharen Hones, MD [facility]   To contact the attending provider between 7A-7P or the covering provider during after hours 7P-7A, please log into the web site [email or web address] and access using universal [place] password for that web site. If you do not have the password, please call the hospital operator.  07/20/2020, 12:47 PM

## 2020-07-20 NOTE — Procedures (Signed)
.  Patient Name: Leane Loring  MRN: [id number]  Epilepsy Attending: Charlsie Quest  Referring Physician/Provider: Dr Caryl Pina Date: 07/20/2020 Duration: 23.57 mins  Patient history: 80 year old female with AMS in the context of multiple medical comorbidities. EEG to evaluate for seizure  Level of alertness: Awake  AEDs during EEG study: None  Technical aspects: This EEG study was done with scalp electrodes positioned according to the 10-20 International system of electrode placement. Electrical activity was acquired at a sampling rate of 500Hz  and reviewed with a high frequency filter of 70Hz  and a low frequency filter of 1Hz . EEG data were recorded continuously and digitally stored.   Description: No posterior dominant rhythm was seen. EEG showed continuous generalized 3 to 6 Hz theta-delta slowing.  Hyperventilation and photic stimulation were not performed.     ABNORMALITY -Continuous slow, generalized  IMPRESSION: This study is suggestive of moderate diffuse encephalopathy, nonspecific etiology. No seizures or epileptiform discharges were seen throughout the recording.  Selvin Yun 

## 2020-07-20 NOTE — Progress Notes (Signed)
Patient refusing meds and food. Told on coming nurses regarding NPO orders for midnight and hold all blood thinners

## 2020-07-20 NOTE — Progress Notes (Signed)
Nutrition Follow-up  DOCUMENTATION CODES:   Not applicable  INTERVENTION:  Continue Ensure Enlive po BID, each supplement provides 350 kcal and 20 grams of protein  Magic cup TID with meals, each supplement provides 290 kcal and 9 grams of protein  Continue MVI with minerals daily  Continue to monitor K, Mg, and P labs daily   NUTRITION DIAGNOSIS:   Inadequate oral intake related to acute illness as evidenced by other (comment) (per chart review). -ongoing  GOAL:   Patient will meet greater than or equal to 90% of their needs -progressing  MONITOR:   PO intake, Supplement acceptance, Labs, Weight trends, Skin, I & O's  REASON FOR ASSESSMENT:   Consult Assessment of nutrition requirement/status  ASSESSMENT:   79 y/o female with h/o CHF, HLD, HTN, OSA, DM, Roux-en-y 2010, Lap band 2008, cholecystitis s/p (diagnostic laparoscopy, robotic lysis of adhesions, transgastric ERCP and biliary sphincterotomy, cholecystectomy, partial gastrectomy and colotomy 5/27) and colon resection for diverticulitis who is admitted with UTI, SIRS, shingles and encephalitis  Patient sleeping soundly this morning, briefly opened eyes to name call, subsequently falling back asleep. Observed untouched breakfast tray at bedside. Ongoing poor appetite, eating 25-50% of meals, noted refusal of the last 4 Ensure supplements, will add Magic Cup with meals. Consider appetite stimulant.   No new weights this admission, will order daily weights to assess trends.   Per notes, pt has reversed sleeping cycle, Seroquel 25 mg nightly added on 9/09, slept well overnight, however still very confused and fatigued today, neurology consulted.  Medications reviewed and include: Colace, Ferrous fumarate, Gabapentin, Lactulose, SSI, MVI, Valtrex IVF: D5 and NaCl with KCl 20 mEq/L @ 50 ml/hr (204 kcal) Labs: CBGs 130,143,128,118,93 x 24 hrs, Na 130 (L), K 3.7 (WNL), Mg 1.9 (L) trending down, Hgb 10.8 (L), HCT 32.3  (L)  NUTRITION - FOCUSED PHYSICAL EXAM: Unable to perform at this time, pt lethargic   Diet Order:   Diet Order            Diet regular Room service appropriate? Yes; Fluid consistency: Thin  Diet effective now                 EDUCATION NEEDS:   Not appropriate for education at this time  Skin:  Skin Assessment: Reviewed RN Assessment (ecchymosis)  Last BM:  9/10-type 7 s/p lacutlose enema  Height:   Ht Readings from Last 1 Encounters:  07/12/20 5\' 4"  (1.626 m)    Weight:   Wt Readings from Last 1 Encounters:  07/12/20 79.4 kg    Ideal Body Weight:  54.5 kg  BMI:  Body mass index is 30.04 kg/m.  Estimated Nutritional Needs:   Kcal:  1700-1900kcal/day  Protein:  85-95g/day  Fluid:  1.6L/day   09/11/20, RD, LDN Clinical Nutrition After Hours/Weekend Pager # in Amion

## 2020-07-20 NOTE — Progress Notes (Signed)
Pt lethargic , non verbal On tickling her feet she wakes up and asks me to stop it. She then was able to say she did not want to eat. She said she did not have any headache Patient Vitals for the past 24 hrs:  BP Temp Temp src Pulse Resp SpO2  07/20/20 2011 (!) 160/85 98.4 F (36.9 C) Oral 80 18 100 %  07/20/20 1613 134/88 100.2 F (37.9 C) Oral 82 16 95 %  07/20/20 0839 (!) 159/88 98.8 F (37.1 C) Oral 90 16 100 %  07/20/20 0109 (!) 131/98 98.4 F (36.9 C) Oral 84 16 99 %  07/19/20 2257 (!) 155/86 -- -- 87 -- --  07/19/20 2257 (!) 155/86 -- -- 87 -- --   Chest b/l air entry Lesion son the rt side of the back are scabbing. In the front on her breast there is a circular lesion [id number] abd soft Moves all her limbs spontaneously   Labs CBC Latest Ref Rng & Units 07/20/2020 07/19/2020 07/17/2020  WBC 4.0 - 10.5 K/uL 6.2 6.9 8.0  Hemoglobin 12.0 - 15.0 g/dL 10.8(L) 11.2(L) 11.5(L)  Hematocrit 36 - 46 % 32.3(L) 32.7(L) 34.1(L)  Platelets 150 - 400 K/uL 271 280 305    CMP Latest Ref Rng & Units 07/20/2020 07/19/2020 07/17/2020  Glucose 70 - 99 mg/dL 573(U) 202(R) 427(C)  BUN 8 - 23 mg/dL 9 11 14   Creatinine 0.44 - 1.00 mg/dL 6.23 7.62  Sodium 135 - 145 mmol/L 130(L) 131(L) 132(L)  Potassium 3.5 - 5.1 mmol/L 3.7 3.4(L) 3.8  Chloride 98 - 111 mmol/L 96(L) 96(L) 96(L)  CO2 22 - 32 mmol/L 25 23 24   Calcium 8.9 - 10.3 mg/dL 8.31) ) 8.9  Total Protein 6.5 - 8.1 g/dL - - -  Total Bilirubin 0.3 - 1.2 mg/dL - - -  Alkaline Phos 38 - 126 U/L - - -  AST 15 - 41 U/L - - -  ALT 0 - 44 U/L - - -    Fever, headache, painful rash on the rt side of the back and chest with radiating pain from back to front Herpes zoster- one-( may be two) dermatomal involvement- doe snot need airborne/ccontact/  Got IV  Acyclovir until 07/18/20 when switched to PO  valtrex 1 gram  TID until 9.16.21  Low grade fever Will start Doxycycline for TBI like ehrlichia as was working in the yard as per her  daughter. No obvious tick bites, no leucopenia or thrombocytopenia . But has encephalopathy and received a dose of doxy on 9/2 . So could have been partially treated. So will start and observe closely  Encephalopathy- MRI with contrast normal Etiology multifactorial- NO encephalitis. She could have aspetic meningitis ,( she had refused LP on admission) Acyclovir can cause encephalopathy but she is now > 24 hrs off the medicine Oxycodone can cause confusion She also has change in diurnal rhythm with sleeping day time and awake at night Also OSA but not wearing CPAP- but no co2 narcosis as per ABG Poor intake   Oxycodone discontinued Appreciate neurologist evaluation. Spoke with Dr.Lindzen- delirium is the working diagnosis Would like to rule out intra-abdominal infection She has a collection thought to be psedocyst VS seroma VS bilioma secondary to a recent complicated stay at [facility] between 04/01/20 until 05/14/20 for abdominal pain which was diagnosed as choledocholithiasis/cholangitis in a setting of prior RYGB in 2008. 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 and she was taken to OR  on 04/05/20 for diagnostic lap, robotic lysis of adhesions, transgastric ERCP and biliary sphincterotomy, cholecystectomy, partial gastrectomy and accidental colotomy which was repaired.The immediate post op was complicated by post ERCP pancreatitis , pulmonary edema with acute hypoxic resp failure , Afib ( treated with amio and metoprolol), fever and leucocytosis suspected due to pancreatitis and was treated with zosyn, UTI due to proteus and acute on chronic anemia needing PRBC and AKI. She was seen by gerontologist for insomnia and risk for delirium  Rt sided abdominal firmness and fullness CT abdomen showsFluid collection at lies along the posterior margin of the pancreas, predominantly collecting along the right anterior pararenal fascia. This may reflect an abscess. It could be a pseudocyst if there is  a history of pancreatitis. It may be a postoperative collection, since the patient underwent a cholecystectomy since the prior CT. Collection measures approximately 14 x 4 x 10 cm in size. Because of ongoing delirium need to r/o infection Discussed with neurologist and then with IR Dr.Henn. he asked the CT scan be repeated and he would let the IR during the weekend do the aspiration- Fluid to be sent for cell clount, amylase, bacteria culture and fungal culture   Urine culture multiple species-received 5 days of ceftriaxone  Hearing loss   Left TKA  Discussed the management with the hospitalist, neurologist, IR and her nurse

## 2020-07-20 NOTE — Consult Note (Addendum)
NEURO HOSPITALIST CONSULT NOTE   Requestig physician: Dr. Roosevelt Locks  Reason for Consult: AMS in the setting of shingles and aseptic meningitis  History obtained from:  Daughter and Chart     HPI:                                                                                                                                          Casey Baldwin is an 79 y.o. 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 Temperature was 101 by EMS. She was alert and oriented x 3 on initial evaluation by ED staff, able to ambulate independently. It was noted that she had 4-5 lesions on her back and one dime-sized red mark on her right breast that were thought initially to be insect bites, but were later revealed to be more consistent with shingles lesions. Her daughter felt that the patient "was not thinking quite clearly" - for example, the patient "thought she saw her son out in the lobby and he is in [place] she is perhaps not made as much sense that she should have been". EDP also noted that she saw him several times on the 31st but did not recognize him on her return visit. Sed rates had been elevated and continuing to rise. On Hospitalist exam, it was noted that the rash was intensely pruritic, in different stages of healing with some areas appearing vesicular with a red  border and some areas covered in black eschars; her skin was diffusely tender. Multidermatomal shingles was diagnosed based on clinical features. Exam also revealed no focal neurological abnormality and her neck was supple. She was thought to possibly have a possible meningoencephalitis due to the headache and AMS, but initially refused LP. She was started on vancomycin and Rocephin. Also was treated with IV acyclovir both for her shingles as well as possible zoster-induced encephalitis. MRI brain revealed no acute abnormality.   ID consult was obtained and agreed with the diagnosis of shingles as well as the possibility of zoster meningitis due to her fever and headache. ID agreed with Hospitalist that her presentation was not consistent with a bacterial meningitis. Again, the patient refused LP. IV acyclovir  was continued for 5 days, then switched to Valtrex on 9/8 (to be continued until 9/16). Vancomycin and ceftriaxone were stopped.   Her mentation had improved, but the patient continued to have intermittent spells of confusion during her stay. Of note, her diurnal cycle became deranged during hospitalization, with RN noting on 9/7: "apparently patient has been sleeping throughout day and has been up watching tv and active at night. This night she has been up until around 4am. At that time BI-PAP applied and she is currently sound asleep." Of note, she was started on Neurontin for shingles-related pain. On 9/8 ID documented that they felt her presentation was not due to an encephalitis, but that she could have an aseptic meningitis. ID felt that an acyclovir-induced encephalopathy was unlikely as she has good renal function. Norco was thought to be a possible etiology for her confusion, in addition to change to her diurnal rhythm. Confusion not due to CO2 narcosis, per ABG.    Of note, CT abdomen to evaluate right sided abdominal firmness and fullness revealed a fluid collection along the posterior margin  of the pancreas, possibly reflecting an abscess. ID felt that if she spiked a fever, the fluid collection would need to aspirated and cultured.   She was noted to be more confused yesterday (9/9) after not wearing her CPAP as directed overnight for her OSA.    Past Medical History:  Diagnosis Date  . Coronary artery disease   . High cholesterol   . Hypertension     Past Surgical History:  Procedure Laterality Date  . ABDOMINAL HYSTERECTOMY    . COLON RESECTION    . FACIAL COSMETIC SURGERY    . FRACTURE SURGERY     left leg and left ankle.  Marland Kitchen GASTRIC BYPASS    . left knee replacement      Family History  Problem Relation Age of Onset  . Colon cancer Mother               Social History:  reports that she has never smoked. She has never used smokeless tobacco. She reports that she does not drink alcohol and does not use drugs.  Allergies  Allergen Reactions  . Lidocaine Anaphylaxis and Other (See Comments)    Became unconscious with administration for dental procedure at age 10yo. Pt tolerated subsequent lidocaine.    . Amlodipine Other (See Comments)    Hand and leg cramping  . Lactose Diarrhea  . Lactose Intolerance (Gi)   . Latex     MEDICATIONS:                                                                                                                     Scheduled: . docusate sodium  100 mg Oral BID  . enoxaparin (LOVENOX) injection  40 mg Subcutaneous Q24H  . feeding supplement (ENSURE ENLIVE)  237 mL Oral BID BM  . Ferrous Fumarate  1 tablet Oral BID  . gabapentin  100 mg Oral BID  .  insulin aspart  0-9 Units Subcutaneous Q4H  . lactulose  20 g Oral BID  . metoprolol tartrate  12.5 mg Oral BID  . multivitamin with minerals  1 tablet Oral Daily  . pantoprazole  20 mg Oral BID  . QUEtiapine  25 mg Oral QHS  . rosuvastatin  20 mg Oral Daily  . sodium chloride flush  3 mL Intravenous Q12H  . valACYclovir  1,000 mg Oral TID   Continuous: . dextrose 5 %  and 0.9 % NaCl with KCl 20 mEq/L 50 mL/hr at 07/20/20 1227     ROS:                                                                                                                                       Unable to obtain from patient due to [place].    Blood pressure (!) 159/88, pulse 90, temperature 98.8 F (37.1 C), temperature source Oral, resp. rate 16, height 5' 4" (1.626 m), weight 79.4 kg, SpO2 100 %.   General Examination:                                                                                                       Physical Exam  HEENT-  [place]/AT. No nuchal rigidity. Forehead is warm to touch suggestive of possible fever.    Lungs- Respirations unlabored.  Abdomen- RUQ TTP with questionable firmness and fullness.  Skin- Shingles rash to right chest noted.   Neurological Examination Mental Status: Drowsy with waxing/waning level of consciousness. Will fixate on examiner's face after noxious stimuli but will answer almost no questions and followed almost no commands. When noxious plantar stimulation is applied, she becomes more awake and alert, exclaiming with a fluent phrase. She is not cooperative with testing of naming or repetition. Did not appear to make an attempt to state the year, day of the week or city when asked. Limited speech output is non-dysarthric. Becomes slightly agitated when aroused.   Cranial Nerves: II: Did not cooperate with testing of visual fields, but able to fixate on examiner's face briefly. PERRL.   III,IV, VI: No ptosis. Eyes conjugate. Spontaneous EOM appear normal. Not cooperative with formal testing of EOM.  V,VII: Reacts when face is touched bilaterally. Face symmetric at rest and during speech.  VIII: Hearing intact to voice IX,X: Unable to assess formally, but no hypophonia or hoarseness.  XI: Head is midline.  XII: midline tongue extension Motor: Does not cooperate with testing, but withdraws BLE  vigorously to noxious plantar stimulation.   Will spontaneously move each upper extremity purposefully (e.g. when scratching her nose) Sensory: Reacts to touch and noxious x 4.  Deep Tendon Reflexes: 2+ brachioradialis and patellae bilaterally. Exclaims with discomfort when reflexes are elicited.  Plantars: Right: downgoing   Left: downgoing Cerebellar: Not cooperative with testing. No gross ataxia seen with spontaneous movements.  Gait: Unable to assess   Lab Results: Basic Metabolic Panel: Recent Labs  Lab 07/15/20 0935 07/15/20 0935 07/16/20 1915 07/16/20 1915 07/17/20 0928 07/19/20 0533 07/20/20 0448  NA 132*  --  132*  --  132* 131* 130*  K 3.8  --  3.3*  --  3.8 3.4* 3.7  CL 97*  --  95*  --  96* 96* 96*  CO2 23  --  25  --  _0 GLUCOSE 112*  --  120*  --  133* 130* 134*  BUN 15  --  15  --  _1 CREATININE 0.91  --  0.77  --  0.81 0.76 0.81  CALCIUM 8.8*   < > 8.6*   < > 8.9 8.8* 8.8*  MG  --   --   --   --   --  2.1 1.9  PHOS  --   --   --   --   --  3.3  --    < > = values in this interval not displayed.    CBC: Recent Labs  Lab 07/16/20 1736 07/17/20 0928 07/19/20 0533 07/20/20 0448  WBC 6.4 8.0 6.9 6.2  NEUTROABS  --   --  4.9 4.1  HGB 11.7* 11.5* 11.2* 10.8*  HCT 33.8* 34.1* 32.7* 32.3*  MCV 94.2 96.9 93.7 96.4  PLT 297 305 280 271    Cardiac Enzymes: No results for input(s): CKTOTAL, CKMB, CKMBINDEX, TROPONINI in the last 168 hours.  Lipid Panel: No results for input(s): CHOL, TRIG, HDL, CHOLHDL, VLDL, LDLCALC in the last 168 hours.  Imaging: No results found.   Assessment: 79 year old female with AMS in the context of multiple medical comorbidities.  1. Exam reveals findings most consistent with a delirium.  2. Vitamin B12 1048. WBC 6.2. AST and ALT normal. eGFR normal. Mg normal. Ca unremarkable. BUN and Cr normal. Na mildly decreased at 130. ESR elevated at 48. TSH elevated at 5.998. RMSF IgG negative. 3. MRI 9/2: No acute abnormality. No abnormal enhancement. Mild symmetric  bilateral anterior frontal lobe atrophy, mild bilateral perisylvian atrophy and mild bilateral hippocampal atrophy are noted on review of images by Neurology.   4. DDx for her delirium includes multifactorial, with change in her circadian rhythm, hospital delirium, possible right peripancreatic abscess, and possible chronic vitamin deficiency from remote gastric bypass and aseptic meningitis. Confusion not due to CO2 narcosis, per ABG. No history concerning for seizures.  5. Given her MRI findings, she may have a subclinical early dementing process; the findings on MRI could reflect an incipient Alzheimer's or frontotemporal degenerative process (FTLD), but cannot be used to diagnose a dementia, which requires detailed cognitive testing while in a healthy state. However, the brain atrophy could reflect decreased neurological reserve, which in turn could predispose to a delirium in the context of intercurrent illness.   6. Agree with IDt that an acyclovir-induced encephalopathy is unlikely as she has good renal function.   Recommendations: 1. Thiamine level (ordered). Will defer to primary team for ordering of the following additional components of the vitamin/mineral panel  to assess for specific deficiencies that can occur following Roux-en-Y gastric bypass: Zinc, vitamins A, E, D and B1.  2. Ensure that patient wears BIPAP at night.  3. Hold Neurontin.  4. Consider stopping Seroquel and switching to Trazodone 100 mg po qhs for sleep, as the latter is less likely to have AMS as a side effect.  5. Of note, CT abdomen to evaluate right sided abdominal firmness and fullness revealed a fluid collection along the posterior margin of the pancreas, possibly reflecting an abscess. ID felt that if she spiked a fever, the fluid collection would need to aspirated and cultured. On today's exam, she feels quite warm to touch, suggesting that she has spiked another fever. Would obtain a rectal temperature and if  elevated, call ID to discuss again the risks/benefits of obtaining a radiologically-guided aspirate from the peripancreatic fluid collection.  6. EEG  Addendum: EEG showed no epileptiform discharges. Continuous generalized slowing was present, consistent with a moderate diffuse encephalopathy, nonspecific to etiology.     Electronically signed: Dr. Kerney Elbe 07/20/2020, 11:59 AM

## 2020-07-20 NOTE — Progress Notes (Signed)
79 y.o, female inpatient. History of CHF, HTN, HLD, Rouex -en -Y on 4.1.21, DM presented to the the ED with generalized body aches, headaches, SHOB, right sided abdominal, a rash and AMS. CT abd pelvis from 9.4.21 reads Fluid collection at lies along the posterior margin of the pancreas, predominantly collecting along the right anterior pararenal fascia. This may reflect an abscess. It could be a pseudocyst if there is a history of pancreatitis. It may be a postoperative collection, since the patient underwent a cholecystectomy since the prior CT. Collection measures approximately 14 x 4 x 10 cm in size Team is requesting aspiration of the fluid collection for diagnostic testing.    IR consulted for possible Intra abdominal abscess drain placement - aspiration. Case has been reviewed by Dr. Archer Asa.  Team is requested to obtain a CT abd pelvis for further determination of procedure. Patient tentatively scheduled for 9.10.21.  Team instructed to: Keep Patient to be NPO after midnight Hold prophylactic anticoagulation 24 hours prior to scheduled procedure. Obtain CT abd pelivs  IR will call patient when ready.  Risks and benefits discussed with the patienr's daughter including bleeding, infection, damage to adjacent structures, bowel perforation/fistula connection, and sepsis.  All of the patient'sdaughter's questions were answered, patient is agreeable to proceed. Consent signed and in chart.

## 2020-07-21 ENCOUNTER — Inpatient Hospital Stay: Payer: Medicare Other

## 2020-07-21 DIAGNOSIS — R630 Anorexia: Secondary | ICD-10-CM

## 2020-07-21 LAB — BASIC METABOLIC PANEL
Anion gap: 9 (ref 5–15)
BUN: 12 mg/dL (ref 8–23)
CO2: 23 mmol/L (ref 22–32)
Calcium: 9.3 mg/dL (ref 8.9–10.3)
Chloride: 99 mmol/L (ref 98–111)
Creatinine, Ser: 0.96 mg/dL (ref 0.44–1.00)
GFR calc Af Amer: >60 mL/min (ref >60)
GFR calc non Af Amer: 56 mL/min — ABNORMAL LOW (ref >60)
Glucose, Bld: 160 mg/dL — ABNORMAL HIGH (ref 70–99)
Potassium: 4 mmol/L (ref 3.5–5.1)
Sodium: 131 mmol/L — ABNORMAL LOW (ref 135–145)

## 2020-07-21 LAB — CULTURE, BLOOD (ROUTINE X 2)
Culture: NO GROWTH
Culture: NO GROWTH
Special Requests: ADEQUATE
Special Requests: ADEQUATE

## 2020-07-21 LAB — GLUCOSE, CAPILLARY
Glucose-Capillary: 133 mg/dL — ABNORMAL HIGH (ref 70–99)
Glucose-Capillary: 137 mg/dL — ABNORMAL HIGH (ref 70–99)
Glucose-Capillary: 141 mg/dL — ABNORMAL HIGH (ref 70–99)
Glucose-Capillary: 143 mg/dL — ABNORMAL HIGH (ref 70–99)
Glucose-Capillary: 158 mg/dL — ABNORMAL HIGH (ref 70–99)
Glucose-Capillary: 159 mg/dL — ABNORMAL HIGH (ref 70–99)

## 2020-07-21 LAB — VITAMIN D 25 HYDROXY (VIT D DEFICIENCY, FRACTURES): Vit D, 25-Hydroxy: 42.26 ng/mL (ref 30–100)

## 2020-07-21 LAB — PHOSPHORUS: Phosphorus: 4.7 mg/dL — ABNORMAL HIGH (ref 2.5–4.6)

## 2020-07-21 LAB — MAGNESIUM: Magnesium: 2 mg/dL (ref 1.7–2.4)

## 2020-07-21 MED ORDER — DEXTROSE 5 % IV SOLN
10.0000 mg/kg | Freq: Three times a day (TID) | INTRAVENOUS | Status: AC
  Administered 2020-07-21 – 2020-07-26 (×15): 760 mg via INTRAVENOUS
  Filled 2020-07-21 (×17): qty 15.2

## 2020-07-21 MED ORDER — OSMOLITE 1.2 CAL PO LIQD: 1000.0000 mL | ORAL | Status: DC

## 2020-07-21 MED ORDER — VITAL HIGH PROTEIN PO LIQD: 1000.0000 mL | ORAL | Status: DC

## 2020-07-21 MED ORDER — CHLORHEXIDINE GLUCONATE CLOTH 2 % EX PADS
6.0000 | MEDICATED_PAD | Freq: Every day | CUTANEOUS | Status: DC
  Administered 2020-07-22 – 2020-08-05 (×15): 6 via TOPICAL

## 2020-07-21 MED ORDER — ADULT MULTIVITAMIN W/MINERALS CH
1.0000 | ORAL_TABLET | Freq: Every day | ORAL | Status: DC
  Administered 2020-07-23: 1
  Filled 2020-07-21 (×2): qty 1

## 2020-07-21 MED ORDER — THIAMINE HCL 100 MG/ML IJ SOLN
100.0000 mg | Freq: Every day | INTRAMUSCULAR | Status: DC
  Administered 2020-07-22 – 2020-07-27 (×5): 100 mg via INTRAVENOUS
  Filled 2020-07-21 (×6): qty 2

## 2020-07-21 MED ORDER — VITAL AF 1.2 CAL PO LIQD
1000.0000 mL | ORAL | Status: DC
  Administered 2020-07-22: 1000 mL

## 2020-07-21 NOTE — Progress Notes (Signed)
Daughter, Darl Pikes, called to update about Tube placement needing to be advanced and needing to wait until morning for xray. Says she understands the slow advancement, will await for updates tomorrow.

## 2020-07-21 NOTE — Progress Notes (Signed)
Nutrition Follow-up  DOCUMENTATION CODES:   Not applicable  INTERVENTION:  D/c Ensure Enlive D/c Cortrak order  Once feeding tube is placed, initiate  -Vital 1.2 @ 20 ml/hr, advance 10 ml every 8 hrs to goal 60 ml/hr  This regimen will provide 1728 kcal, 108 grams of protein, and 1166 ml free water  Pt at high risk for refeeding, recommend monitoring K, Mg, P daily as tube feedings advance towards goal.  NUTRITION DIAGNOSIS:   Inadequate oral intake related to acute illness as evidenced by other (comment) (per chart review). -ongoing  GOAL:   Patient will meet greater than or equal to 90% of their needs -unmet; starting tube feedings  MONITOR:   PO intake, Supplement acceptance, Labs, Weight trends, Skin, I & O's  REASON FOR ASSESSMENT:   Consult Enteral/tube feeding initiation and management  ASSESSMENT:  RD working remotely.  79 y/o female with h/o CHF, HLD, HTN, OSA, DM, Roux-en-y 2010, Lap band 2008, cholecystitis s/p (diagnostic laparoscopy, robotic lysis of adhesions, transgastric ERCP and biliary sphincterotomy, cholecystectomy, partial gastrectomy and colotomy 5/27) and colon resection for diverticulitis who is admitted with UTI, SIRS, shingles and encephalitis  Patient with ongoing lethargy and confusion, suspected metabolic source per neurology. Repeat CT abdomen/pelvis with persistant intra-abdominal fluid collection, IR drain pending. She has continued to have very poor oral intake throughout admission and refusing medications. Per chart, weights have trended down 8 lbs since admit. Per notes, consent received from daughter via phone for feeding tube placement. Spoke with MD via secure chat, pt to have Dobhoff placed in IR today. Patient is at high risk for refeeding given very poor po intake over the last 7 days, will initiate trickle feeds and advance slowly towards goal.    Medications reviewed and include: SSI, Lactulose, Thiamine, Valtrex IVPB:  Doxycycline  IVF: D5 NaCl with KCl 20 mEq @ 50 ml/hr  Labs: CBGs 141,137,158,143,157,164,130 x 24 hrs, Na 131 (L), P 4.7 (H) K/Mg -WNL  Diet Order:   Diet Order            Diet NPO time specified Except for: Sips with Meds  Diet effective midnight                 EDUCATION NEEDS:   Not appropriate for education at this time  Skin:  Skin Assessment: Reviewed RN Assessment (ecchymosis)  Last BM:  9/10-type 7 s/p lacutlose enema  Height:   Ht Readings from Last 1 Encounters:  07/12/20 5\' 4"  (1.626 m)    Weight:   Wt Readings from Last 1 Encounters:  07/21/20 75.8 kg    Ideal Body Weight:  54.5 kg  BMI:  Body mass index is 28.67 kg/m.  Estimated Nutritional Needs:   Kcal:  1700-1900kcal/day  Protein:  85-95g/day  Fluid:  1.6L/day   09/20/20, RD, LDN Clinical Nutrition After Hours/Weekend Pager # in Amion

## 2020-07-21 NOTE — Progress Notes (Signed)
Daughter, Darl Pikes called to give consent for feeding tube to be placed.

## 2020-07-21 NOTE — Progress Notes (Addendum)
PROGRESS NOTE    Casey Baldwin  ZOX:[id number] DOB: 1941/01/06 DOA: 07/12/2020 PCP: Lesly Rubenstein, MD   Chief complaint.  Altered mental status. 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 Rash is intensely pruritic different stages of healing some areas of vesicular be red border. Some areas covered in black eschars. Skin is diffusely tender.Per family (asked by me) pt developed confusion. Was c/o side to back pain.  Sed rate was elevated. LP was considered but patient declined stating that she had a history of meningitis in the past and very scared of having LP done. Hospitalist was called for admission for shingles and possible encephalitis.  9/8.IV acyclovir was changed to Valtrex. Patient has not had a bowel movement since admission, has poor appetite. Giving lactulose, increase the Protonix to twice a day.  9/9.Patient refused CPAP last night, she became more confused today. She also has reversed sleeping cycle. Added Seroquel 25 mg evening. Schedule lactulose twice a day for constipation.  9/10.  Patient slept last night with Seroquel.  Still very confused and fatigued.  Seen by neurologist, suspect metabolic source.  Repeated CT scan of the abdomen/pelvis, intra-abdominal fluid collection still present.    Assessment & Plan:   Active Problems:   SIRS (systemic inflammatory response syndrome) (HCC)   Shingles rash   Delirium   Acute metabolic encephalopathy   Essential hypertension   Anemia   Acute lower UTI   Chronic diastolic CHF (congestive heart failure)  (HCC)   Sepsis (HCC)   Change in mental status  #1.  Acute metabolic encephalopathy. Etiology not clear. Appreciate neurology consult, most likely source is metabolic per neurology. Patient had known intra-abdominal fluid collection, repeat a CT scan still present, but fluid collections seemed to be thin-walled, no gas inside.  Procalcitonin level less than 0.1, no leukocytosis.  Appreciate infective disease consult, discussed with Dr. Archer Asa from IR, no indication for fluid aspiration as the fluid has been stable and no evidence of infection.  Patient is currently treated with Valtrex for aseptic meningitis and shingles.  Doxycycline was also added yesterday by infect disease. For noninfectious cause, thiamine level was ordered by neurology.  Will start IV thiamine in case patient has deficiency.  Patient had a prior gastric bypass surgery making it a possibility. To improve patient sleep, trazodone was ordered per recommendation from neurology.  #2.  Anorexia and poor p.o. intake. Patient has very poor appetite.  Discussed with patient and her daughter, granddaughter.  They will discuss among themselves, decide if they accept a Dobbhoff feeding tube.  I will obtain dietitian consult, start tube feeding once Dobbhoff is placed. I will continue some IV fluids to keep her rehydrated. Continue Protonix for now.  Patient also on stool softeners.  3.  Sepsis secondary to UTI and a viral infection. Condition had improved.  4.  Shingles with a septic meningitis. Still on Valtrex.  5.  Essential hypertension. Continue current beta-blocker.  6.  Obstructive sleep apnea. Patient with partially compliant with CPAP.  7.  Urinary tract infection. Antibiotic completed.  8.  Chronic diastolic congestive heart failure. Stable.  9.  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  No evidence of dehydration or volume overload.  Has been stable for the last few days.  10.  Urinary retention. Bladder  scan showed over liter of residual, Foley catheter anchored.  Addendum: 1610: 1708. Feeding tube placed, x ray showed in the stomach. D/W with radiologist, Dr. Nadene RubinsWile, patient had gastric bypass surgery before, fube may be already close to duodeneum based on image. Recommended adance tube for 3 cm and let it flow overnight and recheck x ray in am. Will call radiology in am if tube does not flow through into the duodenum.   DVT prophylaxis: SCDs Code Status: Full Family Communication: Family discussion with granddaughter in the room, daughter on the phone.  .   Status is: Inpatient  Remains inpatient appropriate because:Inpatient level of care appropriate due to severity of illness   Dispo: The patient is from: Home              Anticipated d/c is to: SNF              Anticipated d/c date is: > 3 days              Patient currently is not medically stable to d/c.        I/O last 3 completed shifts: In: 1885.8 [I.V.:1635.8; IV Piggyback:250] Out: 750 [Urine:750] No intake/output data recorded.     Consultants:   Infect disease, neurology.  Procedures: Pending drain of intra-abdominal fluid collection.  Antimicrobials:  Doxycycline, Valtrex  Subjective: Spoke with the nurse, patient is very confused, she is not eating, refusing taking her medicines.  She has no short of breath.  She has no cough.  No fever chills.  No nausea vomiting.   Objective: Vitals:   07/20/20 2011 07/21/20 0045 07/21/20 0500 07/21/20 0741  BP: (!) 160/85 (!) 145/68  (!) 174/85  Pulse: 80 75  80  Resp: 18 16  14   Temp: 98.4 F (36.9 C) 98.6 F (37 C)  98.6 F (37 C)  TempSrc: Oral Oral  Oral  SpO2: 100% 92%  100%  Weight:   75.8 kg   Height:        Intake/Output Summary (Last 24 hours) at 07/21/2020 1001 Last data filed at 07/21/2020 0600 Gross per 24 hour  Intake 1338.92 ml  Output 650 ml  Net 688.92 ml   Filed Weights   07/12/20 0918 07/12/20 0921 07/21/20 0500  Weight: 79.4 kg 79.4 kg  75.8 kg    Examination:  General exam: Appears calm and comfortable, ill-appearing. Respiratory system: Clear to auscultation. Respiratory effort normal. Cardiovascular system: S1 & S2 heard, RRR. No JVD, murmurs, rubs, gallops or clicks. No pedal edema. Gastrointestinal system: Abdomen is nondistended, soft and nontender. No organomegaly or masses felt. Normal bowel sounds heard. Central nervous system: Alert and confused. No focal neurological deficits. Extremities: Symmetric . Skin: No rashes, lesions or ulcers     Data Reviewed: I have personally reviewed following labs and imaging studies  CBC: Recent Labs  Lab 07/16/20 1736 07/17/20 0928 07/19/20 0533 07/20/20 0448  WBC 6.4 8.0 6.9 6.2  NEUTROABS  --   --  4.9 4.1  HGB 11.7* 11.5* 11.2* 10.8*  HCT 33.8* 34.1* 32.7* 32.3*  MCV 94.2 96.9 93.7 96.4  PLT 297 305 280 271   Basic Metabolic Panel: Recent Labs  Lab 07/16/20 1915 07/17/20 0928 07/19/20 0533 07/20/20 0448 07/21/20 0434  NA 132* 132* 131* 130* 131*  K 3.3* 3.8 3.4* 3.7 4.0  CL 95*  96* 96* 96* 99  CO2 25 24 23 25 23   GLUCOSE 120* 133* 130* 134* 160*  BUN 15 14 11 9 12   CREATININE 0.77 0.81 0.76 0.81 0.96  CALCIUM 8.6* 8.9 8.8* 8.8* 9.3  MG  --   --  2.1 1.9 2.0  PHOS  --   --  3.3  --  4.7*   GFR: Estimated Creatinine Clearance: 46.6 mL/min (by C-G formula based on SCr of 0.96 mg/dL). Liver Function Tests: Recent Labs  Lab 07/16/20 1915  AST 18  ALT 13  ALKPHOS 64  BILITOT 0.7  PROT 6.8  ALBUMIN 3.4*   No results for input(s): LIPASE, AMYLASE in the last 168 hours. No results for input(s): AMMONIA in the last 168 hours. Coagulation Profile: No results for input(s): INR, PROTIME in the last 168 hours. Cardiac Enzymes: No results for input(s): CKTOTAL, CKMB, CKMBINDEX, TROPONINI in the last 168 hours. BNP (last 3 results) No results for input(s): PROBNP in the last 8760 hours. HbA1C: No results for input(s): HGBA1C in the last 72  hours. CBG: Recent Labs  Lab 07/20/20 1615 07/20/20 2011 07/21/20 0044 07/21/20 0453 07/21/20 0743  GLUCAP 164* 157* 143* 158* 137*   Lipid Profile: No results for input(s): CHOL, HDL, LDLCALC, TRIG, CHOLHDL, LDLDIRECT in the last 72 hours. Thyroid Function Tests: No results for input(s): TSH, T4TOTAL, FREET4, T3FREE, THYROIDAB in the last 72 hours. Anemia Panel: No results for input(s): VITAMINB12, FOLATE, FERRITIN, TIBC, IRON, RETICCTPCT in the last 72 hours. Sepsis Labs: Recent Labs  Lab 07/20/20 0448  PROCALCITON <0.10    Recent Results (from the past 240 hour(s))  Urine culture     Status: Abnormal   Collection Time: 07/12/20  9:23 PM   Specimen: Urine, Random  Result Value Ref Range Status   Specimen Description   Final    URINE, RANDOM Performed at [facility], [address], [place], [address] [place]    Special Requests   Final    NONE Performed at [facility], [address] [place], [address] [place]    Culture MULTIPLE SPECIES PRESENT, SUGGEST RECOLLECTION (A)  Final   Report Status 07/15/2020 FINAL  Final  Culture, blood (routine x 2)     Status: None   Collection Time: 07/12/20  9:23 PM   Specimen: BLOOD  Result Value Ref Range Status   Specimen Description BLOOD RIGHT AC  Final   Special Requests   Final    BOTTLES DRAWN AEROBIC AND ANAEROBIC Blood Culture results may not be optimal due to an excessive volume of blood received in culture bottles   Culture   Final    NO GROWTH 5 DAYS Performed at [facility], [address] [place], [address] [place]    Report Status 07/17/2020 FINAL  Final  Culture, blood (routine x 2)     Status: None   Collection Time: 07/12/20  9:23 PM   Specimen: BLOOD  Result Value Ref Range Status   Specimen Description BLOOD RIGHT WRIST  Final   Special Requests   Final    BOTTLES DRAWN AEROBIC AND ANAEROBIC Blood Culture adequate volume   Culture   Final    NO GROWTH 5  DAYS Performed at [facility], [address] [place], [address] [place]    Report Status 07/17/2020 FINAL  Final  SARS Coronavirus 2 by RT PCR (hospital order, performed in [facility] hospital lab) Nasopharyngeal Nasopharyngeal Swab     Status: None  Collection Time: 07/12/20 11:13 PM   Specimen: Nasopharyngeal Swab  Result Value Ref Range Status   SARS Coronavirus 2 NEGATIVE NEGATIVE Final    Comment: (NOTE) SARS-CoV-2 target nucleic acids are NOT DETECTED.  The SARS-CoV-2 RNA is generally detectable in upper and lower respiratory specimens during the acute phase of infection. The lowest concentration of SARS-CoV-2 viral copies this assay can detect is 250 copies / mL. A negative result does not preclude SARS-CoV-2 infection and should not be used as the sole basis for treatment or other patient management decisions.  A negative result may occur with improper specimen collection / handling, submission of specimen other than nasopharyngeal swab, presence of viral mutation(s) within the areas targeted by this assay, and inadequate number of viral copies (<250 copies / mL). A negative result must be combined with clinical observations, patient history, and epidemiological information.  Fact Sheet for Patients:   BoilerBrush.com.cy  Fact Sheet for Healthcare Providers: https://pope.com/  This test is not yet approved or  cleared by the [place] FDA and has been authorized for detection and/or diagnosis of SARS-CoV-2 by FDA under an Emergency Use Authorization (EUA).  This EUA will remain in effect (meaning this test can be used) for the duration of the COVID-19 declaration under Section 564(b)(1) of the Act, 21 U.S.C. section 360bbb-3(b)(1), unless the authorization is terminated or revoked sooner.  Performed at [facility], [address] [place], [place] [address]   MRSA PCR Screening     Status:  None   Collection Time: 07/13/20  2:02 AM   Specimen: Nasal Mucosa; Nasopharyngeal  Result Value Ref Range Status   MRSA by PCR NEGATIVE NEGATIVE Final    Comment:        The GeneXpert MRSA Assay (FDA approved for NASAL specimens only), is one component of a comprehensive MRSA colonization surveillance program. It is not intended to diagnose MRSA infection nor to guide or monitor treatment for MRSA infections. Performed at [facility], [address] [place], [place] [address]   CULTURE, BLOOD (ROUTINE X 2) w Reflex to ID Panel     Status: None   Collection Time: 07/16/20  5:36 PM   Specimen: BLOOD  Result Value Ref Range Status   Specimen Description BLOOD [facility]  Final   Special Requests   Final    BOTTLES DRAWN AEROBIC ONLY Blood Culture adequate volume   Culture   Final    NO GROWTH 5 DAYS Performed at [facility], [address] [place], [place] [address]    Report Status 07/21/2020 FINAL  Final  CULTURE, BLOOD (ROUTINE X 2) w Reflex to ID Panel     Status: None   Collection Time: 07/16/20  5:36 PM   Specimen: BLOOD  Result Value Ref Range Status   Specimen Description BLOOD [facility]  Final   Special Requests   Final    BOTTLES DRAWN AEROBIC AND ANAEROBIC Blood Culture adequate volume   Culture   Final    NO GROWTH 5 DAYS Performed at [facility], [address] [place], [place] [address]    Report Status 07/21/2020 FINAL  Final         Radiology Studies: EEG  Result Date: 07/20/2020 Charlsie Quest, MD     07/20/2020  5:50 PM .Patient Name: Gerica Koble MRN: [id number] Epilepsy Attending: Charlsie Quest Referring Physician/Provider: Dr Caryl Pina Date: 07/20/2020 Duration: 23.57 mins Patient history: 79 year old female with AMS in the context of  multiple medical comorbidities. EEG to evaluate for seizure Level of alertness: Awake AEDs during EEG study: None Technical aspects: This EEG study was done with scalp electrodes  positioned according to the 10-20 International system of electrode placement. Electrical activity was acquired at a sampling rate of 500Hz  and reviewed with a high frequency filter of 70Hz  and a low frequency filter of 1Hz . EEG data were recorded continuously and digitally stored. Description: No posterior dominant rhythm was seen. EEG showed continuous generalized 3 to 6 Hz theta-delta slowing.  Hyperventilation and photic stimulation were not performed.   ABNORMALITY -Continuous slow, generalized IMPRESSION: This study is suggestive of moderate diffuse encephalopathy, nonspecific etiology. No seizures or epileptiform discharges were seen throughout the recording.   CT ABDOMEN PELVIS W CONTRAST  Result Date: 07/20/2020 CLINICAL DATA:  Abdominal fluid collection on previous exam, history of fever EXAM: CT ABDOMEN AND PELVIS WITH CONTRAST TECHNIQUE: Multidetector CT imaging of the abdomen and pelvis was performed using the standard protocol following bolus administration of intravenous contrast. CONTRAST:  OMNIPAQUE IOHEXOL 300 MG/ML  SOLN COMPARISON:  07/14/2020 FINDINGS: Lower chest: Hypoventilatory changes are seen at the lung bases. No acute pleural or parenchymal lung disease. Hepatobiliary: No focal liver abnormality is seen. Status post cholecystectomy. No biliary dilatation. Pancreas: The pancreas enhances normally, without focal abnormality. There is mild chronic pancreatic duct dilation. Spleen: Normal in size without focal abnormality. Adrenals/Urinary Tract: Adrenal glands are unremarkable. Kidneys are normal, without renal calculi, focal lesion, or hydronephrosis. Bladder is unremarkable. Stomach/Bowel: Stable postsurgical changes from previous bariatric surgery. No evidence of bowel obstruction or ileus. No bowel wall thickening or inflammatory change. Vascular/Lymphatic: Stable atherosclerosis throughout the aorta. No pathologic adenopathy. Reproductive: Status post  hysterectomy. No adnexal masses. Other: The fluid collection arising from the inferolateral margin of the pancreatic head is again identified and unchanged, measuring 3.9 x 10.2 cm. Inflammatory changes are seen within the mesentery along the right paracolic gutter, stable. This fluid collection does not demonstrate any significant rim enhancement and there is no internal gas to suggest abscess. I would favor postoperative fluid collection after cholecystectomy between the 04/01/2020 and 07/14/2020 exams. This could reflect postoperative seroma or biloma. No other free fluid or free gas. Stable right lower quadrant ventral hernias containing multiple loops of small bowel. No evidence of obstruction or strangulation. Musculoskeletal: No acute or destructive bony lesions. Reconstructed images demonstrate no additional findings. IMPRESSION: 1. Stable fluid collection in the right mid abdomen, favor postoperative seroma or biloma after cholecystectomy. No rim enhancement or internal gas to suggest abscess. 2. No other acute intra-abdominal or intrapelvic process. 3. Stable right lower quadrant ventral hernias. 4.  Aortic Atherosclerosis (ICD10-I70.0). Electronically Signed   By: 09/13/2020 M.D.   On: 07/20/2020 19:14        Scheduled Meds: . docusate sodium  100 mg Oral BID  . feeding supplement (ENSURE ENLIVE)  237 mL Oral BID BM  . Ferrous Fumarate  1 tablet Oral BID  . insulin aspart  0-9 Units Subcutaneous Q4H  . lactulose  20 g Oral BID  . metoprolol tartrate  12.5 mg Oral BID  . multivitamin with minerals  1 tablet Oral Daily  . pantoprazole  20 mg Oral BID  . rosuvastatin  20 mg Oral Daily  . sodium chloride flush  3 mL Intravenous Q12H  . traZODone  25 mg Oral QHS  . valACYclovir  1,000 mg Oral TID   Continuous Infusions: . dextrose 5 % and  0.9 % NaCl with KCl 20 mEq/L 50 mL/hr at 07/21/20 0600  . doxycycline (VIBRAMYCIN) IV 100 mg (07/21/20 0851)     LOS: 8 days    Time spent:  45 minutes    Marrion Coy, MD [facility]   To contact the attending provider between 7A-7P or the covering provider during after hours 7P-7A, please log into the web site [email or web address] and access using universal [place] password for that web site. If you do not have the password, please call the hospital operator.  07/21/2020, 10:01 AM

## 2020-07-22 ENCOUNTER — Inpatient Hospital Stay: Payer: Medicare Other

## 2020-07-22 LAB — CBC WITH DIFFERENTIAL/PLATELET
Abs Immature Granulocytes: 0.03 10*3/uL (ref 0.00–0.07)
Basophils Absolute: 0 10*3/uL (ref 0.0–0.1)
Basophils Relative: 0 %
Eosinophils Absolute: 0.1 10*3/uL (ref 0.0–0.5)
Eosinophils Relative: 1 %
HCT: 29.6 % — ABNORMAL LOW (ref 36.0–46.0)
Hemoglobin: 10.1 g/dL — ABNORMAL LOW (ref 12.0–15.0)
Immature Granulocytes: 0 %
Lymphocytes Relative: 14 %
Lymphs Abs: 1 10*3/uL (ref 0.7–4.0)
MCH: 32.9 pg (ref 26.0–34.0)
MCHC: 34.1 g/dL (ref 30.0–36.0)
MCV: 96.4 fL (ref 80.0–100.0)
Monocytes Absolute: 0.7 10*3/uL (ref 0.1–1.0)
Monocytes Relative: 10 %
Neutro Abs: 5.3 10*3/uL (ref 1.7–7.7)
Neutrophils Relative %: 75 %
Platelets: 290 10*3/uL (ref 150–400)
RBC: 3.07 MIL/uL — ABNORMAL LOW (ref 3.87–5.11)
RDW: 13.8 % (ref 11.5–15.5)
WBC: 7.1 10*3/uL (ref 4.0–10.5)
nRBC: 0 % (ref 0.0–0.2)

## 2020-07-22 LAB — URINALYSIS, ROUTINE W REFLEX MICROSCOPIC
Bilirubin Urine: NEGATIVE
Glucose, UA: NEGATIVE mg/dL
Hgb urine dipstick: NEGATIVE
Ketones, ur: NEGATIVE mg/dL
Leukocytes,Ua: NEGATIVE
Nitrite: NEGATIVE
Protein, ur: NEGATIVE mg/dL
Specific Gravity, Urine: 1.018 (ref 1.005–1.030)
pH: 5 (ref 5.0–8.0)

## 2020-07-22 LAB — GLUCOSE, CAPILLARY
Glucose-Capillary: 120 mg/dL — ABNORMAL HIGH (ref 70–99)
Glucose-Capillary: 146 mg/dL — ABNORMAL HIGH (ref 70–99)
Glucose-Capillary: 140 mg/dL — ABNORMAL HIGH (ref 70–99)
Glucose-Capillary: 134 mg/dL — ABNORMAL HIGH (ref 70–99)
Glucose-Capillary: 151 mg/dL — ABNORMAL HIGH (ref 70–99)
Glucose-Capillary: 125 mg/dL — ABNORMAL HIGH (ref 70–99)

## 2020-07-22 LAB — BASIC METABOLIC PANEL
Anion gap: 7 (ref 5–15)
BUN: 12 mg/dL (ref 8–23)
CO2: 24 mmol/L (ref 22–32)
Calcium: 8.9 mg/dL (ref 8.9–10.3)
Chloride: 103 mmol/L (ref 98–111)
Creatinine, Ser: 0.79 mg/dL (ref 0.44–1.00)
GFR calc Af Amer: >60 mL/min (ref >60)
GFR calc non Af Amer: >60 mL/min (ref >60)
Glucose, Bld: 145 mg/dL — ABNORMAL HIGH (ref 70–99)
Potassium: 3.8 mmol/L (ref 3.5–5.1)
Sodium: 134 mmol/L — ABNORMAL LOW (ref 135–145)

## 2020-07-22 LAB — MAGNESIUM: Magnesium: 1.9 mg/dL (ref 1.7–2.4)

## 2020-07-22 MED ORDER — HALOPERIDOL LACTATE 5 MG/ML IJ SOLN
1.0000 mg | Freq: Four times a day (QID) | INTRAMUSCULAR | Status: DC | PRN
  Administered 2020-07-22 – 2020-08-24 (×2): 1 mg via INTRAVENOUS
  Filled 2020-07-22 (×2): qty 1

## 2020-07-22 MED ORDER — SODIUM CHLORIDE 0.9 % IV SOLN
INTRAVENOUS | Status: DC | PRN
  Administered 2020-07-22: 250 mL via INTRAVENOUS
  Administered 2020-08-19 – 2020-08-20 (×2): 500 mL via INTRAVENOUS

## 2020-07-22 NOTE — Progress Notes (Signed)
Per Dr. Chipper Herb , advance NG tube 3 cm , done by Jun , Rn and secured,. New order for DG of abdomen to verify placement

## 2020-07-22 NOTE — TOC Progression Note (Signed)
Transition of Care [facility]) - Progression Note    Patient Details  Name: Casey Baldwin MRN: [id number] Date of Birth: 1941-02-13  Transition of Care [facility]) CM/SW Contact  Maud Deed, [place] Phone Number: 07/22/2020, 10:37 AM  Clinical Narrative:    CSW reached out to pt's daughter Darl Pikes to provide bed offers and she said she would like to research these facilities.   TOC will continue to follow         Expected Discharge Plan and Services                                                 Social Determinants of Health (SDOH) Interventions    Readmission Risk Interventions No flowsheet data found.

## 2020-07-22 NOTE — Plan of Care (Signed)
  Problem: Education: Goal: Knowledge of General Education information will improve Description: Including pain rating scale, medication(s)/side effects and non-pharmacologic comfort measures Outcome: Progressing   Problem: Health Behavior/Discharge Planning: Goal: Ability to manage health-related needs will improve Outcome: Not Progressing   Problem: Clinical Measurements: Goal: Ability to maintain clinical measurements within normal limits will improve Outcome: Progressing Goal: Will remain free from infection Outcome: Progressing Goal: Diagnostic test results will improve Outcome: Progressing Goal: Respiratory complications will improve Outcome: Progressing Goal: Cardiovascular complication will be avoided Outcome: Progressing   Problem: Nutrition: Goal: Adequate nutrition will be maintained Outcome: Not Progressing   Problem: Coping: Goal: Level of anxiety will decrease Outcome: Progressing   Problem: Elimination: Goal: Will not experience complications related to bowel motility Outcome: Progressing Goal: Will not experience complications related to urinary retention Outcome: Progressing   Problem: Pain Managment: Goal: General experience of comfort will improve Outcome: Progressing

## 2020-07-22 NOTE — Progress Notes (Signed)
ID Chart reviewed and spoke to her nurse and Daughter and communicated with hospitalist Pt started Tube feeds this morning , but had pulled it out be 4 inches couple of hours ago. She is more awake but confused . Still not resonding to questions but talks on her own. From ID perspective continue IV acyclovir for zoster and possible aseptic meningitis. Also on Doxy X 48 hrs-for remote possibility of Tick borne illness will decide tomorrow Encephalopathy--Delirium seen by r neurology and multifactorial . Intraabdominal fluid collection likely pseudocyst- IR does not think it is infected and aspiration not recommended

## 2020-07-22 NOTE — Progress Notes (Signed)
This Clinical research associate called to patients room she had pulled her NG tube out about 4 inches,. Notified Dr. Chipper Herb

## 2020-07-22 NOTE — Progress Notes (Addendum)
PROGRESS NOTE    [name]  ZOX:[id number] DOB: 08-17-1941 DOA: 07/12/2020 PCP: Lesly RubensteinVerka, [name], MD   Chief complaint.  Altered mental status.  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 Rash is intensely pruritic different stages of healing some areas of vesicular be red border. Some areas covered in black eschars. Skin is diffusely tender.Per family (asked by me) pt developed confusion. Was c/o side to back pain.  Sed rate was elevated. LP was considered but patient declined stating that she had a history of meningitis in the past and very scared of having LP done. Hospitalist was called for admission for shingles and possible encephalitis.  9/8.IV acyclovir was changed to Valtrex. Patient has not had a bowel movement since admission, has poor appetite. Giving lactulose, increase the Protonix to twice a day.  9/9.Patient refused CPAP last night, she became more confused today. She also has reversed sleeping cycle. Added Seroquel 25 mg evening. Schedule lactulose twice a day for constipation.  9/10.Patient slept last night with Seroquel. Still very confused and fatigued.  Seen by neurologist, suspect metabolic source.  Repeated CT scan of the abdomen/pelvis, intra-abdominal fluid collection still present.    Assessment & Plan:   Active Problems:   SIRS (systemic inflammatory response syndrome) (HCC)   Shingles rash   Delirium   Acute metabolic encephalopathy   Essential hypertension   Anemia   Acute lower UTI   Chronic diastolic CHF (congestive heart failure)  (HCC)   Sepsis (HCC)   Change in mental status   Anorexia  #1.  Acute metabolic cephalopathy. Etiology still unclear. Patient is followed by infectious disease.  Currently still treated for aseptic meningitis and shingles.  Added doxycycline for possible tick associate disease, although unlikely. Urinary tract infection has resolved. Abdominal fluid collection was deemed to be noninfectious.  Consider noninfectious causes. Patient has some atrophy on MRI of the brain, currently has aseptic meningitis.  She could have dementia with delirium. Possibility of a thiamine deficiency due to gastric bypass surgery, thiamine started. Other consideration including reverse sleeping cycle, hospital environment.  Patient has been giving sleeping medicines, she is more awake today. Another consideration is depression, which can mimic dementia symptoms.  Will obtain psychiatry consult on Monday.  2.  Anorexia and poor p.o. intake. Patient currently receiving fluids to prevent dehydration.  Appetite is very poor.  NG tube is being, pending advance to Dobbhoff tube.  I spoke with Dr. Archer AsaMcCullough from radiology today, they would not be able to help today with advance of tube.  Will call radiology again tomorrow.  I will placed on her diet again.  Will start tube feeding once Dobbhoff in place tomorrow. Due to delay of Dobbhoff placement, discussed with the family, will use the NG tube to start lower dose tube feeding.  Head of bed elevated.  3.  Sepsis secondary UTI and viral infection. Condition had improved.  4.  Essential hypertension. Continue beta-blocker.  5.  Obstructive sleep apnea. Not able to use CPAP with NG tube and oxygen.  6.  Urinary retention. Patient had a large urinary retention, will use Foley catheter for a few days.  7.  Urinary tract infection. Antibiotic completed.  8.  Chronic diastolic congestive heart failure. Stable  9.  Hyponatremia secondary to SIADH. Continue to  follow.    DVT prophylaxis: SCDs Code Status: Full Family Communication: Spoke with daughter.  .   Status is: Inpatient  Remains inpatient appropriate because:Inpatient level of care appropriate due to severity of illness   Dispo: The patient is from: Home              Anticipated d/c is to: SNF              Anticipated d/c date is: > 3 days              Patient currently is not medically stable to d/c.        I/O last 3 completed shifts: In: 2887.8 [I.V.:1802; IV Piggyback:1085.8] Out: 2550 [Urine:2550] No intake/output data recorded.     Consultants:   ID and neurology  Procedures: NG  Antimicrobials:  Doxycycline and acyclovir IV.  Subjective: Patient is more awake today, but still very confused.  Very poor appetite, not taking anything by mouth. Patient does not have any short of breath or cough.   No fever or chills. No abdominal pain or nausea vomiting.  Objective: Vitals:   07/21/20 1537 07/22/20 0049 07/22/20 0421 07/22/20 0759  BP: (!) 175/86 (!) 144/81  (!) 147/75  Pulse: 84 83  80  Resp: 19 17  15   Temp: 97.7 F (36.5 C) 99.9 F (37.7 C)  98.9 F (37.2 C)  TempSrc: Oral Oral  Oral  SpO2: 100% 100%  100%  Weight:   74.4 kg   Height:        Intake/Output Summary (Last 24 hours) at 07/22/2020 0953 Last data filed at 07/22/2020 0410 Gross per 24 hour  Intake 1548.86 ml  Output 1900 ml  Net -351.14 ml   Filed Weights   07/12/20 0921 07/21/20 0500 07/22/20 0421  Weight: 79.4 kg 75.8 kg 74.4 kg    Examination:  General exam: Appears calm and comfortable  Respiratory system: Clear to auscultation. Respiratory effort normal. Cardiovascular system: S1 & S2 heard, RRR. No JVD, murmurs, rubs, gallops or clicks. No pedal edema. Gastrointestinal system: Abdomen is nondistended, soft and nontender. No organomegaly or masses felt. Normal bowel sounds heard. Central nervous system: Alert and confused.  No focal neurological  deficits. Extremities: Symmetric  Skin: No rashes, lesions or ulcers     Data Reviewed: I have personally reviewed following labs and imaging studies  CBC: Recent Labs  Lab 07/16/20 1736 07/17/20 0928 07/19/20 0533 07/20/20 0448 07/22/20 0439  WBC 6.4 8.0 6.9 6.2 7.1  NEUTROABS  --   --  4.9 4.1 5.3  HGB 11.7* 11.5* 11.2* 10.8* 10.1*  HCT 33.8* 34.1* 32.7* 32.3* 29.6*  MCV 94.2 96.9 93.7 96.4 96.4  PLT 297 305 280 271 290   Basic Metabolic Panel: Recent Labs  Lab 07/17/20 0928 07/19/20 0533 07/20/20 0448 07/21/20 0434 07/22/20 0439  NA 132* 131* 130* 131* 134*  K 3.8 3.4* 3.7 4.0 3.8  CL 96* 96* 96* 99 103  CO2 24 23 25 23 24   GLUCOSE 133* 130* 134* 160* 145*  BUN 14 11 9 12 12   CREATININE 0.81 0.76 0.81 0.96 0.79  CALCIUM 8.9 8.8* 8.8* 9.3 8.9  MG  --  2.1 1.9 2.0 1.9  PHOS  --  3.3  --  4.7*  --    GFR: Estimated Creatinine Clearance: 55.4 mL/min (by  C-G formula based on SCr of 0.79 mg/dL). Liver Function Tests: Recent Labs  Lab 07/16/20 1915  AST 18  ALT 13  ALKPHOS 64  BILITOT 0.7  PROT 6.8  ALBUMIN 3.4*   No results for input(s): LIPASE, AMYLASE in the last 168 hours. No results for input(s): AMMONIA in the last 168 hours. Coagulation Profile: No results for input(s): INR, PROTIME in the last 168 hours. Cardiac Enzymes: No results for input(s): CKTOTAL, CKMB, CKMBINDEX, TROPONINI in the last 168 hours. BNP (last 3 results) No results for input(s): PROBNP in the last 8760 hours. HbA1C: No results for input(s): HGBA1C in the last 72 hours. CBG: Recent Labs  Lab 07/21/20 1616 07/21/20 2024 07/22/20 0046 07/22/20 0417 07/22/20 0800  GLUCAP 133* 159* 120* 146* 140*   Lipid Profile: No results for input(s): CHOL, HDL, LDLCALC, TRIG, CHOLHDL, LDLDIRECT in the last 72 hours. Thyroid Function Tests: No results for input(s): TSH, T4TOTAL, FREET4, T3FREE, THYROIDAB in the last 72 hours. Anemia Panel: No results for input(s): VITAMINB12,  FOLATE, FERRITIN, TIBC, IRON, RETICCTPCT in the last 72 hours. Sepsis Labs: Recent Labs  Lab 07/20/20 0448  PROCALCITON <0.10    Recent Results (from the past 240 hour(s))  Urine culture     Status: Abnormal   Collection Time: 07/12/20  9:23 PM   Specimen: Urine, Random  Result Value Ref Range Status   Specimen Description   Final    URINE, RANDOM Performed at [facility], [address], [place], [place] [address]    Special Requests   Final    NONE Performed at [facility], [address] [place], [place] [address]    Culture MULTIPLE SPECIES PRESENT, SUGGEST RECOLLECTION (A)  Final   Report Status 07/15/2020 FINAL  Final  Culture, blood (routine x 2)     Status: None   Collection Time: 07/12/20  9:23 PM   Specimen: BLOOD  Result Value Ref Range Status   Specimen Description BLOOD RIGHT AC  Final   Special Requests   Final    BOTTLES DRAWN AEROBIC AND ANAEROBIC Blood Culture results may not be optimal due to an excessive volume of blood received in culture bottles   Culture   Final    NO GROWTH 5 DAYS Performed at [facility], [address], [place], [place] [address]    Report Status 07/17/2020 FINAL  Final  Culture, blood (routine x 2)     Status: None   Collection Time: 07/12/20  9:23 PM   Specimen: BLOOD  Result Value Ref Range Status   Specimen Description BLOOD RIGHT WRIST  Final   Special Requests   Final    BOTTLES DRAWN AEROBIC AND ANAEROBIC Blood Culture adequate volume   Culture   Final    NO GROWTH 5 DAYS Performed at [facility], [address] [place], [place] [address]    Report Status 07/17/2020 FINAL  Final  SARS Coronavirus 2 by RT PCR (hospital order, performed in [facility] hospital lab) Nasopharyngeal Nasopharyngeal Swab     Status: None   Collection Time: 07/12/20 11:13 PM   Specimen: Nasopharyngeal Swab  Result Value Ref Range Status   SARS Coronavirus 2 NEGATIVE NEGATIVE Final    Comment:  (NOTE) SARS-CoV-2 target nucleic acids are NOT DETECTED.  The SARS-CoV-2 RNA is generally detectable in upper and lower respiratory specimens during the acute phase of infection. The lowest concentration of SARS-CoV-2 viral copies this assay can detect is 250 copies / mL.  A negative result does not preclude SARS-CoV-2 infection and should not be used as the sole basis for treatment or other patient management decisions.  A negative result may occur with improper specimen collection / handling, submission of specimen other than nasopharyngeal swab, presence of viral mutation(s) within the areas targeted by this assay, and inadequate number of viral copies (<250 copies / mL). A negative result must be combined with clinical observations, patient history, and epidemiological information.  Fact Sheet for Patients:   BoilerBrush.com.cy  Fact Sheet for Healthcare Providers: https://pope.com/  This test is not yet approved or  cleared by the [place] FDA and has been authorized for detection and/or diagnosis of SARS-CoV-2 by FDA under an Emergency Use Authorization (EUA).  This EUA will remain in effect (meaning this test can be used) for the duration of the COVID-19 declaration under Section 564(b)(1) of the Act, 21 U.S.C. section 360bbb-3(b)(1), unless the authorization is terminated or revoked sooner.  Performed at [facility], [address] [place], [place] [address]   MRSA PCR Screening     Status: None   Collection Time: 07/13/20  2:02 AM   Specimen: Nasal Mucosa; Nasopharyngeal  Result Value Ref Range Status   MRSA by PCR NEGATIVE NEGATIVE Final    Comment:        The GeneXpert MRSA Assay (FDA approved for NASAL specimens only), is one component of a comprehensive MRSA colonization surveillance program. It is not intended to diagnose MRSA infection nor to guide or monitor treatment for MRSA  infections. Performed at [facility], [address] [place], [place] [address]   CULTURE, BLOOD (ROUTINE X 2) w Reflex to ID Panel     Status: None   Collection Time: 07/16/20  5:36 PM   Specimen: BLOOD  Result Value Ref Range Status   Specimen Description BLOOD [facility]  Final   Special Requests   Final    BOTTLES DRAWN AEROBIC ONLY Blood Culture adequate volume   Culture   Final    NO GROWTH 5 DAYS Performed at [facility], [address] [place], [place] [address]    Report Status 07/21/2020 FINAL  Final  CULTURE, BLOOD (ROUTINE X 2) w Reflex to ID Panel     Status: None   Collection Time: 07/16/20  5:36 PM   Specimen: BLOOD  Result Value Ref Range Status   Specimen Description BLOOD [facility]  Final   Special Requests   Final    BOTTLES DRAWN AEROBIC AND ANAEROBIC Blood Culture adequate volume   Culture   Final    NO GROWTH 5 DAYS Performed at [facility], [address] [place] [place], [place] [address]    Report Status 07/21/2020 FINAL  Final         Radiology Studies: EEG  Result Date: 07/20/2020 Charlsie Quest, MD     07/20/2020  5:50 PM .Patient Name: Casey Baldwin MRN: [id number] Epilepsy Attending: Charlsie Quest Referring Physician/Provider: Dr Caryl Pina Date: 07/20/2020 Duration: 23.57 mins Patient history: 79 year old female with AMS in the context of multiple medical comorbidities. EEG to evaluate for seizure Level of alertness: Awake AEDs during EEG study: None Technical aspects: This EEG study was done with scalp electrodes positioned according to the 10-20 International system of electrode placement. Electrical activity was acquired at a sampling rate of 500Hz  and reviewed with a high frequency filter of 70Hz  and a low frequency filter of 1Hz . EEG data were recorded continuously and digitally stored.  Description: No posterior dominant rhythm was seen. EEG showed continuous generalized 3 to 6 Hz theta-delta slowing.  Hyperventilation and  photic stimulation were not performed.   ABNORMALITY -Continuous slow, generalized IMPRESSION: This study is suggestive of moderate diffuse encephalopathy, nonspecific etiology. No seizures or epileptiform discharges were seen throughout the recording. Charlsie Quest   DG Abd 1 View  Result Date: 07/22/2020 CLINICAL DATA:  Feeding tube positioning EXAM: X-RAY ABDOMEN 1 VIEW COMPARISON:  July 21, 2020. FINDINGS: Feeding tube tip is in the proximal stomach, unchanged from 1 day prior. There is moderate stool in the colon. Air is seen throughout the transverse and descending colonic regions. No focus suggesting bowel obstruction. No free air evident. There are surgical clips in the upper abdomen. Lung bases are clear. IMPRESSION: Feeding tube tip in proximal stomach region. Air throughout much of the colon. A mild degree of colonic ileus cannot be excluded. No findings suggesting bowel obstruction. Lung bases clear. Electronically Signed   By: Bretta Bang III M.D.   On: 07/22/2020 09:30   CT ABDOMEN PELVIS W CONTRAST  Result Date: 07/20/2020 CLINICAL DATA:  Abdominal fluid collection on previous exam, history of fever EXAM: CT ABDOMEN AND PELVIS WITH CONTRAST TECHNIQUE: Multidetector CT imaging of the abdomen and pelvis was performed using the standard protocol following bolus administration of intravenous contrast. CONTRAST:  OMNIPAQUE IOHEXOL 300 MG/ML  SOLN COMPARISON:  07/14/2020 FINDINGS: Lower chest: Hypoventilatory changes are seen at the lung bases. No acute pleural or parenchymal lung disease. Hepatobiliary: No focal liver abnormality is seen. Status post cholecystectomy. No biliary dilatation. Pancreas: The pancreas enhances normally, without focal abnormality. There is mild chronic pancreatic duct dilation. Spleen: Normal in size without focal abnormality. Adrenals/Urinary Tract: Adrenal glands are unremarkable. Kidneys are normal, without renal calculi, focal lesion, or  hydronephrosis. Bladder is unremarkable. Stomach/Bowel: Stable postsurgical changes from previous bariatric surgery. No evidence of bowel obstruction or ileus. No bowel wall thickening or inflammatory change. Vascular/Lymphatic: Stable atherosclerosis throughout the aorta. No pathologic adenopathy. Reproductive: Status post hysterectomy. No adnexal masses. Other: The fluid collection arising from the inferolateral margin of the pancreatic head is again identified and unchanged, measuring 3.9 x 10.2 cm. Inflammatory changes are seen within the mesentery along the right paracolic gutter, stable. This fluid collection does not demonstrate any significant rim enhancement and there is no internal gas to suggest abscess. I would favor postoperative fluid collection after cholecystectomy between the 04/01/2020 and 07/14/2020 exams. This could reflect postoperative seroma or biloma. No other free fluid or free gas. Stable right lower quadrant ventral hernias containing multiple loops of small bowel. No evidence of obstruction or strangulation. Musculoskeletal: No acute or destructive bony lesions. Reconstructed images demonstrate no additional findings. IMPRESSION: 1. Stable fluid collection in the right mid abdomen, favor postoperative seroma or biloma after cholecystectomy. No rim enhancement or internal gas to suggest abscess. 2. No other acute intra-abdominal or intrapelvic process. 3. Stable right lower quadrant ventral hernias. 4.  Aortic Atherosclerosis (ICD10-I70.0). Electronically Signed   By: Sharlet Salina M.D.   On: 07/20/2020 19:14   DG Abd Portable 1V  Result Date: 07/21/2020 CLINICAL DATA:  Feeding tube placement EXAM: X-RAY ABDOMEN 1 VIEW COMPARISON:  Portable exam 1502 hours compared to CT abdomen and pelvis 07/20/2020 FINDINGS: Tip of feeding tube projects over proximal stomach. Scattered gas and stool throughout visualized colon without dilatation. Lung bases clear. Atherosclerotic calcification  aorta. Bones demineralized. IMPRESSION: Tip of feeding tube projects over proximal stomach. Electronically Signed  By: Ulyses Southward M.D.   On: 07/21/2020 15:45        Scheduled Meds: . Chlorhexidine Gluconate Cloth  6 each Topical Daily  . docusate sodium  100 mg Oral BID  . Ferrous Fumarate  1 tablet Oral BID  . insulin aspart  0-9 Units Subcutaneous Q4H  . lactulose  20 g Oral BID  . metoprolol tartrate  12.5 mg Oral BID  . multivitamin with minerals  1 tablet Per Tube Daily  . pantoprazole  20 mg Oral BID  . rosuvastatin  20 mg Oral Daily  . sodium chloride flush  3 mL Intravenous Q12H  . thiamine injection  100 mg Intravenous Daily  . traZODone  25 mg Oral QHS   Continuous Infusions: . sodium chloride 10 mL/hr at 07/22/20 0762  . acyclovir 760 mg (07/22/20 0634)  . dextrose 5 % and 0.9 % NaCl with KCl 20 mEq/L 50 mL/hr at 07/22/20 0300  . doxycycline (VIBRAMYCIN) IV 100 mg (07/22/20 0947)  . feeding supplement (VITAL AF 1.2 CAL)       LOS: 9 days    Time spent: 36 minutes    Marrion Coy, MD [facility]   To contact the attending provider between 7A-7P or the covering provider during after hours 7P-7A, please log into the web site [email or web address] and access using universal [place] password for that web site. If you do not have the password, please call the hospital operator.  07/22/2020, 9:53 AM

## 2020-07-22 NOTE — Progress Notes (Signed)
Patient IV to Right wrist is infiltrated, removed and IV consult placed .

## 2020-07-22 NOTE — Progress Notes (Signed)
Pts  Feeding tube clogged, unable to give meds po.

## 2020-07-22 NOTE — Progress Notes (Signed)
PATIENT STARTED ON TF AT HOUR VIA DOUBHOFF NG TUBE, TOLERATING WELL AT THIS TIME.

## 2020-07-22 NOTE — Progress Notes (Signed)
Placement of Douhoff correct per Dr. Chipper Herb, started TF at 20cc/hour. Patient tolerating well at this time [facility] at 30

## 2020-07-23 ENCOUNTER — Inpatient Hospital Stay: Payer: Medicare Other

## 2020-07-23 DIAGNOSIS — R52 Pain, unspecified: Secondary | ICD-10-CM | POA: Diagnosis not present

## 2020-07-23 DIAGNOSIS — R519 Headache, unspecified: Secondary | ICD-10-CM | POA: Diagnosis not present

## 2020-07-23 DIAGNOSIS — R509 Fever, unspecified: Secondary | ICD-10-CM | POA: Diagnosis not present

## 2020-07-23 DIAGNOSIS — B029 Zoster without complications: Secondary | ICD-10-CM | POA: Diagnosis not present

## 2020-07-23 LAB — COMPREHENSIVE METABOLIC PANEL
ALT: 29 U/L (ref 0–44)
AST: 27 U/L (ref 15–41)
Albumin: 3.3 g/dL — ABNORMAL LOW (ref 3.5–5.0)
Alkaline Phosphatase: 71 U/L (ref 38–126)
Anion gap: 11 (ref 5–15)
BUN: 12 mg/dL (ref 8–23)
CO2: 24 mmol/L (ref 22–32)
Calcium: 9.2 mg/dL (ref 8.9–10.3)
Chloride: 92 mmol/L — ABNORMAL LOW (ref 98–111)
Creatinine, Ser: 0.62 mg/dL (ref 0.44–1.00)
GFR calc Af Amer: >60 mL/min (ref >60)
GFR calc non Af Amer: >60 mL/min (ref >60)
Glucose, Bld: 143 mg/dL — ABNORMAL HIGH (ref 70–99)
Potassium: 3.6 mmol/L (ref 3.5–5.1)
Sodium: 127 mmol/L — ABNORMAL LOW (ref 135–145)
Total Bilirubin: 0.8 mg/dL (ref 0.3–1.2)
Total Protein: 7 g/dL (ref 6.5–8.1)

## 2020-07-23 LAB — CBC WITH DIFFERENTIAL/PLATELET
Abs Immature Granulocytes: 0.09 10*3/uL — ABNORMAL HIGH (ref 0.00–0.07)
Basophils Absolute: 0 10*3/uL (ref 0.0–0.1)
Basophils Relative: 0 %
Eosinophils Absolute: 0.1 10*3/uL (ref 0.0–0.5)
Eosinophils Relative: 1 %
HCT: 31.2 % — ABNORMAL LOW (ref 36.0–46.0)
Hemoglobin: 11.1 g/dL — ABNORMAL LOW (ref 12.0–15.0)
Immature Granulocytes: 1 %
Lymphocytes Relative: 12 %
Lymphs Abs: 1.2 10*3/uL (ref 0.7–4.0)
MCH: 32.5 pg (ref 26.0–34.0)
MCHC: 35.6 g/dL (ref 30.0–36.0)
MCV: 91.2 fL (ref 80.0–100.0)
Monocytes Absolute: 0.9 10*3/uL (ref 0.1–1.0)
Monocytes Relative: 9 %
Neutro Abs: 7.4 10*3/uL (ref 1.7–7.7)
Neutrophils Relative %: 77 %
Platelets: 311 10*3/uL (ref 150–400)
RBC: 3.42 MIL/uL — ABNORMAL LOW (ref 3.87–5.11)
RDW: 13.2 % (ref 11.5–15.5)
WBC: 9.8 10*3/uL (ref 4.0–10.5)
nRBC: 0 % (ref 0.0–0.2)

## 2020-07-23 LAB — GLUCOSE, CAPILLARY
Glucose-Capillary: 130 mg/dL — ABNORMAL HIGH (ref 70–99)
Glucose-Capillary: 134 mg/dL — ABNORMAL HIGH (ref 70–99)
Glucose-Capillary: 138 mg/dL — ABNORMAL HIGH (ref 70–99)
Glucose-Capillary: 138 mg/dL — ABNORMAL HIGH (ref 70–99)
Glucose-Capillary: 140 mg/dL — ABNORMAL HIGH (ref 70–99)
Glucose-Capillary: 144 mg/dL — ABNORMAL HIGH (ref 70–99)
Glucose-Capillary: 198 mg/dL — ABNORMAL HIGH (ref 70–99)

## 2020-07-23 LAB — B. BURGDORFI ANTIBODIES: B burgdorferi Ab IgG+IgM: <0.91 {ISR} (ref 0.00–0.90)

## 2020-07-23 LAB — PROCALCITONIN: Procalcitonin: <0.1 ng/mL

## 2020-07-23 MED ORDER — IOPAMIDOL (ISOVUE-300) INJECTION 61%
10.0000 mL | Freq: Once | INTRAVENOUS | Status: AC | PRN
  Administered 2020-07-23: 10 mL

## 2020-07-23 MED ORDER — PANTOPRAZOLE SODIUM 40 MG IV SOLR
40.0000 mg | Freq: Two times a day (BID) | INTRAVENOUS | Status: DC
  Administered 2020-07-23 – 2020-07-27 (×9): 40 mg via INTRAVENOUS
  Filled 2020-07-23 (×8): qty 40

## 2020-07-23 MED ORDER — ENOXAPARIN SODIUM 40 MG/0.4ML ~~LOC~~ SOLN: 40.0000 mg | SUBCUTANEOUS | Status: DC

## 2020-07-23 MED ORDER — SODIUM CHLORIDE 0.9 % IV SOLN
2.0000 g | INTRAVENOUS | Status: DC
  Administered 2020-07-23 – 2020-07-25 (×10): 2 g via INTRAVENOUS
  Filled 2020-07-23: qty 2
  Filled 2020-07-23 (×2): qty 2000
  Filled 2020-07-23: qty 2
  Filled 2020-07-23 (×2): qty 2000
  Filled 2020-07-23: qty 2
  Filled 2020-07-23: qty 2000
  Filled 2020-07-23: qty 2
  Filled 2020-07-23: qty 2000
  Filled 2020-07-23: qty 2
  Filled 2020-07-23 (×2): qty 2000
  Filled 2020-07-23: qty 2
  Filled 2020-07-23: qty 2000
  Filled 2020-07-23: qty 2

## 2020-07-23 MED ORDER — SODIUM CHLORIDE 0.9 % IV SOLN
2.0000 g | Freq: Two times a day (BID) | INTRAVENOUS | Status: DC
  Administered 2020-07-23 – 2020-07-24 (×3): 2 g via INTRAVENOUS
  Filled 2020-07-23 (×2): qty 2
  Filled 2020-07-23 (×3): qty 20

## 2020-07-23 MED ORDER — ADULT MULTIVITAMIN LIQUID CH
15.0000 mL | Freq: Every day | ORAL | Status: DC
  Filled 2020-07-23: qty 15

## 2020-07-23 MED ORDER — TAMSULOSIN HCL 0.4 MG PO CAPS
0.4000 mg | ORAL_CAPSULE | Freq: Every day | ORAL | Status: DC
  Administered 2020-07-24 – 2020-08-21 (×23): 0.4 mg via ORAL
  Filled 2020-07-23 (×26): qty 1

## 2020-07-23 NOTE — Progress Notes (Signed)
Pts  Feeding tube clogged, unable to give meds po.  Per MD, discontinue feeding at this time.

## 2020-07-23 NOTE — Progress Notes (Signed)
Physical Therapy Treatment Patient Details Name: Casey Baldwin MRN: [id number] DOB: 1941-03-20 Today's Date: 07/23/2020    History of Present Illness Casey Baldwin is a 79 y.o. 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  Presented to ER secondary to generalized body aches, progressive weakness, headaches; admitted for management of SIRS (urine?), shingles and acute metabolic encephalopathy.    PT Comments    Attempted prior to lunch.  Asleep and only woke briefly before falling back asleep.  After lunch pt was awake.  Participated in exercises as described below.  She does assist some with exercises but fatigues quickly and keeps eyes closed through most of session.  Seems to fall back asleep after exercises.  Unsafe to progress mobility at this time.   Follow Up Recommendations  SNF     Equipment Recommendations       Recommendations for Other Services       Precautions / Restrictions Precautions Precautions: Fall Restrictions Other Position/Activity Restrictions: NG tube    Mobility  Bed Mobility                  Transfers                    Ambulation/Gait                 Stairs             Wheelchair Mobility    Modified Rankin (Stroke Patients Only)       Balance                                            Cognition Arousal/Alertness: Lethargic Behavior During Therapy: WFL for tasks assessed/performed Overall Cognitive Status: Impaired/Different from baseline                                        Exercises Other Exercises Other Exercises: BLE P/AAROM BLE - ankle pumps, SLR, ab/add and heel slides.x 10    General Comments        Pertinent Vitals/Pain Pain Assessment: No/denies pain    Home Living                      Prior Function            PT Goals (current goals can now be  found in the care plan section) Progress towards PT goals: Not progressing toward goals - comment    Frequency    Min 2X/week      PT Plan Current plan remains appropriate    Co-evaluation              AM-PAC PT "6 Clicks" Mobility   Outcome Measure  Help needed turning from your back to your side while in a flat bed without using bedrails?: Total Help needed moving from lying on your back to sitting on the side of a flat bed without using bedrails?: Total Help needed moving to and from a bed to a chair (including a wheelchair)?: Total Help needed standing up from a chair using your arms (e.g., wheelchair or bedside chair)?: Total Help needed to walk in hospital room?: Total Help needed climbing 3-5 steps with a  railing? : Total 6 Click Score: 6    End of Session   Activity Tolerance: Patient limited by fatigue Patient left: in bed;with bed alarm set;with call bell/phone within reach;with nursing/sitter in room         Time: 5035-4656 PT Time Calculation (min) (ACUTE ONLY): 8 min  Charges:  $Therapeutic Exercise: 8-22 mins                    Danielle Dess, PTA 07/23/20, 1:19 PM

## 2020-07-23 NOTE — Progress Notes (Signed)
PROGRESS NOTE    Casey GentileBrenda Baldwin  RUE:[id number] DOB: July 15, 1941 DOA: 07/12/2020 PCP: Lesly RubensteinVerka, [name], MD   Chief complaint.  Altered mental status. 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 Rash is intensely pruritic different stages of healing some areas of vesicular be red border. Some areas covered in black eschars. Skin is diffusely tender.Per family (asked by me) pt developed confusion. Was c/o side to back pain.  Sed rate was elevated. LP was considered but patient declined stating that she had a history of meningitis in the past and very scared of having LP done. Hospitalist was called for admission for shingles and possible encephalitis.  9/8.IV acyclovir was changed to Valtrex. Patient has not had a bowel movement since admission, has poor appetite. Giving lactulose, increase the Protonix to twice a day.  9/9.Patient refused CPAP last night, she became more confused today. She also has reversed sleeping cycle. Added Seroquel 25 mg evening. Schedule lactulose twice a day for constipation.  9/10.Patient slept last night with Seroquel. Still very confused and fatigued.Seen by neurologist, suspect metabolic source. Repeated CT scan of the abdomen/pelvis, intra-abdominal fluid collection still present.   9/13.  Dobbhoff placed, tube feeding started.  Also consult psychiatry for 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   Assessment & Plan:   Active Problems:   SIRS (systemic inflammatory response syndrome) (HCC)   Shingles rash   Delirium   Acute metabolic encephalopathy   Essential  hypertension   Anemia   Acute lower UTI   Chronic diastolic CHF (congestive heart failure) (HCC)   Sepsis (HCC)   Change in mental status   Anorexia  #1. Acute metabolic encephalopathy. Etiology is still unclear, infectious cause was considered less likely.  Patient probably has some dementia, she developed worsening delirium due to acute illness including aseptic meningitis. Continue trazodone to help with sleep. Continue thiamine IV. Continue Haldol as needed for agitation. Consult psychiatry for 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  2.  Anorexia with poor p.o. intake. Dobbhoff was finally placed today.  I will discontinue IV fluids.  Tube feeding already started yesterday.  Titrate up per recommendation from dietitian.  3.  Sepsis secondary UTI and viral infection. Condition resolved.  4.  Shingles with aseptic meningitis. Still on a Cyclovir.  5.  Obstructive sleep apnea. Follow, not able to wear CPAP due to NG tube.  6.  Urinary retention. Continue Foley catheter.  Start Flomax.  7.  Urinary tract infection. Antibiotics completed.  8.  Chronic diastolic congestive heart failure. No volume overload.  9.  Hyponatremia secondary to SIADH. Continue to follow.     DVT prophylaxis: Lovenox Code Status: Full Family Communication: Daughter updated.  .   Status is: Inpatient  Remains inpatient appropriate because:Inpatient level of care appropriate due to severity of illness   Dispo: The patient is from: Home              Anticipated d/c is to: SNF              Anticipated d/c date is: > 3 days              Patient currently is not medically stable to  d/c.        I/O last 3 completed shifts: In: 2770.1 [P.O.:30; I.V.:1273; NG/GT:80; IV Piggyback:1387.1] Out: 2700 [Urine:2700] No intake/output data recorded.     Consultants:   Psychiatry, ID, neurology  Procedures: none  Antimicrobials: Acyclovir  Subjective: Patient became very agitated yesterday, was given  Haldol as needed.  She slept well last night, still very confused today.  She is less agitated today. She does not have any short of breath, she has no cough. She has not been eating.  She was able to tolerate some tube feeding last night, tube feeding restarted after Dobbhoff was placed today. No fever chills.   Objective: Vitals:   07/22/20 0759 07/23/20 0022 07/23/20 0500 07/23/20 0800  BP: (!) 147/75 (!) 175/77  139/75  Pulse: 80 83  76  Resp: 15 16  18   Temp: 98.9 F (37.2 C) 99.1 F (37.3 C)  99.8 F (37.7 C)  TempSrc: Oral Oral  Oral  SpO2: 100% 100%  100%  Weight:   73 kg   Height:        Intake/Output Summary (Last 24 hours) at 07/23/2020 1223 Last data filed at 07/23/2020 0530 Gross per 24 hour  Intake 1221.27 ml  Output 1250 ml  Net -28.73 ml   Filed Weights   07/21/20 0500 07/22/20 0421 07/23/20 0500  Weight: 75.8 kg 74.4 kg 73 kg    Examination:  General exam: Appears calm and comfortable  Respiratory system: Clear to auscultation. Respiratory effort normal. Cardiovascular system: S1 & S2 heard, RRR. No JVD, murmurs, rubs, gallops or clicks. No pedal edema. Gastrointestinal system: Abdomen is nondistended, soft and nontender. No organomegaly or masses felt. Normal bowel sounds heard. Central nervous system: Alert and oriented x1. No focal neurological deficits. Extremities: Symmetric  Skin: No rashes, lesions or ulcers Psychiatry: Flat affect   Data Reviewed: I have personally reviewed following labs and imaging studies  CBC: Recent Labs  Lab 07/16/20 1736 07/17/20 0928 07/19/20 0533 07/20/20 0448 07/22/20 0439  WBC 6.4 8.0 6.9 6.2 7.1  NEUTROABS  --   --  4.9 4.1 5.3  HGB 11.7* 11.5* 11.2* 10.8* 10.1*  HCT 33.8* 34.1* 32.7* 32.3* 29.6*  MCV 94.2 96.9 93.7 96.4 96.4  PLT 297 305 280 271 290   Basic Metabolic Panel: Recent Labs  Lab 07/17/20 0928 07/19/20 0533 07/20/20 0448 07/21/20 0434 07/22/20 0439  NA 132* 131* 130* 131* 134*  K  3.8 3.4* 3.7 4.0 3.8  CL 96* 96* 96* 99 103  CO2 24 23 25 23 24   GLUCOSE 133* 130* 134* 160* 145*  BUN 14 11 9 12 12   CREATININE 0.81 0.76 0.81 0.96 0.79  CALCIUM 8.9 8.8* 8.8* 9.3 8.9  MG  --  2.1 1.9 2.0 1.9  PHOS  --  3.3  --  4.7*  --    GFR: Estimated Creatinine Clearance: 54.9 mL/min (by C-G formula based on SCr of 0.79 mg/dL). Liver Function Tests: Recent Labs  Lab 07/16/20 1915  AST 18  ALT 13  ALKPHOS 64  BILITOT 0.7  PROT 6.8  ALBUMIN 3.4*   No results for input(s): LIPASE, AMYLASE in the last 168 hours. No results for input(s): AMMONIA in the last 168 hours. Coagulation Profile: No results for input(s): INR, PROTIME in the last 168 hours. Cardiac Enzymes: No results for input(s): CKTOTAL, CKMB, CKMBINDEX, TROPONINI in the last 168 hours. BNP (last 3 results) No results for input(s): PROBNP in the last 8760 hours. HbA1C: No results  for input(s): HGBA1C in the last 72 hours. CBG: Recent Labs  Lab 07/22/20 2011 07/23/20 0024 07/23/20 0449 07/23/20 0802 07/23/20 1150  GLUCAP 125* 130* 138* 144* 134*   Lipid Profile: No results for input(s): CHOL, HDL, LDLCALC, TRIG, CHOLHDL, LDLDIRECT in the last 72 hours. Thyroid Function Tests: No results for input(s): TSH, T4TOTAL, FREET4, T3FREE, THYROIDAB in the last 72 hours. Anemia Panel: No results for input(s): VITAMINB12, FOLATE, FERRITIN, TIBC, IRON, RETICCTPCT in the last 72 hours. Sepsis Labs: Recent Labs  Lab 07/20/20 0448  PROCALCITON <0.10    Recent Results (from the past 240 hour(s))  CULTURE, BLOOD (ROUTINE X 2) w Reflex to ID Panel     Status: None   Collection Time: 07/16/20  5:36 PM   Specimen: BLOOD  Result Value Ref Range Status   Specimen Description BLOOD [facility]  Final   Special Requests   Final    BOTTLES DRAWN AEROBIC ONLY Blood Culture adequate volume   Culture   Final    NO GROWTH 5 DAYS Performed at [facility], [address] [place], [place] [address]    Report Status  07/21/2020 FINAL  Final  CULTURE, BLOOD (ROUTINE X 2) w Reflex to ID Panel     Status: None   Collection Time: 07/16/20  5:36 PM   Specimen: BLOOD  Result Value Ref Range Status   Specimen Description BLOOD [facility]  Final   Special Requests   Final    BOTTLES DRAWN AEROBIC AND ANAEROBIC Blood Culture adequate volume   Culture   Final    NO GROWTH 5 DAYS Performed at [facility], [address], [place], [place] [address]    Report Status 07/21/2020 FINAL  Final         Radiology Studies: DG Abd 1 View  Result Date: 07/22/2020 CLINICAL DATA:  Feeding tube repositioning. EXAM: X-RAY ABDOMEN 1 VIEW 5:07 p.m.: COMPARISON:  Abdomen x-ray earlier same day at 2:21 p.m. and at 7:08 a.m. FINDINGS: The tip of the feeding tube now projects over the expected location of the distal body of the stomach or the gastric antrum. Visualized bowel gas pattern unremarkable. IMPRESSION: Feeding tube tip now projects over the expected location of the distal body of the stomach or the gastric antrum. Electronically Signed   By: Hulan Saas M.D.   On: 07/22/2020 18:04   DG Abd 1 View  Result Date: 07/22/2020 CLINICAL DATA:  Feeding tube dysfunction EXAM: X-RAY ABDOMEN 1 VIEW COMPARISON:  Portable exam 1421 hours compared to 0708 hours FINDINGS: Tip of feeding tube projects over proximal stomach. Atherosclerotic calcification aorta. Nonobstructive bowel gas pattern. Minimal bibasilar atelectasis. IMPRESSION: Tip of feeding tube projects over proximal stomach. Electronically Signed   By: Ulyses Southward M.D.   On: 07/22/2020 15:22   DG Abd 1 View  Result Date: 07/22/2020 CLINICAL DATA:  Feeding tube positioning EXAM: X-RAY ABDOMEN 1 VIEW COMPARISON:  July 21, 2020. FINDINGS: Feeding tube tip is in the proximal stomach, unchanged from 1 day prior. There is moderate stool in the colon. Air is seen throughout the transverse and descending colonic regions. No focus suggesting bowel obstruction. No free  air evident. There are surgical clips in the upper abdomen. Lung bases are clear. IMPRESSION: Feeding tube tip in proximal stomach region. Air throughout much of the colon. A mild degree of colonic ileus cannot be excluded. No findings suggesting bowel obstruction. Lung bases clear. Electronically Signed   By: Bretta Bang III M.D.  On: 07/22/2020 09:30   DG Abd Portable 1V  Result Date: 07/21/2020 CLINICAL DATA:  Feeding tube placement EXAM: X-RAY ABDOMEN 1 VIEW COMPARISON:  Portable exam 1502 hours compared to CT abdomen and pelvis 07/20/2020 FINDINGS: Tip of feeding tube projects over proximal stomach. Scattered gas and stool throughout visualized colon without dilatation. Lung bases clear. Atherosclerotic calcification aorta. Bones demineralized. IMPRESSION: Tip of feeding tube projects over proximal stomach. Electronically Signed   By: Ulyses Southward M.D.   On: 07/21/2020 15:45   DG Naso G Tube Plc W/Fl W/Rad  Result Date: 07/23/2020 CLINICAL DATA:  Dobbhoff tube placement EXAM: NASO G TUBE PLACEMENT WITH FL AND WITH RAD CONTRAST:  10 mL Omnipaque 180 FLUOROSCOPY TIME:  Fluoroscopy Time:  4 minutes Radiation Exposure Index (if provided by the fluoroscopic device): 23.9 mGy Number of Acquired Spot Images: 0 COMPARISON:  CT abdomen 07/20/2020 FINDINGS: Ten French Dobbhoff tube was inserted through the right nare without difficulty. The tip of the Dobbhoff tube was advanced into the proximal portion of the gastric remanent. Patient has had a prior gastric bypass. The Dobbhoff tube could not be advanced any further. The Dobbhoff tube was in place with the tip in the proximal gastric remanent. The wire was removed. The tube was secured to the nose. IMPRESSION: Patient has had a prior gastric bypass. Dobbhoff tube was inserted and left in the proximal portion of the gastric remanent. The Dobbhoff tube could not be advanced any further. Electronically Signed   By: Elige Ko   On: 07/23/2020 09:27         Scheduled Meds: . Chlorhexidine Gluconate Cloth  6 each Topical Daily  . docusate sodium  100 mg Oral BID  . enoxaparin (LOVENOX) injection  40 mg Subcutaneous Q24H  . Ferrous Fumarate  1 tablet Oral BID  . insulin aspart  0-9 Units Subcutaneous Q4H  . lactulose  20 g Oral BID  . metoprolol tartrate  12.5 mg Oral BID  . [START ON 07/24/2020] multivitamin  15 mL Oral Daily  . pantoprazole (PROTONIX) IV  40 mg Intravenous Q12H  . rosuvastatin  20 mg Oral Daily  . sodium chloride flush  3 mL Intravenous Q12H  . thiamine injection  100 mg Intravenous Daily  . traZODone  25 mg Oral QHS   Continuous Infusions: . sodium chloride 10 mL/hr at 07/22/20 7493  . acyclovir 760 mg (07/23/20 0530)  . dextrose 5 % and 0.9 % NaCl with KCl 20 mEq/L 50 mL/hr at 07/22/20 1818  . doxycycline (VIBRAMYCIN) IV 100 mg (07/23/20 0047)     LOS: 10 days    Time spent: 45 minutes    Marrion Coy, MD [facility]   To contact the attending provider between 7A-7P or the covering provider during after hours 7P-7A, please log into the web site [email or web address] and access using universal [place] password for that web site. If you do not have the password, please call the hospital operator.  07/23/2020, 12:23 PM

## 2020-07-23 NOTE — Progress Notes (Signed)
ID Obtunded  Patient Vitals for the past 24 hrs:  BP Temp Temp src Pulse Resp SpO2 Weight  07/23/20 0800 139/75 99.8 F (37.7 C) Oral 76 18 100 % --  07/23/20 0500 -- -- -- -- -- -- 73 kg  07/23/20 0022 (!) 175/77 99.1 F (37.3 C) Oral 83 16 100 % --  o/e obtunded Opens eyes to calling Non verbal but screams when I touch her Moves limbs spontaneously Lower limbs restless Chest b/la ir entry S1S2 abd soft Foley catheter     CBC Latest Ref Rng & Units 07/22/2020 07/20/2020 07/19/2020  WBC 4.0 - 10.5 K/uL 7.1 6.2 6.9  Hemoglobin 12.0 - 15.0 g/dL 10.1(L) 10.8(L) 11.2(L)  Hematocrit 36 - 46 % 29.6(L) 32.3(L) 32.7(L)  Platelets 150 - 400 K/uL 290 271 280     CMP Latest Ref Rng & Units 07/22/2020 07/21/2020 07/20/2020  Glucose 70 - 99 mg/dL 010(U) 725(D) 664(Q)  BUN 8 - 23 mg/dL 12 12 9   Creatinine 0.44 - 1.00 mg/dL 0.34 7.42  Sodium 135 - 145 mmol/L 134(L) 131(L) 130(L)  Potassium 3.5 - 5.1 mmol/L 3.8 4.0 3.7  Chloride 98 - 111 mmol/L 103 99 96(L)  CO2 22 - 32 mmol/L 24 23 25   Calcium 8.9 - 10.3 mg/dL 8.9 9.3 5.95)  Total Protein 6.5 - 8.1 g/dL - - -  Total Bilirubin 0.3 - 1.2 mg/dL - - -  Alkaline Phos 38 - 126 U/L - - -  AST 15 - 41 U/L - - -  ALT 0 - 44 U/L - - -    Impressionrecommendation Presented with Fever, headache, painful rash on the rt side of the back and chest with radiating pain from back to front, confusion   Herpes zoster- one-( may be two) dermatomal involvement- on IV acyclovir  fever was low grade started Doxycycline for TBI like ehrlichia as was working in the yard as per her daughter. No obvious tick bites, no leucopenia or thrombocytopenia . received a dose of doxy on 9/2 . restarted on 07/20/20. TBI labs sent. No response to doxy on 07/23/20 - so will stop.   Encephalopathy-On admission she was alert and verbal-and she did not have features of encephalitis or bacterial meningitis clinically on 07/12/20  slow progression to obtunded state-- more  so since 07/18/20 D.D r/o viral encephalitis -- zoster VS other virus  R/o bacterial meningitis ( less likely) acyclovir induced neurotoxicity ( but has normal renal function) R/o NMDAR due to zoster Needs LP ? MRA As axillary temp now is 102 will send blood culture and start amp+ ceftriaxone  MRI with contrast normal Etiology multifactorial-  She could have aspetic meningitis ,( she had refused LP on admission) Acyclovir can cause encephalopathybut normal creatinine- Had switched to PO vatrex afet 5 days of IV , but restarted over the weekend as she was not taking PO reliably Oxycodonecan cause confusion- stopped She also has change in diurnal rhythm with sleeping day time and awake at night Also OSA but not wearing CPAP- but no co2 narcosis as per ABG Poor intake Urinary retention Constipation All contributing to the above  Appreciate neurologist evaluation. Spoke with Dr.Lindzen- delirium is the working diagnosis Would like to rule out intra-abdominal infection She has a collection thought to be psedocyst VS seroma VS bilioma secondary to a recent complicated stay at [facility] between 04/01/20 until 05/14/20 for abdominal pain which was diagnosed as choledocholithiasis/cholangitis in a setting of prior RYGB in 2008. Standard ERCP was not  attempted due to roux en Y and she was taken to OR on 04/05/20 for diagnostic lap, robotic lysis of adhesions, transgastric ERCP and biliary sphincterotomy, cholecystectomy, partial gastrectomy and accidental colotomy which was repaired.The immediate post op was complicated by post ERCP pancreatitis , pulmonary edema with acute hypoxic resp failure , Afib ( treated with amio and metoprolol), fever and leucocytosis suspected due to pancreatitis and was treated with zosyn, UTI due to proteus and acute on chronic anemia needing PRBC and AKI. She was seen by gerontologist for insomnia and risk for delirium  Rt sided abdominal firmness and fullness CT abdomen  showsFluid collection at lies along the posterior margin of the pancreas, predominantly collecting along the right anterior pararenal fascia. This may reflect an abscess. It could be a pseudocyst if there is a history of pancreatitis. It may be a postoperative collection, since the patient underwent a cholecystectomy since the prior CT. Collection measures approximately 14 x 4 x 10 cm in size. Because of ongoing delirium need to r/o infection Discussed with neurologist and then with IR Dr.Henn. As repeat CT shows hte collection which is thought o be either a pseudocyst, seroma or biloma and not abscess IR does not recommend aspiration.  As she  has temp may need aspiration if LP does not give the answer   Urine culture multiple species-received 5 days of ceftriaxone  Hearing loss   Left TKA  Discussed the management with the daughter, hospitalist and her nurse

## 2020-07-23 NOTE — Plan of Care (Signed)
  Problem: Education: Goal: Knowledge of General Education information will improve Description: Including pain rating scale, medication(s)/side effects and non-pharmacologic comfort measures Outcome: Progressing   Problem: Health Behavior/Discharge Planning: Goal: Ability to manage health-related needs will improve Outcome: Progressing   Problem: Clinical Measurements: Goal: Ability to maintain clinical measurements within normal limits will improve Outcome: Progressing Goal: Will remain free from infection Outcome: Progressing Goal: Diagnostic test results will improve Outcome: Progressing Goal: Respiratory complications will improve Outcome: Progressing Goal: Cardiovascular complication will be avoided Outcome: Progressing  PT RESPONSIVE WITH ONE WORD ANSWERS- ALERT WITH EYES OPEN - MUMBLES IN RESPONSE TO QUESTIONS - DENIES PAIN AND NO S/S OF PAIN NOTED - PT TO FLOURO THIS AM FOR NG TUBE CHANGE OUT

## 2020-07-23 NOTE — Care Management Important Message (Signed)
Important Message  Patient Details  Name: Casey Baldwin MRN: [id number] Date of Birth: [birth year]   Medicare Important Message Given:  Yes     [name] 07/23/2020, 12:01 PM

## 2020-07-24 ENCOUNTER — Inpatient Hospital Stay: Payer: Medicare Other

## 2020-07-24 DIAGNOSIS — R52 Pain, unspecified: Secondary | ICD-10-CM | POA: Diagnosis not present

## 2020-07-24 DIAGNOSIS — B029 Zoster without complications: Secondary | ICD-10-CM | POA: Diagnosis not present

## 2020-07-24 DIAGNOSIS — E440 Moderate protein-calorie malnutrition: Secondary | ICD-10-CM | POA: Insufficient documentation

## 2020-07-24 DIAGNOSIS — R509 Fever, unspecified: Secondary | ICD-10-CM | POA: Diagnosis not present

## 2020-07-24 DIAGNOSIS — R519 Headache, unspecified: Secondary | ICD-10-CM | POA: Diagnosis not present

## 2020-07-24 LAB — CBC WITH DIFFERENTIAL/PLATELET
Abs Immature Granulocytes: 0.03 10*3/uL (ref 0.00–0.07)
Basophils Absolute: 0 10*3/uL (ref 0.0–0.1)
Basophils Relative: 0 %
Eosinophils Absolute: 0.1 10*3/uL (ref 0.0–0.5)
Eosinophils Relative: 1 %
HCT: 30.3 % — ABNORMAL LOW (ref 36.0–46.0)
Hemoglobin: 10.5 g/dL — ABNORMAL LOW (ref 12.0–15.0)
Immature Granulocytes: 0 %
Lymphocytes Relative: 10 %
Lymphs Abs: 0.8 10*3/uL (ref 0.7–4.0)
MCH: 32.8 pg (ref 26.0–34.0)
MCHC: 34.7 g/dL (ref 30.0–36.0)
MCV: 94.7 fL (ref 80.0–100.0)
Monocytes Absolute: 0.9 10*3/uL (ref 0.1–1.0)
Monocytes Relative: 10 %
Neutro Abs: 6.8 10*3/uL (ref 1.7–7.7)
Neutrophils Relative %: 79 %
Platelets: 266 10*3/uL (ref 150–400)
RBC: 3.2 MIL/uL — ABNORMAL LOW (ref 3.87–5.11)
RDW: 13.1 % (ref 11.5–15.5)
WBC: 8.6 10*3/uL (ref 4.0–10.5)
nRBC: 0 % (ref 0.0–0.2)

## 2020-07-24 LAB — CSF CELL COUNT WITH DIFFERENTIAL
Eosinophils, CSF: 0 %
Eosinophils, CSF: 0 %
Lymphs, CSF: 89 %
Lymphs, CSF: 93 %
Monocyte-Macrophage-Spinal Fluid: 7 %
Monocyte-Macrophage-Spinal Fluid: 9 %
RBC Count, CSF: 0 /mm3 (ref 0–3)
RBC Count, CSF: 23 /mm3 — ABNORMAL HIGH (ref 0–3)
Segmented Neutrophils-CSF: 0 %
Segmented Neutrophils-CSF: 2 %
Tube #: 1
Tube #: 3
WBC, CSF: 271 /mm3 (ref 0–5)
WBC, CSF: 274 /mm3 (ref 0–5)

## 2020-07-24 LAB — GLUCOSE, CAPILLARY
Glucose-Capillary: 190 mg/dL — ABNORMAL HIGH (ref 70–99)
Glucose-Capillary: 168 mg/dL — ABNORMAL HIGH (ref 70–99)
Glucose-Capillary: 147 mg/dL — ABNORMAL HIGH (ref 70–99)
Glucose-Capillary: 141 mg/dL — ABNORMAL HIGH (ref 70–99)
Glucose-Capillary: 160 mg/dL — ABNORMAL HIGH (ref 70–99)

## 2020-07-24 LAB — VITAMIN B1: Vitamin B1 (Thiamine): 91.6 nmol/L (ref 66.5–200.0)

## 2020-07-24 LAB — BASIC METABOLIC PANEL
Anion gap: 9 (ref 5–15)
BUN: 12 mg/dL (ref 8–23)
CO2: 23 mmol/L (ref 22–32)
Calcium: 8.3 mg/dL — ABNORMAL LOW (ref 8.9–10.3)
Chloride: 93 mmol/L — ABNORMAL LOW (ref 98–111)
Creatinine, Ser: 0.76 mg/dL (ref 0.44–1.00)
GFR calc Af Amer: >60 mL/min (ref >60)
GFR calc non Af Amer: >60 mL/min (ref >60)
Glucose, Bld: 175 mg/dL — ABNORMAL HIGH (ref 70–99)
Potassium: 3.9 mmol/L (ref 3.5–5.1)
Sodium: 125 mmol/L — ABNORMAL LOW (ref 135–145)

## 2020-07-24 LAB — PATHOLOGIST SMEAR REVIEW

## 2020-07-24 LAB — PROTIME-INR
INR: 1.1 (ref 0.8–1.2)
Prothrombin Time: 13.4 seconds (ref 11.4–15.2)

## 2020-07-24 LAB — ROCKY MTN SPOTTED FVR ABS PNL(IGG+IGM)
RMSF IgG: NEGATIVE
RMSF IgM: 0.2 index (ref 0.00–0.89)

## 2020-07-24 LAB — GLUCOSE, CSF: Glucose, CSF: 72 mg/dL — ABNORMAL HIGH (ref 40–70)

## 2020-07-24 LAB — STREP PNEUMONIAE URINARY ANTIGEN: Strep Pneumo Urinary Antigen: NEGATIVE

## 2020-07-24 LAB — MAGNESIUM: Magnesium: 1.7 mg/dL (ref 1.7–2.4)

## 2020-07-24 LAB — APTT: aPTT: 25 seconds (ref 24–36)

## 2020-07-24 LAB — PROTEIN, CSF: Total  Protein, CSF: 233 mg/dL — ABNORMAL HIGH (ref 15–45)

## 2020-07-24 LAB — SODIUM
Sodium: 126 mmol/L — ABNORMAL LOW (ref 135–145)
Sodium: 127 mmol/L — ABNORMAL LOW (ref 135–145)

## 2020-07-24 LAB — PROCALCITONIN: Procalcitonin: <0.1 ng/mL

## 2020-07-24 LAB — PHOSPHORUS: Phosphorus: 3.4 mg/dL (ref 2.5–4.6)

## 2020-07-24 MED ORDER — LORAZEPAM 2 MG/ML IJ SOLN
0.5000 mg | Freq: Once | INTRAMUSCULAR | Status: AC
  Administered 2020-07-24: 0.5 mg via INTRAVENOUS
  Filled 2020-07-24: qty 1

## 2020-07-24 MED ORDER — FREE WATER
20.0000 mL | Status: DC
  Administered 2020-07-24 – 2020-07-29 (×30): 20 mL

## 2020-07-24 MED ORDER — LORAZEPAM 2 MG/ML IJ SOLN: 0.5000 mg | Freq: Once | INTRAVENOUS | Status: DC

## 2020-07-24 MED ORDER — LEVETIRACETAM IN NACL 1500 MG/100ML IV SOLN
1500.0000 mg | Freq: Once | INTRAVENOUS | Status: AC
  Administered 2020-07-24: 1500 mg via INTRAVENOUS
  Filled 2020-07-24 (×2): qty 100

## 2020-07-24 MED ORDER — VITAL AF 1.2 CAL PO LIQD
1000.0000 mL | ORAL | Status: DC
  Administered 2020-07-24 – 2020-08-13 (×13): 1000 mL

## 2020-07-24 MED ORDER — ADULT MULTIVITAMIN W/MINERALS CH
1.0000 | ORAL_TABLET | Freq: Every day | ORAL | Status: DC
  Administered 2020-07-25 – 2020-07-29 (×5): 1
  Filled 2020-07-24 (×6): qty 1

## 2020-07-24 MED ORDER — GADOBUTROL 1 MMOL/ML IV SOLN
7.0000 mL | Freq: Once | INTRAVENOUS | Status: AC | PRN
  Administered 2020-07-24: 7 mL via INTRAVENOUS

## 2020-07-24 MED ORDER — CHLOROPROCAINE HCL 2 % IJ SOLN
7.0000 mL | Freq: Once | INTRAMUSCULAR | Status: AC
  Administered 2020-07-24: 7 mL
  Filled 2020-07-24: qty 30

## 2020-07-24 NOTE — Progress Notes (Addendum)
PROGRESS NOTE    Casey Baldwin  DVV:[id number] DOB: April 28, 1941 DOA: 07/12/2020 PCP: Lesly Rubenstein, MD   Chief complaint.  Altered mental status. 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 Rash is intensely pruritic different stages of healing some areas of vesicular be red border. Some areas covered in black eschars. Skin is diffusely tender.Per family (asked by me) pt developed confusion. Was c/o side to back pain.  Sed rate was elevated. LP was considered but patient declined stating that she had a history of meningitis in the past and very scared of having LP done. Hospitalist was called for admission for shingles and possible encephalitis.  9/8.IV acyclovir was changed to Valtrex. Patient has not had a bowel movement since admission, has poor appetite. Giving lactulose, increase the Protonix to twice a day.  9/9.Patient refused CPAP last night, she became more confused today. She also has reversed sleeping cycle. Added Seroquel 25 mg evening. Schedule lactulose twice a day for constipation.  9/10.Patient slept last night with Seroquel. Still very confused and fatigued.Seen by neurologist, suspect metabolic source. Repeated CT scan of the abdomen/pelvis, intra-abdominal fluid collection still present.  9/13.  Dobbhoff placed, tube feeding started.  Also consult psychiatry for 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  9/14.  Patient mental status not improving.  Had a fever of 102 yesterday evening.  ID restart antibiotics with Rocephin and ampicillin.  LP is performed today.      Assessment & Plan:   Active Problems:   SIRS (systemic inflammatory response syndrome) (HCC)   Shingles rash   Delirium   Acute metabolic encephalopathy   Essential hypertension   Anemia   Acute lower UTI   Chronic diastolic CHF (congestive heart failure) (HCC)   Sepsis (HCC)   Change in mental status   Anorexia  #1.  Acute metabolic cephalopathy. Patient is spiked a fever last evening, making infection unlikely.  Discussed with Dr. Avon Gully, will obtain LP study.  If LP is negative, will consider aspiration of intra-abdominal fluid collection. Blood culture was repeated. Continue trazodone, Haldol as needed, thiamine IV.  #2.  Shingles with aseptic meningitis. Patient is still on acyclovir IV, may consider change to oral Valtrex to reduce with D5 infusion due to hypokalemia.  Discussed with Dr. Avon Gully.  #3.  Anorexia with poor p.o. intake. Continue tube feeding via Dobbhoff catheter. Decrease free water for hyponatremia.  4.  Hyponatremia. Likely due to SIADH, patient does not have volume overload, does not seem to have dehydration.  Will decrease the free water in tube feeding.  I spoke with pharmacy, will change all IV antibiotic  infusions to normal saline, check sodium level every 6 hours, if continues to drop, will start 3% saline as hyponatremia may affect patient mental status.  5.  Sepsis secondary to UTI and viral infection. Condition improved.  6.  Intra-abdominal fluid collection. Initially deemed to be noninfectious, with recurrent fever, will consider perform aspiration for study if LP study come back normal.  7.  Urinary retention. Continue Foley catheter.  8.  Urinary tract infection. Resolved.  9.  Chronic diastolic congestive heart failure. No volume overload.   Addendum:  LP  is performed.  Study consistent with viral meningitis/encephalitis.  Discussed with infect disease and neurology, neurology concerned for subacute seizure.  MRI  of the brain and MRA will be performed.  EEG is also ordered.  However, patient will need a persistent EEG monitoring, initiate as process to transfer patient to a tertiary hospital.  I have started the process with [facility] per family request.  They do not want to transfer to [facility].  They are aware that the transfer process may take several days.  Also started seizure medicine per neurology. Discussed with DR. Veal from [facility] neurology, will arrange for transfer.   DVT prophylaxis: CSDs for now Code Status: Full Family Communication: Long discussion with daughter, all questions answered.  .   Status is: Inpatient  Remains inpatient appropriate because:Inpatient level of care appropriate due to severity of illness   Dispo: The patient is from: Home              Anticipated d/c is to: SNF              Anticipated d/c date is: > 3 days              Patient currently is not medically stable to d/c.        I/O last 3 completed shifts: In: 1276.7 [P.O.:30; I.V.:196.2; IV Piggyback:1050.6] Out: 1600 [Urine:1600] No intake/output data recorded.     Consultants:   ID, neurology.  Procedures: LD  Antimicrobials:  Ampicillin and Rocephin.  Subjective: Patient mental status is not improving.  She is responding to stimuli and opening eyes, not able to answer questions.   She had a fever of 102 axillary yesterday evening. She still have Foley catheter for urinary retention. Poor appetite, receiving tube feeding.  No nausea vomiting or abdominal pain.  Objective: Vitals:   07/23/20 2100 07/23/20 2324 07/24/20 0500 07/24/20 0814  BP: (!) 156/80 (!) 148/73    Pulse:  87    Resp:  16    Temp:  98.2 F (36.8 C)  99.8 F (37.7 C)  TempSrc:  Oral    SpO2:  99%    Weight:   75.3 kg   Height:        Intake/Output Summary (Last 24 hours) at 07/24/2020 0915 Last data filed at 07/24/2020 0600 Gross per 24 hour  Intake 1246.74 ml  Output 950 ml  Net 296.74 ml    Filed Weights   07/22/20 0421 07/23/20 0500 07/24/20 0500  Weight: 74.4 kg 73 kg 75.3 kg    Examination:  General exam: Ill-appearing, no distress. Respiratory system: Clear to auscultation. Respiratory effort normal. Cardiovascular system: S1 & S2 heard, RRR. No JVD, murmurs, rubs, gallops or clicks. No pedal edema. Gastrointestinal system: Abdomen is nondistended, soft and nontender. No organomegaly or masses felt. Normal bowel sounds heard. Central nervous system: Alert and confused. No focal neurological deficits. Extremities: Symmetric  Skin: No rashes, lesions or ulcers    Data Reviewed: I have personally reviewed following labs and imaging studies  CBC: Recent Labs  Lab 07/19/20 0533 07/20/20 0448 07/22/20 0439 07/23/20 1939 07/24/20 0705  WBC 6.9 6.2 7.1 9.8 8.6  NEUTROABS 4.9 4.1 5.3 7.4 6.8  HGB 11.2* 10.8* 10.1* 11.1* 10.5*  HCT 32.7* 32.3* 29.6* 31.2* 30.3*  MCV 93.7 96.4 96.4 91.2 94.7  PLT 280 271 290 311 266   Basic Metabolic Panel: Recent Labs  Lab 07/19/20 0533 07/19/20 0533 07/20/20 0448 07/21/20 0434 07/22/20 0439 07/23/20 1939 07/24/20  0705  NA 131*   < > 130* 131* 134* 127* 125*  K 3.4*   < > 3.7 4.0 3.8 3.6 3.9  CL 96*   < > 96* 99 103 92* 93*  CO2 23   < > 25 23 24 24 23   GLUCOSE 130*   < > 134* 160* 145* 143* 175*  BUN 11   < > 9 12 12 12 12   CREATININE 0.76   < > 0.81 0.96 0.79 0.62 0.76  CALCIUM 8.8*   < > 8.8* 9.3 8.9 9.2 8.3*  MG 2.1  --  1.9 2.0 1.9  --  1.7  PHOS 3.3  --   --  4.7*  --   --  3.4   < > = values in this interval not displayed.   GFR: Estimated Creatinine Clearance: 55.7 mL/min (by C-G formula based on SCr of 0.76 mg/dL). Liver Function Tests: Recent Labs  Lab 07/23/20 1939  AST 27  ALT 29  ALKPHOS 71  BILITOT 0.8  PROT 7.0  ALBUMIN 3.3*   No results for input(s): LIPASE, AMYLASE in the last 168 hours. No results for input(s): AMMONIA in the last 168 hours. Coagulation Profile: Recent Labs  Lab  07/24/20 0822  INR 1.1   Cardiac Enzymes: No results for input(s): CKTOTAL, CKMB, CKMBINDEX, TROPONINI in the last 168 hours. BNP (last 3 results) No results for input(s): PROBNP in the last 8760 hours. HbA1C: No results for input(s): HGBA1C in the last 72 hours. CBG: Recent Labs  Lab 07/23/20 1953 07/23/20 2357 07/24/20 0207 07/24/20 0404 07/24/20 0818  GLUCAP 140* 198* 190* 168* 147*   Lipid Profile: No results for input(s): CHOL, HDL, LDLCALC, TRIG, CHOLHDL, LDLDIRECT in the last 72 hours. Thyroid Function Tests: No results for input(s): TSH, T4TOTAL, FREET4, T3FREE, THYROIDAB in the last 72 hours. Anemia Panel: No results for input(s): VITAMINB12, FOLATE, FERRITIN, TIBC, IRON, RETICCTPCT in the last 72 hours. Sepsis Labs: Recent Labs  Lab 07/20/20 0448 07/22/20 0439 07/24/20 0705  PROCALCITON <0.10 <0.10 <0.10    Recent Results (from the past 240 hour(s))  CULTURE, BLOOD (ROUTINE X 2) w Reflex to ID Panel     Status: None   Collection Time: 07/16/20  5:36 PM   Specimen: BLOOD  Result Value Ref Range Status   Specimen Description BLOOD [facility]  Final   Special Requests   Final    BOTTLES DRAWN AEROBIC ONLY Blood Culture adequate volume   Culture   Final    NO GROWTH 5 DAYS Performed at [facility], [address] [place], [address] [place]    Report Status 07/21/2020 FINAL  Final  CULTURE, BLOOD (ROUTINE X 2) w Reflex to ID Panel     Status: None   Collection Time: 07/16/20  5:36 PM   Specimen: BLOOD  Result Value Ref Range Status   Specimen Description BLOOD [facility]  Final   Special Requests   Final    BOTTLES DRAWN AEROBIC AND ANAEROBIC Blood Culture adequate volume   Culture   Final    NO GROWTH 5 DAYS Performed at [facility], [address] [place], [address] [place]    Report Status 07/21/2020 FINAL  Final  CULTURE, BLOOD (ROUTINE X 2) w Reflex to ID Panel     Status: None (Preliminary result)   Collection Time: 07/23/20  7:46 PM     Specimen: BLOOD  Result Value Ref Range Status   Specimen Description BLOOD LEFT AC  Final  Special Requests   Final    BOTTLES DRAWN AEROBIC AND ANAEROBIC Blood Culture adequate volume   Culture   Final    NO GROWTH < 12 HOURS Performed at [facility], [address] [place], [place] [address]    Report Status PENDING  Incomplete  CULTURE, BLOOD (ROUTINE X 2) w Reflex to ID Panel     Status: None (Preliminary result)   Collection Time: 07/23/20  7:47 PM   Specimen: BLOOD  Result Value Ref Range Status   Specimen Description BLOOD RIGHT [facility]  Final   Special Requests   Final    BOTTLES DRAWN AEROBIC AND ANAEROBIC Blood Culture adequate volume   Culture   Final    NO GROWTH < 12 HOURS Performed at [facility], [address] [place], [place] [address]    Report Status PENDING  Incomplete         Radiology Studies: DG Abd 1 View  Result Date: 07/22/2020 CLINICAL DATA:  Feeding tube repositioning. EXAM: X-RAY ABDOMEN 1 VIEW 5:07 p.m.: COMPARISON:  Abdomen x-ray earlier same day at 2:21 p.m. and at 7:08 a.m. FINDINGS: The tip of the feeding tube now projects over the expected location of the distal body of the stomach or the gastric antrum. Visualized bowel gas pattern unremarkable. IMPRESSION: Feeding tube tip now projects over the expected location of the distal body of the stomach or the gastric antrum. Electronically Signed   By: Hulan Saashomas  Lawrence M.D.   On: 07/22/2020 18:04   DG Abd 1 View  Result Date: 07/22/2020 CLINICAL DATA:  Feeding tube dysfunction EXAM: X-RAY ABDOMEN 1 VIEW COMPARISON:  Portable exam 1421 hours compared to 0708 hours FINDINGS: Tip of feeding tube projects over proximal stomach. Atherosclerotic calcification aorta. Nonobstructive bowel gas pattern. Minimal bibasilar atelectasis. IMPRESSION: Tip of feeding tube projects over proximal stomach. Electronically Signed   By: Ulyses SouthwardMark  Boles M.D.   On: 07/22/2020 15:22   DG Naso G Tube Plc  W/Fl W/Rad  Result Date: 07/23/2020 CLINICAL DATA:  Dobbhoff tube placement EXAM: NASO G TUBE PLACEMENT WITH FL AND WITH RAD CONTRAST:  10 mL Omnipaque 180 FLUOROSCOPY TIME:  Fluoroscopy Time:  4 minutes Radiation Exposure Index (if provided by the fluoroscopic device): 23.9 mGy Number of Acquired Spot Images: 0 COMPARISON:  CT abdomen 07/20/2020 FINDINGS: Ten French Dobbhoff tube was inserted through the right nare without difficulty. The tip of the Dobbhoff tube was advanced into the proximal portion of the gastric remanent. Patient has had a prior gastric bypass. The Dobbhoff tube could not be advanced any further. The Dobbhoff tube was in place with the tip in the proximal gastric remanent. The wire was removed. The tube was secured to the nose. IMPRESSION: Patient has had a prior gastric bypass. Dobbhoff tube was inserted and left in the proximal portion of the gastric remanent. The Dobbhoff tube could not be advanced any further. Electronically Signed   By: Elige KoHetal  Patel   On: 07/23/2020 09:27        Scheduled Meds: . Chlorhexidine Gluconate Cloth  6 each Topical Daily  . docusate sodium  100 mg Oral BID  . Ferrous Fumarate  1 tablet Oral BID  . insulin aspart  0-9 Units Subcutaneous Q4H  . lactulose  20 g Oral BID  . LORazepam  0.5 mg Intravenous Once  . metoprolol tartrate  12.5 mg Oral BID  . multivitamin  15 mL Oral Daily  . pantoprazole (PROTONIX) IV  40 mg Intravenous Q12H  .  rosuvastatin  20 mg Oral Daily  . sodium chloride flush  3 mL Intravenous Q12H  . tamsulosin  0.4 mg Oral QPC supper  . thiamine injection  100 mg Intravenous Daily  . traZODone  25 mg Oral QHS   Continuous Infusions: . sodium chloride 10 mL/hr at 07/22/20 4097  . acyclovir 760 mg (07/24/20 0514)  . ampicillin (OMNIPEN) IV 2 g (07/24/20 0905)  . cefTRIAXone (ROCEPHIN)  IV 2 g (07/23/20 2311)     LOS: 11 days    Time spent: 46 minutes    Marrion Coy, MD [facility]   To contact the  attending provider between 7A-7P or the covering provider during after hours 7P-7A, please log into the web site [email or web address] and access using universal [facility] password for that web site. If you do not have the password, please call the hospital operator.  07/24/2020, 9:15 AM

## 2020-07-24 NOTE — Progress Notes (Signed)
Patient Vitals for the past 24 hrs:  BP Temp Temp src Pulse Resp SpO2 Weight  07/24/20 1537 (!) 151/67 98.1 F (36.7 C) Oral 79 20 100 % --  07/24/20 1010 (!) 151/76 (!) 100.5 F (38.1 C) Oral 86 20 100 % --  07/24/20 0814 -- 99.8 F (37.7 C) -- -- -- -- --  07/24/20 0500 -- -- -- -- -- -- 75.3 kg  07/23/20 2324 (!) 148/73 98.2 F (36.8 C) Oral 87 16 99 % --  07/23/20 2100 (!) 156/80 -- -- -- -- -- --  o/e obtunded Opens eyes to calling Non verbal but screams when I touch her Moves limbs spontaneously Lower limbs restless Chest b/la ir entry S1S2 abd soft Foley catheter Lesions on the back has dried up     CBC Latest Ref Rng & Units 07/24/2020 07/23/2020 07/22/2020  WBC 4.0 - 10.5 K/uL 8.6 9.8 7.1  Hemoglobin 12.0 - 15.0 g/dL 10.5(L) 11.1(L) 10.1(L)  Hematocrit 36 - 46 % 30.3(L) 31.2(L) 29.6(L)  Platelets 150 - 400 K/uL 266 311 290     CMP Latest Ref Rng & Units 07/24/2020 07/24/2020 07/24/2020  Glucose 70 - 99 mg/dL - - 683(M)  BUN 8 - 23 mg/dL - - 12  Creatinine 1.96 - 1.00 mg/dL - - 2.22  Sodium 979 - 145 mmol/L 127(L) 126(L) 125(L)  Potassium 3.5 - 5.1 mmol/L - - 3.9  Chloride 98 - 111 mmol/L - - 93(L)  CO2 22 - 32 mmol/L - - 23  Calcium 8.9 - 10.3 mg/dL - - 8.3(L)  Total Protein 6.5 - 8.1 g/dL - - -  Total Bilirubin 0.3 - 1.2 mg/dL - - -  Alkaline Phos 38 - 126 U/L - - -  AST 15 - 41 U/L - - -  ALT 0 - 44 U/L - - -    Impressionrecommendation Presented with Fever, headache, painful rash on the rt side of the back and chest with radiating pain from back to front, confusion   Herpes zoster- one- dermatomal involvement- on IV acyclovir  started Doxycycline for TBI like ehrlichia as was working in the yard as per her daughter. No obvious tick bites, no leucopenia or thrombocytopenia . . r 07/20/20>>9/13. TBI labs sent. No response to doxy stopped on 07/23/20 - .   Encephalopathy-On admission she was alert and verbal-and she did not have features of  encephalitis or bacterial meningitis clinically on 07/12/20  slow progression to obtunded state-- more so since 07/18/20 D.D r/o viral encephalitis -- zoster VS other virus  R/o bacterial meningitis ( less likely) acyclovir induced neurotoxicity ( but has normal renal function) R/o NMDAR due to zoster As fever on 9/13 started on meningitis dose of ceftriaxone and ampicillin  LP done 9/14- lymphocytic pleocytosis with protein of 222 MRI with contrast normal on 9/2/21Will repeat do MRI/MRA to r/o encephalitis, vasculopathy, infarcts Will DC ceftriaxone and ampicillin as this is not bacterial meningitis   Oxycodonecan cause confusion- stopped She also has change in diurnal rhythm with sleeping day time and awake at night Also OSA but not wearing CPAP- but no co2 narcosis as per ABG Poor intake Urinary retention Constipation All contributing to the above   She has an intra-abdominal  collection thought to be psedocyst VS seroma VS bilioma secondary to a recent complicated stay at [facility] between 04/01/20 until 05/14/20 for abdominal pain which was diagnosed as choledocholithiasis/cholangitis in a setting of prior RYGB in 2008. Standard ERCP was not attempted due to  roux en Y and she was taken to OR on 04/05/20 for diagnostic lap, robotic lysis of adhesions, transgastric ERCP and biliary sphincterotomy, cholecystectomy, partial gastrectomy and accidental colotomy which was repaired.The immediate post op was complicated by post ERCP pancreatitis , pulmonary edema with acute hypoxic resp failure , Afib ( treated with amio and metoprolol), fever and leucocytosis suspected due to pancreatitis and was treated with zosyn, UTI due to proteus and acute on chronic anemia needing PRBC and AKI. She was seen by gerontologist for insomnia and risk for delirium  Rt sided abdominal firmness and fullness CT abdomen showsFluid collection at lies along the posterior margin of the pancreas, predominantly collecting along  the right anterior pararenal fascia. This may reflect an abscess. It could be a pseudocyst if there is a history of pancreatitis. It may be a postoperative collection, since the patient underwent a cholecystectomy since the prior CT. Collection measures approximately 14 x 4 x 10 cm in size. Because of ongoing delirium need to r/o infection Discussed with neurologist and then with IR Dr.Henn. As repeat CT shows the collection which is thought o be either a pseudocyst, seroma or biloma and not abscess IR does not recommend aspiration.    Urine culture multiple species-received 5 days of ceftriaxone  Hearing loss   Left TKA  Discussed the management with the daughter, hospitalist, neurologist  and her nurse

## 2020-07-24 NOTE — Procedures (Signed)
LP performed at L2-L3.  Opening pressure 12 cm H2O.  10.5 mL CSF obtained, slightly yellowish tinge.  No complications.  Please see full report in PACS for additional details.

## 2020-07-24 NOTE — Progress Notes (Signed)
Occupational Therapy Treatment Patient Details Name: Casey Baldwin MRN: [id number] DOB: 1941/01/21 Today's Date: 07/24/2020    History of present illness Casey Baldwin is a 79 y.o. 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  Presented to ER secondary to generalized body aches, progressive weakness, headaches; admitted for management of SIRS (urine?), shingles and acute metabolic encephalopathy.   OT comments  Pt supine in bed and lethargic with daughter present in room. Initially daughter declines intervention for pt but then requests assistance to attempt to alert pt this morning. OT placing warm wash cloth on pt's face and she moans but does open eyes. Pt does verbalize a few words and appears to report pain but unable to determine where. Hand over hand needed to initiate any level of care this morning and pt is unable to follow simple 1 step commands. Use of lemon swab in mouth to alert pt once more but unable to engage in any functional tasks. Caregiver expresses concerns and RN reports plan for lumbar puncture later today. OT will follow up when pt is more able to participate. OT recommendations has changed to SNF based on pt's recent increase in confusion, lethargy, and increase in assist to care for pt at this time.   Follow Up Recommendations  SNF    Equipment Recommendations  Other (comment) (defer to next venue of care)       Precautions / Restrictions Precautions Precautions: Fall       Mobility Bed Mobility      General bed mobility comments: remained in bed secondary to lethargy  Transfers      General transfer comment: deferred for safety        ADL either performed or assessed with clinical judgement   ADL       Grooming: Wash/dry hands;Wash/dry face;Oral care;Total assistance Grooming Details (indicate cue type and reason): hand over hand assist                     Cognition Arousal/Alertness: Lethargic Behavior During Therapy: WFL for tasks assessed/performed Overall Cognitive Status: Impaired/Different from baseline Area of Impairment: Attention;Memory;Following commands;Safety/judgement;Awareness;Problem solving      Orientation Level: Person Current Attention Level: Focused Memory: Decreased recall of precautions;Decreased short-term memory   Safety/Judgement: Decreased awareness of safety;Decreased awareness of deficits Awareness: Intellectual Problem Solving: Slow processing;Decreased initiation;Difficulty sequencing;Requires verbal cues;Requires tactile cues General Comments: pt unable to follow commands and is globally confused. She appears lethargic and does verbalize a few words but difficult to understand. Pt is unable to communicate needs at this time.                   Pertinent Vitals/ Pain       Pain Assessment: Faces Faces Pain Scale: Hurts little more Pain Location: generalized Pain Descriptors / Indicators: Grimacing;Guarding Pain Intervention(s): Limited activity within patient's tolerance;Monitored during session;Repositioned;Premedicated before session         Frequency  Min 1X/week        Progress Toward Goals  OT Goals(current goals can now be found in the care plan section)  Progress towards OT goals: Not progressing toward goals - comment (lethargic)  Acute Rehab OT Goals Patient Stated Goal: "can you wake her up" OT Goal Formulation: With family Time For Goal Achievement: 07/27/20 Potential to Achieve Goals: Fair  Plan Frequency remains appropriate;Discharge plan needs to be updated       AM-PAC OT "6  Clicks" Daily Activity     Outcome Measure   Help from another person eating meals?: A Lot Help from another person taking care of personal grooming?: A Lot Help from another person toileting, which includes using toliet, bedpan, or urinal?: Total Help from another person bathing (including  washing, rinsing, drying)?: Total Help from another person to put on and taking off regular upper body clothing?: A Lot Help from another person to put on and taking off regular lower body clothing?: Total 6 Click Score: 9    End of Session    OT Visit Diagnosis: Other abnormalities of gait and mobility (R26.89)   Activity Tolerance Patient limited by fatigue;Patient limited by lethargy   Patient Left in bed;with call bell/phone within reach;with bed alarm set;with family/visitor present   Nurse Communication Other (comment) (lethargy)        Time: 7169-6789 OT Time Calculation (min): 13 min  Charges: OT General Charges $OT Visit: 1 Visit OT Treatments $Self Care/Home Management : 8-22 mins  Jackquline Denmark, MS, OTR/L , CBIS ascom [phone]  07/24/20, 10:10 AM

## 2020-07-24 NOTE — Progress Notes (Signed)
NEUROLOGY CONSULTATION PROGRESS NOTE   Date of service: July 24, 2020 Patient Name: Casey Baldwin MRN:  [id number] DOB:  Jan 23, 1941  Brief HPI   79 y/o F admitted on 9/2 with 3 day hx of bodycahes, headache, fever, with dermatomal pruritic vesicular rash involving Right upper back and R breast. Was awake, alert at presentation, answering questions and following commands. Was started on Acyclovir and initially offered LP but refused. Continued on Acyclovir or 5 days before transitioning to Valacyclovir for about 48 hours, then back on Acyclovir. Workup with MRI Brain with and without contrast with no acute abnormality, no contrast enhancement, notable for generalized atrophy. She had worsening AMS about 5 days into hospitalization. Initially thought to be delirium given her prior hx of delirium. On discussion with daughter, this is much different than prior delirium where she had visual hallucinations and was waxing and waning. This time around, she seems to be going downhill and now just moaning and now following commands, not recognizing family at bedside. LP was obtained today with CSF notable for lymphocytic pleocytosis with WBC count of 274 and 90% lymphocytes. Elevated CSF protein to 233 and normal glucose. Opening pressure of 12 cmH20. Rest of the viral studies and autoimmune panel is pending. Prelim CSF smear and cultures with no organisms seen.  Patient was initially seen by neurology prior to LP and thought to be delirium due to multiple medical comorbidities.   Interval Hx   Encephalopathic, unable to provide any meaning ful hx. Daughter endorses that she may have some waxing and waning but has significantly deteriorated over the last few days. Patient is moaning to soft touch.  Past History   Past Medical History:  Diagnosis Date  . Coronary artery disease   . High cholesterol   . Hypertension    Past Surgical History:  Procedure Laterality Date  . ABDOMINAL HYSTERECTOMY     . COLON RESECTION    . FACIAL COSMETIC SURGERY    . FRACTURE SURGERY     left leg and left ankle.  Marland Kitchen GASTRIC BYPASS    . left knee replacement     Family History  Problem Relation Age of Onset  . Colon cancer Mother    Social History   Socioeconomic History  . Marital status: Widowed    Spouse name: Not on file  . Number of children: Not on file  . Years of education: Not on file  . Highest education level: Not on file  Occupational History  . Not on file  Tobacco Use  . Smoking status: Never Smoker  . Smokeless tobacco: Never Used  Vaping Use  . Vaping Use: Never used  Substance and Sexual Activity  . Alcohol use: No  . Drug use: No  . Sexual activity: Not on file  Other Topics Concern  . Not on file  Social History Narrative  . Not on file   Social Determinants of Health   Financial Resource Strain:   . Difficulty of Paying Living Expenses: Not on file  Food Insecurity:   . Worried About Programme researcher, broadcasting/film/video in the Last Year: Not on file  . Ran Out of Food in the Last Year: Not on file  Transportation Needs:   . Lack of Transportation (Medical): Not on file  . Lack of Transportation (Non-Medical): Not on file  Physical Activity:   . Days of Exercise per Week: Not on file  . Minutes of Exercise per Session: Not on file  Stress:   .  Feeling of Stress : Not on file  Social Connections:   . Frequency of Communication with Friends and Family: Not on file  . Frequency of Social Gatherings with Friends and Family: Not on file  . Attends Religious Services: Not on file  . Active Member of Clubs or Organizations: Not on file  . Attends BankerClub or Organization Meetings: Not on file  . Marital Status: Not on file   Allergies  Allergen Reactions  . Lidocaine Anaphylaxis and Other (See Comments)    Became unconscious with administration for dental procedure at age 79yo. Pt tolerated subsequent lidocaine.    . Amlodipine Other (See Comments)    Hand and leg cramping   . Lactose Diarrhea  . Lactose Intolerance (Gi)   . Latex     Medications   Medications Prior to Admission  Medication Sig Dispense Refill Last Dose  . Acetaminophen 500 MG capsule Take 500 mg by mouth daily as needed.      . Artificial Tear Ointment (DRY EYES OP) Place 1-2 drops into both eyes daily as needed.     Marland Kitchen. aspirin 81 MG EC tablet Take 81 mg by mouth daily.      . bisacodyl (DULCOLAX) 5 MG EC tablet Take 10 mg by mouth at bedtime.      . calcium carbonate (OS-CAL) 1250 (500 Ca) MG chewable tablet Chew 2 tablets by mouth daily.     . Cholecalciferol (VITAMIN D PO) Take 1 tablet by mouth daily.     . furosemide (LASIX) 20 MG tablet Take 20-40 mg by mouth daily. CAN TAKE 40MG  FOR SWELLING     . isosorbide mononitrate (IMDUR) 30 MG 24 hr tablet Take 30 mg by mouth daily.     . Multiple Vitamin (MULTIVITAMIN WITH MINERALS) TABS tablet Take 1 tablet by mouth daily.     . Multiple Vitamins-Minerals (ICAPS AREDS 2 PO) Take 1 capsule by mouth daily.     . ondansetron (ZOFRAN) 4 MG tablet Take 4 mg by mouth daily as needed.     . pantoprazole (PROTONIX) 20 MG tablet Take 20 mg by mouth daily.     . rosuvastatin (CRESTOR) 20 MG tablet Take 20 mg by mouth daily.     Marland Kitchen. senna-docusate (SENOKOT-S) 8.6-50 MG tablet Take 2 tablets by mouth 2 (two) times daily. (Patient not taking: Reported on 04/01/2020) 120 tablet 0   . witch hazel-glycerin (TUCKS) pad Apply 1 application topically as needed for itching. (Patient not taking: Reported on 07/10/2020) 40 each 12      Vitals  Temp:  [98.1 F (36.7 C)-100.5 F (38.1 C)] 98.1 F (36.7 C) (09/14 1537) Pulse Rate:  [79-87] 79 (09/14 1537) Resp:  [16-20] 20 (09/14 1537) BP: (148-156)/(67-80) 151/67 (09/14 1537) SpO2:  [99 %-100 %] 100 % (09/14 1537) Weight:  [75.3 kg] 75.3 kg (09/14 0500)  Body mass index is 28.49 kg/m.  Physical Exam    Neurologic Examination  Mental status/Cognition: Eyes open, briefly looks at my face, moaning to light  touch and in response to any movement. Does not answer any question, does not follow any commands. Vocalizes and attempts to pull out the Qtip on nares stimulation with her LUE. Speech/language: Keeps repeating Oh my God to any movement. Cranial nerves:   CN II Pupils equal and reactive to light, no VF deficits   CN III,IV,VI No gaze preference, EOMI intact to dolls eyes   CN V    CN VII Symmetric facial grimace to nares  stimulation.   CN VIII    CN IX & X    CN XI    CN XII    Motor:  Muscle bulk: normal, mildly increased tone. Moves all extremities spontaneously. Reflexes:  Right Left Comments  Pectoralis      Biceps (C5/6) 2 2   Brachioradialis (C5/6) 2 2    Triceps (C6/7) 2 2    Patellar (L3/4) 2 2    Achilles (S1) 1 1    Hoffman      Plantar     Jaw jerk    Sensation:  Light touch Withdraws all extremities to nailbed pressure.   Pin prick    Temperature    Vibration   Proprioception    Coordination/Complex Motor:  Unable to assess.  Labs   Lab Results  Component Value Date   NA 127 (L) 07/24/2020   K 3.9 07/24/2020   CL 93 (L) 07/24/2020   CO2 23 07/24/2020   GLUCOSE 175 (H) 07/24/2020   BUN 12 07/24/2020   CREATININE 0.76 07/24/2020   CALCIUM 8.3 (L) 07/24/2020   ALBUMIN 3.3 (L) 07/23/2020   AST 27 07/23/2020   ALT 29 07/23/2020   ALKPHOS 71 07/23/2020   BILITOT 0.8 07/23/2020   GFRNONAA >60 07/24/2020   GFRAA >60 07/24/2020     Imaging and Diagnostic studies  MRI Brain with and without contrast on 07/13/20: IMPRESSION: 1. No acute intracranial abnormality. 2. Generalized atrophy without lobar predilection.  Impression   Casey Baldwin is a 79 y.o. 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 Initially had intact mentation and refused LP. MRI brain with no hyperintensity, no contrast  enhancement and therefore thought to be delirium. She had worsening mentation over the last few days with fever with Tmax of 102 despite Acyclovir so we were asked to assess her again. Neuro exam with AMS, moaning, not following commands. LP with CSF lymphoytic pleocytosis and elevated protein, normal glucose. Multple CSF studies are pending. Differential for AMS is broad and althou delirium can certainly cause waxing and waning encephalopathy but there are several red flags in her case that stand out. Specifically the CSF findings and persistent fever despite treatment with Acyclovir.  She needs further workup to rule out subclinical seizures which can present with encephalopathy. Autoimmune encephalitis is another consideration that I would entertain. Specially considering that she was improving initially and had subsequent deterioration of her mentation a few days into admission. Althou post viral NMDA  encephalitis due to molecular mimicry is well defined in HSV encephalitis with about 20-30% seroconversion, it is much rare in the setting of Zoster, thou certainly possible and there are documented case reports.  At this time, I would recommend transfer to a center where she can get a cEEG to rule out subclinical seizures. Would also recommend repeat MRI Brain with and without contrast and MR Angio head to eval for any hyperintensity, contrast enhancement, vasculitic process. Certainly if the rest of the CSF studies are negative, cEEG with no seizures, it is reasonable to consider a trial of high dose steroids at that time.  Recommendations  - Transfer to a higher level of care for cEEG - daughter prefers transfer to [facility] where patient has had much of her care. - I ordered rEEG - in case patient is still here tomorrow, unfortunately due to personnel issues we  do not have the ability to obtain STAT EEGs. - I ordered Keppra 1500mg  IV load once - I ordered Autoimmune encephalitis panel on CSF along with  oligoclonal bands and IgG index. - Agree with ID regarding workup for fungal and other viral, arboviral, crytococcus, lyme, syphilis and atypical causes of meningitis. ______________________________________________________________________   Thank you for the opportunity to take part in the care of this patient. If you have any further questions, please contact the neurology consultation attending.  Signed,  [facility] Pager Number [phone]

## 2020-07-24 NOTE — Progress Notes (Signed)
Nutrition Follow-up  DOCUMENTATION CODES:   Non-severe (moderate) malnutrition in context of chronic illness  INTERVENTION:  After LP today resume Vital AF 1.2 Cal at 30 mL/hr and advance by 10 mL/hr ever 8 hours to goal rate of 60 mL/hr (1440 mL goal daily volume). Goal regimen provides 1728 kcal, 108 grams of protein, 1166 mL H2O daily.  Monitor magnesium, potassium, and phosphorus daily for at least 3 days, MD to replete as needed, as pt is at risk for refeeding syndrome.  Change liquid MVI to tablet form of MVI daily per tube as the liquid version is not a complete MVI.  Provide free water flush of 20 mL Q4hrs to maintain tube patency.  Plan is to order serum copper level today as patient is at risk for deficiency.  NUTRITION DIAGNOSIS:   Moderate Malnutrition related to chronic illness (cholecystitis s/p LOA, transgastric ERCP and biliary sphincterotomy, cholecystectomy, partial gastrectomy, and colotomy 5/27, hx Roux-en-Y gastric bypass, CHF) as evidenced by mild fat depletion, mild muscle depletion, moderate muscle depletion, 9.8% weight loss from 03/2020 to 06/2020.  New nutrition diagnosis.  GOAL:   Patient will meet greater than or equal to 90% of their needs  Progressing with resumption of tube feeds today.  MONITOR:   PO intake, Labs, Weight trends, TF tolerance, I & O's  REASON FOR ASSESSMENT:   Consult Enteral/tube feeding initiation and management  ASSESSMENT:   79 y/o female with h/o CHF, HLD, HTN, OSA, DM, Roux-en-y 2010, Lap band 2008, cholecystitis s/p (diagnostic laparoscopy, robotic lysis of adhesions, transgastric ERCP and biliary sphincterotomy, cholecystectomy, partial gastrectomy and colotomy 5/27) and colon resection for diverticulitis who is admitted with UTI, SIRS, shingles and encephalitis   9/10 per IR no indication for drain placement for stable pancreatic pseudocyst 9/11 NGT placed but plan was to try to adv and wait until AM for repeat  x-ray 9/12 TFs started at 20 mL/hr but tube later dislodged; tube was advanced, repeat x-ray taken and TFs resumed at 20 mL/hr 9/12 feeding tube became clogged 9/13 s/p placement of 10 Fr. Dobbhoff tube under fluoroscopic guidance that terminates in proximal portion of gastric remnant (could not be advanced further) 9/14 plan for LP  Met with patient and her daughter at bedside. Patient remains lethargic and unable to provide any history. Daughter reports patient had a good appetite and intake PTA. She does report that after her surgery in May she did have decreased appetite/intake and lost weight but then her appetite improved and she was starting to eat better. Daughter reports patient did not take bariatric vitamins/minerals. Per review of chart patient was 88 kg on 04/01/2020 and 79.4 kg on 07/08/2020. That is a weight loss of 8.6 kg (9.8% body weight) over 3 months, which is significant for time frame. Patient likely had acute malnutrition following her surgery in May that has now progressed to moderate chronic malnutrition.  Medications reviewed and include: Colace 100 mg BID, ferrous fumarate 1 tablet BID, Novolog 0-9 units Q4hrs, lactulose 20 grams BID, liquid MVI daily per tube, Protonix, Flomax, thiamine 100 mg daily IV, acyclovir, ampicillin, ceftriaxone.  Labs reviewed: CBG 147-168, Sodium 125, Chloride 93.  Enteral Access: 10 Fr. DHT placed in IR on 9/13  Discussed with MD via secure chat. Okay to order copper level. Plan is to minimize free water flush to 20 mL Q4hrs.  Discussed with RN. Plan is to resume tube feeds after LP today.  NUTRITION - FOCUSED PHYSICAL EXAM:    Most Recent  Value  Orbital Region Mild depletion  Upper Arm Region Mild depletion  Thoracic and Lumbar Region No depletion  Buccal Region Mild depletion  Temple Region Moderate depletion  Clavicle Bone Region Mild depletion  Clavicle and Acromion Bone Region Mild depletion  Scapular Bone Region Unable to assess   Dorsal Hand Unable to assess  Patellar Region Mild depletion  Anterior Thigh Region Mild depletion  Posterior Calf Region Moderate depletion  Edema (RD Assessment) None  Hair Reviewed  Eyes Unable to assess  Mouth Unable to assess  Skin Reviewed  Nails Reviewed     Diet Order:   Diet Order            Diet NPO time specified  Diet effective midnight                EDUCATION NEEDS:   No education needs have been identified at this time  Skin:  Skin Assessment: Reviewed RN Assessment (ecchymosis)  Last BM:  9/10-type 7 s/p lacutlose enema  Height:   Ht Readings from Last 1 Encounters:  07/12/20 '5\' 4"'  (1.626 m)   Weight:   Wt Readings from Last 1 Encounters:  07/24/20 75.3 kg   Ideal Body Weight:  54.5 kg  BMI:  Body mass index is 28.49 kg/m.  Estimated Nutritional Needs:   Kcal:  1700-1900kcal/day  Protein:  90-105 grams  Fluid:  1.6L/day  Jacklynn Barnacle, MS, RD, LDN Pager number available on Amion

## 2020-07-25 DIAGNOSIS — D530 Protein deficiency anemia: Secondary | ICD-10-CM

## 2020-07-25 DIAGNOSIS — B029 Zoster without complications: Secondary | ICD-10-CM | POA: Diagnosis not present

## 2020-07-25 DIAGNOSIS — R509 Fever, unspecified: Secondary | ICD-10-CM | POA: Diagnosis not present

## 2020-07-25 DIAGNOSIS — R519 Headache, unspecified: Secondary | ICD-10-CM | POA: Diagnosis not present

## 2020-07-25 DIAGNOSIS — R52 Pain, unspecified: Secondary | ICD-10-CM | POA: Diagnosis not present

## 2020-07-25 LAB — GLUCOSE, CAPILLARY
Glucose-Capillary: 111 mg/dL — ABNORMAL HIGH (ref 70–99)
Glucose-Capillary: 140 mg/dL — ABNORMAL HIGH (ref 70–99)
Glucose-Capillary: 148 mg/dL — ABNORMAL HIGH (ref 70–99)
Glucose-Capillary: 149 mg/dL — ABNORMAL HIGH (ref 70–99)
Glucose-Capillary: 164 mg/dL — ABNORMAL HIGH (ref 70–99)

## 2020-07-25 LAB — CBC WITH DIFFERENTIAL/PLATELET
Abs Immature Granulocytes: 0.03 10*3/uL (ref 0.00–0.07)
Basophils Absolute: 0 10*3/uL (ref 0.0–0.1)
Basophils Relative: 0 %
Eosinophils Absolute: 0.2 10*3/uL (ref 0.0–0.5)
Eosinophils Relative: 2 %
HCT: 29.8 % — ABNORMAL LOW (ref 36.0–46.0)
Hemoglobin: 10.3 g/dL — ABNORMAL LOW (ref 12.0–15.0)
Immature Granulocytes: 0 %
Lymphocytes Relative: 11 %
Lymphs Abs: 0.8 10*3/uL (ref 0.7–4.0)
MCH: 33 pg (ref 26.0–34.0)
MCHC: 34.6 g/dL (ref 30.0–36.0)
MCV: 95.5 fL (ref 80.0–100.0)
Monocytes Absolute: 0.8 10*3/uL (ref 0.1–1.0)
Monocytes Relative: 10 %
Neutro Abs: 5.7 10*3/uL (ref 1.7–7.7)
Neutrophils Relative %: 77 %
Platelets: 284 10*3/uL (ref 150–400)
RBC: 3.12 MIL/uL — ABNORMAL LOW (ref 3.87–5.11)
RDW: 13.2 % (ref 11.5–15.5)
WBC: 7.5 10*3/uL (ref 4.0–10.5)
nRBC: 0 % (ref 0.0–0.2)

## 2020-07-25 LAB — BASIC METABOLIC PANEL
Anion gap: 10 (ref 5–15)
BUN: 11 mg/dL (ref 8–23)
CO2: 26 mmol/L (ref 22–32)
Calcium: 8.8 mg/dL — ABNORMAL LOW (ref 8.9–10.3)
Chloride: 94 mmol/L — ABNORMAL LOW (ref 98–111)
Creatinine, Ser: 0.61 mg/dL (ref 0.44–1.00)
GFR calc Af Amer: >60 mL/min (ref >60)
GFR calc non Af Amer: >60 mL/min (ref >60)
Glucose, Bld: 126 mg/dL — ABNORMAL HIGH (ref 70–99)
Potassium: 3.8 mmol/L (ref 3.5–5.1)
Sodium: 130 mmol/L — ABNORMAL LOW (ref 135–145)

## 2020-07-25 LAB — VDRL, CSF: VDRL Quant, CSF: NONREACTIVE

## 2020-07-25 LAB — MISC LABCORP TEST (SEND OUT)
Labcorp test code: 138966
Labcorp test code: 138842

## 2020-07-25 LAB — PHOSPHORUS: Phosphorus: 3.9 mg/dL (ref 2.5–4.6)

## 2020-07-25 LAB — MAGNESIUM: Magnesium: 1.9 mg/dL (ref 1.7–2.4)

## 2020-07-25 LAB — PROCALCITONIN: Procalcitonin: <0.1 ng/mL

## 2020-07-25 MED ORDER — LEVETIRACETAM IN NACL 500 MG/100ML IV SOLN
500.0000 mg | Freq: Two times a day (BID) | INTRAVENOUS | Status: DC
  Administered 2020-07-25: 500 mg via INTRAVENOUS
  Filled 2020-07-25 (×3): qty 100

## 2020-07-25 NOTE — Progress Notes (Signed)
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 Rash is intensely pruritic different stages of healing some areas of vesicular be red border. Some areas covered in black eschars. Skin is diffusely tender.Per family (asked by me) pt developed confusion. Was c/o side to back pain.  Sed rate was elevated. LP was considered but patient declined stating that she had a history of meningitis in the past and very scared of having LP done. Hospitalist was called for admission for shingles and possible encephalitis.  9/8.IV acyclovir was changed to Valtrex. Patient has not had a bowel movement since admission, has poor appetite. Giving lactulose, increase the Protonix to twice a day.  9/9.Patient refused CPAP last night, she became more confused today. She also has reversed sleeping cycle. Added Seroquel 25 mg evening. Schedule lactulose twice a day for constipation.  9/10.Patient slept last night with Seroquel. Still very confused and fatigued.Seen by neurologist, suspect metabolic source. Repeated CT scan of the abdomen/pelvis, intra-abdominal fluid collection still present.  9/13.Dobbhoff placed, tube feeding started. Also consult psychiatry for 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  9/14.  Patient mental status not improving.  Had a fever of 102 yesterday evening.  ID restart antibiotics with Rocephin and ampicillin.  LP is performed today LP is performed.  Study consistent with viral  meningitis/encephalitis.  Discussed with infect disease and neurology, neurology concerned for subacute seizure.  MRI of the brain and MRA will be performed.  EEG is also ordered.  However, patient will need a persistent EEG monitoring, initiate as process to transfer patient to a tertiary hospital.  I have started the process with [facility] per family request.  [facility]  They are aware that the transfer process may take several days.  Also started seizure medicine per neurology. [facility], will arrange for transfer.   9/15: EEG: moderate diffuse encephlopahty, nonspecific. No seizure    Consultants:   Neurology, ID, interventional radiology  Procedures: Status post LP on 9/14  Antimicrobials:  Ampicillin and Rocephin. Acyclovir 9/11    Subjective: Pt sleeping, not responding to questions or participating in exam. Hands in mittens.   Objective: Vitals:   07/25/20 0919 07/25/20 0924 07/25/20 1157 07/25/20 1334  BP: (!) 141/68 (!) 141/68 116/66 139/68  Pulse: (!) 101 (!) 101 91 83  Resp:   (!) 22 18  Temp:  (!) 100.8 F (38.2 C) 99 F (37.2 C) 98.6 F (37 C)  TempSrc:  Oral Oral Oral  SpO2:   100% 100%  Weight:      Height:        Intake/Output Summary (Last 24 hours) at 07/25/2020 1405 Last data filed at 07/25/2020 0558 Gross per 24 hour  Intake --  Output 2325 ml  Net -2325 ml   Filed Weights   07/23/20 0500 07/24/20 0500 07/25/20 0500  Weight: 73 kg 75.3 kg 76 kg    Examination:  General exam: Appears calm and comfortable  Respiratory system: Clear to auscultation.  Respiratory effort normal. Cardiovascular system: S1 & S2 heard, RRR. No JVD, murmurs, rubs, gallops or clicks. No pedal edema. Gastrointestinal system: Abdomen is nondistended, soft and nontender. No organomegaly or masses felt. Normal bowel sounds heard. Central nervous system: Alert and oriented. No focal  neurological deficits. Extremities: Symmetric 5 x 5 power. Skin: No rashes, lesions or ulcers Psychiatry: Judgement and insight appear normal. Mood & affect appropriate.     Data Reviewed: I have personally reviewed following labs and imaging studies  CBC: Recent Labs  Lab 07/20/20 0448 07/22/20 0439 07/23/20 1939 07/24/20 0705 07/25/20 0407  WBC 6.2 7.1 9.8 8.6 7.5  NEUTROABS 4.1 5.3 7.4 6.8 5.7  HGB 10.8* 10.1* 11.1* 10.5* 10.3*  HCT 32.3* 29.6* 31.2* 30.3* 29.8*  MCV 96.4 96.4 91.2 94.7 95.5  PLT 271 290 311 266 284   Basic Metabolic Panel: Recent Labs  Lab 07/19/20 0533 07/19/20 0533 07/20/20 0448 07/20/20 0448 07/21/20 0434 07/21/20 0434 07/22/20 0439 07/22/20 0439 07/23/20 1939 07/24/20 0705 07/24/20 0941 07/24/20 1515 07/25/20 0407  NA 131*   < > 130*   < > 131*   < > 134*   < > 127* 125* 126* 127* 130*  K 3.4*   < > 3.7   < > 4.0  --  3.8  --  3.6 3.9  --   --  3.8  CL 96*   < > 96*   < > 99  --  103  --  92* 93*  --   --  94*  CO2 23   < > 25   < > 23  --  24  --  24 23  --   --  26  GLUCOSE 130*   < > 134*   < > 160*  --  145*  --  143* 175*  --   --  126*  BUN 11   < > 9   < > 12  --  12  --  12 12  --   --  11  CREATININE 0.76   < > 0.81   < > 0.96  --  0.79  --  0.62 0.76  --   --  0.61  CALCIUM 8.8*   < > 8.8*   < > 9.3  --  8.9  --  9.2 8.3*  --   --  8.8*  MG 2.1   < > 1.9  --  2.0  --  1.9  --   --  1.7  --   --  1.9  PHOS 3.3  --   --   --  4.7*  --   --   --   --  3.4  --   --  3.9   < > = values in this interval not displayed.   GFR: Estimated Creatinine Clearance: 56 mL/min (by C-G formula based on SCr of 0.61 mg/dL). Liver Function Tests: Recent Labs  Lab 07/23/20 1939  AST 27  ALT 29  ALKPHOS 71  BILITOT 0.8  PROT 7.0  ALBUMIN 3.3*   No results for input(s): LIPASE, AMYLASE in the last 168 hours. No results for input(s): AMMONIA in the last 168 hours. Coagulation Profile: Recent Labs  Lab 07/24/20 0822  INR 1.1   Cardiac  Enzymes: No results for input(s): CKTOTAL, CKMB, CKMBINDEX, TROPONINI in the last 168 hours. BNP (last 3 results) No results for input(s): PROBNP in the last 8760 hours. HbA1C: No results for input(s): HGBA1C in the  last 72 hours. CBG: Recent Labs  Lab 07/24/20 2040 07/25/20 0001 07/25/20 0351 07/25/20 0757 07/25/20 1159  GLUCAP 160* 164* 111* 149* 148*   Lipid Profile: No results for input(s): CHOL, HDL, LDLCALC, TRIG, CHOLHDL, LDLDIRECT in the last 72 hours. Thyroid Function Tests: No results for input(s): TSH, T4TOTAL, FREET4, T3FREE, THYROIDAB in the last 72 hours. Anemia Panel: No results for input(s): VITAMINB12, FOLATE, FERRITIN, TIBC, IRON, RETICCTPCT in the last 72 hours. Sepsis Labs: Recent Labs  Lab 07/20/20 0448 07/22/20 0439 07/24/20 0705 07/25/20 0407  PROCALCITON <0.10 <0.10 <0.10 <0.10    Recent Results (from the past 240 hour(s))  CULTURE, BLOOD (ROUTINE X 2) w Reflex to ID Panel     Status: None   Collection Time: 07/16/20  5:36 PM   Specimen: BLOOD  Result Value Ref Range Status   Specimen Description BLOOD [facility]  Final   Special Requests   Final    BOTTLES DRAWN AEROBIC ONLY Blood Culture adequate volume   Culture   Final    NO GROWTH 5 DAYS Performed at [facility], [address] [place], [place] [address]    Report Status 07/21/2020 FINAL  Final  CULTURE, BLOOD (ROUTINE X 2) w Reflex to ID Panel     Status: None   Collection Time: 07/16/20  5:36 PM   Specimen: BLOOD  Result Value Ref Range Status   Specimen Description BLOOD [facility]  Final   Special Requests   Final    BOTTLES DRAWN AEROBIC AND ANAEROBIC Blood Culture adequate volume   Culture   Final    NO GROWTH 5 DAYS Performed at [facility], [address], [place], [place] [address]    Report Status 07/21/2020 FINAL  Final  CULTURE, BLOOD (ROUTINE X 2) w Reflex to ID Panel     Status: None (Preliminary result)   Collection Time: 07/23/20  7:46 PM   Specimen:  BLOOD  Result Value Ref Range Status   Specimen Description BLOOD LEFT [facility]  Final   Special Requests   Final    BOTTLES DRAWN AEROBIC AND ANAEROBIC Blood Culture adequate volume   Culture   Final    NO GROWTH 2 DAYS Performed at [facility], [address] [place], [place] [address]    Report Status PENDING  Incomplete  CULTURE, BLOOD (ROUTINE X 2) w Reflex to ID Panel     Status: None (Preliminary result)   Collection Time: 07/23/20  7:47 PM   Specimen: BLOOD  Result Value Ref Range Status   Specimen Description BLOOD RIGHT [facility]  Final   Special Requests   Final    BOTTLES DRAWN AEROBIC AND ANAEROBIC Blood Culture adequate volume   Culture   Final    NO GROWTH 2 DAYS Performed at [facility], [address], [place], [place] [address]    Report Status PENDING  Incomplete  Culture, fungus without smear     Status: None (Preliminary result)   Collection Time: 07/24/20  1:15 PM   Specimen: CSF; Cerebrospinal Fluid  Result Value Ref Range Status   Specimen Description   Final    CSF Performed at [facility], [address], [place], [place] [address]    Special Requests   Final    Normal Performed at [facility], [address], [place], [place] [address]    Culture   Final    NO FUNGUS ISOLATED AFTER 1 DAY Performed at [facility], [address] [place], [place] [address]    Report Status PENDING  Incomplete  CSF culture     Status: None (Preliminary result)   Collection Time: 07/24/20  1:15 PM   Specimen: PATH Cytology CSF; Cerebrospinal Fluid  Result Value Ref Range Status   Specimen Description   Final    CSF Performed at [facility], [address] [place], [place] [address]    Special Requests   Final    NONE Performed at [facility], [address], [place], [place] [address]    Gram Stain   Final    WBC SEEN NO ORGANISMS SEEN Performed at [facility], [address]  [place], [place] [address]    Culture   Final    NO GROWTH < 24 HOURS Performed at [facility], [address], [place], [place] [address]    Report Status PENDING  Incomplete         Radiology Studies: MR ANGIO HEAD WO CONTRAST  Result Date: 07/24/2020 CLINICAL DATA:  79 year old female with altered mental status, abnormal CSF on lumbar puncture today. Lymphocytic pleocytosis. Meningitis suspected. Query encephalitis or vasculitis. EXAM: MRA HEAD WITHOUT CONTRAST TECHNIQUE: Angiographic images of the Circle of Willis were obtained using MRA technique without intravenous contrast. COMPARISON:  Brain MRI today reported separately. FINDINGS: Study is mildly degraded by motion artifact despite repeated imaging attempts. Antegrade flow in the posterior circulation. Tortuous distal vertebral arteries and basilar, without significant stenosis. Patent basilar tip, SCA and PCA origins. Left posterior communicating artery is present, the right is diminutive or absent. Bilateral PCA branches are within normal limits. Antegrade flow in both ICA siphons. Mildly tortuous cavernous ICAs. Ophthalmic and left posterior communicating artery origins are within normal limits. Patent carotid termini, MCA and ACA origins. Tortuous A1 segments. Anterior communicating artery appears normal. ACA branch detail limited by motion. MCA M1 segments are tortuous. MCA bifurcations are patent without stenosis. Grossly normal visible MCA branches, detail degraded by motion. IMPRESSION: 1. Intracranial MRA degraded by motion, such that the circle-of-Willis branch detail is degraded in the anterior circulation. There is no evidence of a large vessel vasculitis. 2. No large vessel occlusion or significant arterial stenosis identified. There is generalized arterial tortuosity. Electronically Signed   By: Odessa Fleming M.D.   On: 07/24/2020 20:57   MR BRAIN W WO CONTRAST  Result Date: 07/24/2020 CLINICAL DATA:  79 year old female with  altered mental status, abnormal CSF on lumbar puncture today. Lymphocytic pleocytosis. Meningitis suspected. Query encephalitis. EXAM: MRI HEAD WITHOUT AND WITH CONTRAST TECHNIQUE: Multiplanar, multiecho pulse sequences of the brain and surrounding structures were obtained without and with intravenous contrast. CONTRAST:  7mL GADAVIST GADOBUTROL 1 MMOL/ML IV SOLN COMPARISON:  Brain MRI 07/13/2020. FINDINGS: Brain: Stable cerebral volume and morphology from 07/13/2020. Mild ex vacuo appearing ventricular enlargement, temporal horns are decompressed. No intraventricular debris. No ependymal enhancement. No restricted diffusion or evidence of acute infarction. No cerebral edema identified. Temporal lobes and insula appears stable and within normal limits. Mild for age and nonspecific mostly periventricular white matter T2 and FLAIR hyperintensity appears stable. No cortical encephalomalacia or chronic cerebral blood products. Deep gray nuclei, brainstem and cerebellum appear negative. No abnormal enhancement identified.  No dural thickening. No midline shift, mass effect, evidence of mass lesion, extra-axial collection or acute intracranial hemorrhage. Cervicomedullary junction and pituitary are within normal limits. Vascular: Major intracranial vascular flow voids are stable, with mild generalized intracranial artery tortuosity. The major dural venous sinuses  seem to be enhancing and patent. Skull and upper cervical spine: Negative visible cervical spine. Bone marrow signal is stable and within normal limits. Sinuses/Orbits: Stable, negative orbits. Mild paranasal sinus mucosal thickening in the ethmoids and sphenoids. There is a right nasoenteric tube in place. No sinus fluid levels. Other: Mastoids remain well pneumatized. Scalp and face appear negative. IMPRESSION: 1. Stable MRI appearance of the brain since 07/13/2020, with no acute or inflammatory process identified. 2. Mild for age nonspecific white matter  signal changes, most commonly due to chronic small vessel disease. 3. Minor paranasal sinus mucosal thickening with right nasoenteric tube in place. Electronically Signed   By: Odessa Fleming M.D.   On: 07/24/2020 20:53   DG FLUORO GUIDED NEEDLE PLC ASPIRATION/INJECTION LOC  Result Date: 07/24/2020 CLINICAL DATA:  79 year old female with history of altered mental status. EXAM: DIAGNOSTIC LUMBAR PUNCTURE UNDER FLUOROSCOPIC GUIDANCE FLUOROSCOPY TIME:  Fluoroscopy Time:  42 seconds Radiation Exposure Index (if provided by the fluoroscopic device): 4 mGy PROCEDURE: Informed consent was obtained from the patient prior to the procedure, including potential complications of headache, allergy, and pain. With the patient prone, the lower back was prepped with Betadine. 2% Chloroprocaine was used for local anesthesia. Lumbar puncture was performed at the L2-L3 level using a 20 gauge needle with return of slightly yellow tinged CSF with an opening pressure of 12 cm water. 10.5 ml of CSF were obtained for laboratory studies. The patient tolerated the procedure well and there were no apparent complications. IMPRESSION: 1. Successful uncomplicated fluoroscopic guided lumbar puncture, as above. Electronically Signed   By: Trudie Reed M.D.   On: 07/24/2020 14:08        Scheduled Meds: . Chlorhexidine Gluconate Cloth  6 each Topical Daily  . docusate sodium  100 mg Oral BID  . Ferrous Fumarate  1 tablet Oral BID  . free water  20 mL Per Tube Q4H  . insulin aspart  0-9 Units Subcutaneous Q4H  . lactulose  20 g Oral BID  . metoprolol tartrate  12.5 mg Oral BID  . multivitamin with minerals  1 tablet Per Tube Daily  . pantoprazole (PROTONIX) IV  40 mg Intravenous Q12H  . rosuvastatin  20 mg Oral Daily  . sodium chloride flush  3 mL Intravenous Q12H  . tamsulosin  0.4 mg Oral QPC supper  . thiamine injection  100 mg Intravenous Daily  . traZODone  25 mg Oral QHS   Continuous Infusions: . sodium chloride 10  mL/hr at 07/22/20 1610  . acyclovir 760 mg (07/25/20 0455)  . feeding supplement (VITAL AF 1.2 CAL) 1,000 mL (07/24/20 1833)  . levETIRAcetam      Assessment & Plan:   Active Problems:   SIRS (systemic inflammatory response syndrome) (HCC)   Shingles rash   Delirium   Acute metabolic encephalopathy   Essential hypertension   Anemia   Acute lower UTI   Chronic diastolic CHF (congestive heart failure) (HCC)   Sepsis (HCC)   Change in mental status   Anorexia   Malnutrition of moderate degree   #1.  Acute metabolic cephalopathy. Less likely bacterial meningitis. S/p LP 9/14:lymphocytic pleocytosis with protein of 222. MRA/MRI no acute changes.  ceftrizone and ampicillin d/c;d Neurology following: EEG: no seizure  on keppra  bid, if EEG obtained and negative discontinue keppra.  CSF studies pending.  #2.  Shingles with aseptic meningitis. Continue iv acyclovir ID following   #3.  Anorexia with poor p.o. intake. Continue TF  4.  Hyponatremia. Likely due to SIADH, patient does not have volume overload, does not seem to have dehydration.  TF free water decreased.  Na improving. Na 127    5.  Sepsis secondary to UTI and viral infection. Condition improved.  6.  Intra-abdominal fluid collection. CT with fluid collection. Likely pseudocyst., seroma, or biloma, not abscess. IR does not recommend aspiration  7.  Urinary retention. Continue foley  8.  Urinary tract infection. Resolved.received ceftriaxone.  9.  Chronic diastolic congestive heart failure. No volume overload   DVT prophylaxis: scd Code Status:full Family Communication: daughter updated  Status is: Inpatient  Remains inpatient appropriate because: inpatient level of care appropriate due to severity of illness  Dispo: The patient is from: Home              Anticipated d/c is to: SNF              Anticipated d/c date is: >3 days              Patient currently is not medically  stable to dc            LOS: 12 days   Time spent: 35% with more than 50% on COC    Lynn Ito, MD [facility] Pager [phone]  If 7PM-7AM, please contact night-coverage [email or web address] Password [facility] 07/25/2020, 2:05 PM

## 2020-07-25 NOTE — Procedures (Signed)
Patient Name: Casey Baldwin  MRN: [id number]  Epilepsy Attending: Charlsie Quest  Referring Physician/Provider: Dr Erick Blinks Date: 07/25/2020 Duration: 21.01 mins  Patient history: 79yo F with ams, facial twitching. EEG to evaluate for seizure  Level of alertness: Awake  AEDs during EEG study: Keppra  Technical aspects: This EEG study was done with scalp electrodes positioned according to the 10-20 International system of electrode placement. Electrical activity was acquired at a sampling rate of 500Hz  and reviewed with a high frequency filter of 70Hz  and a low frequency filter of 1Hz . EEG data were recorded continuously and digitally stored.   Description: No posterior dominant rhythm was seen. EEG showed continuous generalized 3 to 6 Hz theta-delta slowing. Photic driving was not seen during photic stimulation. Hyperventilation was not performed.     ABNORMALITY -Continuous slow, generalized  IMPRESSION: This study is suggestive of moderate diffuse encephalopathy, nonspecific etiology. No seizures or epileptiform discharges were seen throughout the recording.  Humberto Addo 

## 2020-07-25 NOTE — Progress Notes (Signed)
ID  Patient Vitals for the past 24 hrs:  BP Temp Temp src Pulse Resp SpO2 Weight  07/25/20 0924 (!) 141/68 (!) 100.8 F (38.2 C) Oral (!) 101 -- -- --  07/25/20 0919 (!) 141/68 -- -- (!) 101 -- -- --  07/25/20 0755 (!) 146/71 100 F (37.8 C) Oral (!) 101 18 100 % --  07/25/20 0500 -- -- -- -- -- -- 76 kg  07/24/20 2339 (!) 118/58 98 F (36.7 C) Oral (!) 59 16 100 % --  07/24/20 1537 (!) 151/67 98.1 F (36.7 C) Oral 79 20 100 % --  07/24/20 1010 (!) 151/76 (!) 100.5 F (38.1 C) Oral 86 20 100 % --    Pt lethargic said she was fine when I asked her But after that non verbal Chest b/l air entry [id number] Abd soft Foley  Impression/recommendation Presented with Fever, headache, painful rash on the rt side of the back and chest with radiating pain from back to front, confusion   Herpes zoster-T5 Rt side  on IV acyclovir  fever low grade No repsonse to antibiotics like Doxy ( 5days) ceftriaxone ( 5 days)   Encephalopathy-On admission she was alert and verbal-and she did not have features of encephalitis or bacterial meningitis clinically on 07/12/20-MRI of the brain was negative  slow progression to obtunded state-- more so since 07/18/20 D.D r/o viral encephalitis -- zoster VS other virus  R/o bacterial meningitis ( less likely) acyclovir induced neurotoxicity ( but has normal renal function) Subclinical seizures R/o NMDAR due to zoster LP was done on 07/24/20 and it was a lymphocytic pleocytosis, high protein and glucose was elevated   MRI brain and MRA did not reveal any acute abnormality  She also has change in diurnal rhythm with sleeping day time and awake at night Also OSA but not wearing CPAP- but no co2 narcosis as per ABG Poor intake Urinary retention Constipation All contributing to the above  She has a collection thought to be psedocyst VS seroma VS bilioma secondary to arecent complicated stay at [facility] between 04/01/20 until 05/14/20 for abdominal pain which  was diagnosed as choledocholithiasis/cholangitis in a setting of prior RYGB in 2008. 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 and she was taken to [facility] 04/05/20 for diagnostic lap, robotic lysis of adhesions, transgastric ERCP and biliary sphincterotomy, cholecystectomy, partial gastrectomy andaccidentalcolotomy which was repaired.The immediate post op was complicated by post ERCP pancreatitis , pulmonary edema with acute hypoxic resp failure , Afib ( treated with amio and metoprolol), fever and leucocytosis suspected due to pancreatitis and was treated with zosyn, UTI due to proteus and acute on chronic anemia needing PRBC and AKI.She was seen by gerontologist for insomnia and risk for delirium  Rt sided abdominal firmness and fullness CT abdomen showsFluid collection at lies along the posterior margin of the pancreas, predominantly collecting along the right anterior pararenal fascia. This may reflect an abscess. It could be a pseudocyst if there is a history of pancreatitis. It may be a postoperative collection, since the patient underwent a cholecystectomy since the prior CT. Collection measures approximately 14 x 4 x 10 cm in size. Because of ongoing delirium need to r/o infection Discussed with neurologist and then with IR Dr.Henn. As repeat CT shows hte collection which is thought o be either a pseudocyst, seroma or biloma and not abscess IR does not recommend aspiration.  As she  has temp may need aspiration if LP does not give the answer  Urine culture multiple species-received 5 days of ceftriaxone  Hearing loss   Left TKA  Discussed the management with her daughter, and her nurse

## 2020-07-25 NOTE — Progress Notes (Signed)
eeg done °

## 2020-07-25 NOTE — Progress Notes (Signed)
Physical Therapy Treatment Patient Details Name: Casey Baldwin MRN: [id number] DOB: 1941/04/02 Today's Date: 07/25/2020    History of Present Illness Casey Baldwin is a 79 y.o. 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  Presented to ER secondary to generalized body aches, progressive weakness, headaches; admitted for management of SIRS (urine?), shingles and acute metabolic encephalopathy.    PT Comments    Pt received in Semi-Fowler's position upon arrival to room.  Pt very lethargic and difficult to hear when speaking.  Pt does utilize head movements for yes/no questioning.  Pt asked if she would like to have her hearing aids placed in her ears, and she responded with no.  Pt is able to communicate very sporadically and listens to verbal commands.  Pt able to perform exercises much better on RLE than LLE, however still requires AAROM to complete as noted below.  PT attempted to help pt with transfer to sitting EOB, however pt responded by shaking her head as though she did not want to perform transfer, so it was held off for future session.  Pt required extensive time to perform exercises after verbal commands.  Current discharge plans to SNF remain appropriate at this time.  Pt will continue to benefit from skilled therapy in order to address deficits listed below.     Follow Up Recommendations  SNF     Equipment Recommendations       Recommendations for Other Services       Precautions / Restrictions Precautions Precautions: Fall Restrictions Other Position/Activity Restrictions: NG tube    Mobility  Bed Mobility               General bed mobility comments: attempted to manuever in bed, but pt unable to assist secondary to lethargy.  Transfers                    Ambulation/Gait                 Stairs             Wheelchair Mobility    Modified Rankin  (Stroke Patients Only)       Balance                                            Cognition Arousal/Alertness: Lethargic Behavior During Therapy: WFL for tasks assessed/performed Overall Cognitive Status: Impaired/Different from baseline Area of Impairment: Attention;Memory;Following commands;Safety/judgement;Awareness;Problem solving                 Orientation Level: Person Current Attention Level: Focused Memory: Decreased recall of precautions;Decreased short-term memory   Safety/Judgement: Decreased awareness of safety;Decreased awareness of deficits Awareness: Intellectual Problem Solving: Slow processing;Decreased initiation;Difficulty sequencing;Requires verbal cues;Requires tactile cues General Comments: Pt able to follow commands sparingly and is globally confused. She appears lethargic and does verbalize a few words but difficult to understand. Pt is unable to communicate needs at this time.      Exercises General Exercises - Lower Extremity Ankle Circles/Pumps: AROM;Strengthening;Both;10 reps;Supine Quad Sets: AROM;Right;AAROM;Left;10 reps;Supine Gluteal Sets: AROM;Strengthening;10 reps;Supine Heel Slides: AAROM;Both;10 reps;Supine Hip ABduction/ADduction: AROM;Right;AAROM;Left;Both;10 reps;Supine Straight Leg Raises: AAROM;Both;10 reps;Supine    General Comments        Pertinent Vitals/Pain Pain Assessment: Faces Pain Location: generalized Pain Descriptors / Indicators: Grimacing;Guarding Pain Intervention(s): Limited  activity within patient's tolerance;Monitored during session;Repositioned    Home Living                      Prior Function            PT Goals (current goals can now be found in the care plan section) Acute Rehab PT Goals Patient Stated Goal: To return home  PT Goal Formulation: With patient Time For Goal Achievement: 07/27/20 Potential to Achieve Goals: Fair Progress towards PT goals: Not progressing  toward goals - comment (Pt able to respond sporadically to shortened commands and with use of tactile cuing, but requires extensive time in order to perform and requires AAROM to perform.)    Frequency    Min 2X/week      PT Plan Current plan remains appropriate    Co-evaluation              AM-PAC PT "6 Clicks" Mobility   Outcome Measure  Help needed turning from your back to your side while in a flat bed without using bedrails?: Total Help needed moving from lying on your back to sitting on the side of a flat bed without using bedrails?: Total Help needed moving to and from a bed to a chair (including a wheelchair)?: Total Help needed standing up from a chair using your arms (e.g., wheelchair or bedside chair)?: Total Help needed to walk in hospital room?: Total Help needed climbing 3-5 steps with a railing? : Total 6 Click Score: 6    End of Session   Activity Tolerance: Patient limited by lethargy Patient left: in bed;with bed alarm set;with call bell/phone within reach   PT Visit Diagnosis: Muscle weakness (generalized) (M62.81);Difficulty in walking, not elsewhere classified (R26.2)     Time: 9629-5284 PT Time Calculation (min) (ACUTE ONLY): 26 min  Charges:  $Therapeutic Activity: 23-37 mins                     Nolon Bussing, PT, DPT 07/25/20, 5:05 PM

## 2020-07-25 NOTE — Progress Notes (Signed)
NEUROLOGY CONSULTATION PROGRESS NOTE   Date of service: July 25, 2020 Patient Name: Casey Baldwin MRN:  [id number] DOB:  November 07, 1941  Interval Hx:   Spiked a fever overnight with Tmax of 100.8.   Saw her in AM when she was snoring. Some concern expressed by RN for twitching at mouth with tongue out but it was a one time event. During evaluation later in the day, she opened her eyes to voice and did follow 1 step commands after a lot of encouragement in all extremities. She did not answer any questions.  MRI Brain with and without contrast and MRA head with no Flair hyperintensity, no enhancement. Motion degraded MRA with no findings concerning for large vessel vasculitis.   Past History   Past Medical History:  Diagnosis Date  . Coronary artery disease   . High cholesterol   . Hypertension    Past Surgical History:  Procedure Laterality Date  . ABDOMINAL HYSTERECTOMY    . COLON RESECTION    . FACIAL COSMETIC SURGERY    . FRACTURE SURGERY     left leg and left ankle.  Marland Kitchen GASTRIC BYPASS    . left knee replacement     Family History  Problem Relation Age of Onset  . Colon cancer Mother    Social History   Socioeconomic History  . Marital status: Widowed    Spouse name: Not on file  . Number of children: Not on file  . Years of education: Not on file  . Highest education level: Not on file  Occupational History  . Not on file  Tobacco Use  . Smoking status: Never Smoker  . Smokeless tobacco: Never Used  Vaping Use  . Vaping Use: Never used  Substance and Sexual Activity  . Alcohol use: No  . Drug use: No  . Sexual activity: Not on file  Other Topics Concern  . Not on file  Social History Narrative  . Not on file   Social Determinants of Health   Financial Resource Strain:   . Difficulty of Paying Living Expenses: Not on file  Food Insecurity:   . Worried About Programme researcher, broadcasting/film/video in the Last Year: Not on file  . Ran Out of Food in the Last Year: Not on  file  Transportation Needs:   . Lack of Transportation (Medical): Not on file  . Lack of Transportation (Non-Medical): Not on file  Physical Activity:   . Days of Exercise per Week: Not on file  . Minutes of Exercise per Session: Not on file  Stress:   . Feeling of Stress : Not on file  Social Connections:   . Frequency of Communication with Friends and Family: Not on file  . Frequency of Social Gatherings with Friends and Family: Not on file  . Attends Religious Services: Not on file  . Active Member of Clubs or Organizations: Not on file  . Attends Banker Meetings: Not on file  . Marital Status: Not on file   Allergies  Allergen Reactions  . Lidocaine Anaphylaxis and Other (See Comments)    Became unconscious with administration for dental procedure at age 61yo. Pt tolerated subsequent lidocaine.    . Amlodipine Other (See Comments)    Hand and leg cramping  . Lactose Diarrhea  . Lactose Intolerance (Gi)   . Latex     Medications   Medications Prior to Admission  Medication Sig Dispense Refill Last Dose  . Acetaminophen 500 MG capsule Take 500  mg by mouth daily as needed.      . Artificial Tear Ointment (DRY EYES OP) Place 1-2 drops into both eyes daily as needed.     Marland Kitchen aspirin 81 MG EC tablet Take 81 mg by mouth daily.      . bisacodyl (DULCOLAX) 5 MG EC tablet Take 10 mg by mouth at bedtime.      . calcium carbonate (OS-CAL) 1250 (500 Ca) MG chewable tablet Chew 2 tablets by mouth daily.     . Cholecalciferol (VITAMIN D PO) Take 1 tablet by mouth daily.     . furosemide (LASIX) 20 MG tablet Take 20-40 mg by mouth daily. CAN TAKE 40MG  FOR SWELLING     . isosorbide mononitrate (IMDUR) 30 MG 24 hr tablet Take 30 mg by mouth daily.     . Multiple Vitamin (MULTIVITAMIN WITH MINERALS) TABS tablet Take 1 tablet by mouth daily.     . Multiple Vitamins-Minerals (ICAPS AREDS 2 PO) Take 1 capsule by mouth daily.     . ondansetron (ZOFRAN) 4 MG tablet Take 4 mg by  mouth daily as needed.     . pantoprazole (PROTONIX) 20 MG tablet Take 20 mg by mouth daily.     . rosuvastatin (CRESTOR) 20 MG tablet Take 20 mg by mouth daily.     senna-docusate (SENOKOT-S) 8.6-50 MG tablet Take 2 tablets by mouth 2 (two) times daily. (Patient not taking: Reported on 04/01/2020) 120 tablet 0   . witch hazel-glycerin (TUCKS) pad Apply 1 application topically as needed for itching. (Patient not taking: Reported on 07/10/2020) 40 each 12      Vitals  Temp:  [98 F (36.7 C)-100.8 F (38.2 C)] 98.6 F (37 C) (09/15 1334) Pulse Rate:  [59-101] 83 (09/15 1334) Resp:  [16-22] 18 (09/15 1334) BP: (116-151)/(58-71) 139/68 (09/15 1334) SpO2:  [100 %] 100 % (09/15 1334) Weight:  [76 kg] 76 kg (09/15 0500)  Body mass index is 28.76 kg/m.  Physical Exam    Neurologic Examination  Mental status/Cognition: Opens eyes to voice, briefly tracks my face, Does not answer any question, does lift R hand when encourgaed to give a high five, keeps left hand up in the air and does wiggles both toes on command.  Speech/language: no speech today. Cranial nerves:   CN II Pupils equal and reactive to light, blinks to threat and tracks face.   CN III,IV,VI No gaze preference, EOMI intact to dolls eyes   CN V    CN VII Symmetric facial grimace   CN VIII    CN IX & X    CN XI    CN XII    Motor:  Muscle bulk: normal, tone normal. Moves all extremities spontaneously and follows commands in all 4 with a lot of encouragement. Reflexes:  Right Left Comments  Pectoralis      Biceps (C5/6) 2 2   Brachioradialis (C5/6) 2 2    Triceps (C6/7) 2 2    Patellar (L3/4) 2 2    Achilles (S1) 1 1    Hoffman      Plantar     Jaw jerk     Sensation:  Light touch Regards touch in all extremities.   Pin prick    Temperature    Vibration   Proprioception    Coordination/Complex Motor:  Unable to assess.  Labs   Lab Results  Component Value Date   NA 130 (L)  07/25/2020   K 3.8 07/25/2020  CL 94 (L) 07/25/2020   CO2 26 07/25/2020   GLUCOSE 126 (H) 07/25/2020   BUN 11 07/25/2020   CREATININE 0.61 07/25/2020   CALCIUM 8.8 (L) 07/25/2020   ALBUMIN 3.3 (L) 07/23/2020   AST 27 07/23/2020   ALT 29 07/23/2020   ALKPHOS 71 07/23/2020   BILITOT 0.8 07/23/2020   GFRNONAA >60 07/25/2020   GFRAA >60 07/25/2020     Imaging and Diagnostic studies  MRI Brain with and without contrast on 07/24/20: 1. Stable MRI appearance of the brain since 07/13/2020, with no acute or inflammatory process identified. 2. Mild for age nonspecific white matter signal changes, most commonly due to chronic small vessel disease. 3. Minor paranasal sinus mucosal thickening with right nasoenteric tube in place.  MRA head without contrast on 07/24/20: 1. Intracranial MRA degraded by motion, such that the circle-of-Willis branch detail is degraded in the anterior circulation. There is no evidence of a large vessel vasculitis. 2. No large vessel occlusion or significant arterial stenosis identified. There is generalized arterial tortuosity.   Impression   Shawan Tosh is a 79 y.o. 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 Initially had intact mentation and refused LP. MRI brain with no hyperintensity, no contrast enhancement and therefore thought to be delirium. She had worsening mentation over the last few days with fever with Tmax of 102 despite Acyclovir so we were asked to assess her again. Neuro exam with continued AMS and ?some improvement vs waxing and waning. LP with CSF lymphoytic pleocytosis and elevated protein, normal glucose. Multple CSF studies are pending. Differential for AMS is broad and althou delirium can certainly cause waxing and waning encephalopathy but there are several red flags in her  case that stand out. Specifically the CSF findings and persistent fever despite treatment with Acyclovir. MRI Brain with and without contrast with no T2/FLAIR hyperintensity, no contrast enhancing lesion, MRA motion degraded but not concerning for large vessel vasculitis.  She was given Keppra load of 1500mg  once and started on Keppra 500mg  BID until she can be put on cEEG. I have ordered a routine EEG which is pending.  Recommendations  - Transfer to a higher level of care for cEEG - daughter prefers transfer to [facility] where patient has had much of her care. Daughter declined transfer to again today when I discussed with her over the phone. - I ordered rEEG - which is pending. - Continue Keppra 500mg  BID. If EEG is obtained and negative for seizure, recommend discontinuing Keppra. - Multiple CSF studies pending including workup for fungal and other viral, arboviral, crytococcus, lyme, syphilis and atypical causes of meningitis. Autoimmune encephalitis panel on CSF along with oligoclonal bands and IgG index. - We will cotninue to follow along.  ______________________________________________________________________   Thank you for the opportunity to take part in the care of this patient. If you have any further questions, please contact the neurology consultation attending.  Signed,  [facility] Pager Number [phone]

## 2020-07-25 NOTE — Plan of Care (Signed)

## 2020-07-26 DIAGNOSIS — R519 Headache, unspecified: Secondary | ICD-10-CM | POA: Diagnosis not present

## 2020-07-26 DIAGNOSIS — R079 Chest pain, unspecified: Secondary | ICD-10-CM

## 2020-07-26 DIAGNOSIS — R509 Fever, unspecified: Secondary | ICD-10-CM | POA: Diagnosis not present

## 2020-07-26 DIAGNOSIS — B029 Zoster without complications: Secondary | ICD-10-CM | POA: Diagnosis not present

## 2020-07-26 LAB — IGG CSF INDEX
Albumin CSF-mCnc: 144 mg/dL — ABNORMAL HIGH (ref 10–46)
Albumin: 3.4 g/dL — ABNORMAL LOW (ref 3.7–4.7)
CSF IgG Index: 1.1 — ABNORMAL HIGH (ref 0.0–0.7)
IgG (Immunoglobin G), Serum: 770 mg/dL (ref 586–1602)
IgG, CSF: 37.1 mg/dL — ABNORMAL HIGH (ref 0.0–6.7)
IgG/Alb Ratio, CSF: 0.26 — ABNORMAL HIGH (ref 0.00–0.25)

## 2020-07-26 LAB — MISC LABCORP TEST (SEND OUT)
LabCorp test name: 138412
Labcorp test code: 138412
Labcorp test code: 505265

## 2020-07-26 LAB — BASIC METABOLIC PANEL
Anion gap: 9 (ref 5–15)
BUN: 14 mg/dL (ref 8–23)
CO2: 26 mmol/L (ref 22–32)
Calcium: 8.6 mg/dL — ABNORMAL LOW (ref 8.9–10.3)
Chloride: 90 mmol/L — ABNORMAL LOW (ref 98–111)
Creatinine, Ser: 0.62 mg/dL (ref 0.44–1.00)
GFR calc Af Amer: >60 mL/min (ref >60)
GFR calc non Af Amer: >60 mL/min (ref >60)
Glucose, Bld: 173 mg/dL — ABNORMAL HIGH (ref 70–99)
Potassium: 3.9 mmol/L (ref 3.5–5.1)
Sodium: 125 mmol/L — ABNORMAL LOW (ref 135–145)

## 2020-07-26 LAB — GLUCOSE, CAPILLARY
Glucose-Capillary: 135 mg/dL — ABNORMAL HIGH (ref 70–99)
Glucose-Capillary: 137 mg/dL — ABNORMAL HIGH (ref 70–99)
Glucose-Capillary: 145 mg/dL — ABNORMAL HIGH (ref 70–99)
Glucose-Capillary: 148 mg/dL — ABNORMAL HIGH (ref 70–99)
Glucose-Capillary: 153 mg/dL — ABNORMAL HIGH (ref 70–99)
Glucose-Capillary: 161 mg/dL — ABNORMAL HIGH (ref 70–99)

## 2020-07-26 LAB — HSV DNA BY PCR (REFERENCE LAB)
HSV 1 DNA: NEGATIVE
HSV 2 DNA: NEGATIVE

## 2020-07-26 LAB — CRYPTOCOCCUS ANTIGEN, SERUM: Cryptococcus Antigen, Serum: NEGATIVE

## 2020-07-26 LAB — ENTEROVIRUS PCR: Enterovirus PCR: NEGATIVE

## 2020-07-26 MED ORDER — SODIUM CHLORIDE 0.9 % IV SOLN
750.0000 mg | Freq: Three times a day (TID) | INTRAVENOUS | Status: DC
  Administered 2020-07-26 – 2020-07-31 (×13): 750 mg via INTRAVENOUS
  Filled 2020-07-26 (×16): qty 15

## 2020-07-26 NOTE — Progress Notes (Signed)
NEUROLOGY CONSULTATION PROGRESS NOTE   Date of service: July 26, 2020 Patient Name: Casey Baldwin MRN:  [id number] DOB:  1941-10-13  Interval Hx:   Spiked a fever again with Tmax of 100.6.  She is more awake, tracks me across the room. rEEG with no seizures, notable for moderate encephalopathy.  Discussed with daughter over the phone and updated her on the EEG and MRI results. Discussed with RN at bedside to see if we can get her out of the bed today and into the chair.   Past History   Past Medical History:  Diagnosis Date  . Coronary artery disease   . High cholesterol   . Hypertension    Past Surgical History:  Procedure Laterality Date  . ABDOMINAL HYSTERECTOMY    . COLON RESECTION    . FACIAL COSMETIC SURGERY    . FRACTURE SURGERY     left leg and left ankle.  Marland Kitchen GASTRIC BYPASS    . left knee replacement     Family History  Problem Relation Age of Onset  . Colon cancer Mother    Social History   Socioeconomic History  . Marital status: Widowed    Spouse name: Not on file  . Number of children: Not on file  . Years of education: Not on file  . Highest education level: Not on file  Occupational History  . Not on file  Tobacco Use  . Smoking status: Never Smoker  . Smokeless tobacco: Never Used  Vaping Use  . Vaping Use: Never used  Substance and Sexual Activity  . Alcohol use: No  . Drug use: No  . Sexual activity: Not on file  Other Topics Concern  . Not on file  Social History Narrative  . Not on file   Social Determinants of Health   Financial Resource Strain:   . Difficulty of Paying Living Expenses: Not on file  Food Insecurity:   . Worried About Programme researcher, broadcasting/film/video in the Last Year: Not on file  . Ran Out of Food in the Last Year: Not on file  Transportation Needs:   . Lack of Transportation (Medical): Not on file  . Lack of Transportation (Non-Medical): Not on file  Physical Activity:   . Days of Exercise per Week: Not on file  .  Minutes of Exercise per Session: Not on file  Stress:   . Feeling of Stress : Not on file  Social Connections:   . Frequency of Communication with Friends and Family: Not on file  . Frequency of Social Gatherings with Friends and Family: Not on file  . Attends Religious Services: Not on file  . Active Member of Clubs or Organizations: Not on file  . Attends Banker Meetings: Not on file  . Marital Status: Not on file   Allergies  Allergen Reactions  . Lidocaine Anaphylaxis and Other (See Comments)    Became unconscious with administration for dental procedure at age 79yo. Pt tolerated subsequent lidocaine.    . Amlodipine Other (See Comments)    Hand and leg cramping  . Lactose Diarrhea  . Lactose Intolerance (Gi)   . Latex     Medications   Medications Prior to Admission  Medication Sig Dispense Refill Last Dose  . Acetaminophen 500 MG capsule Take 500 mg by mouth daily as needed.      . Artificial Tear Ointment (DRY EYES OP) Place 1-2 drops into both eyes daily as needed.     Marland Kitchen aspirin  81 MG EC tablet Take 81 mg by mouth daily.      . bisacodyl (DULCOLAX) 5 MG EC tablet Take 10 mg by mouth at bedtime.      . calcium carbonate (OS-CAL) 1250 (500 Ca) MG chewable tablet Chew 2 tablets by mouth daily.     . Cholecalciferol (VITAMIN D PO) Take 1 tablet by mouth daily.     . furosemide (LASIX) 20 MG tablet Take 20-40 mg by mouth daily. CAN TAKE 40MG  FOR SWELLING     . isosorbide mononitrate (IMDUR) 30 MG 24 hr tablet Take 30 mg by mouth daily.     . Multiple Vitamin (MULTIVITAMIN WITH MINERALS) TABS tablet Take 1 tablet by mouth daily.     . Multiple Vitamins-Minerals (ICAPS AREDS 2 PO) Take 1 capsule by mouth daily.     . ondansetron (ZOFRAN) 4 MG tablet Take 4 mg by mouth daily as needed.     . pantoprazole (PROTONIX) 20 MG tablet Take 20 mg by mouth daily.     . rosuvastatin (CRESTOR) 20 MG tablet Take 20 mg by mouth daily.     senna-docusate (SENOKOT-S) 8.6-50  MG tablet Take 2 tablets by mouth 2 (two) times daily. (Patient not taking: Reported on 04/01/2020) 120 tablet 0   . witch hazel-glycerin (TUCKS) pad Apply 1 application topically as needed for itching. (Patient not taking: Reported on 07/10/2020) 40 each 12      Vitals  Temp:  [98.6 F (37 C)-100.6 F (38.1 C)] 99.4 F (37.4 C) (09/16 0752) Pulse Rate:  [82-92] 82 (09/15 2352) Resp:  [16-20] 17 (09/16 0752) BP: (121-148)/(62-132) 132/82 (09/16 0800) SpO2:  [100 %] 100 % (09/15 2352) Weight:  [75.3 kg] 75.3 kg (09/16 0410)  Body mass index is 28.49 kg/m.  Physical Exam    Neurologic Examination  Mental status/Cognition: Opens eyes to voice, tracks my face across the room, does not answer any question, required less encouragement today to get her to follow commands.  Speech/language: Says "dont" when shining bright light in her eyes. No dysarthria noted. Cranial nerves:   CN II Pupils equal and reactive to light, blinks to threat and tracks face.   CN III,IV,VI No gaze preference, EOMI intact to dolls eyes   CN V    CN VII Symmetric facial grimace   CN VIII    CN IX & X    CN XI    CN XII    Motor:  Muscle bulk: normal, tone normal. Moves all extremities spontaneously and follows commands in all 4 with a lot of encouragement. Reflexes:  Right Left Comments  Pectoralis      Biceps (C5/6) 2 2   Brachioradialis (C5/6) 2 2    Triceps (C6/7) 2 2    Patellar (L3/4) 2 2    Achilles (S1) 1 1    Hoffman      Plantar     Jaw jerk     Sensation:  Light touch Regards touch in all extremities.   Pin prick    Temperature    Vibration   Proprioception    Coordination/Complex Motor:  Unable to assess.  Labs   Lab Results  Component Value Date   NA 125 (L) 07/26/2020   K 3.9 07/26/2020   CL 90 (L) 07/26/2020   CO2 26 07/26/2020   GLUCOSE 173 (H) 07/26/2020   BUN 14 07/26/2020   CREATININE 0.62 07/26/2020   CALCIUM 8.6 (L) 07/26/2020    ALBUMIN 3.3 (L)  07/23/2020   AST 27 07/23/2020   ALT 29 07/23/2020   ALKPHOS 71 07/23/2020   BILITOT 0.8 07/23/2020   GFRNONAA >60 07/26/2020   GFRAA >60 07/26/2020     Imaging and Diagnostic studies  MRI Brain with and without contrast on 07/24/20: 1. Stable MRI appearance of the brain since 07/13/2020, with no acute or inflammatory process identified. 2. Mild for age nonspecific white matter signal changes, most commonly due to chronic small vessel disease. 3. Minor paranasal sinus mucosal thickening with right nasoenteric tube in place.  MRA head without contrast on 07/24/20: 1. Intracranial MRA degraded by motion, such that the circle-of-Willis branch detail is degraded in the anterior circulation. There is no evidence of a large vessel vasculitis. 2. No large vessel occlusion or significant arterial stenosis identified. There is generalized arterial tortuosity.  rEEG on 9/16: This study is suggestive of moderate diffuse encephalopathy, nonspecific etiology. No seizures or epileptiform discharges were seen throughout the recording.   Impression   Tamiya Colello is a 79 y.o. 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 Initially had intact mentation and refused LP. MRI brain with no hyperintensity, no contrast enhancement and therefore thought to be delirium. She had worsening mentation over the last few days with fever with Tmax of 102 despite Acyclovir so we were asked to assess her again. Neuro exam with continued AMS and ?some improvement vs waxing and waning. LP with CSF lymphoytic pleocytosis and elevated protein, normal glucose. Multple CSF studies are pending. Differential for AMS is broad and althou delirium can certainly cause waxing and waning encephalopathy but there are several red flags in her case that stand  out. Specifically the CSF findings and persistent fever despite treatment with Acyclovir. MRI Brain with and without contrast with no T2/FLAIR hyperintensity, no contrast enhancing lesion, MRA motion degraded but not concerning for large vessel vasculitis.  She was given Keppra load of 1500mg  once and started on Keppra 500mg  BID until she can be put on cEEG. I have ordered a routine EEG which is pending.  Recommendations  - Transfer to a higher level of care for cEEG, given her csf studies, routine EEG cannot reliably rule out subclinical status - daughter prefers transfer to [facility] where patient has had much of her care. She is the [facility], we will respect her decision. - Routine EEG on 9/16 was negative for seizures, notable for moderate encephalopathy. Will get another routine EEG on 9/17. - Hold off on Keppra for now. - Multiple CSF studies pending including workup for fungal and other viral, arboviral, crytococcus, lyme, syphilis and atypical causes of meningitis. Autoimmune encephalitis panel on CSF along with oligoclonal bands and IgG index. - CSF HSV PCR neg, Enterovirus PCR neg, [place] CSF IgG is positive but IgM is negative, [place] serum IgM and IgG are both negative. - We will cotninue to follow along.  ______________________________________________________________________   Thank you for the opportunity to take part in the care of this patient. If you have any further questions, please contact the neurology consultation attending.  Signed,  10/16 [facility] Pager Number [phone]

## 2020-07-26 NOTE — Progress Notes (Signed)
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 Rash is intensely pruritic different stages of healing some areas of vesicular be red border. Some areas covered in black eschars. Skin is diffusely tender.Per family (asked by me) pt developed confusion. Was c/o side to back pain.  Sed rate was elevated. LP was considered but patient declined stating that she had a history of meningitis in the past and very scared of having LP done. Hospitalist was called for admission for shingles and possible encephalitis.  9/8.IV acyclovir was changed to Valtrex. Patient has not had a bowel movement since admission, has poor appetite. Giving lactulose, increase the Protonix to twice a day.  9/9.Patient refused CPAP last night, she became more confused today. She also has reversed sleeping cycle. Added Seroquel 25 mg evening. Schedule lactulose twice a day for constipation.  9/10.Patient slept last night with Seroquel. Still very confused and fatigued.Seen by neurologist, suspect metabolic source. Repeated CT scan of the abdomen/pelvis, intra-abdominal fluid collection still present.  9/13.Dobbhoff placed, tube feeding started. Also consult psychiatry for 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  9/14.  Patient mental status not improving.  Had a fever of 102 yesterday evening.  ID restart antibiotics with Rocephin and ampicillin.  LP is performed today LP is performed.  Study consistent with viral  meningitis/encephalitis.  Discussed with infect disease and neurology, neurology concerned for subacute seizure.  MRI of the brain and MRA will be performed.  EEG is also ordered.  However, patient will need a persistent EEG monitoring, initiate as process to transfer patient to a tertiary hospital.  I have started the process with [facility] per family request.  They do not want to transfer to [facility].  They are aware that the transfer process may take several days.  Also started seizure medicine per neurology. Discussed with DR. Veal from [facility] neurology, will arrange for transfer.   9/15: EEG: moderate diffuse encephlopahty, nonspecific. No seizure    Consultants:   Neurology, ID, interventional radiology  Procedures: Status post LP on 9/14  Antimicrobials:  Ampicillin and Rocephin. Acyclovir 9/11    Subjective: Pt opens her eyes for me today. Non verbal . In hand mittens. Daughter at bedside  Objective: Vitals:   07/26/20 0410 07/26/20 0752 07/26/20 0800 07/26/20 1545  BP:  (!) 145/132 132/82 139/66  Pulse:  92  90  Resp:  17  17  Temp:  99.4 F (37.4 C)  99.5 F (37.5 C)  TempSrc:  Oral  Oral  SpO2:  98%  100%  Weight: 75.3 kg     Height:        Intake/Output Summary (Last 24 hours) at 07/26/2020 1702 Last data filed at 07/26/2020 0511 Gross per 24 hour  Intake 875.04 ml  Output 1550 ml  Net -674.96 ml   Filed Weights   07/24/20 0500 07/25/20 0500 07/26/20 0410  Weight: 75.3 kg 76 kg 75.3 kg    Examination: Calm , comfortable NAD opens eyes when called her name and with stimuli CTA, no wheeze rales  rhonchi's Regular S1-S2 no murmurs rubs gallops Soft nontender nondistended positive bowel sounds Unable to assess neurologic exam No edema bilaterally     Data Reviewed: I have personally reviewed following labs and imaging studies  CBC: Recent Labs  Lab 07/20/20 0448 07/22/20 0439 07/23/20 1939 07/24/20 0705 07/25/20 0407  WBC 6.2  7.1 9.8 8.6 7.5  NEUTROABS 4.1 5.3 7.4 6.8 5.7  HGB 10.8* 10.1* 11.1* 10.5* 10.3*  HCT 32.3* 29.6* 31.2* 30.3* 29.8*  MCV 96.4 96.4 91.2 94.7 95.5  PLT 271 290 311 266 284   Basic Metabolic Panel: Recent Labs  Lab 07/20/20 0448 07/20/20 0448 07/21/20 0434 07/21/20 0434 07/22/20 0439 07/22/20 0439 07/23/20 1939 07/23/20 1939 07/24/20 0705 07/24/20 0941 07/24/20 1515 07/25/20 0407 07/26/20 0946  NA 130*   < > 131*   < > 134*   < > 127*   < > 125* 126* 127* 130* 125*  K 3.7   < > 4.0   < > 3.8  --  3.6  --  3.9  --   --  3.8 3.9  CL 96*   < > 99   < > 103  --  92*  --  93*  --   --  94* 90*  CO2 25   < > 23   < > 24  --  24  --  23  --   --  26 26  GLUCOSE 134*   < > 160*   < > 145*  --  143*  --  175*  --   --  126* 173*  BUN 9   < > 12   < > 12  --  12  --  12  --   --  11 14  CREATININE 0.81   < > 0.96   < > 0.79  --  0.62  --  0.76  --   --  0.61 0.62  CALCIUM 8.8*   < > 9.3   < > 8.9  --  9.2  --  8.3*  --   --  8.8* 8.6*  MG 1.9  --  2.0  --  1.9  --   --   --  1.7  --   --  1.9  --   PHOS  --   --  4.7*  --   --   --   --   --  3.4  --   --  3.9  --    < > = values in this interval not displayed.   GFR: Estimated Creatinine Clearance: 55.7 mL/min (by C-G formula based on SCr of 0.62 mg/dL). Liver Function Tests: Recent Labs  Lab 07/23/20 1939 07/24/20 1825  AST 27  --   ALT 29  --   ALKPHOS 71  --   BILITOT 0.8  --   PROT 7.0  --   ALBUMIN 3.3* 3.4*   No results for input(s): LIPASE, AMYLASE in the last 168 hours. No results for input(s): AMMONIA in the last 168 hours. Coagulation Profile: Recent Labs  Lab 07/24/20 0822  INR 1.1   Cardiac Enzymes: No results for input(s): CKTOTAL, CKMB, CKMBINDEX, TROPONINI in the last 168 hours. BNP (last 3 results) No results for input(s): PROBNP in the last 8760 hours. HbA1C: No results for input(s): HGBA1C in the last 72 hours. CBG: Recent Labs  Lab 07/26/20 0002 07/26/20 0407 07/26/20 0747 07/26/20 1116  07/26/20 1545  GLUCAP 145* 137* 161* 153* 135*  Lipid Profile: No results for input(s): CHOL, HDL, LDLCALC, TRIG, CHOLHDL, LDLDIRECT in the last 72 hours. Thyroid Function Tests: No results for input(s): TSH, T4TOTAL, FREET4, T3FREE, THYROIDAB in the last 72 hours. Anemia Panel: No results for input(s): VITAMINB12, FOLATE, FERRITIN, TIBC, IRON, RETICCTPCT in the last 72 hours. Sepsis Labs: Recent Labs  Lab 07/20/20 0448 07/22/20 0439 07/24/20 0705 07/25/20 0407  PROCALCITON <0.10 <0.10 <0.10 <0.10    Recent Results (from the past 240 hour(s))  CULTURE, BLOOD (ROUTINE X 2) w Reflex to ID Panel     Status: None   Collection Time: 07/16/20  5:36 PM   Specimen: BLOOD  Result Value Ref Range Status   Specimen Description BLOOD [facility]  Final   Special Requests   Final    BOTTLES DRAWN AEROBIC ONLY Blood Culture adequate volume   Culture   Final    NO GROWTH 5 DAYS Performed at [facility], [address] [place], [place] [address]    Report Status 07/21/2020 FINAL  Final  CULTURE, BLOOD (ROUTINE X 2) w Reflex to ID Panel     Status: None   Collection Time: 07/16/20  5:36 PM   Specimen: BLOOD  Result Value Ref Range Status   Specimen Description BLOOD [facility]  Final   Special Requests   Final    BOTTLES DRAWN AEROBIC AND ANAEROBIC Blood Culture adequate volume   Culture   Final    NO GROWTH 5 DAYS Performed at [facility], [address], [place], [place] [address]    Report Status 07/21/2020 FINAL  Final  CULTURE, BLOOD (ROUTINE X 2) w Reflex to ID Panel     Status: None (Preliminary result)   Collection Time: 07/23/20  7:46 PM   Specimen: BLOOD  Result Value Ref Range Status   Specimen Description BLOOD LEFT [facility]  Final   Special Requests   Final    BOTTLES DRAWN AEROBIC AND ANAEROBIC Blood Culture adequate volume   Culture   Final    NO GROWTH 3 DAYS Performed at [facility], [address], [place], [place] [address]    Report Status  PENDING  Incomplete  CULTURE, BLOOD (ROUTINE X 2) w Reflex to ID Panel     Status: None (Preliminary result)   Collection Time: 07/23/20  7:47 PM   Specimen: BLOOD  Result Value Ref Range Status   Specimen Description BLOOD RIGHT [facility]  Final   Special Requests   Final    BOTTLES DRAWN AEROBIC AND ANAEROBIC Blood Culture adequate volume   Culture   Final    NO GROWTH 3 DAYS Performed at [facility], [address], [place], [place] [address]    Report Status PENDING  Incomplete  Culture, fungus without smear     Status: None (Preliminary result)   Collection Time: 07/24/20  1:15 PM   Specimen: CSF; Cerebrospinal Fluid  Result Value Ref Range Status   Specimen Description   Final    CSF Performed at [facility], [address] [place], [place] [address]    Special Requests   Final    Normal Performed at [facility], [address], [place], [place] [address]    Culture   Final    NO FUNGUS ISOLATED AFTER 2 DAYS Performed at [facility], [address], [place], [place] [address]    Report Status PENDING  Incomplete  Anaerobic culture     Status: None (Preliminary result)   Collection  Time: 07/24/20  1:15 PM   Specimen: CSF; Cerebrospinal Fluid  Result Value Ref Range Status   Specimen Description   Final    CSF Performed at [facility], [address] [place], [place] [address]    Special Requests   Final    Normal Performed at [facility], [address] [place], [place] [address]    Culture   Final    NO ANAEROBES ISOLATED; CULTURE IN PROGRESS FOR 5 DAYS   Report Status PENDING  Incomplete  CSF culture     Status: None (Preliminary result)   Collection Time: 07/24/20  1:15 PM   Specimen: PATH Cytology CSF; Cerebrospinal Fluid  Result Value Ref Range Status   Specimen Description   Final    CSF Performed at [facility], [address] [place], [place] [address]    Special Requests   Final     NONE Performed at [facility], [address] [place], [place] [address]    Gram Stain   Final    WBC SEEN NO ORGANISMS SEEN Performed at [facility], [address] [place], [place] [address]    Culture   Final    NO GROWTH 2 DAYS Performed at [facility], [address] [place], [place] [address]    Report Status PENDING  Incomplete         Radiology Studies: EEG  Result Date: 07/25/2020 Charlsie QuestYadav, Priyanka O, MD     07/25/2020  5:19 PM Patient Name: Celesta GentileBrenda Finnell MRN: [id number] Epilepsy Attending: Charlsie QuestPriyanka O Yadav Referring Physician/Provider: Dr Erick Blinkssalman Khaliqdina Date: 07/25/2020 Duration: 21.01 mins Patient history: 79yo F with ams, facial twitching. EEG to evaluate for seizure Level of alertness: Awake AEDs during EEG study: Keppra Technical aspects: This EEG study was done with scalp electrodes positioned according to the 10-20 International system of electrode placement. Electrical activity was acquired at a sampling rate of 500Hz  and reviewed with a high frequency filter of 70Hz  and a low frequency filter of 1Hz . EEG data were recorded continuously and digitally stored. Description: No posterior dominant rhythm was seen. EEG showed continuous generalized 3 to 6 Hz theta-delta slowing. Photic driving was not seen during photic stimulation. Hyperventilation was not performed.   ABNORMALITY -Continuous slow, generalized IMPRESSION: This study is suggestive of moderate diffuse encephalopathy, nonspecific etiology. No seizures or epileptiform discharges were seen throughout the recording. Charlsie Questriyanka O Yadav   MR ANGIO HEAD WO CONTRAST  Result Date: 07/24/2020 CLINICAL DATA:  79 year old female with altered mental status, abnormal CSF on lumbar puncture today. Lymphocytic pleocytosis. Meningitis suspected. Query encephalitis or vasculitis. EXAM: MRA HEAD WITHOUT CONTRAST TECHNIQUE: Angiographic images of the Circle of Willis were obtained using MRA technique  without intravenous contrast. COMPARISON:  Brain MRI today reported separately. FINDINGS: Study is mildly degraded by motion artifact despite repeated imaging attempts. Antegrade flow in the posterior circulation. Tortuous distal vertebral arteries and basilar, without significant stenosis. Patent basilar tip, SCA and PCA origins. Left posterior communicating artery is present, the right is diminutive or absent. Bilateral PCA branches are within normal limits. Antegrade flow in both ICA siphons. Mildly tortuous cavernous ICAs. Ophthalmic and left posterior communicating artery origins are within normal limits. Patent carotid termini, MCA and ACA origins. Tortuous A1 segments. Anterior communicating artery appears normal. ACA branch detail limited by motion. MCA M1 segments are tortuous. MCA bifurcations are patent without stenosis. Grossly normal visible MCA branches, detail degraded by motion. IMPRESSION: 1. Intracranial MRA degraded  by motion, such that the circle-of-Willis branch detail is degraded in the anterior circulation. There is no evidence of a large vessel vasculitis. 2. No large vessel occlusion or significant arterial stenosis identified. There is generalized arterial tortuosity. Electronically Signed   By: Odessa Fleming M.D.   On: 07/24/2020 20:57   MR BRAIN W WO CONTRAST  Result Date: 07/24/2020 CLINICAL DATA:  79 year old female with altered mental status, abnormal CSF on lumbar puncture today. Lymphocytic pleocytosis. Meningitis suspected. Query encephalitis. EXAM: MRI HEAD WITHOUT AND WITH CONTRAST TECHNIQUE: Multiplanar, multiecho pulse sequences of the brain and surrounding structures were obtained without and with intravenous contrast. CONTRAST:  80mL GADAVIST GADOBUTROL 1 MMOL/ML IV SOLN COMPARISON:  Brain MRI 07/13/2020. FINDINGS: Brain: Stable cerebral volume and morphology from 07/13/2020. Mild ex vacuo appearing ventricular enlargement, temporal horns are decompressed. No intraventricular  debris. No ependymal enhancement. No restricted diffusion or evidence of acute infarction. No cerebral edema identified. Temporal lobes and insula appears stable and within normal limits. Mild for age and nonspecific mostly periventricular white matter T2 and FLAIR hyperintensity appears stable. No cortical encephalomalacia or chronic cerebral blood products. Deep gray nuclei, brainstem and cerebellum appear negative. No abnormal enhancement identified.  No dural thickening. No midline shift, mass effect, evidence of mass lesion, extra-axial collection or acute intracranial hemorrhage. Cervicomedullary junction and pituitary are within normal limits. Vascular: Major intracranial vascular flow voids are stable, with mild generalized intracranial artery tortuosity. The major dural venous sinuses seem to be enhancing and patent. Skull and upper cervical spine: Negative visible cervical spine. Bone marrow signal is stable and within normal limits. Sinuses/Orbits: Stable, negative orbits. Mild paranasal sinus mucosal thickening in the ethmoids and sphenoids. There is a right nasoenteric tube in place. No sinus fluid levels. Other: Mastoids remain well pneumatized. Scalp and face appear negative. IMPRESSION: 1. Stable MRI appearance of the brain since 07/13/2020, with no acute or inflammatory process identified. 2. Mild for age nonspecific white matter signal changes, most commonly due to chronic small vessel disease. 3. Minor paranasal sinus mucosal thickening with right nasoenteric tube in place. Electronically Signed   By: Odessa Fleming M.D.   On: 07/24/2020 20:53        Scheduled Meds: . Chlorhexidine Gluconate Cloth  6 each Topical Daily  . docusate sodium  100 mg Oral BID  . Ferrous Fumarate  1 tablet Oral BID  . free water  20 mL Per Tube Q4H  . insulin aspart  0-9 Units Subcutaneous Q4H  . lactulose  20 g Oral BID  . metoprolol tartrate  12.5 mg Oral BID  . multivitamin with minerals  1 tablet Per Tube  Daily  . pantoprazole (PROTONIX) IV  40 mg Intravenous Q12H  . rosuvastatin  20 mg Oral Daily  . sodium chloride flush  3 mL Intravenous Q12H  . tamsulosin  0.4 mg Oral QPC supper  . thiamine injection  100 mg Intravenous Daily   Continuous Infusions: . sodium chloride 10 mL/hr at 07/22/20 2119  . acyclovir    . acyclovir 760 mg (07/26/20 0450)  . feeding supplement (VITAL AF 1.2 CAL) 1,000 mL (07/24/20 1833)    Assessment & Plan:   Active Problems:   SIRS (systemic inflammatory response syndrome) (HCC)   Shingles rash   Delirium   Acute metabolic encephalopathy   Essential hypertension   Anemia   Acute lower UTI   Chronic diastolic CHF (congestive heart failure) (HCC)   Sepsis (HCC)   Change in mental status  Anorexia   Malnutrition of moderate degree   #1.  Acute metabolic cephalopathy. Less likely bacterial meningitis. S/p LP 9/14:lymphocytic pleocytosis with protein of 222. MRA/MRI no acute changes.  ceftrizone and ampicillin d/c;d Neurology following: EEG: no seizure  keppra d/c'd cx neg and HSV DNA neg.  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  Shingles with aseptic meningitis. Continue IV acyclovir day 14 of 21 ID following   #3.  Anorexia with poor p.o. intake. On TF, will continue    4.  Hyponatremia. Likely due to SIADH, patient does not have volume overload, does not seem to have dehydration.  TF free water was decreased, na improved, this am low again at 125. Will consult nephrology at this point.      5.  Sepsis secondary to UTI and viral infection. Improved.   6.  Intra-abdominal fluid collection. CT with fluid collection. Likely pseudocyst., seroma, or biloma, not abscess. IR does not recommend aspiration  7.  Urinary retention. Continue with foley  8.  Urinary  tract infection. Resolved.received ceftriaxone.  9.  Chronic diastolic congestive heart failure. No exaceration Continue to monitor   DVT prophylaxis: scd Code Status:full Family Communication: daughter updated  Status is: Inpatient  Remains inpatient appropriate because: inpatient level of care appropriate due to severity of illness  Dispo: The patient is from: Home              Anticipated d/c is to: SNF              Anticipated d/c date is: >3 days              Patient currently is not medically stable to dc            LOS: 13 days   Time spent: 35% with more than 50% on COC    Lynn Ito, MD [facility] Pager [phone]  If 7PM-7AM, please contact night-coverage [email or web address] Password [facility] 07/26/2020, 5:02 PM

## 2020-07-26 NOTE — Progress Notes (Signed)
eeg can be done 9/17 per dr Derry Lory

## 2020-07-26 NOTE — Progress Notes (Signed)
ID obtunded On shaking her she opens eyes and smiles and nods her head Does not speak Patient Vitals for the past 24 hrs:  BP Temp Temp src Pulse Resp SpO2 Weight  07/26/20 0800 132/82 -- -- -- -- -- --  07/26/20 0752 (!) 145/132 99.4 F (37.4 C) Oral -- 17 -- --  07/26/20 0410 -- -- -- -- -- -- 75.3 kg  07/25/20 2352 121/62 99.7 F (37.6 C) Oral 82 20 100 % --  07/25/20 2236 132/67 (!) 100.6 F (38.1 C) Oral 91 16 100 % --  07/25/20 1742 (!) 148/80 99.4 F (37.4 C) Axillary 92 18 100 % --  07/25/20 1334 139/68 98.6 F (37 C) Oral 83 18 100 % --    Chest b/l air entry Hs s1s2 abd soft Lesions on her rt back has healed CNS- moves limbs on tickling her feet   Labs CBC Latest Ref Rng & Units 07/25/2020 07/24/2020 07/23/2020  WBC 4.0 - 10.5 K/uL 7.5 8.6 9.8  Hemoglobin 12.0 - 15.0 g/dL 10.3(L) 10.5(L) 11.1(L)  Hematocrit 36 - 46 % 29.8(L) 30.3(L) 31.2(L)  Platelets 150 - 400 K/uL 284 266 311    CMP Latest Ref Rng & Units 07/26/2020 07/25/2020 07/24/2020  Glucose 70 - 99 mg/dL 160(F) 093(A) -  BUN 8 - 23 mg/dL 14 11 -  Creatinine 3.55 - 1.00 mg/dL 7.32 2.02 -  Sodium 542 - 145 mmol/L 125(L) 130(L) 127(L)  Potassium 3.5 - 5.1 mmol/L 3.9 3.8 -  Chloride 98 - 111 mmol/L 90(L) 94(L) -  CO2 22 - 32 mmol/L 26 26 -  Calcium 8.9 - 10.3 mg/dL 7.0(W) 2.3(J) -  Total Protein 6.5 - 8.1 g/dL - - -  Total Bilirubin 0.3 - 1.2 mg/dL - - -  Alkaline Phos 38 - 126 U/L - - -  AST 15 - 41 U/L - - -  ALT 0 - 44 U/L - - -   Labs CSF- WBC 271 ( 93% L, 0 N, RBC 0, protein 233, glucose 72)  HSV DNA neg Enterovirus PCR neg VDRL neg VZV PCR-pending WNV pending  CSF culture NG so far -48hrs  BC -9/13, 9/6, 9/2 negative  Imaging MRI/MRA 9/14- negative MRI with contrast 9/2 negative   EEG -9/15- no seizures or epileptiform discharges   Impression/recommendation Admitted on 9/12 with chest pain rt side, fever, headache and leg pain and apparently intermittent confusion as per  daughter Found to have herpes zoster Thoracic dermatome  Herpes zoster and presumed aseptic meningitis on admission as patient had refused LP- was started on IV acyclovir 10mg /Kg q 8. She was alert and verbal and appropriate on presentation but after 5-6 days gradual deterioration to obtundation. Seen by neuro- delirium was questioned -EEG on 07/20/20 no seizure activity..There were many reasons including changes in sleep rhythm, poor intake, constipation, not wearing CPAp ( but no co2 narcosis)  On 9/14  LP which revealed lymphocytic pleocytosis So far bacterial culture neg and HSV DNA neg- VZV pending Neuro saw her on 9/14 and was concerned for subclinical seizures and started keppra- wanted to get 24 hr EEg moitoring MRI and MRA done on 9/14 no acute changes. Daughter wanted her to be in 10/14, but no bed and hence Repeat EEG done on 07/25/20 no seizure activity noted So the diagnosis remains VZV induced aseptic meningitis ( possible encephalitis component VS encephalopathy) There was a concern for acyclovir induced neurotoxicity but she has normal renal function and is unusual to see it with  normal crcl.  Day 14 of acyclovir. Will continue for a total of 21 days  She has had hyponatremia ? SIADH  Urinary retention over the weekend -9/12- has foley    Rt sided abdominal firmness and fullness CT abdomen showsFluid collection at lies along the posterior margin of the pancreas, predominantly collecting along the right anterior pararenal fascia. This may reflect an abscess. It could be a pseudocyst if there is a history of pancreatitis. It may be a postoperative collection, since the patient underwent a cholecystectomy since the prior CT. Collection measures approximately 14 x 4 x 10 cm in size.  repeat CT show the same  collection which is thought to be either a pseudocyst, seroma or biloma and not abscess IR does not recommend aspiration.  Complicated stay at [facility] between 04/01/20 until  05/14/20 for abdominal pain which was diagnosed as choledocholithiasis/cholangitis in a setting of prior RYGB in 2008. 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 and she was taken to [facility] 04/05/20 for diagnostic lap, robotic lysis of adhesions, transgastric ERCP and biliary sphincterotomy, cholecystectomy, partial gastrectomy andaccidentalcolotomy which was repaired.The immediate post op was complicated by post ERCP pancreatitis , pulmonary edema with acute hypoxic resp failure , Afib ( treated with amio and metoprolol), fever and leucocytosis suspected due to pancreatitis and was treated with zosyn, UTI due to proteus and acute on chronic anemia needing PRBC and AKI.She was seen by gerontologist for insomnia and risk for delirium   H/o left TKA  Hearing loss  Discussed the management with care team

## 2020-07-26 NOTE — Progress Notes (Signed)
Pt laying in bed, head gestures appropriately. Scheduled meds given, call bell within reach

## 2020-07-26 NOTE — Care Management Important Message (Signed)
Important Message  Patient Details  Name: Casey Baldwin MRN: [id number] Date of Birth: 05/28/1941   Medicare Important Message Given:  Yes     [name] 07/26/2020, 1:05 PM

## 2020-07-26 NOTE — Plan of Care (Signed)
  Problem: Clinical Measurements: Goal: Ability to maintain clinical measurements within normal limits will improve Outcome: Progressing Goal: Will remain free from infection Outcome: Progressing Goal: Diagnostic test results will improve Outcome: Progressing Goal: Respiratory complications will improve Outcome: Progressing Goal: Cardiovascular complication will be avoided Outcome: Progressing   Problem: Activity: Goal: Risk for activity intolerance will decrease Outcome: Progressing   Problem: Nutrition: Goal: Adequate nutrition will be maintained Outcome: Progressing   Problem: Elimination: Goal: Will not experience complications related to urinary retention Outcome: Progressing   Problem: Pain Managment: Goal: General experience of comfort will improve Outcome: Progressing   Problem: Safety: Goal: Ability to remain free from injury will improve Outcome: Progressing   Problem: Skin Integrity: Goal: Risk for impaired skin integrity will decrease Outcome: Progressing   

## 2020-07-27 DIAGNOSIS — E440 Moderate protein-calorie malnutrition: Secondary | ICD-10-CM

## 2020-07-27 DIAGNOSIS — R519 Headache, unspecified: Secondary | ICD-10-CM | POA: Diagnosis not present

## 2020-07-27 DIAGNOSIS — R079 Chest pain, unspecified: Secondary | ICD-10-CM | POA: Diagnosis not present

## 2020-07-27 DIAGNOSIS — R509 Fever, unspecified: Secondary | ICD-10-CM | POA: Diagnosis not present

## 2020-07-27 DIAGNOSIS — B029 Zoster without complications: Secondary | ICD-10-CM | POA: Diagnosis not present

## 2020-07-27 LAB — GLUCOSE, CAPILLARY
Glucose-Capillary: 146 mg/dL — ABNORMAL HIGH (ref 70–99)
Glucose-Capillary: 147 mg/dL — ABNORMAL HIGH (ref 70–99)
Glucose-Capillary: 152 mg/dL — ABNORMAL HIGH (ref 70–99)
Glucose-Capillary: 153 mg/dL — ABNORMAL HIGH (ref 70–99)
Glucose-Capillary: 156 mg/dL — ABNORMAL HIGH (ref 70–99)
Glucose-Capillary: 157 mg/dL — ABNORMAL HIGH (ref 70–99)

## 2020-07-27 LAB — BASIC METABOLIC PANEL
Anion gap: 8 (ref 5–15)
BUN: 20 mg/dL (ref 8–23)
CO2: 29 mmol/L (ref 22–32)
Calcium: 8.6 mg/dL — ABNORMAL LOW (ref 8.9–10.3)
Chloride: 91 mmol/L — ABNORMAL LOW (ref 98–111)
Creatinine, Ser: 0.65 mg/dL (ref 0.44–1.00)
GFR calc Af Amer: >60 mL/min (ref >60)
GFR calc non Af Amer: >60 mL/min (ref >60)
Glucose, Bld: 152 mg/dL — ABNORMAL HIGH (ref 70–99)
Potassium: 4 mmol/L (ref 3.5–5.1)
Sodium: 128 mmol/L — ABNORMAL LOW (ref 135–145)

## 2020-07-27 LAB — VARICELLA-ZOSTER BY PCR: Varicella-Zoster, PCR: NEGATIVE

## 2020-07-27 LAB — CORTISOL: Cortisol, Plasma: 15.8 ug/dL

## 2020-07-27 LAB — TSH: TSH: 6.562 u[IU]/mL — ABNORMAL HIGH (ref 0.350–4.500)

## 2020-07-27 LAB — URIC ACID: Uric Acid, Serum: 2.1 mg/dL — ABNORMAL LOW (ref 2.5–7.1)

## 2020-07-27 MED ORDER — GABAPENTIN 100 MG PO CAPS: 100.0000 mg | ORAL_CAPSULE | Freq: Two times a day (BID) | ORAL | Status: DC

## 2020-07-27 MED ORDER — GABAPENTIN 100 MG PO CAPS
100.0000 mg | ORAL_CAPSULE | Freq: Two times a day (BID) | ORAL | Status: DC
  Administered 2020-07-27 – 2020-07-29 (×6): 100 mg via ORAL
  Filled 2020-07-27 (×7): qty 1

## 2020-07-27 NOTE — Progress Notes (Signed)
ID More alert, but still very confused Able to say when it hurts Working with PT in bed  doing some minimal movt Still very weak and deconditioned  Patient Vitals for the past 24 hrs:  BP Temp Temp src Pulse Resp SpO2  07/27/20 0808 (!) 133/58 99.1 F (37.3 C) Oral 89 16 100 %  07/26/20 2355 139/66 97.9 F (36.6 C) Oral 83 16 98 %  07/26/20 1819 -- 100.3 F (37.9 C) Oral -- -- --  07/26/20 1545 139/66 99.5 F (37.5 C) Oral 90 17 100 %    Chest b/l air entry [id number] abd soft fory NG tube Rt upper and lower extremity side more weak than left?  Labs CBC Latest Ref Rng & Units 07/25/2020 07/24/2020 07/23/2020  WBC 4.0 - 10.5 K/uL 7.5 8.6 9.8  Hemoglobin 12.0 - 15.0 g/dL 10.3(L) 10.5(L) 11.1(L)  Hematocrit 36 - 46 % 29.8(L) 30.3(L) 31.2(L)  Platelets 150 - 400 K/uL 284 266 311    CMP Latest Ref Rng & Units 07/27/2020 07/26/2020 07/25/2020  Glucose 70 - 99 mg/dL 622(W) 979(G) 921(J)  BUN 8 - 23 mg/dL 20 14 11   Creatinine 0.44 - 1.00 mg/dL 9.41 7.40  Sodium 135 - 145 mmol/L 128(L) 125(L) 130(L)  Potassium 3.5 - 5.1 mmol/L 4.0 3.9 3.8  Chloride 98 - 111 mmol/L 91(L) 90(L) 94(L)  CO2 22 - 32 mmol/L 29 26 26   Calcium 8.9 - 10.3 mg/dL 8.14) ) 4.8(J)  Total Protein 6.5 - 8.1 g/dL - - -  Total Bilirubin 0.3 - 1.2 mg/dL - - -  Alkaline Phos 38 - 126 U/L - - -  AST 15 - 41 U/L - - -  ALT 0 - 44 U/L - - -    ID work up CSF- HSV, enterovirus neg VZV pcr pending Not enough to do IgG and IgM WNV IgG positive in csf, IgM neg Serum WNV IgG and IgM neg Lyme pending Nmdar neg  CSF culture NG so far -72hrs  BC -9/13, 9/6, 9/2 negative  Imaging MRI/MRA 9/14- negative MRI with contrast 9/2 negative   EEG -9/15- no seizures or epileptiform discharges  Impression/recommendation Admitted on 9/12 with chest pain rt side, fever, headache and leg pain and apparently intermittent confusion as per daughter Found to have herpes zoster Thoracic dermatome  Herpes zoster  with  aseptic meningitis on admission ( patient had refused LP)- was started on IV acyclovir 10mg /Kg q 8. She was alert and verbal and appropriate on presentation but after 5-6 days gradual deterioration to obtundation. Seen by neuro- delirium was questioned -EEG on 07/20/20 no seizure activity..There were many reasons including changes in sleep rhythm, poor intake, constipation, not wearing CPAp ( but no co2 narcosis) hyponatremia for the obtundation On 9/14  Had  LP which revealed lymphocytic pleocytosis So far bacterial culture neg and HSV DNA neg- VZV pending Neuro saw her on 9/14 and was concerned for subclinical seizures and started keppra- wanted to get 24 hr EEg moitoring.  MRI and MRA done on 9/14 no acute changes. Daughter wanted her to be in 10/14, but no bed and hence Repeat EEG done on 07/25/20 no seizure activity noted. 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  9/17- more alert and more appropriate response but not back to baseline   Day 15 of acyclovir. Will continue for a total of 21 days  She has had hyponatremia ? SIADH  Urinary retention over the weekend -9/12- has foley    Rt sided abdominal firmness and fullness CT abdomen showsFluid collection at lies along the posterior margin of the pancreas, predominantly collecting along the right anterior pararenal fascia. This may reflect an abscess. It could be a pseudocyst if there is a history of pancreatitis. It may be a postoperative collection, since the patient underwent a cholecystectomy  since the prior CT. Collection measures approximately 14 x 4 x 10 cm in size.  repeat CT show the same  collection which is thought to be either a pseudocyst, seroma or biloma and not abscess IR does not recommend aspiration.  Complicated stay at [facility] between 04/01/20 until 05/14/20 for abdominal pain which was diagnosed as choledocholithiasis/cholangitis in a setting of prior RYGB in 2008. 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 and she was taken to [facility] 04/05/20 for diagnostic lap, robotic lysis of adhesions, transgastric ERCP and biliary sphincterotomy, cholecystectomy, partial gastrectomy andaccidentalcolotomy which was repaired.The immediate post op was complicated by post ERCP pancreatitis , pulmonary edema with acute hypoxic resp failure , Afib ( treated with amio and metoprolol), fever and leucocytosis suspected due to pancreatitis and was treated with zosyn, UTI due to proteus and acute on chronic anemia needing PRBC and AKI.She was seen by gerontologist for insomnia and risk for delirium   H/o left TKA  Hearing loss  Discussed the management with care team

## 2020-07-27 NOTE — Consult Note (Signed)
[facility] CONSULT NOTE    Date: 07/27/2020                  Patient Name:  Casey Baldwin  MRN: [id number]  DOB: [birth year]  Age / Sex: 79 y.o., female         PCP: Lesly Rubenstein, MD                 Service Requesting Consult:  Hospitalist                 Reason for Consult:  Hyponatremia            History of Present Illness: Patient is a 79 y.o. 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 Patient was found to have a dermatomal rash present upon admission. LP was performed on 07/24/2020. 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 Transfer to tertiary center has been recommended however patient still awaiting transfer. We are asked to see her for hyponatremia. Most recent serum sodium was 128. Her nutrition has been in question recently. She does have a Dobbhoff in place and is receiving feedings now.   Medications: Outpatient medications: Medications Prior to Admission  Medication Sig Dispense Refill Last Dose  . Acetaminophen 500 MG capsule Take 500 mg by mouth daily as needed.      . Artificial Tear Ointment (DRY EYES OP) Place 1-2 drops into both eyes daily as needed.     Marland Kitchen aspirin 81 MG EC tablet Take 81 mg by mouth daily.      . bisacodyl (DULCOLAX) 5 MG EC tablet Take 10 mg by mouth at bedtime.      . calcium carbonate (OS-CAL) 1250 (500 Ca) MG chewable tablet Chew 2 tablets by mouth daily.     . Cholecalciferol (VITAMIN D PO) Take 1 tablet by mouth daily.     . furosemide (LASIX) 20 MG tablet Take 20-40 mg by mouth daily. CAN TAKE  FOR SWELLING     . isosorbide mononitrate (IMDUR) 30 MG 24 hr tablet Take 30 mg by mouth daily.      . Multiple Vitamin (MULTIVITAMIN WITH MINERALS) TABS tablet Take 1 tablet by mouth daily.     . Multiple Vitamins-Minerals (ICAPS AREDS 2 PO) Take 1 capsule by mouth daily.     . ondansetron (ZOFRAN) 4 MG tablet Take 4 mg by mouth daily as needed.     . pantoprazole (PROTONIX) 20 MG tablet Take 20 mg by mouth daily.     . rosuvastatin (CRESTOR) 20 MG tablet Take 20 mg by mouth daily.     Marland Kitchen senna-docusate (SENOKOT-S) 8.6-50 MG tablet Take 2 tablets by mouth 2 (two) times daily. (Patient not taking: Reported on 04/01/2020) 120 tablet 0   . witch hazel-glycerin (TUCKS) pad Apply 1 application topically as needed for itching. (Patient not taking: Reported on 07/10/2020) 40 each 12     Current medications: Current Facility-Administered Medications  Medication Dose Route Frequency Provider Last Rate Last Admin  . 0.9 %  sodium chloride infusion   Intravenous PRN Marrion Coy, MD 10 mL/hr at 07/22/20 1191 Restarted at 07/22/20 4782  . acetaminophen (TYLENOL) tablet 650 mg  650 mg Oral Q6H PRN Therisa Doyne, MD   650 mg  at 07/27/20 0827   Or  . acetaminophen (TYLENOL) suppository 650 mg  650 mg Rectal Q6H PRN Therisa Doyneoutova, Anastassia, MD   650 mg at 07/24/20 1030  . acyclovir (ZOVIRAX) 750 mg in sodium chloride 0.9 % 150 mL IVPB  750 mg Intravenous Q8H Lynn ItoAmery, Sahar, MD 165 mL/hr at 07/27/20 1414 750 mg at 07/27/20 1414  . Chlorhexidine Gluconate Cloth 2 % PADS 6 each  6 each Topical Daily Marrion CoyZhang, Dekui, MD   6 each at 07/27/20 0841  . docusate sodium (COLACE) capsule 100 mg  100 mg Oral BID Therisa Doyneoutova, Anastassia, MD   100 mg at 07/27/20 0940  . feeding supplement (VITAL AF 1.2 CAL) liquid 1,000 mL  1,000 mL Per Tube Continuous Marrion CoyZhang, Dekui, MD 60 mL/hr at 07/26/20 2004 1,000 mL at 07/26/20 2004  . Ferrous Fumarate (HEMOCYTE - 106 mg FE) tablet 106 mg of iron  1 tablet Oral BID Lynn ItoAmery, Sahar, MD   106 mg of iron at 07/26/20 2312  . free water 20 mL  20 mL Per Tube Q4H Marrion CoyZhang, Dekui, MD   20 mL at 07/27/20  1217  . gabapentin (NEURONTIN) capsule 100 mg  100 mg Oral BID Lynn ItoAmery, Sahar, MD   100 mg at 07/27/20 1414  . haloperidol lactate (HALDOL) injection 1 mg  1 mg Intravenous Q6H PRN Marrion CoyZhang, Dekui, MD   1 mg at 07/22/20 1405  . insulin aspart (novoLOG) injection 0-9 Units  0-9 Units Subcutaneous Q4H Therisa Doyneoutova, Anastassia, MD   2 Units at 07/27/20 1255  . lactulose (CHRONULAC) 10 GM/15ML solution 20 g  20 g Oral BID Marrion CoyZhang, Dekui, MD   20 g at 07/27/20 0842  . metoprolol tartrate (LOPRESSOR) tablet 12.5 mg  12.5 mg Oral BID Lynn ItoAmery, Sahar, MD   12.5 mg at 07/27/20 0828  . multivitamin with minerals tablet 1 tablet  1 tablet Per Tube Daily Marrion CoyZhang, Dekui, MD   1 tablet at 07/27/20 0829  . ondansetron (ZOFRAN) tablet 4 mg  4 mg Oral Q6H PRN Doutova, Anastassia, MD       Or  . ondansetron (ZOFRAN) injection 4 mg  4 mg Intravenous Q6H PRN Doutova, Anastassia, MD   4 mg at 07/13/20 1148  . pantoprazole (PROTONIX) injection 40 mg  40 mg Intravenous Q12H Marrion CoyZhang, Dekui, MD   40 mg at 07/27/20 0833  . rosuvastatin (CRESTOR) tablet 20 mg  20 mg Oral Daily Doutova, Jonny RuizAnastassia, MD   20 mg at 07/27/20 0828  . sodium chloride flush (NS) 0.9 % injection 3 mL  3 mL Intravenous Q12H Doutova, Anastassia, MD   3 mL at 07/27/20 0840  . tamsulosin (FLOMAX) capsule 0.4 mg  0.4 mg Oral QPC supper Marrion CoyZhang, Dekui, MD   0.4 mg at 07/26/20 1730  . thiamine (B-1) injection 100 mg  100 mg Intravenous Daily Marrion CoyZhang, Dekui, MD   100 mg at 07/27/20 0941      Allergies: Allergies  Allergen Reactions  . Lidocaine Anaphylaxis and Other (See Comments)    Became unconscious with administration for dental procedure at age 79yo. Pt tolerated subsequent lidocaine.    . Amlodipine Other (See Comments)    Hand and leg cramping  . Lactose Diarrhea  . Lactose Intolerance (Gi)   . Latex       Past Medical History: Past Medical History:  Diagnosis Date  . Coronary artery disease   . High cholesterol   . Hypertension      Past Surgical  History: Past Surgical History:  Procedure  Laterality Date  . ABDOMINAL HYSTERECTOMY    . COLON RESECTION    . FACIAL COSMETIC SURGERY    . FRACTURE SURGERY     left leg and left ankle.  Marland Kitchen GASTRIC BYPASS    . left knee replacement       Family History: Family History  Problem Relation Age of Onset  . Colon cancer Mother      Social History: Social History   Socioeconomic History  . Marital status: Widowed    Spouse name: Not on file  . Number of children: Not on file  . Years of education: Not on file  . Highest education level: Not on file  Occupational History  . Not on file  Tobacco Use  . Smoking status: Never Smoker  . Smokeless tobacco: Never Used  Vaping Use  . Vaping Use: Never used  Substance and Sexual Activity  . Alcohol use: No  . Drug use: No  . Sexual activity: Not on file  Other Topics Concern  . Not on file  Social History Narrative  . Not on file   Social Determinants of Health   Financial Resource Strain:   . Difficulty of Paying Living Expenses: Not on file  Food Insecurity:   . Worried About Programme researcher, broadcasting/film/video in the Last Year: Not on file  . Ran Out of Food in the Last Year: Not on file  Transportation Needs:   . Lack of Transportation (Medical): Not on file  . Lack of Transportation (Non-Medical): Not on file  Physical Activity:   . Days of Exercise per Week: Not on file  . Minutes of Exercise per Session: Not on file  Stress:   . Feeling of Stress : Not on file  Social Connections:   . Frequency of Communication with Friends and Family: Not on file  . Frequency of Social Gatherings with Friends and Family: Not on file  . Attends Religious Services: Not on file  . Active Member of Clubs or Organizations: Not on file  . Attends Banker Meetings: Not on file  . Marital Status: Not on file  Intimate Partner Violence:   . Fear of Current or Ex-Partner: Not on file  . Emotionally Abused: Not on file  . Physically  Abused: Not on file  . Sexually Abused: Not on file     Review of Systems: Patient unable to fully concentrate on review of systems questions  Vital Signs: Blood pressure 133/69, pulse 96, temperature 99.7 F (37.6 C), temperature source Oral, resp. rate 16, height 5\' 4"  (1.626 m), weight 75.3 kg, SpO2 100 %.  Weight trends: Filed Weights   07/24/20 0500 07/25/20 0500 07/26/20 0410  Weight: 75.3 kg 76 kg 75.3 kg    Physical Exam: Physical Exam: General:  No acute distress  Head:  Normocephalic, atraumatic. Dry oral mucosal membranes  Eyes:  Anicteric  Neck:  Supple  Lungs:   Clear to auscultation, normal effort  Heart:  S1S2 no rubs  Abdomen:   Soft, nontender, bowel sounds present  Extremities:  No peripheral edema.  Neurologic:  Falls asleep easily but arousable. Will follow simple commands.           Lab results: Basic Metabolic Panel: Recent Labs  Lab 07/21/20 0434 07/21/20 0434 07/22/20 0439 07/23/20 1939 07/24/20 0705 07/24/20 0941 07/25/20 0407 07/26/20 0946 07/27/20 0412  NA 131*   < > 134*   < > 125*   < > 130* 125* 128*  K  4.0   < > 3.8   < > 3.9   < > 3.8 3.9 4.0  CL 99   < > 103   < > 93*   < > 94* 90* 91*  CO2 23   < > 24   < > 23   < > 26 26 29   GLUCOSE 160*   < > 145*   < > 175*   < > 126* 173* 152*  BUN 12   < > 12   < > 12   < > 11 14 20   CREATININE 0.96   < > 0.79   < > 0.76   < > 0.61 0.62 0.65  CALCIUM 9.3   < > 8.9   < > 8.3*   < > 8.8* 8.6* 8.6*  MG 2.0  --  1.9  --  1.7  --  1.9  --   --   PHOS 4.7*  --   --   --  3.4  --  3.9  --   --    < > = values in this interval not displayed.    Liver Function Tests: Recent Labs  Lab 07/23/20 1939 07/24/20 1825  AST 27  --   ALT 29  --   ALKPHOS 71  --   BILITOT 0.8  --   PROT 7.0  --   ALBUMIN 3.3* 3.4*   No results for input(s): LIPASE, AMYLASE in the last 168 hours. No results for input(s): AMMONIA in the last 168 hours.  CBC: Recent Labs  Lab 07/22/20 0439 07/22/20 0439  07/23/20 1939 07/24/20 0705 07/25/20 0407  WBC 7.1   < > 9.8 8.6 7.5  NEUTROABS 5.3   < > 7.4 6.8 5.7  HGB 10.1*   < > 11.1* 10.5* 10.3*  HCT 29.6*   < > 31.2* 30.3* 29.8*  MCV 96.4  --  91.2 94.7 95.5  PLT 290   < > 311 266 284   < > = values in this interval not displayed.    Cardiac Enzymes: No results for input(s): CKTOTAL, CKMB, CKMBINDEX, TROPONINI in the last 168 hours.  BNP: Invalid input(s): POCBNP  CBG: Recent Labs  Lab 07/26/20 2021 07/27/20 0014 07/27/20 0357 07/27/20 0810 07/27/20 1245  GLUCAP 148* 157* 147* 152* 156*    Microbiology: Results for orders placed or performed during the hospital encounter of 07/12/20  Urine culture     Status: Abnormal   Collection Time: 07/12/20  9:23 PM   Specimen: Urine, Random  Result Value Ref Range Status   Specimen Description   Final    URINE, RANDOM Performed at [facility], [address] [place], [address] [place]    Special Requests   Final    NONE Performed at [facility], [address] [place], [address] [place]    Culture MULTIPLE SPECIES PRESENT, SUGGEST RECOLLECTION (A)  Final   Report Status 07/15/2020 FINAL  Final  Culture, blood (routine x 2)     Status: None   Collection Time: 07/12/20  9:23 PM   Specimen: BLOOD  Result Value Ref Range Status   Specimen Description BLOOD RIGHT AC  Final   Special Requests   Final    BOTTLES DRAWN AEROBIC AND ANAEROBIC Blood Culture results may not be optimal due to an excessive volume of blood received in culture bottles   Culture   Final    NO GROWTH 5 DAYS Performed at [facility], [address], [place], [place] [address]    Report Status 07/17/2020 FINAL  Final  Culture, blood (routine x 2)     Status: None   Collection Time: 07/12/20  9:23 PM   Specimen: BLOOD  Result Value Ref Range Status   Specimen Description BLOOD RIGHT WRIST  Final   Special Requests   Final    BOTTLES DRAWN AEROBIC AND ANAEROBIC Blood  Culture adequate volume   Culture   Final    NO GROWTH 5 DAYS Performed at [facility], [address], [place], [place] [address]    Report Status 07/17/2020 FINAL  Final  SARS Coronavirus 2 by RT PCR (hospital order, performed in [facility] hospital lab) Nasopharyngeal Nasopharyngeal Swab     Status: None   Collection Time: 07/12/20 11:13 PM   Specimen: Nasopharyngeal Swab  Result Value Ref Range Status   SARS Coronavirus 2 NEGATIVE NEGATIVE Final    Comment: (NOTE) SARS-CoV-2 target nucleic acids are NOT DETECTED.  The SARS-CoV-2 RNA is generally detectable in upper and lower respiratory specimens during the acute phase of infection. The lowest concentration of SARS-CoV-2 viral copies this assay can detect is 250 copies / mL. A negative result does not preclude SARS-CoV-2 infection and should not be used as the sole basis for treatment or other patient management decisions.  A negative result may occur with improper specimen collection / handling, submission of specimen other than nasopharyngeal swab, presence of viral mutation(s) within the areas targeted by this assay, and inadequate number of viral copies (<250 copies / mL). A negative result must be combined with clinical observations, patient history, and epidemiological information.  Fact Sheet for Patients:   BoilerBrush.com.cy  Fact Sheet for Healthcare Providers: https://pope.com/  This test is not yet approved or  cleared by the [place] FDA and has been authorized for detection and/or diagnosis of SARS-CoV-2 by FDA under an Emergency Use Authorization (EUA).  This EUA will remain in effect (meaning this test can be used) for the duration of the COVID-19 declaration under Section 564(b)(1) of the Act, 21 U.S.C. section 360bbb-3(b)(1), unless the authorization is terminated or revoked sooner.  Performed at [facility], [address]  [place], [place] [address]   MRSA PCR Screening     Status: None   Collection Time: 07/13/20  2:02 AM   Specimen: Nasal Mucosa; Nasopharyngeal  Result Value Ref Range Status   MRSA by PCR NEGATIVE NEGATIVE Final    Comment:        The GeneXpert MRSA Assay (FDA approved for NASAL specimens only), is one component of a comprehensive MRSA colonization surveillance program. It is not intended to diagnose MRSA infection nor to guide or monitor treatment for MRSA infections. Performed at [facility], [address] [place], [place] [address]   CULTURE, BLOOD (ROUTINE X 2) w Reflex to ID Panel     Status: None   Collection Time: 07/16/20  5:36 PM   Specimen: BLOOD  Result Value Ref Range Status   Specimen Description BLOOD [facility]  Final   Special Requests   Final    BOTTLES DRAWN AEROBIC ONLY Blood Culture adequate volume   Culture   Final    NO GROWTH 5 DAYS Performed at [facility], [address], [place], [place] [address]    Report Status 07/21/2020 FINAL  Final  CULTURE, BLOOD (ROUTINE X 2) w Reflex to ID Panel     Status: None   Collection Time: 07/16/20  5:36 PM   Specimen: BLOOD  Result Value Ref Range Status   Specimen Description BLOOD [facility]  Final   Special Requests   Final    BOTTLES DRAWN AEROBIC AND ANAEROBIC Blood Culture adequate volume   Culture   Final    NO GROWTH 5 DAYS Performed at [facility], [address] [place], [place] [address]    Report Status 07/21/2020 FINAL  Final  CULTURE, BLOOD (ROUTINE X 2) w Reflex to ID Panel     Status: None (Preliminary result)   Collection Time: 07/23/20  7:46 PM   Specimen: BLOOD  Result Value Ref Range Status   Specimen Description BLOOD LEFT AC  Final   Special Requests   Final    BOTTLES DRAWN AEROBIC AND ANAEROBIC Blood Culture adequate volume   Culture   Final    NO GROWTH 4 DAYS Performed at [facility], [address] [place], [place] [address]    Report Status  PENDING  Incomplete  CULTURE, BLOOD (ROUTINE X 2) w Reflex to ID Panel     Status: None (Preliminary result)   Collection Time: 07/23/20  7:47 PM   Specimen: BLOOD  Result Value Ref Range Status   Specimen Description BLOOD RIGHT [facility]  Final   Special Requests   Final    BOTTLES DRAWN AEROBIC AND ANAEROBIC Blood Culture adequate volume   Culture   Final    NO GROWTH 4 DAYS Performed at [facility], [address], [place], [place] [address]    Report Status PENDING  Incomplete  Culture, fungus without smear     Status: None (Preliminary result)   Collection Time: 07/24/20  1:15 PM   Specimen: CSF; Cerebrospinal Fluid  Result Value Ref Range Status   Specimen Description   Final    CSF Performed at [facility], [address], [place], [place] [address]    Special Requests   Final    Normal Performed at [facility], [address] [place], [place] [address]    Culture   Final    NO FUNGUS ISOLATED AFTER 3 DAYS Performed at [facility], [address] [place], [place] [address]    Report Status PENDING  Incomplete  Anaerobic culture     Status: None (Preliminary result)   Collection Time: 07/24/20  1:15 PM   Specimen: CSF; Cerebrospinal Fluid  Result Value Ref Range Status   Specimen Description   Final    CSF Performed at [facility], [address] [place], [place] [address]    Special Requests   Final    Normal Performed at [facility], [address] [place], [place] [address]    Culture   Final    NO ANAEROBES ISOLATED; CULTURE IN PROGRESS FOR 5 DAYS   Report Status PENDING  Incomplete  CSF culture     Status: None (Preliminary result)   Collection Time: 07/24/20  1:15 PM   Specimen: PATH Cytology CSF; Cerebrospinal Fluid  Result Value Ref Range Status   Specimen Description   Final    CSF Performed at [facility], [address], [place], [place] [address]    Special Requests   Final     NONE Performed at [facility], [address], [place], [place] [address]    Gram Stain   Final    WBC SEEN NO ORGANISMS SEEN Performed at [facility], [address] [place], [place] [address]  Culture   Final    NO GROWTH 3 DAYS Performed at [facility], [address] [place], [place] [address]    Report Status PENDING  Incomplete    Coagulation Studies: No results for input(s): LABPROT, INR in the last 72 hours.  Urinalysis: No results for input(s): COLORURINE, LABSPEC, PHURINE, GLUCOSEU, HGBUR, BILIRUBINUR, KETONESUR, PROTEINUR, UROBILINOGEN, NITRITE, LEUKOCYTESUR in the last 72 hours.  Invalid input(s): APPERANCEUR    Imaging: EEG  Result Date: 07/27/2020 Charlsie Quest, MD     07/27/2020  2:43 PM Patient Name: Jordin Dambrosio MRN: [id number] Epilepsy Attending: Charlsie Quest Referring Physician/Provider: Dr Erick Blinks Date: 07/27/2020 Duration: 29.42 mins Patient history: 79yo F with ams. EEG to evaluate for seizure Level of alertness: Awake AEDs during EEG study: Gabapentin Technical aspects: This EEG study was done with scalp electrodes positioned according to the 10-20 International system of electrode placement. Electrical activity was acquired at a sampling rate of 500Hz  and reviewed with a high frequency filter of 70Hz  and a low frequency filter of 1Hz . EEG data were recorded continuously and digitally stored. Description: No posterior dominant rhythm was seen. EEG showed continuous generalized 3 to 6 Hz theta-delta slowing. Photic driving was not seen during photic stimulation. Hyperventilation was not performed.    ABNORMALITY -Continuous slow, generalized  IMPRESSION: This study is suggestive of moderate diffuse encephalopathy, nonspecific etiology. No seizures or epileptiform discharges were seen throughout the recording.    EEG  Result Date: 07/25/2020 , MD     07/25/2020  5:19 PM Patient  Name: Eather Chaires MRN: Charlsie Quest Epilepsy Attending: 07/27/2020 Referring Physician/Provider: Dr Celesta Gentile Date: 07/25/2020 Duration: 21.01 mins Patient history: 79yo F with ams, facial twitching. EEG to evaluate for seizure Level of alertness: Awake AEDs during EEG study: Keppra Technical aspects: This EEG study was done with scalp electrodes positioned according to the 10-20 International system of electrode placement. Electrical activity was acquired at a sampling rate of 500Hz  and reviewed with a high frequency filter of 70Hz  and a low frequency filter of 1Hz . EEG data were recorded continuously and digitally stored. Description: No posterior dominant rhythm was seen. EEG showed continuous generalized 3 to 6 Hz theta-delta slowing. Photic driving was not seen during photic stimulation. Hyperventilation was not performed.   ABNORMALITY -Continuous slow, generalized IMPRESSION: This study is suggestive of moderate diffuse encephalopathy, nonspecific etiology. No seizures or epileptiform discharges were seen throughout the recording. Priyanka Erick Blinks      Assessment & Plan: Pt is a 79 y.o. 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  1. Hyponatremia, could be due to some element of volume depletion. Encephalitis could also potentially be leading to SIADH. 2. 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 3. Diabetes mellitus type 2 without complication.  Plan: We are asked to see the patient for evaluation management of hyponatremia. Her nutrition and hydration has been in question however she does now have a Dobbhoff in place. Serum sodium did rise to 128. Continue tube feeds for now and  reevaluate serum sodium. 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 Tolvaptan could be considered but not immediately necessary at  the moment. We will check serum osmolality, urine osmolality, TSH, uric acid level for additional work-up. Thanks for consultation.

## 2020-07-27 NOTE — Plan of Care (Signed)
Pt eating a little applesauce and drinking from syringe. Pt seems more alert this shift. Problem: Education: Goal: Knowledge of General Education information will improve Description: Including pain rating scale, medication(s)/side effects and non-pharmacologic comfort measures Outcome: Progressing   Problem: Health Behavior/Discharge Planning: Goal: Ability to manage health-related needs will improve Outcome: Progressing   Problem: Clinical Measurements: Goal: Ability to maintain clinical measurements within normal limits will improve Outcome: Progressing Goal: Will remain free from infection Outcome: Progressing Goal: Diagnostic test results will improve Outcome: Progressing Goal: Respiratory complications will improve Outcome: Progressing Goal: Cardiovascular complication will be avoided Outcome: Progressing   Problem: Activity: Goal: Risk for activity intolerance will decrease Outcome: Progressing   Problem: Nutrition: Goal: Adequate nutrition will be maintained Outcome: Progressing   Problem: Coping: Goal: Level of anxiety will decrease Outcome: Progressing   Problem: Elimination: Goal: Will not experience complications related to bowel motility Outcome: Progressing Goal: Will not experience complications related to urinary retention Outcome: Progressing   Problem: Pain Managment: Goal: General experience of comfort will improve Outcome: Progressing   Problem: Safety: Goal: Ability to remain free from injury will improve Outcome: Progressing   Problem: Skin Integrity: Goal: Risk for impaired skin integrity will decrease Outcome: Progressing

## 2020-07-27 NOTE — Progress Notes (Signed)
NEUROLOGY CONSULTATION PROGRESS NOTE   Date of service: July 27, 2020 Patient Name: Casey Baldwin MRN:  [id number] DOB:  20-Sep-1941  Interval Hx:   Tmax of 100.3 overnight.  Mentation is much better. Speaking short sentences, more awake, oriented to self and place.  Repeat EEG is pending.   Past History   Past Medical History:  Diagnosis Date  . Coronary artery disease   . High cholesterol   . Hypertension    Past Surgical History:  Procedure Laterality Date  . ABDOMINAL HYSTERECTOMY    . COLON RESECTION    . FACIAL COSMETIC SURGERY    . FRACTURE SURGERY     left leg and left ankle.  Marland Kitchen GASTRIC BYPASS    . left knee replacement     Family History  Problem Relation Age of Onset  . Colon cancer Mother    Social History   Socioeconomic History  . Marital status: Widowed    Spouse name: Not on file  . Number of children: Not on file  . Years of education: Not on file  . Highest education level: Not on file  Occupational History  . Not on file  Tobacco Use  . Smoking status: Never Smoker  . Smokeless tobacco: Never Used  Vaping Use  . Vaping Use: Never used  Substance and Sexual Activity  . Alcohol use: No  . Drug use: No  . Sexual activity: Not on file  Other Topics Concern  . Not on file  Social History Narrative  . Not on file   Social Determinants of Health   Financial Resource Strain:   . Difficulty of Paying Living Expenses: Not on file  Food Insecurity:   . Worried About Programme researcher, broadcasting/film/video in the Last Year: Not on file  . Ran Out of Food in the Last Year: Not on file  Transportation Needs:   . Lack of Transportation (Medical): Not on file  . Lack of Transportation (Non-Medical): Not on file  Physical Activity:   . Days of Exercise per Week: Not on file  . Minutes of Exercise per Session: Not on file  Stress:   . Feeling of Stress : Not on file  Social Connections:   . Frequency of Communication with Friends and Family: Not on file  .  Frequency of Social Gatherings with Friends and Family: Not on file  . Attends Religious Services: Not on file  . Active Member of Clubs or Organizations: Not on file  . Attends Banker Meetings: Not on file  . Marital Status: Not on file   Allergies  Allergen Reactions  . Lidocaine Anaphylaxis and Other (See Comments)    Became unconscious with administration for dental procedure at age 8yo. Pt tolerated subsequent lidocaine.    . Amlodipine Other (See Comments)    Hand and leg cramping  . Lactose Diarrhea  . Lactose Intolerance (Gi)   . Latex     Medications   Medications Prior to Admission  Medication Sig Dispense Refill Last Dose  . Acetaminophen 500 MG capsule Take 500 mg by mouth daily as needed.      . Artificial Tear Ointment (DRY EYES OP) Place 1-2 drops into both eyes daily as needed.     Marland Kitchen aspirin 81 MG EC tablet Take 81 mg by mouth daily.      . bisacodyl (DULCOLAX) 5 MG EC tablet Take 10 mg by mouth at bedtime.      . calcium carbonate (OS-CAL) 1250 (  500 Ca) MG chewable tablet Chew 2 tablets by mouth daily.     . Cholecalciferol (VITAMIN D PO) Take 1 tablet by mouth daily.     . furosemide (LASIX) 20 MG tablet Take 20-40 mg by mouth daily. CAN TAKE 40MG  FOR SWELLING     . isosorbide mononitrate (IMDUR) 30 MG 24 hr tablet Take 30 mg by mouth daily.     . Multiple Vitamin (MULTIVITAMIN WITH MINERALS) TABS tablet Take 1 tablet by mouth daily.     . Multiple Vitamins-Minerals (ICAPS AREDS 2 PO) Take 1 capsule by mouth daily.     . ondansetron (ZOFRAN) 4 MG tablet Take 4 mg by mouth daily as needed.     . pantoprazole (PROTONIX) 20 MG tablet Take 20 mg by mouth daily.     . rosuvastatin (CRESTOR) 20 MG tablet Take 20 mg by mouth daily.     senna-docusate (SENOKOT-S) 8.6-50 MG tablet Take 2 tablets by mouth 2 (two) times daily. (Patient not taking: Reported on 04/01/2020) 120 tablet 0   . witch hazel-glycerin (TUCKS) pad Apply 1 application topically as  needed for itching. (Patient not taking: Reported on 07/10/2020) 40 each 12      Vitals  Temp:  [97.9 F (36.6 C)-100.3 F (37.9 C)] 99.1 F (37.3 C) (09/17 0808) Pulse Rate:  [83-90] 89 (09/17 0808) Resp:  [16-17] 16 (09/17 0808) BP: (133-139)/(58-66) 133/58 (09/17 0808) SpO2:  [98 %-100 %] 100 % (09/17 0808)  Body mass index is 28.49 kg/m.  Physical Exam    Neurologic Examination  Mental status/Cognition: Opens eyes spontaneously, tracks my face across the room, smiles and is oriented to self and place. Follows commands in both arms and legs.   Speech/language: No dysarthria noted. Cranial nerves:   CN II Pupils equal and reactive to light, blinks to threat and tracks face.   CN III,IV,VI No gaze preference, EOMI intact to dolls eyes   CN V    CN VII Symmetric facial smile   CN VIII    CN IX & X    CN XI    CN XII    Motor:  Muscle bulk: normal, tone normal. Follows commands in all extremities. Reflexes:  Right Left Comments  Pectoralis      Biceps (C5/6) 2 2   Brachioradialis (C5/6) 2 2    Triceps (C6/7) 2 2    Patellar (L3/4) 2 2    Achilles (S1) 1 1    Hoffman      Plantar     Jaw jerk     Sensation:  Light touch Regards touch in all extremities.   Pin prick    Temperature    Vibration   Proprioception    Coordination/Complex Motor:  No tremor with movements.  Labs   Lab Results  Component Value Date   NA 128 (L) 07/27/2020   K 4.0 07/27/2020   CL 91 (L) 07/27/2020   CO2 29 07/27/2020   GLUCOSE 152 (H) 07/27/2020   BUN 20 07/27/2020   CREATININE 0.65 07/27/2020   CALCIUM 8.6 (L) 07/27/2020   ALBUMIN 3.4 (L) 07/24/2020   AST 27 07/23/2020   ALT 29 07/23/2020   ALKPHOS 71 07/23/2020   BILITOT 0.8 07/23/2020   GFRNONAA >60 07/27/2020   GFRAA >60 07/27/2020     Imaging and Diagnostic studies  MRI Brain with and without contrast on 07/24/20: 1. Stable MRI appearance of the brain since 07/13/2020, with  no acute or inflammatory process identified.  2. Mild for age nonspecific white matter signal changes, most commonly due to chronic small vessel disease. 3. Minor paranasal sinus mucosal thickening with right nasoenteric tube in place.  MRA head without contrast on 07/24/20: 1. Intracranial MRA degraded by motion, such that the circle-of-Willis branch detail is degraded in the anterior circulation. There is no evidence of a large vessel vasculitis. 2. No large vessel occlusion or significant arterial stenosis identified. There is generalized arterial tortuosity.  rEEG on 9/16: This study is suggestive of moderate diffuse encephalopathy, nonspecific etiology. No seizures or epileptiform discharges were seen throughout the recording.   Impression   Casey Baldwin is a 79 y.o. 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 Initially had intact mentation and refused LP. MRI brain with no hyperintensity, no contrast enhancement and therefore thought to be delirium. She had worsening mentation with daily fevers despite Acyclovir so we were asked to assess her again. LP with CSF lymphoytic pleocytosis and elevated protein, normal glucose. Multple CSF studies.  Neuro exam has shown consistent improvement over the last couple of days. Initial concern was for subclinical seizure. However with negative rEEG and consistent improvement in exam, suspect that this is probably delirium with improvement on delirium precautions.  Repeat rEEG ordered and is pending.  Recommendations  - I think we can hold off on transfer if the repeat rEEG is negative and she continues to show improvement in exam. - Routine EEG on 9/16 was negative for seizures, notable for moderate encephalopathy. Will get another routine EEG on 9/17. - Multiple CSF studies  pending. - CSF Autoimmune encephalitis panel is negative. - CSF HSV PCR neg, Enterovirus PCR neg, [place] CSF IgG is positive but IgM is negative, [place] serum IgM and IgG are both negative. Likely false positive in the CSF. - We will cotninue to follow along.  ______________________________________________________________________   Thank you for the opportunity to take part in the care of this patient. If you have any further questions, please contact the neurology consultation attending.  Signed,  Erick Blinks [facility] Pager Number [phone]

## 2020-07-27 NOTE — TOC Progression Note (Signed)
Transition of Care [facility]) - Progression Note    Patient Details  Name: Casey Baldwin MRN: [id number] Date of Birth: [birth year]  Transition of [facility] CM/SW Contact  Eilleen Kempf, [place] Phone Number: 07/27/2020, 1:55 PM  Clinical Narrative:    As Per Physician, patient is going to transfer to [facility] when bed is available.        Expected Discharge Plan and Services                                                 Social Determinants of Health (SDOH) Interventions    Readmission Risk Interventions No flowsheet data found.

## 2020-07-27 NOTE — Evaluation (Signed)
Occupational Therapy Re-Evaluation Patient Details Name: Casey Baldwin MRN: [id number] DOB: 1941-07-24 Today's Date: 07/27/2020    History of Present Illness Casey Baldwin is a 79 y.o. 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  Presented to ER secondary to generalized body aches, progressive weakness, headaches; admitted for management of SIRS (urine?), shingles and acute metabolic encephalopathy.   Clinical Impression   Casey Baldwin was seen for OT re-evaluation this date. Pt requires touch and oral swab to become interactive c OT. Responds to questions c gestures and 1 or 2 word responses. Increasingly verbal when mobility attempted as pt yelled and said "stop it that hurts my neck." BUE shoulder flexion AROM WFL (when motivated), intermittently requires assist from OT to initiate, holds overhead ~20seconds. Grip 2-/5 following multimodal cues to initiate. Pt currently requires MIN A grasp spoon c built up handle - unable to functionally self-feed c HOH assist; TOTAL A self-feeding/drinking. Anticipate TOTAL A for toileting at bed level and MAX A x2 for ADL t/f. TOTAL A for LBD at bed level. Pt would benefit from skilled OT to address noted impairments and functional limitations (see below for any additional details) in order to maximize safety and independence while minimizing falls risk and caregiver burden. Upon hospital discharge, recommend STR to maximize pt safety and return to PLOF.     Follow Up Recommendations  SNF    Equipment Recommendations  Other (comment) (TBD)    Recommendations for Other Services       Precautions / Restrictions Precautions Precautions: Fall Restrictions Weight Bearing Restrictions: No      Mobility Bed Mobility Overal bed mobility: Needs Assistance      General bed mobility comments: MAX A adjust torso bed level - anticipate MAX A x2 for bed mobility (pt yells c  attempted movements)   Transfers  General transfer comment: deferred for safety        ADL either performed or assessed with clinical judgement   ADL Overall ADL's : Needs assistance/impaired      General ADL Comments: MIN A grasp spoon c built up handle - unable to functionally self-feed c HOH asssit - TOTAL A self-feeding/drinking. Anticipate TOTAL A for toileting at bed level. MAX A x2 for ADL t/f. TOTAL A for LBD at bed level     Pertinent Vitals/Pain Pain Assessment: Faces Faces Pain Scale: Hurts even more Pain Location: Neck c movements Pain Descriptors / Indicators: Grimacing;Moaning Pain Intervention(s): Limited activity within patient's tolerance;Repositioned;Monitored during session     Hand Dominance Right   Extremity/Trunk Assessment Upper Extremity Assessment Upper Extremity Assessment: RUE deficits/detail;LUE deficits/detail RUE Deficits / Details: Multimodal cues to inititate movements. AROM WFL (when motivated). Holds arms overhead ~20 seconds. Grip 2-/5 (requires input from OT to initiate grip)   Lower Extremity Assessment Lower Extremity Assessment: RLE deficits/detail;LLE deficits/detail RLE Deficits / Details: 2/5 grossly (initiates but unable to complete heel slide). PROM WFL  LLE Deficits / Details: 3-/5 grossly (performs heel slides). PROM WFL        Communication Communication Communication: HOH   Cognition Arousal/Alertness: Awake/alert;Lethargic Behavior During Therapy: Flat affect Overall Cognitive Status: Impaired/Different from baseline          General Comments: Pt requires touch and oral swab to become interactive c OT. Responds to questions c gestures and 1 or 2 word responses. Increasingly verbal when mobility attempted as pt yelled and said "stop it that hurts my neck"  General Comments       Exercises Exercises: General Upper Extremity;Other exercises;General Lower Extremity General Exercises - Upper Extremity Shoulder Flexion:  AAROM;Both;5 reps;Supine Shoulder Extension: AROM;Strengthening;Both;5 reps;Supine Elbow Flexion: AROM;Both;5 reps;Supine Elbow Extension: AROM;Both;5 reps;Supine General Exercises - Lower Extremity Heel Slides: AAROM;Both;5 reps;Supine Other Exercises Other Exercises: Pt educated re: importance of mobility for functional strengthening and contracture prevention, ECS, adapted feeding strategies Other Exercises: Self-drinking/feeding, bed level exercises, LBD, bed mobility, postioning         Home Living Family/patient expects to be discharged to:: Skilled nursing facility         Prior Functioning/Environment Level of Independence: Independent with assistive device(s)        Comments: PCA 6hrs/day supervises bathing / asssits c IADLs and a "maid" for cleaning. Enjoys hobbies of sewing/drawing         OT Problem List: Decreased strength;Decreased activity tolerance;Impaired balance (sitting and/or standing);Decreased coordination;Decreased safety awareness;Impaired UE functional use      OT Treatment/Interventions: Self-care/ADL training;Therapeutic exercise;Energy conservation;DME and/or AE instruction;Therapeutic activities;Patient/family education;Balance training    OT Goals(Current goals can be found in the care plan section) Acute Rehab OT Goals Patient Stated Goal: To return home  OT Goal Formulation: With family Time For Goal Achievement: 08/10/20 Potential to Achieve Goals: Fair ADL Goals Pt Will Perform Grooming: with min assist;with caregiver independent in assisting;bed level Pt Will Perform Lower Body Dressing: with mod assist;bed level;with caregiver independent in assisting Pt Will Transfer to Toilet: with mod assist (rolling at bed level)  OT Frequency: Min 1X/week   Barriers to D/C: Inaccessible home environment;Decreased caregiver support             AM-PAC OT "6 Clicks" Daily Activity     Outcome Measure Help from another person eating meals?:  Total Help from another person taking care of personal grooming?: A Lot Help from another person toileting, which includes using toliet, bedpan, or urinal?: Total Help from another person bathing (including washing, rinsing, drying)?: A Lot Help from another person to put on and taking off regular upper body clothing?: A Lot Help from another person to put on and taking off regular lower body clothing?: Total 6 Click Score: 9   End of Session Nurse Communication: Mobility status  Activity Tolerance: Patient limited by fatigue Patient left: in bed;with call bell/phone within reach;with nursing/sitter in room  OT Visit Diagnosis: Other abnormalities of gait and mobility (R26.89)                Time: 4782-9562 OT Time Calculation (min): 35 min Charges:  OT General Charges $OT Visit: 1 Visit OT Evaluation $OT Re-eval: 1 Re-eval OT Treatments $Self Care/Home Management : 23-37 mins  Kathie Dike, M.S. OTR/L  07/27/20, 10:49 AM  ascom [phone]

## 2020-07-27 NOTE — Progress Notes (Signed)
eeg done °

## 2020-07-27 NOTE — Progress Notes (Signed)
Physical Therapy Treatment Patient Details Name: Casey Baldwin MRN: [id number] DOB: 05-Oct-1941 Today's Date: 07/27/2020    History of Present Illness Casey Baldwin is a 79 y.o. 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  Presented to ER secondary to generalized body aches, progressive weakness, headaches; admitted for management of SIRS (urine?), shingles and acute metabolic encephalopathy.    PT Comments    Pt resting in bed with eyes open upon PT arrival.  Pt did not state her name or respond to any A&O questions although pt would occasional verbalize during session.  Wiggled her toes B LE's with vc's but did not initiate any other movement during session when cued.  Pt shook her head no when therapist asked pt about getting OOB but then pt stated "I walk; I walk".  Attempted to get pt OOB but pt was max to total assist logrolling to L in bed so deferred further mobility (pt with minimal participation).  D/t pt's extended hospitalization, pt will require re-evaluation next PT session.    Follow Up Recommendations  SNF     Equipment Recommendations   (TBD at next facility)    Recommendations for Other Services OT consult     Precautions / Restrictions Precautions Precautions: Fall Restrictions Weight Bearing Restrictions: No Other Position/Activity Restrictions: NG tube; HOB >30 degrees    Mobility  Bed Mobility Overal bed mobility: Needs Assistance Bed Mobility: Rolling Rolling: Max assist;Total assist         General bed mobility comments: very minimal initiation to assist with bed mobility noted  Transfers                    Ambulation/Gait                 Stairs             Wheelchair Mobility    Modified Rankin (Stroke Patients Only)       Balance                                            Cognition Arousal/Alertness:  Awake/alert Behavior During Therapy: Flat affect Overall Cognitive Status: No family/caregiver present to determine baseline cognitive functioning                                 General Comments: Pt did not answer A&O questions; able to follow simple 1 step commands occasionally      Exercises      General Comments   Nursing cleared pt for participation in physical therapy.      Pertinent Vitals/Pain Pain Assessment: Faces Faces Pain Scale: No hurt Pain Intervention(s): Limited activity within patient's tolerance;Monitored during session;Repositioned  Vitals (HR and O2 on supplemental O2 via nasal cannula) stable and WFL throughout treatment session.    Home Living                      Prior Function            PT Goals (current goals can now be found in the care plan section) Acute Rehab PT Goals Patient Stated Goal: To return home  PT Goal Formulation: With patient Time For Goal Achievement: 07/27/20 Potential to Achieve Goals: Poor  Progress towards PT goals: Progressing toward goals    Frequency    Min 2X/week      PT Plan Current plan remains appropriate    Co-evaluation              AM-PAC PT "6 Clicks" Mobility   Outcome Measure  Help needed turning from your back to your side while in a flat bed without using bedrails?: Total Help needed moving from lying on your back to sitting on the side of a flat bed without using bedrails?: Total Help needed moving to and from a bed to a chair (including a wheelchair)?: Total Help needed standing up from a chair using your arms (e.g., wheelchair or bedside chair)?: Total   Help needed climbing 3-5 steps with a railing? : Total 6 Click Score: 5    End of Session Equipment Utilized During Treatment: Gait belt Activity Tolerance: Patient tolerated treatment well Patient left: in bed;with call bell/phone within reach;with bed alarm set Nurse Communication: Mobility  status;Precautions PT Visit Diagnosis: Muscle weakness (generalized) (M62.81);Difficulty in walking, not elsewhere classified (R26.2)     Time: 5498-2641 PT Time Calculation (min) (ACUTE ONLY): 9 min  Charges:  $Therapeutic Activity: 8-22 mins                    Hendricks Limes, PT 07/27/20, 4:52 PM

## 2020-07-27 NOTE — Procedures (Signed)
Patient Name: Casey Baldwin  MRN: [id number]  Epilepsy Attending: Charlsie Quest  Referring Physician/Provider: Dr Erick Blinks Date: 07/27/2020  Duration: 29.42 mins  Patient history: 79yo F with ams. EEG to evaluate for seizure  Level of alertness: Awake  AEDs during EEG study: Gabapentin  Technical aspects: This EEG study was done with scalp electrodes positioned according to the 10-20 International system of electrode placement. Electrical activity was acquired at a sampling rate of 500Hz  and reviewed with a high frequency filter of 70Hz  and a low frequency filter of 1Hz . EEG data were recorded continuously and digitally stored.   Description: No posterior dominant rhythm was seen. EEG showed continuous generalized 3 to 6 Hz theta-delta slowing. Photic driving was not seen during photic stimulation. Hyperventilation was not performed.     ABNORMALITY -Continuous slow, generalized  IMPRESSION: This study is suggestive of moderate diffuse encephalopathy, nonspecific etiology. No seizures or epileptiform discharges were seen throughout the recording.  Lynia Landry 

## 2020-07-27 NOTE — Progress Notes (Addendum)
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 Rash is intensely pruritic different stages of healing some areas of vesicular be red border. Some areas covered in black eschars. Skin is diffusely tender.Per family (asked by me) pt developed confusion. Was c/o side to back pain.  Sed rate was elevated. LP was considered but patient declined stating that she had a history of meningitis in the past and very scared of having LP done. Hospitalist was called for admission for shingles and possible encephalitis.  9/8.IV acyclovir was changed to Valtrex. Patient has not had a bowel movement since admission, has poor appetite. Giving lactulose, increase the Protonix to twice a day.  9/9.Patient refused CPAP last night, she became more confused today. She also has reversed sleeping cycle. Added Seroquel 25 mg evening. Schedule lactulose twice a day for constipation.  9/10.Patient slept last night with Seroquel. Still very confused and fatigued.Seen by neurologist, suspect metabolic source. Repeated CT scan of the abdomen/pelvis, intra-abdominal fluid collection still present.  9/13.Dobbhoff placed, tube feeding started. Also consult psychiatry for 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  9/14.  Patient mental status not improving.  Had a fever of 102 yesterday evening.  ID restart antibiotics with Rocephin and ampicillin.  LP is performed today LP is performed.  Study consistent with viral  meningitis/encephalitis.  Discussed with infect disease and neurology, neurology concerned for subacute seizure.  MRI of the brain and MRA will be performed.  EEG is also ordered.  However, patient will need a persistent EEG monitoring, initiate as process to transfer patient to a tertiary hospital.  I have started the process with [facility] per family request.  They do not want to transfer to [facility]  They are aware that the transfer process may take several days.  Also started seizure medicine per neurology. Discussed with DR. Veal from [facility] neurology, will arrange for transfer.   9/15: EEG: moderate diffuse encephlopahty, nonspecific. No seizure   9/17 more interactive today. EEG done.  Consultants:   Neurology, ID, interventional radiology  Procedures: Status post LP on 9/14  Antimicrobials:  Ampicillin and Rocephin. Acyclovir 9/11    Subjective: Daughter at bedside.  Patient does mumble some words today and more awake than other days.  She does complain of achy body and legs.  Had achiness in her legs previously too.   Objective: Vitals:   07/26/20 1545 07/26/20 1819 07/26/20 2355 07/27/20 0808  BP: 139/66  139/66 (!) 133/58  Pulse: 90  83 89  Resp: 17  16 16   Temp: 99.5 F (37.5 C) 100.3 F (37.9 C) 97.9 F (36.6 C) 99.1 F (37.3 C)  TempSrc: Oral Oral Oral Oral  SpO2: 100%  98% 100%  Weight:      Height:        Intake/Output Summary (Last 24 hours) at 07/27/2020 1301 Last data filed at 07/27/2020 0900 Gross per 24 hour  Intake 400 ml  Output 1250 ml  Net -850 ml   Filed Weights   07/24/20 0500 07/25/20 0500 07/26/20 0410  Weight: 75.3 kg 76 kg 75.3 kg    Examination: Opens eyes today.  Little bit more interactive than previous days.  Tries to respond to my questions. Calm, comfortable NAD Lungs clear to auscultation bilaterally no wheeze rales rhonchi's Regular S1-S2 no murmurs rubs gallops Soft positive bowel sounds nontender  nondistended No edema.     Data Reviewed: I have personally reviewed following labs and imaging studies  CBC: Recent Labs  Lab 07/22/20 0439 07/23/20 1939 07/24/20 0705 07/25/20 0407  WBC 7.1 9.8 8.6 7.5  NEUTROABS 5.3 7.4 6.8 5.7  HGB 10.1* 11.1* 10.5* 10.3*  HCT 29.6* 31.2* 30.3* 29.8*  MCV 96.4 91.2 94.7 95.5  PLT 290 311 266 284   Basic Metabolic Panel: Recent Labs  Lab 07/21/20 0434 07/21/20 0434 07/22/20 0439 07/22/20 0439 07/23/20 1939 07/23/20 1939 07/24/20 0705 07/24/20 0705 07/24/20 0941 07/24/20 1515 07/25/20 0407 07/26/20 0946 07/27/20 0412  NA 131*   < > 134*   < > 127*   < > 125*   < > 126* 127* 130* 125* 128*  K 4.0   < > 3.8   < > 3.6  --  3.9  --   --   --  3.8 3.9 4.0  CL 99   < > 103   < > 92*  --  93*  --   --   --  94* 90* 91*  CO2 23   < > 24   < > 24  --  23  --   --   --  26 26 29   GLUCOSE 160*   < > 145*   < > 143*  --  175*  --   --   --  126* 173* 152*  BUN 12   < > 12   < > 12  --  12  --   --   --  11 14 20   CREATININE 0.96   < > 0.79   < > 0.62  --  0.76  --   --   --  0.61 0.62 0.65  CALCIUM 9.3   < > 8.9   < > 9.2  --  8.3*  --   --   --  8.8* 8.6* 8.6*  MG 2.0  --  1.9  --   --   --  1.7  --   --   --  1.9  --   --   PHOS 4.7*  --   --   --   --   --  3.4  --   --   --  3.9  --   --    < > = values in this interval not displayed.   GFR: Estimated Creatinine Clearance: 55.7 mL/min (by C-G formula based on SCr of 0.65 mg/dL). Liver Function Tests: Recent Labs  Lab 07/23/20 1939 07/24/20 1825  AST 27  --   ALT 29  --   ALKPHOS 71  --   BILITOT 0.8  --   PROT 7.0  --   ALBUMIN 3.3* 3.4*   No results for input(s): LIPASE, AMYLASE in the last 168 hours. No results for input(s): AMMONIA in the last 168 hours. Coagulation Profile: Recent Labs  Lab 07/24/20 0822  INR 1.1   Cardiac Enzymes: No results for input(s): CKTOTAL, CKMB, CKMBINDEX, TROPONINI in the last 168 hours. BNP (last 3 results) No results for input(s):  PROBNP in the last 8760 hours. HbA1C: No results for input(s): HGBA1C in  the last 72 hours. CBG: Recent Labs  Lab 07/26/20 2021 07/27/20 0014 07/27/20 0357 07/27/20 0810 07/27/20 1245  GLUCAP 148* 157* 147* 152* 156*   Lipid Profile: No results for input(s): CHOL, HDL, LDLCALC, TRIG, CHOLHDL, LDLDIRECT in the last 72 hours. Thyroid Function Tests: No results for input(s): TSH, T4TOTAL, FREET4, T3FREE, THYROIDAB in the last 72 hours. Anemia Panel: No results for input(s): VITAMINB12, FOLATE, FERRITIN, TIBC, IRON, RETICCTPCT in the last 72 hours. Sepsis Labs: Recent Labs  Lab 07/22/20 0439 07/24/20 0705 07/25/20 0407  PROCALCITON <0.10 <0.10 <0.10    Recent Results (from the past 240 hour(s))  CULTURE, BLOOD (ROUTINE X 2) w Reflex to ID Panel     Status: None (Preliminary result)   Collection Time: 07/23/20  7:46 PM   Specimen: BLOOD  Result Value Ref Range Status   Specimen Description BLOOD LEFT [facility]  Final   Special Requests   Final    BOTTLES DRAWN AEROBIC AND ANAEROBIC Blood Culture adequate volume   Culture   Final    NO GROWTH 4 DAYS Performed at [facility], [address] [place], [place] [address]    Report Status PENDING  Incomplete  CULTURE, BLOOD (ROUTINE X 2) w Reflex to ID Panel     Status: None (Preliminary result)   Collection Time: 07/23/20  7:47 PM   Specimen: BLOOD  Result Value Ref Range Status   Specimen Description BLOOD RIGHT [facility]  Final   Special Requests   Final    BOTTLES DRAWN AEROBIC AND ANAEROBIC Blood Culture adequate volume   Culture   Final    NO GROWTH 4 DAYS Performed at [facility], [address] [place], [place] [address]    Report Status PENDING  Incomplete  Culture, fungus without smear     Status: None (Preliminary result)   Collection Time: 07/24/20  1:15 PM   Specimen: CSF; Cerebrospinal Fluid  Result Value Ref Range Status   Specimen Description   Final    CSF Performed at [facility],  [address] [place], [place] [address]    Special Requests   Final    Normal Performed at [facility], [address], [place], [place] [address]    Culture   Final    NO FUNGUS ISOLATED AFTER 2 DAYS Performed at [facility], [address] [place], [place] [address]    Report Status PENDING  Incomplete  Anaerobic culture     Status: None (Preliminary result)   Collection Time: 07/24/20  1:15 PM   Specimen: CSF; Cerebrospinal Fluid  Result Value Ref Range Status   Specimen Description   Final    CSF Performed at [facility], [address], [place], [place] [address]    Special Requests   Final    Normal Performed at [facility], [address] [place], [place] [address]    Culture   Final    NO ANAEROBES ISOLATED; CULTURE IN PROGRESS FOR 5 DAYS   Report Status PENDING  Incomplete  CSF culture     Status: None (Preliminary result)   Collection Time: 07/24/20  1:15 PM   Specimen: PATH Cytology CSF; Cerebrospinal Fluid  Result Value Ref Range Status   Specimen Description   Final    CSF Performed at [facility], [address], [place], [place] [address]    Special Requests   Final    NONE Performed at [facility], [address] [place], [place] [address]    Gram Stain   Final    WBC SEEN NO ORGANISMS SEEN Performed at [facility], [address], [place], [place] [address]    Culture   Final    NO GROWTH 3 DAYS Performed at [facility], [address] [place], [place] [address]    Report Status PENDING  Incomplete         Radiology Studies: EEG  Result Date: 07/25/2020 Casey Quest, MD     07/25/2020  5:19 PM Patient Name: Casey Baldwin MRN: [id number] Epilepsy Attending: Charlsie Baldwin Referring Physician/Provider: Dr Erick Blinks Date: 07/25/2020 Duration: 21.01 mins Patient history: 79yo F with ams, facial twitching. EEG to evaluate for seizure Level of  alertness: Awake AEDs during EEG study: Keppra Technical aspects: This EEG study was done with scalp electrodes positioned according to the 10-20 International system of electrode placement. Electrical activity was acquired at a sampling rate of  and reviewed with a high frequency filter of  and a low frequency filter of . EEG data were recorded continuously and digitally stored. Description: No posterior dominant rhythm was seen. EEG showed continuous generalized 3 to 6 Hz theta-delta slowing. Photic driving was not seen during photic stimulation. Hyperventilation was not performed.   ABNORMALITY -Continuous slow, generalized IMPRESSION: This study is suggestive of moderate diffuse encephalopathy, nonspecific etiology. No seizures or epileptiform discharges were seen throughout the recording. Casey Baldwin        Scheduled Meds: . Chlorhexidine Gluconate Cloth  6 each Topical Daily  . docusate sodium  100 mg Oral BID  . Ferrous Fumarate  1 tablet Oral BID  . free water  20 mL Per Tube Q4H  . insulin aspart  0-9 Units Subcutaneous Q4H  . lactulose  20 g Oral BID  . metoprolol tartrate  12.5 mg Oral BID  . multivitamin with minerals  1 tablet Per Tube Daily  . pantoprazole (PROTONIX) IV  40 mg Intravenous Q12H  . rosuvastatin  20 mg Oral Daily  . sodium chloride flush  3 mL Intravenous Q12H  . tamsulosin  0.4 mg Oral QPC supper  . thiamine injection  100 mg Intravenous Daily   Continuous Infusions: . sodium chloride 10 mL/hr at 07/22/20 2952  . acyclovir 750 mg (07/27/20 8413)  . feeding supplement (VITAL AF 1.2 CAL) 1,000 mL (07/26/20 2004)    Assessment & Plan:   Active Problems:   SIRS (systemic inflammatory response syndrome) (HCC)   Shingles rash   Delirium   Acute metabolic encephalopathy   Essential hypertension   Anemia   Acute lower UTI   Chronic diastolic CHF (congestive heart failure) (HCC)   Sepsis (HCC)   Change in mental status   Anorexia    Malnutrition of moderate degree   #1.  Acute metabolic cephalopathy. Less likely bacterial meningitis. S/p LP 9/14:lymphocytic pleocytosis with protein of 222. MRA/MRI no acute changes.  ceftrizone and ampicillin d/c;d Neurology following: EEG: no seizure keppra d/c'd cx neg and HSV DNA neg.  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  9/17 repeat EEG today, will f/u MS mildly better today. Will continue day 15 of 21-day acyclovir    #2.  Shingles with aseptic meningitis. Continue IV acyclovir day 15 of 21  ID following  #3.  Anorexia with poor p.o. intake. On tube feeding will continue  4.  Hyponatremia. Likely due to SIADH, patient does not have volume overload, does not seem to have dehydration.  Her Na levels changes daily, will ask nephrology to see for guidance. Today has improved to Na 128      5.  Sepsis secondary to UTI and viral infection. Improved.   6.  Intra-abdominal fluid collection. CT with fluid collection. Likely pseudocyst., seroma, or biloma, not abscess.  IR did not recommend aspiration    7.  Urinary retention. Continue Foley  8.  Urinary tract infection. Resolved.received ceftriaxone.  9.  Chronic diastolic congestive heart failure. No exaceration Continue to monitor  10. Leg achiness/neuropathy- had this when she came in was started on neurontin, but d/c'd due to MS change.  C/o about this today, daughter wanted me to give her something. Discussed with neurology about reinstituting neurontin and ok'd. Will start Neurontin 100 mg p.o. twice daily I also called daughter and told her about initiation of neurontin but if becomes confused that we may have to stop it again. She verbalizes an understanding.  DVT prophylaxis: lovenox Code Status:full Family Communication:  daughter updated  Status is: Inpatient  Remains inpatient appropriate because: inpatient level of care appropriate due to severity of illness  Dispo: The patient is from: Home              Anticipated d/c is to: SNF              Anticipated d/c date is: >3 days              Patient currently is not medically stable to dc            LOS: 14 days   Time spent: 45% with more than 50% on COC    Lynn ItoSahar Judson Tsan, MD [facility] Pager [phone]  If 7PM-7AM, please contact night-coverage [email or web address] Password [email or web address] 07/27/2020, 1:01 PM

## 2020-07-27 NOTE — Progress Notes (Signed)
RN, Toni Amend, from [facility] called for current VS on pt and if any changes.  [facility] to call us for updates q24hrs.

## 2020-07-28 LAB — GLUCOSE, CAPILLARY
Glucose-Capillary: 139 mg/dL — ABNORMAL HIGH (ref 70–99)
Glucose-Capillary: 142 mg/dL — ABNORMAL HIGH (ref 70–99)
Glucose-Capillary: 168 mg/dL — ABNORMAL HIGH (ref 70–99)
Glucose-Capillary: 168 mg/dL — ABNORMAL HIGH (ref 70–99)
Glucose-Capillary: 176 mg/dL — ABNORMAL HIGH (ref 70–99)
Glucose-Capillary: 183 mg/dL — ABNORMAL HIGH (ref 70–99)
Glucose-Capillary: 185 mg/dL — ABNORMAL HIGH (ref 70–99)

## 2020-07-28 LAB — BASIC METABOLIC PANEL
Anion gap: 9 (ref 5–15)
BUN: 23 mg/dL (ref 8–23)
CO2: 26 mmol/L (ref 22–32)
Calcium: 8.5 mg/dL — ABNORMAL LOW (ref 8.9–10.3)
Chloride: 92 mmol/L — ABNORMAL LOW (ref 98–111)
Creatinine, Ser: 0.62 mg/dL (ref 0.44–1.00)
GFR calc Af Amer: >60 mL/min (ref >60)
GFR calc non Af Amer: >60 mL/min (ref >60)
Glucose, Bld: 178 mg/dL — ABNORMAL HIGH (ref 70–99)
Potassium: 4.3 mmol/L (ref 3.5–5.1)
Sodium: 127 mmol/L — ABNORMAL LOW (ref 135–145)

## 2020-07-28 LAB — CSF CULTURE W GRAM STAIN: Culture: NO GROWTH

## 2020-07-28 LAB — OSMOLALITY: Osmolality: 272 mOsm/kg — ABNORMAL LOW (ref 275–295)

## 2020-07-28 LAB — MISC LABCORP TEST (SEND OUT): Labcorp test code: 138685

## 2020-07-28 LAB — CULTURE, BLOOD (ROUTINE X 2)
Culture: NO GROWTH
Culture: NO GROWTH
Special Requests: ADEQUATE
Special Requests: ADEQUATE

## 2020-07-28 MED ORDER — PANTOPRAZOLE SODIUM 40 MG PO TBEC
40.0000 mg | DELAYED_RELEASE_TABLET | Freq: Two times a day (BID) | ORAL | Status: DC
  Administered 2020-07-28 – 2020-07-30 (×5): 40 mg via ORAL
  Filled 2020-07-28 (×6): qty 1

## 2020-07-28 MED ORDER — ENOXAPARIN SODIUM 40 MG/0.4ML ~~LOC~~ SOLN
40.0000 mg | SUBCUTANEOUS | Status: DC
  Administered 2020-07-28 – 2020-08-24 (×28): 40 mg via SUBCUTANEOUS
  Filled 2020-07-28 (×28): qty 0.4

## 2020-07-28 MED ORDER — THIAMINE HCL 100 MG PO TABS
100.0000 mg | ORAL_TABLET | Freq: Every day | ORAL | Status: DC
  Administered 2020-07-28 – 2020-08-18 (×20): 100 mg via ORAL
  Filled 2020-07-28 (×20): qty 1

## 2020-07-28 NOTE — Plan of Care (Signed)
Continuing with plan of care. 

## 2020-07-28 NOTE — Progress Notes (Signed)
PHARMACIST - PHYSICIAN COMMUNICATION  DR:   Marylu Lund  CONCERNING: IV to Oral Route Change Policy  RECOMMENDATION: This patient is receiving Protonix and Thiamine by the intravenous route.  Based on criteria approved by the Pharmacy and Therapeutics Committee, the intravenous medication(s) is/are being converted to the equivalent oral dose form(s).   DESCRIPTION: These criteria include:  The patient is eating (either orally or via tube) and/or has been taking other orally administered medications for a least 24 hours  The patient has no evidence of active gastrointestinal bleeding or impaired GI absorption (gastrectomy, short bowel, patient on TNA or NPO).  If you have questions about this conversion, please contact the Pharmacy Department  []   [phone] )  ( [phone] [x]   [phone] )  [facility] []   [phone] )  [facility] []   [phone] )  [place] [facility] []   [phone] )  [facility]   ( [phone], [facility] 07/28/2020 7:46 AM

## 2020-07-28 NOTE — Progress Notes (Signed)
[facility]  ROUNDING NOTE   Subjective:   Na 127 Patient is unable to answer questions.   Objective:  Vital signs in last 24 hours:  Temp:  [97.8 F (36.6 C)-99.9 F (37.7 C)] 99.8 F (37.7 C) (09/18 1329) Pulse Rate:  [86-103] 86 (09/18 0742) Resp:  [15-17] 15 (09/18 0742) BP: (133-140)/(59-69) 137/59 (09/18 0742) SpO2:  [99 %-100 %] 99 % (09/18 0742) Weight:  [77.2 kg] 77.2 kg (09/18 0412)  Weight change:  Filed Weights   07/25/20 0500 07/26/20 0410 07/28/20 0412  Weight: 76 kg 75.3 kg 77.2 kg    Intake/Output: I/O last 3 completed shifts: In: 6098.3 [I.V.:38.3; Other:400; NG/GT:5000; IV Piggyback:660] Out: 1700 [Urine:1700]   Intake/Output this shift:  Total I/O In: 769.1 [NG/GT:604.1; IV Piggyback:165] Out: 1550 [Urine:1550]  Physical Exam: General: NAD, laying in bed  Head: NGT   Eyes: Anicteric, PERRL  Neck: Supple, trachea midline  Lungs:  Clear to auscultation  Heart: Regular rate and rhythm  Abdomen:  Soft, nontender,   Extremities:  no peripheral edema.  Neurologic: Not able to answer questions  Skin: No lesions        Basic Metabolic Panel: Recent Labs  Lab 07/22/20 0439 07/23/20 1939 07/24/20 0705 07/24/20 0705 07/24/20 0941 07/24/20 1515 07/25/20 0407 07/25/20 0407 07/26/20 0946 07/27/20 0412 07/28/20 0902  NA 134*   < > 125*  --    < > 127* 130*  --  125* 128* 127*  K 3.8   < > 3.9  --   --   --  3.8  --  3.9 4.0 4.3  CL 103   < > 93*  --   --   --  94*  --  90* 91* 92*  CO2 24   < > 23  --   --   --  26  --  GLUCOSE 145*   < > 175*  --   --   --  126*  --  173* 152* 178*  BUN 12   < > 12  --   --   --  11  --  CREATININE 0.79   < > 0.76  --   --   --  0.61  --  0.62 0.65 0.62  CALCIUM 8.9   < > 8.3*   < >  --   --  8.8*   < > 8.6* 8.6* 8.5*  MG 1.9  --  1.7  --   --   --  1.9  --   --   --   --   PHOS  --   --  3.4  --   --   --  3.9  --   --   --   --    < > = values in this interval not  displayed.    Liver Function Tests: Recent Labs  Lab 07/23/20 1939 07/24/20 1825  AST 27  --   ALT 29  --   ALKPHOS 71  --   BILITOT 0.8  --   PROT 7.0  --   ALBUMIN 3.3* 3.4*   No results for input(s): LIPASE, AMYLASE in the last 168 hours. No results for input(s): AMMONIA in the last 168 hours.  CBC: Recent Labs  Lab 07/22/20 0439 07/23/20 1939 07/24/20 0705 07/25/20 0407  WBC 7.1 9.8 8.6 7.5  NEUTROABS 5.3 7.4 6.8 5.7  HGB 10.1* 11.1* 10.5* 10.3*  HCT 29.6*  31.2* 30.3* 29.8*  MCV 96.4 91.2 94.7 95.5  PLT 290 311 266 284    Cardiac Enzymes: No results for input(s): CKTOTAL, CKMB, CKMBINDEX, TROPONINI in the last 168 hours.  BNP: Invalid input(s): POCBNP  CBG: Recent Labs  Lab 07/27/20 2022 07/28/20 0003 07/28/20 0352 07/28/20 0744 07/28/20 1145  GLUCAP 153* 142* 139* 176* 168*    Microbiology: Results for orders placed or performed during the hospital encounter of 07/12/20  Urine culture     Status: Abnormal   Collection Time: 07/12/20  9:23 PM   Specimen: Urine, Random  Result Value Ref Range Status   Specimen Description   Final    URINE, RANDOM Performed at [facility], [address] [place], [place] [address]    Special Requests   Final    NONE Performed at [facility], [address] [place], [place] [address]    Culture MULTIPLE SPECIES PRESENT, SUGGEST RECOLLECTION (A)  Final   Report Status 07/15/2020 FINAL  Final  Culture, blood (routine x 2)     Status: None   Collection Time: 07/12/20  9:23 PM   Specimen: BLOOD  Result Value Ref Range Status   Specimen Description BLOOD RIGHT AC  Final   Special Requests   Final    BOTTLES DRAWN AEROBIC AND ANAEROBIC Blood Culture results may not be optimal due to an excessive volume of blood received in culture bottles   Culture   Final    NO GROWTH 5 DAYS Performed at [facility], [address] [place], [place] [address]    Report Status 07/17/2020 FINAL   Final  Culture, blood (routine x 2)     Status: None   Collection Time: 07/12/20  9:23 PM   Specimen: BLOOD  Result Value Ref Range Status   Specimen Description BLOOD RIGHT WRIST  Final   Special Requests   Final    BOTTLES DRAWN AEROBIC AND ANAEROBIC Blood Culture adequate volume   Culture   Final    NO GROWTH 5 DAYS Performed at [facility], [address] [place], [place] [address]    Report Status 07/17/2020 FINAL  Final  SARS Coronavirus 2 by RT PCR (hospital order, performed in [facility] hospital lab) Nasopharyngeal Nasopharyngeal Swab     Status: None   Collection Time: 07/12/20 11:13 PM   Specimen: Nasopharyngeal Swab  Result Value Ref Range Status   SARS Coronavirus 2 NEGATIVE NEGATIVE Final    Comment: (NOTE) SARS-CoV-2 target nucleic acids are NOT DETECTED.  The SARS-CoV-2 RNA is generally detectable in upper and lower respiratory specimens during the acute phase of infection. The lowest concentration of SARS-CoV-2 viral copies this assay can detect is 250 copies / mL. A negative result does not preclude SARS-CoV-2 infection and should not be used as the sole basis for treatment or other patient management decisions.  A negative result may occur with improper specimen collection / handling, submission of specimen other than nasopharyngeal swab, presence of viral mutation(s) within the areas targeted by this assay, and inadequate number of viral copies (<250 copies / mL). A negative result must be combined with clinical observations, patient history, and epidemiological information.  Fact Sheet for Patients:   BoilerBrush.com.cy  Fact Sheet for Healthcare Providers: https://pope.com/  This test is not yet approved or  cleared by the [place] FDA and has been authorized for detection and/or diagnosis of SARS-CoV-2 by FDA under an Emergency Use Authorization (EUA).  This EUA will remain  in effect  (meaning this test can be used) for the duration of the COVID-19 declaration under Section 564(b)(1) of the Act, 21 U.S.C. section 360bbb-3(b)(1), unless the authorization is terminated or revoked sooner.  Performed at [facility], [address] [place], [place] [address]   MRSA PCR Screening     Status: None   Collection Time: 07/13/20  2:02 AM   Specimen: Nasal Mucosa; Nasopharyngeal  Result Value Ref Range Status   MRSA by PCR NEGATIVE NEGATIVE Final    Comment:        The GeneXpert MRSA Assay (FDA approved for NASAL specimens only), is one component of a comprehensive MRSA colonization surveillance program. It is not intended to diagnose MRSA infection nor to guide or monitor treatment for MRSA infections. Performed at [facility], [address] [place], [place] [address]   CULTURE, BLOOD (ROUTINE X 2) w Reflex to ID Panel     Status: None   Collection Time: 07/16/20  5:36 PM   Specimen: BLOOD  Result Value Ref Range Status   Specimen Description BLOOD [facility]  Final   Special Requests   Final    BOTTLES DRAWN AEROBIC ONLY Blood Culture adequate volume   Culture   Final    NO GROWTH 5 DAYS Performed at [facility], [address] [place], [place] [address]    Report Status 07/21/2020 FINAL  Final  CULTURE, BLOOD (ROUTINE X 2) w Reflex to ID Panel     Status: None   Collection Time: 07/16/20  5:36 PM   Specimen: BLOOD  Result Value Ref Range Status   Specimen Description BLOOD [facility]  Final   Special Requests   Final    BOTTLES DRAWN AEROBIC AND ANAEROBIC Blood Culture adequate volume   Culture   Final    NO GROWTH 5 DAYS Performed at [facility], [address] [place], [place] [address]    Report Status 07/21/2020 FINAL  Final  CULTURE, BLOOD (ROUTINE X 2) w Reflex to ID Panel     Status: None   Collection Time: 07/23/20  7:46 PM   Specimen: BLOOD  Result Value Ref Range Status   Specimen Description BLOOD LEFT [facility]   Final   Special Requests   Final    BOTTLES DRAWN AEROBIC AND ANAEROBIC Blood Culture adequate volume   Culture   Final    NO GROWTH 5 DAYS Performed at [facility], [address] [place], [place] [address]    Report Status 07/28/2020 FINAL  Final  CULTURE, BLOOD (ROUTINE X 2) w Reflex to ID Panel     Status: None   Collection Time: 07/23/20  7:47 PM   Specimen: BLOOD  Result Value Ref Range Status   Specimen Description BLOOD RIGHT [facility]  Final   Special Requests   Final    BOTTLES DRAWN AEROBIC AND ANAEROBIC Blood Culture adequate volume   Culture   Final    NO GROWTH 5 DAYS Performed at [facility], [address] [place], [place] [address]    Report Status 07/28/2020 FINAL  Final  Culture, fungus without smear     Status: None (Preliminary result)   Collection Time: 07/24/20  1:15 PM   Specimen: CSF; Cerebrospinal Fluid  Result Value Ref Range Status   Specimen Description   Final    CSF Performed at [facility], [address] [place], [place] [address]    Special Requests   Final  Normal Performed at [facility], [address] [place], [place] [address]    Culture   Final    NO FUNGUS ISOLATED AFTER 5 DAYS Performed at [facility], [address] [place], [place] [address]    Report Status PENDING  Incomplete  Anaerobic culture     Status: None (Preliminary result)   Collection Time: 07/24/20  1:15 PM   Specimen: CSF; Cerebrospinal Fluid  Result Value Ref Range Status   Specimen Description   Final    CSF Performed at [facility], [address] [place], [place] [address]    Special Requests   Final    Normal Performed at [facility], [address] [place], [place] [address]    Culture   Final    NO ANAEROBES ISOLATED; CULTURE IN PROGRESS FOR 5 DAYS   Report Status PENDING  Incomplete  CSF culture     Status: None   Collection Time: 07/24/20  1:15 PM   Specimen: PATH Cytology  CSF; Cerebrospinal Fluid  Result Value Ref Range Status   Specimen Description   Final    CSF Performed at [facility], [address] [place], [place] [address]    Special Requests   Final    NONE Performed at [facility], [address] [place], [place] [address]    Gram Stain   Final    WBC SEEN NO ORGANISMS SEEN Performed at [facility], [address] [place], [place] [address]    Culture   Final    NO GROWTH Performed at [facility], [address] [place], [place] [address]    Report Status 07/28/2020 FINAL  Final    Coagulation Studies: No results for input(s): LABPROT, INR in the last 72 hours.  Urinalysis: No results for input(s): COLORURINE, LABSPEC, PHURINE, GLUCOSEU, HGBUR, BILIRUBINUR, KETONESUR, PROTEINUR, UROBILINOGEN, NITRITE, LEUKOCYTESUR in the last 72 hours.  Invalid input(s): APPERANCEUR    Imaging: EEG  Result Date: 07/27/2020 Charlsie QuestYadav, Priyanka O, MD     07/27/2020  2:43 PM Patient Name: Casey GentileBrenda Baldwin MRN: [id number] Epilepsy Attending: Charlsie QuestPriyanka O Yadav Referring Physician/Provider: Dr Erick BlinksSalman Khaliqdina Date: 07/27/2020 Duration: 29.42 mins Patient history: 79yo F with ams. EEG to evaluate for seizure Level of alertness: Awake AEDs during EEG study: Gabapentin Technical aspects: This EEG study was done with scalp electrodes positioned according to the 10-20 International system of electrode placement. Electrical activity was acquired at a sampling rate of 500Hz  and reviewed with a high frequency filter of 70Hz  and a low frequency filter of 1Hz . EEG data were recorded continuously and digitally stored. Description: No posterior dominant rhythm was seen. EEG showed continuous generalized 3 to 6 Hz theta-delta slowing. Photic driving was not seen during photic stimulation. Hyperventilation was not performed.    ABNORMALITY -Continuous slow, generalized  IMPRESSION: This study is suggestive of moderate diffuse encephalopathy,  nonspecific etiology. No seizures or epileptiform discharges were seen throughout the recording.  Priyanka Annabelle Harman Yadav     Medications:   . sodium chloride 10 mL/hr at 07/22/20 10270632  . acyclovir 750 mg (07/28/20 1304)  . feeding supplement (VITAL AF 1.2 CAL) 1,000 mL (07/27/20 1657)   . Chlorhexidine Gluconate Cloth  6 each Topical Daily  . docusate sodium  100 mg Oral BID  . enoxaparin (LOVENOX) injection  40 mg Subcutaneous Q24H  . Ferrous Fumarate  1 tablet Oral BID  . free water  20 mL Per Tube Q4H  .  gabapentin  100 mg Oral BID  . insulin aspart  0-9 Units Subcutaneous Q4H  . lactulose  20 g Oral BID  . metoprolol tartrate  12.5 mg Oral BID  . multivitamin with minerals  1 tablet Per Tube Daily  . pantoprazole  40 mg Oral BID  . rosuvastatin  20 mg Oral Daily  . sodium chloride flush  3 mL Intravenous Q12H  . tamsulosin  0.4 mg Oral QPC supper  . thiamine  100 mg Oral Daily   sodium chloride, acetaminophen **OR** acetaminophen, haloperidol lactate, ondansetron **OR** ondansetron (ZOFRAN) IV  Assessment/ Plan:  Casey Baldwin is a 79 y.o. 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 Hyponatremia, could be due to some element of volume depletion. Encephalitis could also potentially be leading to SIADH. - no indication for tolvaptan - TSH elevated - Do not recommend IV fluids at this time - Continue tube  feeds  2. Hypertension: on tamsulosin and metoprolol.     LOS: 15 Jerrian Mells 9/18/20211:52 PM

## 2020-07-28 NOTE — Progress Notes (Addendum)
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 Rash is intensely pruritic different stages of healing some areas of vesicular be red border. Some areas covered in black eschars. Skin is diffusely tender.Per family (asked by me) pt developed confusion. Was c/o side to back pain.  Sed rate was elevated. LP was considered but patient declined stating that she had a history of meningitis in the past and very scared of having LP done. Hospitalist was called for admission for shingles and possible encephalitis.  9/8.IV acyclovir was changed to Valtrex. Patient has not had a bowel movement since admission, has poor appetite. Giving lactulose, increase the Protonix to twice a day.  9/9.Patient refused CPAP last night, she became more confused today. She also has reversed sleeping cycle. Added Seroquel 25 mg evening. Schedule lactulose twice a day for constipation.  9/10.Patient slept last night with Seroquel. Still very confused and fatigued.Seen by neurologist, suspect metabolic source. Repeated CT scan of the abdomen/pelvis, intra-abdominal fluid collection still present.  9/13.Dobbhoff placed, tube feeding started. Also consult psychiatry for 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  9/14.  Patient mental status not improving.  Had a fever of 102 yesterday evening.  ID restart antibiotics with Rocephin and ampicillin.  LP is performed today LP is performed.  Study consistent with viral  meningitis/encephalitis.  Discussed with infect disease and neurology, neurology concerned for subacute seizure.  MRI of the brain and MRA will be performed.  EEG is also ordered.  However, patient will need a persistent EEG monitoring, initiate as process to transfer patient to a tertiary hospital.  I have started the process with [facility] per family request.  They do not want to transfer to [facility].  They are aware that the transfer process may take several days.  Also started seizure medicine per neurology. Discussed with DR. Veal from [facility] neurology, will arrange for transfer.   9/15: EEG: moderate diffuse encephlopahty, nonspecific. No seizure   9/17 more interactive today. EEG done.  Consultants:   Neurology, ID, interventional radiology  Procedures: Status post LP on 9/14  Antimicrobials:  Ampicillin and Rocephin. Acyclovir 9/11    Subjective: Reports leg achiness is better today. No sob, or other complaints. Does try to talk to me but falls a sleep too.   Objective: Vitals:   07/27/20 2130 07/28/20 0008 07/28/20 0412 07/28/20 0742  BP: 140/67 137/67  (!) 137/59  Pulse: (!) 103 91  86  Resp: Temp: 99.9 F (37.7 C) 99.5 F (37.5 C)  97.8 F (36.6 C)  TempSrc: Oral Oral  Oral  SpO2: 100% 99%  99%  Weight:   77.2 kg   Height:        Intake/Output Summary (Last 24 hours) at 07/28/2020 1308 Last data filed at 07/28/2020 1304 Gross per 24 hour  Intake 6467.38 ml  Output 2000 ml  Net 4467.38 ml   Filed Weights   07/25/20 0500 07/26/20 0410 07/28/20 0412  Weight: 76 kg 75.3 kg 77.2 kg    Examination:  Calm, comfortable, looks much better than yesterday, NAD , opens eyes, and tries to answer some of my questions. CTA no rales rhonchi's or wheezing  Regular S1-S2 no murmurs rubs gallops  Soft nontender nondistended positive bowel sounds  No edema bilaterally  Assess full neuro exam Mood and affect appropriate in current  setting      Data Reviewed: I have personally reviewed following labs and imaging studies  CBC: Recent Labs  Lab 07/22/20 0439 07/23/20 1939 07/24/20 0705 07/25/20 0407  WBC 7.1 9.8 8.6 7.5  NEUTROABS 5.3 7.4 6.8 5.7  HGB 10.1* 11.1* 10.5* 10.3*  HCT 29.6* 31.2* 30.3* 29.8*  MCV 96.4 91.2 94.7 95.5  PLT 290 311 266 284   Basic Metabolic Panel: Recent Labs  Lab 07/22/20 0439 07/23/20 1939 07/24/20 0705 07/24/20 0941 07/24/20 1515 07/25/20 0407 07/26/20 0946 07/27/20 0412 07/28/20 0902  NA 134*   < > 125*   < > 127* 130* 125* 128* 127*  K 3.8   < > 3.9  --   --  3.8 3.9 4.0 4.3  CL 103   < > 93*  --   --  94* 90* 91* 92*  CO2 24   < > 23  --   --  26 26 29 26   GLUCOSE 145*   < > 175*  --   --  126* 173* 152* 178*  BUN 12   < > 12  --   --  11 14 20 23   CREATININE 0.79   < > 0.76  --   --  0.61 0.62 0.65 0.62  CALCIUM 8.9   < > 8.3*  --   --  8.8* 8.6* 8.6* 8.5*  MG 1.9  --  1.7  --   --  1.9  --   --   --   PHOS  --   --  3.4  --   --  3.9  --   --   --    < > = values in this interval not displayed.   GFR: Estimated Creatinine Clearance: 56.4 mL/min (by C-G formula based on SCr of 0.62 mg/dL). Liver Function Tests: Recent Labs  Lab 07/23/20 1939 07/24/20 1825  AST 27  --   ALT 29  --   ALKPHOS 71  --   BILITOT 0.8  --   PROT 7.0  --   ALBUMIN 3.3* 3.4*   No results for input(s): LIPASE, AMYLASE in the last 168 hours. No results for input(s): AMMONIA in the last 168 hours. Coagulation Profile: Recent Labs  Lab 07/24/20 0822  INR 1.1   Cardiac Enzymes: No results for input(s): CKTOTAL, CKMB, CKMBINDEX, TROPONINI in the last 168 hours. BNP (last 3 results) No results for input(s): PROBNP in the last 8760 hours. HbA1C: No results for input(s): HGBA1C in the last 72 hours. CBG: Recent Labs  Lab 07/27/20 2022 07/28/20 0003 07/28/20 0352 07/28/20 0744 07/28/20 1145  GLUCAP 153* 142* 139* 176* 168*   Lipid Profile: No results for input(s):  CHOL, HDL, LDLCALC, TRIG, CHOLHDL, LDLDIRECT in the last 72 hours. Thyroid Function Tests: Recent Labs    07/27/20 0412  TSH 6.562*   Anemia Panel: No results for input(s): VITAMINB12, FOLATE, FERRITIN, TIBC, IRON, RETICCTPCT in the last 72 hours. Sepsis Labs: Recent Labs  Lab 07/22/20 0439 07/24/20 0705 07/25/20 0407  PROCALCITON <0.10 <0.10 <0.10    Recent Results (from the past 240 hour(s))  CULTURE, BLOOD (ROUTINE X 2) w Reflex to ID  Panel     Status: None   Collection Time: 07/23/20  7:46 PM   Specimen: BLOOD  Result Value Ref Range Status   Specimen Description BLOOD LEFT [facility]  Final   Special Requests   Final    BOTTLES DRAWN AEROBIC AND ANAEROBIC Blood Culture adequate volume   Culture   Final    NO GROWTH 5 DAYS Performed at [facility], [address] [place], [place] [address]    Report Status 07/28/2020 FINAL  Final  CULTURE, BLOOD (ROUTINE X 2) w Reflex to ID Panel     Status: None   Collection Time: 07/23/20  7:47 PM   Specimen: BLOOD  Result Value Ref Range Status   Specimen Description BLOOD RIGHT [facility]  Final   Special Requests   Final    BOTTLES DRAWN AEROBIC AND ANAEROBIC Blood Culture adequate volume   Culture   Final    NO GROWTH 5 DAYS Performed at [facility], [address] [place], [place] [address]    Report Status 07/28/2020 FINAL  Final  Culture, fungus without smear     Status: None (Preliminary result)   Collection Time: 07/24/20  1:15 PM   Specimen: CSF; Cerebrospinal Fluid  Result Value Ref Range Status   Specimen Description   Final    CSF Performed at [facility], [address] [place], [place] [address]    Special Requests   Final    Normal Performed at [facility], [address] [place], [place] [address]    Culture   Final    NO FUNGUS ISOLATED AFTER 5 DAYS Performed at [facility], [address] [place], [place] [address]    Report Status PENDING  Incomplete   Anaerobic culture     Status: None (Preliminary result)   Collection Time: 07/24/20  1:15 PM   Specimen: CSF; Cerebrospinal Fluid  Result Value Ref Range Status   Specimen Description   Final    CSF Performed at [facility], [address] [place], [place] [address]    Special Requests   Final    Normal Performed at [facility], [address] [place], [place] [address]    Culture   Final    NO ANAEROBES ISOLATED; CULTURE IN PROGRESS FOR 5 DAYS   Report Status PENDING  Incomplete  CSF culture     Status: None   Collection Time: 07/24/20  1:15 PM   Specimen: PATH Cytology CSF; Cerebrospinal Fluid  Result Value Ref Range Status   Specimen Description   Final    CSF Performed at [facility], [address] [place], [place] [address]    Special Requests   Final    NONE Performed at [facility], [address] [place], [place] [address]    Gram Stain   Final    WBC SEEN NO ORGANISMS SEEN Performed at [facility], [address] [place], [place] [address]    Culture   Final    NO GROWTH Performed at [facility], [address] [place], [place] [address]    Report Status 07/28/2020 FINAL  Final         Radiology Studies: EEG  Result Date: 07/27/2020 Charlsie QuestYadav, Priyanka O, MD     07/27/2020  2:43 PM Patient Name: Casey GentileBrenda Baldwin MRN: [id number] Epilepsy Attending: Charlsie QuestPriyanka O Yadav Referring Physician/Provider: Dr Erick BlinksSalman Khaliqdina Date: 07/27/2020 Duration: 29.42 mins Patient history: 79yo F with ams.  EEG to evaluate for seizure Level of alertness: Awake AEDs during EEG study: Gabapentin Technical aspects: This EEG study was done with scalp electrodes positioned according to the 10-20 International system of electrode placement. Electrical activity was acquired at a sampling rate of 500Hz  and reviewed with a high frequency filter of 70Hz  and a low frequency filter of 1Hz . EEG data were recorded continuously and  digitally stored. Description: No posterior dominant rhythm was seen. EEG showed continuous generalized 3 to 6 Hz theta-delta slowing. Photic driving was not seen during photic stimulation. Hyperventilation was not performed.    ABNORMALITY -Continuous slow, generalized  IMPRESSION: This study is suggestive of moderate diffuse encephalopathy, nonspecific etiology. No seizures or epileptiform discharges were seen throughout the recording.  Priyanka        Scheduled Meds: . Chlorhexidine Gluconate Cloth  6 each Topical Daily  . docusate sodium  100 mg Oral BID  . enoxaparin (LOVENOX) injection  40 mg Subcutaneous Q24H  . Ferrous Fumarate  1 tablet Oral BID  . free water  20 mL Per Tube Q4H  . gabapentin  100 mg Oral BID  . insulin aspart  0-9 Units Subcutaneous Q4H  . lactulose  20 g Oral BID  . metoprolol tartrate  12.5 mg Oral BID  . multivitamin with minerals  1 tablet Per Tube Daily  . pantoprazole  40 mg Oral BID  . rosuvastatin  20 mg Oral Daily  . sodium chloride flush  3 mL Intravenous Q12H  . tamsulosin  0.4 mg Oral QPC supper  . thiamine  100 mg Oral Daily   Continuous Infusions: . sodium chloride 10 mL/hr at 07/22/20  . acyclovir 750 mg (07/28/20 1304)  . feeding supplement (VITAL AF 1.2 CAL) 1,000 mL (07/27/20 1657)    Assessment & Plan:   Active Problems:   SIRS (systemic inflammatory response syndrome) (HCC)   Shingles rash   Delirium   Acute metabolic encephalopathy   Essential hypertension   Anemia   Acute lower UTI   Chronic diastolic CHF (congestive heart failure) (HCC)   Sepsis (HCC)   Change in mental status   Anorexia   Malnutrition of moderate degree   #1.  Acute metabolic cephalopathy. 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 We will continue day 16 of 21-day acyclovir     #2.  Shingles with aseptic meningitis. Continue IV acyclovir day 16 of 21  See above, ID following  #3.  Anorexia with poor p.o. intake. On tube feeding and will continue as she has poor p.o. intake still    4.  Hyponatremia.-Could be due to some element of volume depletion.  Encephalitis could also potentially be leading to SIADH. Nephrology was consulted, input was appreciated.  Recommend continue tube feeding and reevaluating serum sodium.  If sodium stabilizes and or drops more likely has some element of SIADH likely due to her encephalitis.  Tolvaptan him can be considered but not immediately necessary at the moment.  Will check serum osmolality, urine osmolality, TSH,  uric acid level for additional work-up Sodium is 127 today    5.  Sepsis secondary to UTI and viral infection. Improved  6.  Intra-abdominal fluid collection. CT with fluid collection likely pseudocyst, seroma, or biloma, not abscess.  IR did not recommend aspiration    7.  Urinary retention. Continue with Foley  8.  Urinary tract infection. Resolved received ceftriaxone    9.  Chronic diastolic congestive heart failure. No exaceration Continue to monitor  10. Leg achiness/neuropathy- had this when she came in was started on neurontin, but d/c'd due to MS change.   daughter wanted me to give her something. Discussed with neurology about reinstituting neurontin and ok'd. Was started on Neurontin 100 mg p.o. twice daily so far tolerating from a mental status  point.  Will minimize medications that may affect her mental status.    DVT prophylaxis: lovenox Code Status:full Family Communication: daughter updated today  Status is: Inpatient  Remains inpatient appropriate because: inpatient level of care appropriate due to severity of illness  Dispo: The patient is from: Home              Anticipated d/c is to: SNF v.s [facility] (as was requested by daughter last week)              Anticipated d/c date is: >3 days              Patient currently is not medically stable to dc            LOS: 15 days   Time spent: 45% with more than 50% on COC    Lynn Ito, MD [facility] Pager [phone]  If 7PM-7AM, please contact night-coverage [email or web address] Password [email or web address] 07/28/2020, 1:08 PM

## 2020-07-28 NOTE — Progress Notes (Addendum)
NEUROLOGY CONSULTATION PROGRESS NOTE   Date of service: July 28, 2020 Patient Name: Casey Baldwin MRN:  [id number] DOB:  09-Mar-1941  Interval Hx:   Alertness is improved. Repeat EEG was negative for seizures.   Past History   Past Medical History:  Diagnosis Date  . Coronary artery disease   . High cholesterol   . Hypertension    Past Surgical History:  Procedure Laterality Date  . ABDOMINAL HYSTERECTOMY    . COLON RESECTION    . FACIAL COSMETIC SURGERY    . FRACTURE SURGERY     left leg and left ankle.  Marland Kitchen GASTRIC BYPASS    . left knee replacement     Family History  Problem Relation Age of Onset  . Colon cancer Mother    Social History   Socioeconomic History  . Marital status: Widowed    Spouse name: Not on file  . Number of children: Not on file  . Years of education: Not on file  . Highest education level: Not on file  Occupational History  . Not on file  Tobacco Use  . Smoking status: Never Smoker  . Smokeless tobacco: Never Used  Vaping Use  . Vaping Use: Never used  Substance and Sexual Activity  . Alcohol use: No  . Drug use: No  . Sexual activity: Not on file  Other Topics Concern  . Not on file  Social History Narrative  . Not on file   Social Determinants of Health   Financial Resource Strain:   . Difficulty of Paying Living Expenses: Not on file  Food Insecurity:   . Worried About Programme researcher, broadcasting/film/video in the Last Year: Not on file  . Ran Out of Food in the Last Year: Not on file  Transportation Needs:   . Lack of Transportation (Medical): Not on file  . Lack of Transportation (Non-Medical): Not on file  Physical Activity:   . Days of Exercise per Week: Not on file  . Minutes of Exercise per Session: Not on file  Stress:   . Feeling of Stress : Not on file  Social Connections:   . Frequency of Communication with Friends and Family: Not on file  . Frequency of Social Gatherings with Friends and Family: Not on file  . Attends  Religious Services: Not on file  . Active Member of Clubs or Organizations: Not on file  . Attends Banker Meetings: Not on file  . Marital Status: Not on file   Allergies  Allergen Reactions  . Lidocaine Anaphylaxis and Other (See Comments)    Became unconscious with administration for dental procedure at age 79yo. Pt tolerated subsequent lidocaine.    . Amlodipine Other (See Comments)    Hand and leg cramping  . Lactose Diarrhea  . Lactose Intolerance (Gi)   . Latex     Medications   Medications Prior to Admission  Medication Sig Dispense Refill Last Dose  . Acetaminophen 500 MG capsule Take 500 mg by mouth daily as needed.      . Artificial Tear Ointment (DRY EYES OP) Place 1-2 drops into both eyes daily as needed.     Marland Kitchen aspirin 81 MG EC tablet Take 81 mg by mouth daily.      . bisacodyl (DULCOLAX) 5 MG EC tablet Take 10 mg by mouth at bedtime.      . calcium carbonate (OS-CAL) 1250 (500 Ca) MG chewable tablet Chew 2 tablets by mouth daily.     Marland Kitchen  Cholecalciferol (VITAMIN D PO) Take 1 tablet by mouth daily.     . furosemide (LASIX) 20 MG tablet Take 20-40 mg by mouth daily. CAN TAKE 40MG  FOR SWELLING     . isosorbide mononitrate (IMDUR) 30 MG 24 hr tablet Take 30 mg by mouth daily.     . Multiple Vitamin (MULTIVITAMIN WITH MINERALS) TABS tablet Take 1 tablet by mouth daily.     . Multiple Vitamins-Minerals (ICAPS AREDS 2 PO) Take 1 capsule by mouth daily.     . ondansetron (ZOFRAN) 4 MG tablet Take 4 mg by mouth daily as needed.     . pantoprazole (PROTONIX) 20 MG tablet Take 20 mg by mouth daily.     . rosuvastatin (CRESTOR) 20 MG tablet Take 20 mg by mouth daily.     senna-docusate (SENOKOT-S) 8.6-50 MG tablet Take 2 tablets by mouth 2 (two) times daily. (Patient not taking: Reported on 04/01/2020) 120 tablet 0   . witch hazel-glycerin (TUCKS) pad Apply 1 application topically as needed for itching. (Patient not taking: Reported on 07/10/2020) 40 each 12       Vitals  Temp:  [97.8 F (36.6 C)-99.9 F (37.7 C)] 99.8 F (37.7 C) (09/18 1329) Pulse Rate:  [86-103] 86 (09/18 0742) Resp:  [15-17] 15 (09/18 0742) BP: (133-140)/(59-69) 137/59 (09/18 0742) SpO2:  [99 %-100 %] 99 % (09/18 0742) Weight:  [77.2 kg] 77.2 kg (09/18 0412)  Body mass index is 29.23 kg/m.  Physical Exam    Neurologic Examination  Mental status/Cognition: Opens eyes spontaneously, tracks my face across the room, smiles and is oriented to self but not place. Follows commands in both arms and legs. Reports pain is improved.  Speech/language: No dysarthria noted. Cranial nerves:   CN II Pupils equal and reactive to light, blinks to threat and tracks face.   CN III,IV,VI No gaze preference, EOMI intact to dolls eyes   CN V    CN VII Symmetric facial smile   CN VIII    CN IX & X    CN XI    CN XII    Motor:  Muscle bulk: normal, tone normal. Follows commands in all extremities. Reflexes:  Right Left Comments  Pectoralis      Biceps (C5/6) 2 2   Brachioradialis (C5/6) 2 2    Triceps (C6/7) 2 2    Patellar (L3/4) 2 2    Achilles (S1) 1 1    Hoffman      Plantar     Jaw jerk     Sensation:  Light touch Regards touch in all extremities.   Pin prick    Temperature    Vibration   Proprioception    Coordination/Complex Motor:  No tremor with movements.  Labs   Lab Results  Component Value Date   NA 127 (L) 07/28/2020   K 4.3 07/28/2020   CL 92 (L) 07/28/2020   CO2 26 07/28/2020   GLUCOSE 178 (H) 07/28/2020   BUN 23 07/28/2020   CREATININE 0.62 07/28/2020   CALCIUM 8.5 (L) 07/28/2020   ALBUMIN 3.4 (L) 07/24/2020   AST 27 07/23/2020   ALT 29 07/23/2020   ALKPHOS 71 07/23/2020   BILITOT 0.8 07/23/2020   GFRNONAA >60 07/28/2020   GFRAA >60 07/28/2020     Imaging and Diagnostic studies  MRI Brain with and without contrast on 07/24/20: 1. Stable MRI appearance of the brain since 07/13/2020, with no acute or  inflammatory process identified. 2. Mild for age  nonspecific white matter signal changes, most commonly due to chronic small vessel disease. 3. Minor paranasal sinus mucosal thickening with right nasoenteric tube in place.  MRA head without contrast on 07/24/20: 1. Intracranial MRA degraded by motion, such that the circle-of-Willis branch detail is degraded in the anterior circulation. There is no evidence of a large vessel vasculitis. 2. No large vessel occlusion or significant arterial stenosis identified. There is generalized arterial tortuosity.  rEEG on 9/16: This study is suggestive of moderate diffuse encephalopathy, nonspecific etiology. No seizures or epileptiform discharges were seen throughout the recording.  rEEG on 9/17: This study is suggestive of moderate diffuse encephalopathy, nonspecific etiology.No seizures or epileptiform discharges were seen throughout the recording.   Impression   Casey Baldwin is a 79 y.o. 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 Initially had intact mentation and refused LP. MRI brain with no hyperintensity, no contrast enhancement and therefore thought to be delirium. She had worsening mentation with daily fevers despite Acyclovir so we were asked to assess her again. LP with CSF lymphoytic pleocytosis and elevated protein, normal glucose. Multple CSF studies.  Neuro exam has shown consistent improvement over the last couple of days. Initial concern was for subclinical seizure. However with negative rEEG x2 for seizures and consistent improvement in exam, suspect that this is probably delirium with improvement on delirium precautions.   Recommendations  - I think we can hold off on transfer. She has had definite improvement in mentation over the last few days. - Multiple CSF  cultures prelim negative, full Cx pending. - CSF Autoimmune encephalitis panel is negative. - CSF HSV PCR neg, Enterovirus PCR neg, [place] CSF IgG is positive but IgM is negative, [place] serum IgM and IgG are both negative. Likely false positive in the CSF. - We will cotninue to follow along.  ______________________________________________________________________   Thank you for the opportunity to take part in the care of this patient. If you have any further questions, please contact the neurology consultation attending.  Signed,  Erick Blinks [facility] Pager Number [phone]

## 2020-07-29 LAB — ANAEROBIC CULTURE: Special Requests: NORMAL

## 2020-07-29 LAB — GLUCOSE, CAPILLARY
Glucose-Capillary: 176 mg/dL — ABNORMAL HIGH (ref 70–99)
Glucose-Capillary: 177 mg/dL — ABNORMAL HIGH (ref 70–99)
Glucose-Capillary: 193 mg/dL — ABNORMAL HIGH (ref 70–99)
Glucose-Capillary: 188 mg/dL — ABNORMAL HIGH (ref 70–99)
Glucose-Capillary: 188 mg/dL — ABNORMAL HIGH (ref 70–99)

## 2020-07-29 LAB — SODIUM: Sodium: 126 mmol/L — ABNORMAL LOW (ref 135–145)

## 2020-07-29 LAB — COPPER, SERUM: Copper: 106 ug/dL (ref 80–158)

## 2020-07-29 NOTE — Treatment Plan (Signed)
Attempted to get patient up to chair, pt refused. Did not want to be touch when educated about need to get out of bed. Will attempt again later this shift.

## 2020-07-29 NOTE — Care Plan (Signed)
Pt ate 1/4 banana for this Clinical research associate and drank tea at this time.

## 2020-07-29 NOTE — Progress Notes (Addendum)
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 Rash is intensely pruritic different stages of healing some areas of vesicular be red border. Some areas covered in black eschars. Skin is diffusely tender.Per family (asked by me) pt developed confusion. Was c/o side to back pain.  Sed rate was elevated. LP was considered but patient declined stating that she had a history of meningitis in the past and very scared of having LP done. Hospitalist was called for admission for shingles and possible encephalitis.  9/8.IV acyclovir was changed to Valtrex. Patient has not had a bowel movement since admission, has poor appetite. Giving lactulose, increase the Protonix to twice a day.  9/9.Patient refused CPAP last night, she became more confused today. She also has reversed sleeping cycle. Added Seroquel 25 mg evening. Schedule lactulose twice a day for constipation.  9/10.Patient slept last night with Seroquel. Still very confused and fatigued.Seen by neurologist, suspect metabolic source. Repeated CT scan of the abdomen/pelvis, intra-abdominal fluid collection still present.  9/13.Dobbhoff placed, tube feeding started. Also consult psychiatry for 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  9/14.  Patient mental status not improving.  Had a fever of 102 yesterday evening.  ID restart antibiotics with Rocephin and ampicillin.  LP is performed today LP is performed.  Study consistent with viral  meningitis/encephalitis.  Discussed with infect disease and neurology, neurology concerned for subacute seizure.  MRI of the brain and MRA will be performed.  EEG is also ordered.  However, patient will need a persistent EEG monitoring, initiate as process to transfer patient to a tertiary hospital.  I have started the process with [facility] per family request.  They do not want to transfer to [facility].  They are aware that the transfer process may take several days.  Also started seizure medicine per neurology. Discussed with DR. Veal from [facility] neurology, will arrange for transfer.   9/15: EEG: moderate diffuse encephlopahty, nonspecific. No seizure   9/17 more interactive today. EEG done.  9/19- pt more awake and alert, responding more to my questions.    Consultants:   Neurology, ID, interventional radiology  Procedures: Status post LP on 9/14  Antimicrobials:  Ampicillin and Rocephin. Acyclovir 9/11    Subjective: Daughter at bedside.  Patient more awake and alert.  She is actually smiling.  She tries to answer some of my questions.   Objective: Vitals:   07/28/20 2119 07/28/20 2356 07/29/20 0444 07/29/20 0756  BP: (!) 147/66 (!) 149/75  139/79  Pulse: 93 91  95  Resp: Temp: 99.6 F (37.6 C) 99.6 F (37.6 C)  98.4 F (36.9 C)  TempSrc: Oral Oral  Oral  SpO2: 99% 100%  99%  Weight:   76.2 kg   Height:        Intake/Output Summary (Last 24 hours) at 07/29/2020 0831 Last data filed at 07/29/2020 0400 Gross per 24 hour  Intake 2379 ml  Output 2200 ml  Net 179 ml   Filed Weights   07/26/20 0410 07/28/20  2202 07/29/20 0444  Weight: 75.3 kg 77.2 kg 76.2 kg    Examination: Laying up in bed, tube feeding in place, calm comfortable smiling at me nods and tries to talk, definitely more awake and alert Clear to auscultation, no R/R/W Regular S1-S2 no murmurs rubs gallops Soft nontender nondistended positive bowel sounds No edema bilaterally  SCDs in place Awake and alert to person, date, but did not answer about place, appears weak Mood and affect improving and appropriate in current setting    Data Reviewed: I have personally reviewed following labs and imaging studies  CBC: Recent Labs  Lab 07/23/20 1939 07/24/20 0705 07/25/20 0407  WBC 9.8 8.6 7.5  NEUTROABS 7.4 6.8 5.7  HGB 11.1* 10.5* 10.3*  HCT 31.2* 30.3* 29.8*  MCV 91.2 94.7 95.5  PLT 311 266 284   Basic Metabolic Panel: Recent Labs  Lab 07/24/20 0705 07/24/20 0941 07/25/20 0407 07/26/20 0946 07/27/20 0412 07/28/20 0902 07/29/20 0313  NA 125*   < > 130* 125* 128* 127* 126*  K 3.9  --  3.8 3.9 4.0 4.3  --   CL 93*  --  94* 90* 91* 92*  --   CO2 23  --  26 26 29 26   --   GLUCOSE 175*  --  126* 173* 152* 178*  --   BUN 12  --  11 14 20 23   --   CREATININE 0.76  --  0.61 0.62 0.65 0.62  --   CALCIUM 8.3*  --  8.8* 8.6* 8.6* 8.5*  --   MG 1.7  --  1.9  --   --   --   --   PHOS 3.4  --  3.9  --   --   --   --    < > = values in this interval not displayed.   GFR: Estimated Creatinine Clearance: 56 mL/min (by C-G formula based on SCr of 0.62 mg/dL). Liver Function Tests: Recent Labs  Lab 07/23/20 1939 07/24/20 1825  AST 27  --   ALT 29  --   ALKPHOS 71  --   BILITOT 0.8  --   PROT 7.0  --   ALBUMIN 3.3* 3.4*   No results for input(s): LIPASE, AMYLASE in the last 168 hours. No results for input(s): AMMONIA in the last 168 hours. Coagulation Profile: Recent Labs  Lab 07/24/20 0822  INR 1.1   Cardiac Enzymes: No results for input(s): CKTOTAL, CKMB, CKMBINDEX, TROPONINI in the last 168 hours. BNP (last 3 results) No results for input(s): PROBNP in the last 8760 hours. HbA1C: No results for input(s): HGBA1C in the last 72 hours. CBG: Recent Labs  Lab 07/28/20 1145 07/28/20 1614 07/28/20 2029 07/28/20 2357 07/29/20 0350  GLUCAP 168* 185* 183* 168* 176*   Lipid Profile: No results for input(s): CHOL, HDL, LDLCALC, TRIG, CHOLHDL,  LDLDIRECT in the last 72 hours. Thyroid Function Tests: Recent Labs    07/27/20 0412  TSH 6.562*   Anemia Panel: No results for input(s): VITAMINB12, FOLATE, FERRITIN, TIBC, IRON, RETICCTPCT in the last 72 hours. Sepsis Labs: Recent Labs  Lab 07/24/20 0705 07/25/20 0407  PROCALCITON <0.10 <0.10    Recent Results (from the past 240 hour(s))  CULTURE, BLOOD (ROUTINE X 2) w Reflex to ID Panel     Status: None   Collection Time: 07/23/20  7:46 PM   Specimen: BLOOD  Result Value Ref Range Status   Specimen Description BLOOD LEFT [facility]  Final   Special Requests  Final    BOTTLES DRAWN AEROBIC AND ANAEROBIC Blood Culture adequate volume   Culture   Final    NO GROWTH 5 DAYS Performed at [facility], [address] [place], [place] [address]    Report Status 07/28/2020 FINAL  Final  CULTURE, BLOOD (ROUTINE X 2) w Reflex to ID Panel     Status: None   Collection Time: 07/23/20  7:47 PM   Specimen: BLOOD  Result Value Ref Range Status   Specimen Description BLOOD RIGHT [facility]  Final   Special Requests   Final    BOTTLES DRAWN AEROBIC AND ANAEROBIC Blood Culture adequate volume   Culture   Final    NO GROWTH 5 DAYS Performed at [facility], [address] [place], [place] [address]    Report Status 07/28/2020 FINAL  Final  Culture, fungus without smear     Status: None (Preliminary result)   Collection Time: 07/24/20  1:15 PM   Specimen: CSF; Cerebrospinal Fluid  Result Value Ref Range Status   Specimen Description   Final    CSF Performed at [facility], [address] [place], [place] [address]    Special Requests   Final    Normal Performed at [facility], [address] [place], [place] [address]    Culture   Final    NO FUNGUS ISOLATED AFTER 5 DAYS Performed at [facility], [address] [place], [place] [address]    Report Status PENDING  Incomplete  Anaerobic culture     Status: None (Preliminary result)    Collection Time: 07/24/20  1:15 PM   Specimen: CSF; Cerebrospinal Fluid  Result Value Ref Range Status   Specimen Description   Final    CSF Performed at [facility], [address] [place], [place] [address]    Special Requests   Final    Normal Performed at [facility], [address] [place], [place] [address]    Culture   Final    NO ANAEROBES ISOLATED; CULTURE IN PROGRESS FOR 5 DAYS   Report Status PENDING  Incomplete  CSF culture     Status: None   Collection Time: 07/24/20  1:15 PM   Specimen: PATH Cytology CSF; Cerebrospinal Fluid  Result Value Ref Range Status   Specimen Description   Final    CSF Performed at [facility], [address] [place], [place] [address]    Special Requests   Final    NONE Performed at [facility], [address] [place], [place] [address]    Gram Stain   Final    WBC SEEN NO ORGANISMS SEEN Performed at [facility], [address] [place], [place] [address]    Culture   Final    NO GROWTH Performed at [facility], [address] [place], [place] [address]    Report Status 07/28/2020 FINAL  Final         Radiology Studies: EEG  Result Date: 07/27/2020 Charlsie QuestYadav, Priyanka O, MD     07/27/2020  2:43 PM Patient Name: Casey Baldwin MRN: [id number] Epilepsy Attending: Charlsie QuestPriyanka O Yadav Referring Physician/Provider: Dr Erick BlinksSalman Khaliqdina Date: 07/27/2020 Duration: 29.42 mins Patient history: 79yo F with ams. EEG to evaluate for seizure Level of alertness: Awake AEDs during EEG study: Gabapentin Technical aspects: This EEG study was done with scalp electrodes positioned according to the 10-20 International system of electrode placement. Electrical activity was acquired at a  sampling rate of 500Hz  and reviewed with a high frequency filter of 70Hz  and a low frequency filter of 1Hz . EEG data were recorded continuously and digitally stored. Description: No posterior dominant rhythm was seen.  EEG showed continuous generalized 3 to 6 Hz theta-delta slowing. Photic driving was not seen during photic stimulation. Hyperventilation was not performed.    ABNORMALITY -Continuous slow, generalized  IMPRESSION: This study is suggestive of moderate diffuse encephalopathy, nonspecific etiology. No seizures or epileptiform discharges were seen throughout the recording.  Priyanka        Scheduled Meds: . Chlorhexidine Gluconate Cloth  6 each Topical Daily  . docusate sodium  100 mg Oral BID  . enoxaparin (LOVENOX) injection  40 mg Subcutaneous Q24H  . Ferrous Fumarate  1 tablet Oral BID  . free water  20 mL Per Tube Q4H  . gabapentin  100 mg Oral BID  . insulin aspart  0-9 Units Subcutaneous Q4H  . lactulose  20 g Oral BID  . metoprolol tartrate  12.5 mg Oral BID  . multivitamin with minerals  1 tablet Per Tube Daily  . pantoprazole  40 mg Oral BID  . rosuvastatin  20 mg Oral Daily  . sodium chloride flush  3 mL Intravenous Q12H  . tamsulosin  0.4 mg Oral QPC supper  . thiamine  100 mg Oral Daily   Continuous Infusions: . sodium chloride 10 mL/hr at 07/22/20  . acyclovir 750 mg (07/29/20 0515)  . feeding supplement (VITAL AF 1.2 CAL) 60 mL/hr at 07/29/20 0400    Assessment & Plan:   Active Problems:   SIRS (systemic inflammatory response syndrome) (HCC)   Shingles rash   Delirium   Acute metabolic encephalopathy   Essential hypertension   Anemia   Acute lower UTI   Chronic diastolic CHF (congestive heart failure) (HCC)   Sepsis (HCC)   Change in mental status   Anorexia   Malnutrition of moderate degree   #1.  Acute metabolic cephalopathy. Less likely bacterial meningitis  Status post LP on 9/14: Lymphocytic pleocytosis with protein of 222 MRA/MRI no acute changes Ceftrizone and ampicillin DC'd  Neurology following-EEG initially no seizure...> Keppra was discontinued Repeat EEG on 9/17 moderate diffuse encephalopathy, nonspecific etiology.No  seizures or epileptiform discharges  CX negative and HSV DNA negative 9/19-CSF autoimmune encephalitis panel is negative CSF HSV PCR negative, enterovirus PCR negative, West Nile CSF IgG is positive but IgM is negative, West Nile serum IgM and IgG are both negative.  Per neurology likely false positive in the CSF. 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 We will continue acyclovir day 17 of 21 Daughter still wants patient to be transferred to [facility] if a bed becomes available Discussed with nursing trying to get patient out of bed to chair     #2.  Shingles with aseptic meningitis. Continue IV acyclovir day 17 of 21  See above, ID following    #3.  Anorexia with poor p.o. intake. On tube feeding, will continue as her intake is poor.  Daughter does try to feed her at bedside     4.  Hyponatremia.-Could be due to some element of volume depletion.  Encephalitis could also potentially be leading to SIADH. Nephrology was consulted, input was appreciated.  Recommend continue tube feeding and reevaluating serum sodium.  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  Sepsis secondary to UTI and viral infection. Improved  6.  Intra-abdominal fluid collection. CT with fluid collection likely pseudocyst, seroma, or biloma, not abscess.   IR did not recommend aspiration     7.  Urinary retention. Continue with  Foley for now  8.  Urinary tract infection. Resolved received ceftriaxone   9.  Chronic diastolic congestive heart failure. No exaceration Continue to monitor  10. Leg achiness/neuropathy- had this when she came in was started on neurontin, but d/c'd due to MS change.   daughter wanted me to give her something. Discussed with neurology about reinstituting neurontin and ok'd. Was started on Neurontin 100 mg p.o. twice daily so far tolerating from a mental status point.  Will minimize medications that may affect her mental status.   9/19-her sx improved with neurontin  DVT prophylaxis: lovenox Code Status:full Family Communication: daughter updated today at bedside  Status is: Inpatient  Remains inpatient appropriate because: inpatient level of care appropriate due to severity of illness  Dispo: The patient is from: Home              Anticipated d/c is to: SNF v.s [facility] (as was requested by daughter last week)              Anticipated d/c date is: >3 days              Patient currently is not medically stable to dc            LOS: 16 days   Time spent: with more than 50% on COC    Lynn Ito, MD [facility] Pager [phone]  If 7PM-7AM, please contact night-coverage [email or web address] Password [facility] 07/29/2020, 8:31 AM

## 2020-07-29 NOTE — Care Plan (Signed)
Pt did well this shift. Pt had small BM during her bath. Pt was able to drink from a straw and ate 5 to 10% of meals. Daughter visited with patient this am and pm. Pt FSBS stayed elevated and covered with insulin. Left arm IV was changed to 22 R lateral forearm. Patient dressing to sacrum changed this shift. Patient likes meds crushed in orange sherbert. Pt able to bend legs, raise arms, squeeze writers hands. Pt continues to keep head bent to the left and will not allow writer or tech change her position, states it feels stiff. Tylenol was given for pain this shift.

## 2020-07-29 NOTE — Progress Notes (Signed)
[facility]  ROUNDING NOTE   Subjective:   Na 126 Patient mental status improved.   Objective:  Vital signs in last 24 hours:  Temp:  [98.4 F (36.9 C)-99.6 F (37.6 C)] 98.4 F (36.9 C) (09/19 0756) Pulse Rate:  [91-95] 95 (09/19 0756) Resp:  [15-17] 16 (09/19 0756) BP: (139-149)/(66-79) 139/79 (09/19 0756) SpO2:  [99 %-100 %] 99 % (09/19 0756) Weight:  [76.2 kg] 76.2 kg (09/19 0444)  Weight change: -1.089 kg Filed Weights   07/26/20 0410 07/28/20 0412 07/29/20 0444  Weight: 75.3 kg 77.2 kg 76.2 kg    Intake/Output: I/O last 3 completed shifts: In: 3563 [P.O.:50; I.V.:29; ZO/XW:9604G/GT:2824; IV Piggyback:660] Out: 2650 [Urine:2650]   Intake/Output this shift:  Total I/O In: 140 [P.O.:80; NG/GT:60] Out: 300 [Urine:300]  Physical Exam: General: NAD, laying in bed  Head: NGT   Eyes: Anicteric, PERRL  Neck: Supple, trachea midline  Lungs:  Clear to auscultation  Heart: Regular rate and rhythm  Abdomen:  Soft, nontender,   Extremities:  no peripheral edema.  Neurologic: Not able to answer questions  Skin: No lesions        Basic Metabolic Panel: Recent Labs  Lab 07/24/20 0705 07/24/20 0941 07/25/20 0407 07/25/20 0407 07/26/20 0946 07/27/20 0412 07/28/20 0902 07/29/20 0313  NA 125*   < > 130*  --  125* 128* 127* 126*  K 3.9  --  3.8  --  3.9 4.0 4.3  --   CL 93*  --  94*  --  90* 91* 92*  --   CO2 23  --  26  --  26 29 26   --   GLUCOSE 175*  --  126*  --  173* 152* 178*  --   BUN 12  --  11  --  14 20 23   --   CREATININE 0.76  --  0.61  --  0.62 0.65 0.62  --   CALCIUM 8.3*   < > 8.8*   < > 8.6* 8.6* 8.5*  --   MG 1.7  --  1.9  --   --   --   --   --   PHOS 3.4  --  3.9  --   --   --   --   --    < > = values in this interval not displayed.    Liver Function Tests: Recent Labs  Lab 07/23/20 1939 07/24/20 1825  AST 27  --   ALT 29  --   ALKPHOS 71  --   BILITOT 0.8  --   PROT 7.0  --   ALBUMIN 3.3* 3.4*   No results for input(s):  LIPASE, AMYLASE in the last 168 hours. No results for input(s): AMMONIA in the last 168 hours.  CBC: Recent Labs  Lab 07/23/20 1939 07/24/20 0705 07/25/20 0407  WBC 9.8 8.6 7.5  NEUTROABS 7.4 6.8 5.7  HGB 11.1* 10.5* 10.3*  HCT 31.2* 30.3* 29.8*  MCV 91.2 94.7 95.5  PLT 311 266 284    Cardiac Enzymes: No results for input(s): CKTOTAL, CKMB, CKMBINDEX, TROPONINI in the last 168 hours.  BNP: Invalid input(s): POCBNP  CBG: Recent Labs  Lab 07/28/20 2029 07/28/20 2357 07/29/20 0350 07/29/20 0759 07/29/20 1154  GLUCAP 183* 168* 176* 177* 193*    Microbiology: Results for orders placed or performed during the hospital encounter of 07/12/20  Urine culture     Status: Abnormal   Collection Time: 07/12/20  9:23 PM  Specimen: Urine, Random  Result Value Ref Range Status   Specimen Description   Final    URINE, RANDOM Performed at [facility], [address] [place], [place] [address]    Special Requests   Final    NONE Performed at [facility], [address] [place], [place] [address]    Culture MULTIPLE SPECIES PRESENT, SUGGEST RECOLLECTION (A)  Final   Report Status 07/15/2020 FINAL  Final  Culture, blood (routine x 2)     Status: None   Collection Time: 07/12/20  9:23 PM   Specimen: BLOOD  Result Value Ref Range Status   Specimen Description BLOOD RIGHT AC  Final   Special Requests   Final    BOTTLES DRAWN AEROBIC AND ANAEROBIC Blood Culture results may not be optimal due to an excessive volume of blood received in culture bottles   Culture   Final    NO GROWTH 5 DAYS Performed at [facility], [address] [place], [place] [address]    Report Status 07/17/2020 FINAL  Final  Culture, blood (routine x 2)     Status: None   Collection Time: 07/12/20  9:23 PM   Specimen: BLOOD  Result Value Ref Range Status   Specimen Description BLOOD RIGHT WRIST  Final   Special Requests   Final    BOTTLES DRAWN AEROBIC AND ANAEROBIC  Blood Culture adequate volume   Culture   Final    NO GROWTH 5 DAYS Performed at [facility], [address], [place], [place] [address]    Report Status 07/17/2020 FINAL  Final  SARS Coronavirus 2 by RT PCR (hospital order, performed in [facility] hospital lab) Nasopharyngeal Nasopharyngeal Swab     Status: None   Collection Time: 07/12/20 11:13 PM   Specimen: Nasopharyngeal Swab  Result Value Ref Range Status   SARS Coronavirus 2 NEGATIVE NEGATIVE Final    Comment: (NOTE) SARS-CoV-2 target nucleic acids are NOT DETECTED.  The SARS-CoV-2 RNA is generally detectable in upper and lower respiratory specimens during the acute phase of infection. The lowest concentration of SARS-CoV-2 viral copies this assay can detect is 250 copies / mL. A negative result does not preclude SARS-CoV-2 infection and should not be used as the sole basis for treatment or other patient management decisions.  A negative result may occur with improper specimen collection / handling, submission of specimen other than nasopharyngeal swab, presence of viral mutation(s) within the areas targeted by this assay, and inadequate number of viral copies (<250 copies / mL). A negative result must be combined with clinical observations, patient history, and epidemiological information.  Fact Sheet for Patients:   BoilerBrush.com.cy  Fact Sheet for Healthcare Providers: https://pope.com/  This test is not yet approved or  cleared by the [place] FDA and has been authorized for detection and/or diagnosis of SARS-CoV-2 by FDA under an Emergency Use Authorization (EUA).  This EUA will remain in effect (meaning this test can be used) for the duration of the COVID-19 declaration under Section 564(b)(1) of the Act, 21 U.S.C. section 360bbb-3(b)(1), unless the authorization is terminated or revoked sooner.  Performed at [facility], [address] [place], [place] [address]   MRSA PCR Screening     Status: None   Collection Time: 07/13/20  2:02 AM   Specimen: Nasal Mucosa; Nasopharyngeal  Result Value Ref Range Status   MRSA by PCR NEGATIVE NEGATIVE Final    Comment:  The GeneXpert MRSA Assay (FDA approved for NASAL specimens only), is one component of a comprehensive MRSA colonization surveillance program. It is not intended to diagnose MRSA infection nor to guide or monitor treatment for MRSA infections. Performed at [facility], [address] [place], [place] [address]   CULTURE, BLOOD (ROUTINE X 2) w Reflex to ID Panel     Status: None   Collection Time: 07/16/20  5:36 PM   Specimen: BLOOD  Result Value Ref Range Status   Specimen Description BLOOD [facility]  Final   Special Requests   Final    BOTTLES DRAWN AEROBIC ONLY Blood Culture adequate volume   Culture   Final    NO GROWTH 5 DAYS Performed at [facility], [address] [place], [place] [address]    Report Status 07/21/2020 FINAL  Final  CULTURE, BLOOD (ROUTINE X 2) w Reflex to ID Panel     Status: None   Collection Time: 07/16/20  5:36 PM   Specimen: BLOOD  Result Value Ref Range Status   Specimen Description BLOOD [facility]  Final   Special Requests   Final    BOTTLES DRAWN AEROBIC AND ANAEROBIC Blood Culture adequate volume   Culture   Final    NO GROWTH 5 DAYS Performed at [facility], [address], [place], [place] [address]    Report Status 07/21/2020 FINAL  Final  CULTURE, BLOOD (ROUTINE X 2) w Reflex to ID Panel     Status: None   Collection Time: 07/23/20  7:46 PM   Specimen: BLOOD  Result Value Ref Range Status   Specimen Description BLOOD LEFT [facility]  Final   Special Requests   Final    BOTTLES DRAWN AEROBIC AND ANAEROBIC Blood Culture adequate volume   Culture   Final    NO GROWTH 5 DAYS Performed at [facility], [address] [place], [place] [address]    Report Status 07/28/2020 FINAL   Final  CULTURE, BLOOD (ROUTINE X 2) w Reflex to ID Panel     Status: None   Collection Time: 07/23/20  7:47 PM   Specimen: BLOOD  Result Value Ref Range Status   Specimen Description BLOOD RIGHT [facility]  Final   Special Requests   Final    BOTTLES DRAWN AEROBIC AND ANAEROBIC Blood Culture adequate volume   Culture   Final    NO GROWTH 5 DAYS Performed at [facility], [address] [place], [place] [address]    Report Status 07/28/2020 FINAL  Final  Culture, fungus without smear     Status: None (Preliminary result)   Collection Time: 07/24/20  1:15 PM   Specimen: CSF; Cerebrospinal Fluid  Result Value Ref Range Status   Specimen Description   Final    CSF Performed at [facility], [address], [place], [place] [address]    Special Requests   Final    Normal Performed at [facility], [address] [place], [place] [address]    Culture   Final    NO FUNGUS ISOLATED AFTER 5 DAYS Performed at [facility], [address], [place], [place] [address]    Report Status PENDING  Incomplete  Anaerobic culture     Status: None   Collection Time: 07/24/20  1:15 PM   Specimen: CSF; Cerebrospinal Fluid  Result Value Ref Range Status   Specimen Description   Final    CSF Performed at [facility], [address] [place], [place] [address]    Special Requests   Final    Normal Performed at [facility], [address] [place], [place] [address]    Culture   Final    NO ANAEROBES ISOLATED Performed at [facility], [address], [place], [place] [address]    Report Status 07/29/2020 FINAL  Final  CSF culture     Status: None   Collection Time: 07/24/20  1:15 PM   Specimen: PATH Cytology CSF; Cerebrospinal Fluid  Result Value Ref Range Status   Specimen Description   Final    CSF Performed at [facility], [address], [place], [place] [address]    Special Requests   Final    NONE Performed at  [facility], [address] [place], [place] [address]    Gram Stain   Final    WBC SEEN NO ORGANISMS SEEN Performed at [facility], [address], [place], [place] [address]    Culture   Final    NO GROWTH Performed at [facility], [address], [place], [place] [address]    Report Status 07/28/2020 FINAL  Final    Coagulation Studies: No results for input(s): LABPROT, INR in the last 72 hours.  Urinalysis: No results for input(s): COLORURINE, LABSPEC, PHURINE, GLUCOSEU, HGBUR, BILIRUBINUR, KETONESUR, PROTEINUR, UROBILINOGEN, NITRITE, LEUKOCYTESUR in the last 72 hours.  Invalid input(s): APPERANCEUR    Imaging: EEG  Result Date: 07/27/2020 Charlsie Quest, MD     07/27/2020  2:43 PM Patient Name: Casey Baldwin MRN: [id number] Epilepsy Attending: Charlsie Quest Referring Physician/Provider: Dr Erick Blinks Date: 07/27/2020 Duration: 29.42 mins Patient history: 79yo F with ams. EEG to evaluate for seizure Level of alertness: Awake AEDs during EEG study: Gabapentin Technical aspects: This EEG study was done with scalp electrodes positioned according to the 10-20 International system of electrode placement. Electrical activity was acquired at a sampling rate of 500Hz  and reviewed with a high frequency filter of 70Hz  and a low frequency filter of 1Hz . EEG data were recorded continuously and digitally stored. Description: No posterior dominant rhythm was seen. EEG showed continuous generalized 3 to 6 Hz theta-delta slowing. Photic driving was not seen during photic stimulation. Hyperventilation was not performed.    ABNORMALITY -Continuous slow, generalized  IMPRESSION: This study is suggestive of moderate diffuse encephalopathy, nonspecific etiology. No seizures or epileptiform discharges were seen throughout the recording.  Priyanka     Medications:    sodium chloride 10 mL/hr at 07/22/20   acyclovir 750 mg (07/29/20 1325)    feeding supplement (VITAL AF 1.2 CAL) 1,000 mL (07/29/20 1005)    Chlorhexidine Gluconate Cloth  6 each Topical Daily   docusate sodium  100 mg Oral BID   enoxaparin (LOVENOX) injection  40 mg Subcutaneous Q24H   Ferrous Fumarate  1 tablet Oral BID   free water  20 mL Per Tube Q4H   gabapentin  100 mg Oral BID   insulin aspart  0-9 Units Subcutaneous Q4H   lactulose  20 g Oral BID   metoprolol tartrate  12.5 mg Oral BID   multivitamin with minerals  1 tablet Per Tube Daily   pantoprazole  40 mg Oral BID   rosuvastatin  20 mg Oral Daily   sodium chloride flush  3 mL Intravenous Q12H   tamsulosin  0.4 mg Oral QPC supper   thiamine  100 mg Oral Daily   sodium  chloride, acetaminophen **OR** acetaminophen, haloperidol lactate, ondansetron **OR** ondansetron (ZOFRAN) IV  Assessment/ Plan:  Casey Baldwin is a 79 y.o. 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 Hyponatremia, could be due to some element of volume depletion. Encephalitis could also potentially be leading to SIADH. - no indication for tolvaptan - TSH elevated - Discontinue free water flushes for now.  - Continue tube feeds  2. Hypertension: on tamsulosin and metoprolol.     LOS: 16 Casey Baldwin 9/19/20212:19 PM

## 2020-07-30 DIAGNOSIS — R079 Chest pain, unspecified: Secondary | ICD-10-CM | POA: Diagnosis not present

## 2020-07-30 DIAGNOSIS — R519 Headache, unspecified: Secondary | ICD-10-CM | POA: Diagnosis not present

## 2020-07-30 DIAGNOSIS — B029 Zoster without complications: Secondary | ICD-10-CM | POA: Diagnosis not present

## 2020-07-30 DIAGNOSIS — R509 Fever, unspecified: Secondary | ICD-10-CM | POA: Diagnosis not present

## 2020-07-30 DIAGNOSIS — R339 Retention of urine, unspecified: Secondary | ICD-10-CM

## 2020-07-30 LAB — OLIGOCLONAL BANDS, CSF + SERM

## 2020-07-30 LAB — BASIC METABOLIC PANEL
Anion gap: 10 (ref 5–15)
BUN: 23 mg/dL (ref 8–23)
CO2: 25 mmol/L (ref 22–32)
Calcium: 8.7 mg/dL — ABNORMAL LOW (ref 8.9–10.3)
Chloride: 91 mmol/L — ABNORMAL LOW (ref 98–111)
Creatinine, Ser: 0.47 mg/dL (ref 0.44–1.00)
GFR calc Af Amer: >60 mL/min (ref >60)
GFR calc non Af Amer: >60 mL/min (ref >60)
Glucose, Bld: 184 mg/dL — ABNORMAL HIGH (ref 70–99)
Potassium: 4.6 mmol/L (ref 3.5–5.1)
Sodium: 126 mmol/L — ABNORMAL LOW (ref 135–145)

## 2020-07-30 LAB — CBC
HCT: 27.6 % — ABNORMAL LOW (ref 36.0–46.0)
Hemoglobin: 9.2 g/dL — ABNORMAL LOW (ref 12.0–15.0)
MCH: 32.3 pg (ref 26.0–34.0)
MCHC: 33.3 g/dL (ref 30.0–36.0)
MCV: 96.8 fL (ref 80.0–100.0)
Platelets: 278 10*3/uL (ref 150–400)
RBC: 2.85 MIL/uL — ABNORMAL LOW (ref 3.87–5.11)
RDW: 13.1 % (ref 11.5–15.5)
WBC: 8.8 10*3/uL (ref 4.0–10.5)
nRBC: 0 % (ref 0.0–0.2)

## 2020-07-30 LAB — GLUCOSE, CAPILLARY
Glucose-Capillary: 157 mg/dL — ABNORMAL HIGH (ref 70–99)
Glucose-Capillary: 170 mg/dL — ABNORMAL HIGH (ref 70–99)
Glucose-Capillary: 181 mg/dL — ABNORMAL HIGH (ref 70–99)
Glucose-Capillary: 185 mg/dL — ABNORMAL HIGH (ref 70–99)
Glucose-Capillary: 187 mg/dL — ABNORMAL HIGH (ref 70–99)
Glucose-Capillary: 200 mg/dL — ABNORMAL HIGH (ref 70–99)

## 2020-07-30 MED ORDER — DOCUSATE SODIUM 50 MG/5ML PO LIQD
100.0000 mg | Freq: Two times a day (BID) | ORAL | Status: DC
  Administered 2020-07-30 – 2020-08-14 (×25): 100 mg
  Filled 2020-07-30 (×32): qty 10

## 2020-07-30 MED ORDER — DOCUSATE SODIUM 50 MG/5ML PO LIQD
100.0000 mg | Freq: Two times a day (BID) | ORAL | Status: DC
  Filled 2020-07-30: qty 10

## 2020-07-30 MED ORDER — METOPROLOL TARTRATE 25 MG/10 ML ORAL SUSPENSION
12.5000 mg | Freq: Two times a day (BID) | ORAL | Status: DC
  Administered 2020-07-30 – 2020-08-20 (×39): 12.5 mg
  Filled 2020-07-30 (×39): qty 5

## 2020-07-30 MED ORDER — FREE WATER
20.0000 mL | Freq: Four times a day (QID) | Status: DC
  Administered 2020-07-30 – 2020-08-20 (×79): 20 mL

## 2020-07-30 MED ORDER — SODIUM CHLORIDE 0.9 % IV SOLN: INTRAVENOUS | Status: AC

## 2020-07-30 MED ORDER — KATE FARMS STANDARD 1.4 PO LIQD
325.0000 mL | Freq: Two times a day (BID) | ORAL | Status: DC
  Administered 2020-07-30 – 2020-08-07 (×8): 325 mL via ORAL
  Filled 2020-07-30 (×2): qty 325

## 2020-07-30 MED ORDER — ZINC OXIDE 40 % EX OINT
TOPICAL_OINTMENT | CUTANEOUS | Status: DC | PRN
  Filled 2020-07-30: qty 113

## 2020-07-30 MED ORDER — METOPROLOL TARTRATE 25 MG/10 ML ORAL SUSPENSION
12.5000 mg | Freq: Two times a day (BID) | ORAL | Status: DC
  Filled 2020-07-30: qty 5

## 2020-07-30 NOTE — Care Management Important Message (Signed)
Important Message  Patient Details  Name: Casey Baldwin MRN: [id number] Date of Birth: 01-Jun-1941   Medicare Important Message Given:  Other (see comment)  Pending transfer to [facility].    [name] 07/30/2020, 8:37 AM

## 2020-07-30 NOTE — Progress Notes (Addendum)
ID Still lethargic Intermittently verbal Confused  Patient Vitals for the past 24 hrs:  BP Temp Temp src Pulse Resp SpO2 Weight  07/30/20 1631 (!) 149/75 98.9 F (37.2 C) Oral 92 16 100 % --  07/30/20 0748 137/64 98.7 F (37.1 C) Oral 99 17 100 % --  07/30/20 0500 -- -- -- -- -- -- 78.1 kg  07/29/20 2029 (!) 154/79 98.9 F (37.2 C) Oral 94 16 100 % --    On calling her name she opens her eyes and tracks, smiles, and follows simple commands like lifting her arms  Hs s1s2 And soft NG tube Foley'  Labs CBC Latest Ref Rng & Units 07/30/2020 07/25/2020 07/24/2020  WBC 4.0 - 10.5 K/uL 8.8 7.5 8.6  Hemoglobin 12.0 - 15.0 g/dL 2.5(O) 10.3(L) 10.5(L)  Hematocrit 36 - 46 % 27.6(L) 29.8(L) 30.3(L)  Platelets 150 - 400 K/uL 278 284 266    CMP Latest Ref Rng & Units 07/30/2020 07/29/2020 07/28/2020  Glucose 70 - 99 mg/dL 037(C) - 488(Q)  BUN 8 - 23 mg/dL 23 - 23  Creatinine 9.16 - 1.00 mg/dL 9.45 - 0.38  Sodium 882 - 145 mmol/L 126(L) 126(L) 127(L)  Potassium 3.5 - 5.1 mmol/L 4.6 - 4.3  Chloride 98 - 111 mmol/L 91(L) - 92(L)  CO2 22 - 32 mmol/L 25 - 26  Calcium 8.9 - 10.3 mg/dL 8.0(K) - 8.5(L)  Total Protein 6.5 - 8.1 g/dL - - -  Total Bilirubin 0.3 - 1.2 mg/dL - - -  Alkaline Phos 38 - 126 U/L - - -  AST 15 - 41 U/L - - -  ALT 0 - 44 U/L - - -    ID  CSF- HSV, enterovirus neg VZV pcr Negative WNV IgG positive in csf, IgM neg Serum WNV IgG and IgM neg Lyme pending Nmdar neg  CSF culture NG so far -72hrs  BC -9/13, 9/6, 9/2 negative  Imaging MRI/MRA 9/14- negative MRI with contrast 9/2 negative   EEG -9/15- no seizures or epileptiform discharges  Impression/recommendation Admitted on 9/12 with chest pain rt side, fever, headache and leg pain and apparently intermittent confusion as per daughter Found to have herpes zoster Thoracic dermatome  Herpes zoster with  aseptic meningitis on admission ( patient had refused LP)- was started on IV acyclovir 10mg /Kg q  8. She was alert and verbal and appropriate on presentation but after 5-6 days gradual deterioration to obtundation. Seen by neuro- delirium was questioned -EEG on 07/20/20 no seizure activity..There were many reasons including changes in sleep rhythm, poor intake, constipation, not wearing CPAp ( but no co2 narcosis) hyponatremia for the obtundation On 9/14  Had  LP which revealed lymphocytic pleocytosis So far bacterial culture neg and HSV DNA neg- VZV neg .west Nile virus IgG in csf was positive but not IgM and serum antibodies neg- so this is not acute WNV and  IgG is not a reliable  test for diagnosis of acute infection as there is cross reactivity with other flavi virus and dengue-     MRI and MRA done on 9/14 no acute changes. Daughter wanted her to be in 10/14, but no bed and hence Repeat EEG done on 07/25/20 no seizure activity noted. 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 VZV PCR neg in csf which is the case  in 60% of patients- asked lab to add IgG, IgM to the csf- If enough specimen available it will be done at [facility] lab   more alert and more appropriate response but not back to baseline   Day 18 of acyclovir. Will continue for a total of 21 days- 08/02/20  She has  hyponatremia ? SIADH- now followed by nephrologist  Urinary retention over the weekend -9/12- has foley    Rt sided abdominal firmness and fullness CT abdomen showsFluid collection at lies along the posterior margin of the pancreas, predominantly collecting along the right anterior pararenal fascia. This may reflect an abscess. It could be a pseudocyst if there is a history of pancreatitis. It may be a postoperative collection, since the patient underwent a cholecystectomy since the prior CT. Collection  measures approximately 14 x 4 x 10 cm in size.  repeat CT show the same  collection which is thought to be either a pseudocyst, seroma or biloma and not abscess IR does not recommend aspiration.  Complicated stay at [facility] between 04/01/20 until 05/14/20 for abdominal pain which was diagnosed as choledocholithiasis/cholangitis in a setting of prior RYGB in 2008. 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 and she was taken to [facility] 04/05/20 for diagnostic lap, robotic lysis of adhesions, transgastric ERCP and biliary sphincterotomy, cholecystectomy, partial gastrectomy andaccidentalcolotomy which was repaired.The immediate post op was complicated by post ERCP pancreatitis , pulmonary edema with acute hypoxic resp failure , Afib ( treated with amio and metoprolol), fever and leucocytosis suspected due to pancreatitis and was treated with zosyn, UTI due to proteus and acute on chronic anemia needing PRBC and AKI.She was seen by gerontologist for insomnia and risk for delirium    H/o left TKA  Hearing loss  Discussed the management with care team

## 2020-07-30 NOTE — Progress Notes (Signed)
Nutrition Follow-up  DOCUMENTATION CODES:   Non-severe (moderate) malnutrition in context of chronic illness  INTERVENTION:  Continue Vital AF 1.2 Cal at 60 mL/hr (1440 mL goal daily volume). Goal regimen provides 1728 kcal, 108 grams of protein, 1166 mL H2O daily.  Provide free water flush of 20 mL Q6hrs.  Provide D[place] Standard 1.4 Vanilla po BID, each supplement provides 455 kcal and 20 grams of protein.  Once PO intake improves consider calorie count to assist in weaning of tube feeds.  NUTRITION DIAGNOSIS:   Moderate Malnutrition related to chronic illness (cholecystitis s/p LOA, transgastric ERCP and biliary sphincterotomy, cholecystectomy, partial gastrectomy, and colotomy 5/27, hx Roux-en-Y gastric bypass, CHF) as evidenced by mild fat depletion, mild muscle depletion, moderate muscle depletion, percent weight loss.  Ongoing.  GOAL:   Patient will meet greater than or equal to 90% of their needs  Met with TF regimen.  MONITOR:   PO intake, Labs, Weight trends, TF tolerance, I & O's  REASON FOR ASSESSMENT:   Consult Enteral/tube feeding initiation and management  ASSESSMENT:   79 y/o female with h/o CHF, HLD, HTN, OSA, DM, Roux-en-y 2010, Lap band 2008, cholecystitis s/p (diagnostic laparoscopy, robotic lysis of adhesions, transgastric ERCP and biliary sphincterotomy, cholecystectomy, partial gastrectomy and colotomy 5/27) and colon resection for diverticulitis who is admitted with UTI, SIRS, shingles and encephalitis  9/10 per IR no indication for drain placement for stable pancreatic pseudocyst 9/11 NGT placed but plan was to try to adv and wait until AM for repeat x-ray 9/12 TFs started at 20 mL/hr but tube later dislodged; tube was advanced, repeat x-ray taken and TFs resumed at 20 mL/hr 9/12 feeding tube became clogged 9/13 s/p placement of 10 Fr. Dobbhoff tube under fluoroscopic guidance that terminates in proximal portion of gastric remnant (could not be  advanced further) 9/14 plan for LP  Met with patient and daughter at bedside. Patient slightly more alert than at previous assessment but was still not able to answer any questions. Daughter reports that yesterday patient was more awake and she was able to have 1/2 banana and some bites of peas and potatoes. Daughter reports patient was not awake enough this morning to try any breakfast. She reports patient would likely be able to drink oral nutrition supplements now when she is awake enough. Patient does not like Ensure/Boost. After discussing options available on formulary daughter believes patient would like [employer]. Discussed option of cycling tube feeds to give patient time off tube feeds during the day to possibly stimulate appetite. However daughter is concerned about pt being able to tolerate increased volume of cyclic tube feeds and requests to continue 24-hour tube feeds for now.  Medications reviewed and include: Colace 100 mg BID, ferrous fumarate 1 tablet BID, Novolog 0-9 units Q4hrs, lactulose 20 grams BID, MVI daily, Protonix, thiamine 100 mg daily, acyclovir.  Labs reviewed: CBG 157-187, Sodium 126, Chloride 91. Copper 106 (WNL) on 9/14.  Enteral Access: 10 Fr. DHT placed in IR on 9/13  TF regimen: Vital AF 1.2 Cal at 60 mL/hr. Noted free water flush was discontinued  Weight trend: 78.1 kg on 9/20; -1.3 kg from 9/2  I/O: 1000 mL UOP yesteerday (0.5 mL/kg/hr)  Discussed with Nephrology via secure chat. Okay to provide free water flush of 20 mL Q6hrs to maintain tube patency.  Discussed with RN.  Diet Order:   Diet Order            DIET SOFT Room service appropriate? Yes;  Fluid consistency: Thin  Diet effective now                EDUCATION NEEDS:   No education needs have been identified at this time  Skin:  Skin Assessment: Reviewed RN Assessment (ecchymosis)  Last BM:  9/10-type 7 s/p lacutlose enema  Height:   Ht Readings from Last 1 Encounters:  07/12/20  '5\' 4"'  (1.626 m)   Weight:   Wt Readings from Last 1 Encounters:  07/30/20 78.1 kg   Ideal Body Weight:  54.5 kg  BMI:  Body mass index is 29.56 kg/m.  Estimated Nutritional Needs:   Kcal:  1700-1900kcal/day  Protein:  90-105 grams  Fluid:  1.6L/day  Jacklynn Barnacle, MS, RD, LDN Pager number available on Amion

## 2020-07-30 NOTE — Progress Notes (Addendum)
[facility]  ROUNDING NOTE   Subjective:  Patient found lying in bed, appears lethargic, attempts to open eyes to call. Daughter at the bedside. Na+ stays low at 126.   Objective:  Vital signs in last 24 hours:  Temp:  [98.7 F (37.1 C)-99 F (37.2 C)] 98.7 F (37.1 C) (09/20 0748) Pulse Rate:  [94-99] 99 (09/20 0748) Resp:  [16-17] 17 (09/20 0748) BP: (137-154)/(64-79) 137/64 (09/20 0748) SpO2:  [100 %] 100 % (09/20 0748) Weight:  [78.1 kg] 78.1 kg (09/20 0500)  Weight change: 1.95 kg Filed Weights   07/28/20 0412 07/29/20 0444 07/30/20 0500  Weight: 77.2 kg 76.2 kg 78.1 kg    Intake/Output: I/O last 3 completed shifts: In: 3414.1 [P.O.:180; I.V.:20; NG/GT:2554; IV Piggyback:660.1] Out: 2400 [Urine:1400; Emesis/NG output:1000]   Intake/Output this shift:  No intake/output data recorded.  Physical Exam: General: Lying in bed, lethargic, drowsy  Head: Normocephalic,Atraumatic,NG tube in place with tube feeds 60 ml/hr  Eyes:  Eyes closed,trying to open to call, Sclerae and conjunctivae clear  Neck:  Trachea at the midline  Lungs:  Respiration even, unlabored, Lungs clear bilaterally  Heart: S1S2 +, no rubs or gallop  Abdomen:  Non distended, nontender,   Extremities:  No peripheral edema.  Neurologic: Drowsy, lethargic, opens eyes to call  Skin: No acute rashes or lesions        Basic Metabolic Panel: Recent Labs  Lab 07/24/20 0705 07/24/20 0941 07/25/20 0407 07/25/20 0407 07/26/20 0946 07/26/20 0946 07/27/20 0412 07/28/20 0902 07/29/20 0313 07/30/20 0434  NA 125*   < > 130*   < > 125*  --  128* 127* 126* 126*  K 3.9   < > 3.8  --  3.9  --  4.0 4.3  --  4.6  CL 93*   < > 94*  --  90*  --  91* 92*  --  91*  CO2 23   < > 26  --  26  --  29 26  --  25  GLUCOSE 175*   < > 126*  --  173*  --  152* 178*  --  184*  BUN 12   < > 11  --  14  --  20 23  --  23  CREATININE 0.76   < > 0.61  --  0.62  --  0.65 0.62  --  0.47  CALCIUM 8.3*   < > 8.8*    < > 8.6*   < > 8.6* 8.5*  --  8.7*  MG 1.7  --  1.9  --   --   --   --   --   --   --   PHOS 3.4  --  3.9  --   --   --   --   --   --   --    < > = values in this interval not displayed.    Liver Function Tests: Recent Labs  Lab 07/23/20 1939 07/24/20 1825  AST 27  --   ALT 29  --   ALKPHOS 71  --   BILITOT 0.8  --   PROT 7.0  --   ALBUMIN 3.3* 3.4*   No results for input(s): LIPASE, AMYLASE in the last 168 hours. No results for input(s): AMMONIA in the last 168 hours.  CBC: Recent Labs  Lab 07/23/20 1939 07/24/20 0705 07/25/20 0407 07/30/20 0434  WBC 9.8 8.6 7.5 8.8  NEUTROABS 7.4 6.8 5.7  --  HGB 11.1* 10.5* 10.3* 9.2*  HCT 31.2* 30.3* 29.8* 27.6*  MCV 91.2 94.7 95.5 96.8  PLT 311 266 284 278    Cardiac Enzymes: No results for input(s): CKTOTAL, CKMB, CKMBINDEX, TROPONINI in the last 168 hours.  BNP: Invalid input(s): POCBNP  CBG: Recent Labs  Lab 07/29/20 2023 07/30/20 0027 07/30/20 0344 07/30/20 0749 07/30/20 1143  GLUCAP 188* 157* 187* 181* 185*    Microbiology: Results for orders placed or performed during the hospital encounter of 07/12/20  Urine culture     Status: Abnormal   Collection Time: 07/12/20  9:23 PM   Specimen: Urine, Random  Result Value Ref Range Status   Specimen Description   Final    URINE, RANDOM Performed at [facility], [address] [place], [place] [address]    Special Requests   Final    NONE Performed at [facility], [address] [place], [place] [address]    Culture MULTIPLE SPECIES PRESENT, SUGGEST RECOLLECTION (A)  Final   Report Status 07/15/2020 FINAL  Final  Culture, blood (routine x 2)     Status: None   Collection Time: 07/12/20  9:23 PM   Specimen: BLOOD  Result Value Ref Range Status   Specimen Description BLOOD RIGHT AC  Final   Special Requests   Final    BOTTLES DRAWN AEROBIC AND ANAEROBIC Blood Culture results may not be optimal due to an excessive volume of blood  received in culture bottles   Culture   Final    NO GROWTH 5 DAYS Performed at [facility], [address] [place], [place] [address]    Report Status 07/17/2020 FINAL  Final  Culture, blood (routine x 2)     Status: None   Collection Time: 07/12/20  9:23 PM   Specimen: BLOOD  Result Value Ref Range Status   Specimen Description BLOOD RIGHT WRIST  Final   Special Requests   Final    BOTTLES DRAWN AEROBIC AND ANAEROBIC Blood Culture adequate volume   Culture   Final    NO GROWTH 5 DAYS Performed at [facility], [address], [place], [place] [address]    Report Status 07/17/2020 FINAL  Final  SARS Coronavirus 2 by RT PCR (hospital order, performed in [facility] hospital lab) Nasopharyngeal Nasopharyngeal Swab     Status: None   Collection Time: 07/12/20 11:13 PM   Specimen: Nasopharyngeal Swab  Result Value Ref Range Status   SARS Coronavirus 2 NEGATIVE NEGATIVE Final    Comment: (NOTE) SARS-CoV-2 target nucleic acids are NOT DETECTED.  The SARS-CoV-2 RNA is generally detectable in upper and lower respiratory specimens during the acute phase of infection. The lowest concentration of SARS-CoV-2 viral copies this assay can detect is 250 copies / mL. A negative result does not preclude SARS-CoV-2 infection and should not be used as the sole basis for treatment or other patient management decisions.  A negative result may occur with improper specimen collection / handling, submission of specimen other than nasopharyngeal swab, presence of viral mutation(s) within the areas targeted by this assay, and inadequate number of viral copies (<250 copies / mL). A negative result must be combined with clinical observations, patient history, and epidemiological information.  Fact Sheet for Patients:   BoilerBrush.com.cy  Fact Sheet for Healthcare Providers: https://pope.com/  This test is not yet approved or   cleared by the [place] FDA and has been authorized for detection and/or diagnosis of SARS-CoV-2 by FDA under an  Emergency Use Authorization (EUA).  This EUA will remain in effect (meaning this test can be used) for the duration of the COVID-19 declaration under Section 564(b)(1) of the Act, 21 U.S.C. section 360bbb-3(b)(1), unless the authorization is terminated or revoked sooner.  Performed at [facility], [address] [place], [place] [address]   MRSA PCR Screening     Status: None   Collection Time: 07/13/20  2:02 AM   Specimen: Nasal Mucosa; Nasopharyngeal  Result Value Ref Range Status   MRSA by PCR NEGATIVE NEGATIVE Final    Comment:        The GeneXpert MRSA Assay (FDA approved for NASAL specimens only), is one component of a comprehensive MRSA colonization surveillance program. It is not intended to diagnose MRSA infection nor to guide or monitor treatment for MRSA infections. Performed at [facility], [address] [place], [place] [address]   CULTURE, BLOOD (ROUTINE X 2) w Reflex to ID Panel     Status: None   Collection Time: 07/16/20  5:36 PM   Specimen: BLOOD  Result Value Ref Range Status   Specimen Description BLOOD [facility]  Final   Special Requests   Final    BOTTLES DRAWN AEROBIC ONLY Blood Culture adequate volume   Culture   Final    NO GROWTH 5 DAYS Performed at [facility], [address] [place], [place] [address]    Report Status 07/21/2020 FINAL  Final  CULTURE, BLOOD (ROUTINE X 2) w Reflex to ID Panel     Status: None   Collection Time: 07/16/20  5:36 PM   Specimen: BLOOD  Result Value Ref Range Status   Specimen Description BLOOD [facility]  Final   Special Requests   Final    BOTTLES DRAWN AEROBIC AND ANAEROBIC Blood Culture adequate volume   Culture   Final    NO GROWTH 5 DAYS Performed at [facility], [address] [place], [place] [address]    Report Status 07/21/2020 FINAL  Final  CULTURE,  BLOOD (ROUTINE X 2) w Reflex to ID Panel     Status: None   Collection Time: 07/23/20  7:46 PM   Specimen: BLOOD  Result Value Ref Range Status   Specimen Description BLOOD LEFT [facility]  Final   Special Requests   Final    BOTTLES DRAWN AEROBIC AND ANAEROBIC Blood Culture adequate volume   Culture   Final    NO GROWTH 5 DAYS Performed at [facility], [address] [place], [place] [address]    Report Status 07/28/2020 FINAL  Final  CULTURE, BLOOD (ROUTINE X 2) w Reflex to ID Panel     Status: None   Collection Time: 07/23/20  7:47 PM   Specimen: BLOOD  Result Value Ref Range Status   Specimen Description BLOOD RIGHT [facility]  Final   Special Requests   Final    BOTTLES DRAWN AEROBIC AND ANAEROBIC Blood Culture adequate volume   Culture   Final    NO GROWTH 5 DAYS Performed at [facility], [address], [place], [place] [address]    Report Status 07/28/2020 FINAL  Final  Culture, fungus without smear     Status: None (Preliminary result)   Collection Time: 07/24/20  1:15 PM   Specimen: CSF; Cerebrospinal Fluid  Result Value Ref Range Status   Specimen Description   Final    CSF Performed at [facility], [address], [place], [place] [address]  Special Requests   Final    Normal Performed at [facility], [address], [place], [place] [address]    Culture   Final    NO FUNGUS ISOLATED AFTER 5 DAYS Performed at [facility], [address], [place], [place] [address]    Report Status PENDING  Incomplete  Anaerobic culture     Status: None   Collection Time: 07/24/20  1:15 PM   Specimen: CSF; Cerebrospinal Fluid  Result Value Ref Range Status   Specimen Description   Final    CSF Performed at [facility], [address], [place], [place] [address]    Special Requests   Final    Normal Performed at [facility], [address] [place], [place] [address]    Culture   Final    NO ANAEROBES  ISOLATED Performed at [facility], [address] [place], [place] [address]    Report Status 07/29/2020 FINAL  Final  CSF culture     Status: None   Collection Time: 07/24/20  1:15 PM   Specimen: PATH Cytology CSF; Cerebrospinal Fluid  Result Value Ref Range Status   Specimen Description   Final    CSF Performed at [facility], [address] [place], [place] [address]    Special Requests   Final    NONE Performed at [facility], [address], [place], [place] [address]    Gram Stain   Final    WBC SEEN NO ORGANISMS SEEN Performed at [facility], [address] [place], [place] [address]    Culture   Final    NO GROWTH Performed at [facility], [address] [place], [place] [address]    Report Status 07/28/2020 FINAL  Final    Coagulation Studies: No results for input(s): LABPROT, INR in the last 72 hours.  Urinalysis: No results for input(s): COLORURINE, LABSPEC, PHURINE, GLUCOSEU, HGBUR, BILIRUBINUR, KETONESUR, PROTEINUR, UROBILINOGEN, NITRITE, LEUKOCYTESUR in the last 72 hours.  Invalid input(s): APPERANCEUR    Imaging: No results found.   Medications:   . sodium chloride 10 mL/hr at 07/30/20 0400  . sodium chloride 100 mL/hr at 07/30/20 1133  . acyclovir 750 mg (07/30/20 0615)  . feeding supplement (VITAL AF 1.2 CAL) 60 mL/hr at 07/30/20 0400   . Chlorhexidine Gluconate Cloth  6 each Topical Daily  . docusate sodium  100 mg Oral BID  . enoxaparin (LOVENOX) injection  40 mg Subcutaneous Q24H  . Ferrous Fumarate  1 tablet Oral BID  . free water  20 mL Per Tube Q6H  . insulin aspart  0-9 Units Subcutaneous Q4H  . lactulose  20 g Oral BID  . metoprolol tartrate  12.5 mg Oral BID  . multivitamin with minerals  1 tablet Per Tube Daily  . pantoprazole  40 mg Oral BID  . rosuvastatin  20 mg Oral Daily  . sodium chloride flush  3 mL Intravenous Q12H  . tamsulosin  0.4 mg Oral QPC supper  . thiamine  100 mg  Oral Daily   sodium chloride, acetaminophen **OR** acetaminophen, haloperidol lactate, ondansetron **OR** ondansetron (ZOFRAN) IV  Assessment/ Plan:  Ms. Casey Baldwin is a 79 y.o. 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, diabetes mellitus type 2, morbid obesity who was admitted to [facility] on 07/12/2020 for Transient alteration of awareness [R40.4] RUQ pain [R10.11] Abnormal CT scan [  R93.89] Change in mental status [R41.82] SIRS (systemic inflammatory response syndrome) (HCC) [R65.10] Fever, unspecified fever cause [R50.9] Urinary tract infection with hematuria, site unspecified [N39.0, R31.9] Edema of abdominal wall [R60.0]  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 Stable MRI appearance of the brain since 07/13/2020, with no acute or inflammatory process identified. 2. Mild for age nonspecific white matter signal changes, most commonly due to chronic small vessel disease. 3. Minor paranasal sinus mucosal thickening with right nasoenteric tube in place. MRA Head  1. Intracranial MRA degraded by motion, such that the circle-of-Willis branch detail is degraded in the anterior circulation. There is no evidence of a large vessel vasculitis. 2. No large vessel occlusion or significant arterial stenosis identified. There is generalized arterial tortuosity.   LOS: 17 Casey Baldwin 9/20/202111:49 AM  Patient was seen and examined with Casey HaggardPrincy Raju DNP. Above plan was discussed and agreed upon on the signing of this note.   Casey DowdySarath Ikaika Showers, MD [facility]  9/20/20213:26 PM

## 2020-07-30 NOTE — Progress Notes (Signed)
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 Rash is intensely pruritic different stages of healing some areas of vesicular be red border. Some areas covered in black eschars. Skin is diffusely tender.Per family (asked by me) pt developed confusion. Was c/o side to back pain.  Sed rate was elevated. LP was considered but patient declined stating that she had a history of meningitis in the past and very scared of having LP done. Hospitalist was called for admission for shingles and possible encephalitis.  9/8.IV acyclovir was changed to Valtrex. Patient has not had a bowel movement since admission, has poor appetite. Giving lactulose, increase the Protonix to twice a day.  9/9.Patient refused CPAP last night, she became more confused today. She also has reversed sleeping cycle. Added Seroquel 25 mg evening. Schedule lactulose twice a day for constipation.  9/10.Patient slept last night with Seroquel. Still very confused and fatigued.Seen by neurologist, suspect metabolic source. Repeated CT scan of the abdomen/pelvis, intra-abdominal fluid collection still present.  9/13.Dobbhoff placed, tube feeding started. Also consult psychiatry for 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  9/14.  Patient mental status not improving.  Had a fever of 102 yesterday evening.  ID restart antibiotics with Rocephin and ampicillin.  LP is performed today LP is performed.  Study consistent with viral  meningitis/encephalitis.  Discussed with infect disease and neurology, neurology concerned for subacute seizure.  MRI of the brain and MRA will be performed.  EEG is also ordered.  However, patient will need a persistent EEG monitoring, initiate as process to transfer patient to a tertiary hospital.  I have started the process with [facility] per family request.  They do not want to transfer to [facility].  They are aware that the transfer process may take several days.  Also started seizure medicine per neurology. Discussed with DR. Veal from [facility] neurology, will arrange for transfer.   9/15: EEG: moderate diffuse encephlopahty, nonspecific. No seizure   9/17 more interactive today. EEG done.  9/19- pt more awake and alert, responding more to my questions.    Consultants:   Neurology, ID, interventional radiology  Procedures: Status post LP on 9/14 MRI Brain with and without contrast on 07/24/20: 1. Stable MRI appearance of the brain since 07/13/2020, with no acute or inflammatory process identified. 2. Mild for age nonspecific white matter signal changes, most commonly due to chronic small vessel disease. 3. Minor paranasal sinus mucosal thickening with right nasoenteric tube in place.  MRA head without contrast on 07/24/20: 1. Intracranial MRA degraded by motion, such that the circle-of-Willis branch detail is degraded in the anterior circulation. There is no evidence of a large vessel vasculitis. 2. No large vessel occlusion or significant arterial stenosis identified. There is generalized arterial tortuosity.  EEG on 9/16: This study is suggestive of moderate diffuse encephalopathy, nonspecific etiology.No seizures or epileptiform discharges were seen throughout the recording.  EEG on 9/17: This study is suggestive of moderate diffuse encephalopathy, nonspecific etiology.No seizures or epileptiform discharges were seen throughout the  recording.  Antimicrobials:  Ampicillin and Rocephin. Acyclovir 9/11    Subjective: Daughter at bedside.  Per nursing and daughter patient did take some small bites of food.  Patient is sleepy this a.m. tries to open her eyes and respond but not successful.   Objective: Vitals:   07/29/20 1604 07/29/20 2029 07/30/20 0500 07/30/20 0748  BP: (!) 147/75 (!) 154/79  137/64  Pulse: 94 94  99  Resp: 16 16  17   Temp: 99 F (37.2 C) 98.9 F (37.2 C)  98.7 F (37.1 C)  TempSrc: Oral Oral  Oral  SpO2: 100% 100%  100%  Weight:   78.1 kg   Height:        Intake/Output Summary (Last 24 hours) at 07/30/2020 0957 Last data filed at 07/30/2020 0534 Gross per 24 hour  Intake 2069.05 ml  Output 1700 ml  Net 369.05 ml   Filed Weights   07/28/20 0412 07/29/20 0444 07/30/20 0500  Weight: 77.2 kg 76.2 kg 78.1 kg    Examination: Sleeping, difficult to arouse but does try to open her eyes, NAD  Tube feeding in place  Clear to auscultation no rales, rhonchi, wheezing  RRR, S1-S2 no murmurs rubs gallops  Abdomen soft nontender nondistended positive bowel sounds  No edema bilateral SCDs in place  Neuro exam difficult to assess as she is sleepy today     Data Reviewed: I have personally reviewed following labs and imaging studies  CBC: Recent Labs  Lab 07/23/20 1939 07/24/20 0705 07/25/20 0407 07/30/20 0434  WBC 9.8 8.6 7.5 8.8  NEUTROABS 7.4 6.8 5.7  --   HGB 11.1* 10.5* 10.3* 9.2*  HCT 31.2* 30.3* 29.8* 27.6*  MCV 91.2 94.7 95.5 96.8  PLT 311 266 284 278   Basic Metabolic Panel: Recent Labs  Lab 07/24/20 0705 07/24/20 0941 07/25/20 0407 07/25/20 0407 07/26/20 0946 07/27/20 0412 07/28/20 0902 07/29/20 0313 07/30/20 0434  NA 125*   < > 130*   < > 125* 128* 127* 126* 126*  K 3.9   < > 3.8  --  3.9 4.0 4.3  --  4.6  CL 93*   < > 94*  --  90* 91* 92*  --  91*  CO2 23   < > 26  --  26 29 26   --  25  GLUCOSE 175*   < > 126*  --  173* 152* 178*  --  184*  BUN  12   < > 11  --  14 20 23   --  23  CREATININE 0.76   < > 0.61  --  0.62 0.65 0.62  --  0.47  CALCIUM 8.3*   < > 8.8*  --  8.6* 8.6* 8.5*  --  8.7*  MG 1.7  --  1.9  --   --   --   --   --   --   PHOS 3.4  --  3.9  --   --   --   --   --   --    < > = values in this interval not displayed.   GFR: Estimated Creatinine Clearance: 56.8 mL/min (by C-G formula based on SCr of 0.47 mg/dL). Liver Function Tests: Recent Labs  Lab 07/23/20 1939 07/24/20 1825  AST 27  --   ALT 29  --   ALKPHOS 71  --   BILITOT 0.8  --   PROT 7.0  --   ALBUMIN 3.3* 3.4*   No results for input(s): LIPASE,  AMYLASE in the last 168 hours. No results for input(s): AMMONIA in the last 168 hours. Coagulation Profile: Recent Labs  Lab 07/24/20 0822  INR 1.1   Cardiac Enzymes: No results for input(s): CKTOTAL, CKMB, CKMBINDEX, TROPONINI in the last 168 hours. BNP (last 3 results) No results for input(s): PROBNP in the last 8760 hours. HbA1C: No results for input(s): HGBA1C in the last 72 hours. CBG: Recent Labs  Lab 07/29/20 1606 07/29/20 2023 07/30/20 0027 07/30/20 0344 07/30/20 0749  GLUCAP 188* 188* 157* 187* 181*   Lipid Profile: No results for input(s): CHOL, HDL, LDLCALC, TRIG, CHOLHDL, LDLDIRECT in the last 72 hours. Thyroid Function Tests: No results for input(s): TSH, T4TOTAL, FREET4, T3FREE, THYROIDAB in the last 72 hours. Anemia Panel: No results for input(s): VITAMINB12, FOLATE, FERRITIN, TIBC, IRON, RETICCTPCT in the last 72 hours. Sepsis Labs: Recent Labs  Lab 07/24/20 0705 07/25/20 0407  PROCALCITON <0.10 <0.10    Recent Results (from the past 240 hour(s))  CULTURE, BLOOD (ROUTINE X 2) w Reflex to ID Panel     Status: None   Collection Time: 07/23/20  7:46 PM   Specimen: BLOOD  Result Value Ref Range Status   Specimen Description BLOOD LEFT [facility]  Final   Special Requests   Final    BOTTLES DRAWN AEROBIC AND ANAEROBIC Blood Culture adequate volume   Culture   Final    NO  GROWTH 5 DAYS Performed at [facility], [address] [place], [place] [address]    Report Status 07/28/2020 FINAL  Final  CULTURE, BLOOD (ROUTINE X 2) w Reflex to ID Panel     Status: None   Collection Time: 07/23/20  7:47 PM   Specimen: BLOOD  Result Value Ref Range Status   Specimen Description BLOOD RIGHT [facility]  Final   Special Requests   Final    BOTTLES DRAWN AEROBIC AND ANAEROBIC Blood Culture adequate volume   Culture   Final    NO GROWTH 5 DAYS Performed at [facility], [address] [place], [place] [address]    Report Status 07/28/2020 FINAL  Final  Culture, fungus without smear     Status: None (Preliminary result)   Collection Time: 07/24/20  1:15 PM   Specimen: CSF; Cerebrospinal Fluid  Result Value Ref Range Status   Specimen Description   Final    CSF Performed at [facility], [address], [place], [place] [address]    Special Requests   Final    Normal Performed at [facility], [address] [place], [place] [address]    Culture   Final    NO FUNGUS ISOLATED AFTER 5 DAYS Performed at [facility], [address], [place], [place] [address]    Report Status PENDING  Incomplete  Anaerobic culture     Status: None   Collection Time: 07/24/20  1:15 PM   Specimen: CSF; Cerebrospinal Fluid  Result Value Ref Range Status   Specimen Description   Final    CSF Performed at [facility], [address], [place], [place] [address]    Special Requests   Final    Normal Performed at [facility], [address], [place], [place] [address]    Culture   Final    NO ANAEROBES ISOLATED Performed at [facility], [address], [place], [place] [address]    Report Status 07/29/2020 FINAL  Final  CSF culture     Status: None  Collection Time: 07/24/20  1:15 PM   Specimen: PATH Cytology CSF; Cerebrospinal Fluid  Result Value Ref Range Status   Specimen Description   Final     CSF Performed at [facility], [address], [place], [place] [address]    Special Requests   Final    NONE Performed at [facility], [address] [place], [place] [address]    Gram Stain   Final    WBC SEEN NO ORGANISMS SEEN Performed at [facility], [address], [place], [place] [address]    Culture   Final    NO GROWTH Performed at [facility], [address], [place], [place] [address]    Report Status 07/28/2020 FINAL  Final         Radiology Studies: No results found.      Scheduled Meds: . Chlorhexidine Gluconate Cloth  6 each Topical Daily  . docusate sodium  100 mg Oral BID  . enoxaparin (LOVENOX) injection  40 mg Subcutaneous Q24H  . Ferrous Fumarate  1 tablet Oral BID  . gabapentin  100 mg Oral BID  . insulin aspart  0-9 Units Subcutaneous Q4H  . lactulose  20 g Oral BID  . metoprolol tartrate  12.5 mg Oral BID  . multivitamin with minerals  1 tablet Per Tube Daily  . pantoprazole  40 mg Oral BID  . rosuvastatin  20 mg Oral Daily  . sodium chloride flush  3 mL Intravenous Q12H  . tamsulosin  0.4 mg Oral QPC supper  . thiamine  100 mg Oral Daily   Continuous Infusions: . sodium chloride 10 mL/hr at 07/30/20 0400  . acyclovir 750 mg (07/30/20 0615)  . feeding supplement (VITAL AF 1.2 CAL) 60 mL/hr at 07/30/20 0400    Assessment & Plan:   Active Problems:   SIRS (systemic inflammatory response syndrome) (HCC)   Shingles rash   Delirium   Acute metabolic encephalopathy   Essential hypertension   Anemia   Acute lower UTI   Chronic diastolic CHF (congestive heart failure) (HCC)   Sepsis (HCC)   Change in mental status   Anorexia   Malnutrition of moderate degree   #1.  Acute metabolic cephalopathy. Less likely bacterial meningitis  Status post LP on 9/14: Lymphocytic pleocytosis with protein of 222 MRA/MRI no acute changes Ceftrizone and ampicillin DC'd  Neurology following-EEG initially no  seizure...> Keppra was discontinued Repeat EEG on 9/17 moderate diffuse encephalopathy, nonspecific etiology.No seizures or epileptiform discharges  CX negative and HSV DNA negative 9/20-overall her mental status has improved but not at baseline.  Her main issue currently is her functional status as she is very weak.  She started eating few small bites yesterday.   CSF autoimmune encephalitis panel is negative  CSF HSV PCR negative, enterovirus PCR negative, with CSF IgG is positive but IgM negative, West Nile serum IgM and IgG are both negative.  Likely false positive in CSF. 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  Shingles with aseptic meningitis. Continue IV acyclovir day 18 of 21  We will follow up with ID for any new recommendations     #3.  Anorexia with poor p.o. intake. On tube feeding will continue since her intake is poor.  She did start taking few bites of food yesterday.       4.  Hyponatremia.-Could be due to some element of volume depletion.  Encephalitis could also potentially be leading to SIADH. Nephrology was consulted, input was appreciated.  Recommend continue tube feeding and reevaluating serum sodium.  9/20-sodium today is 126.   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  Sepsis secondary to UTI and  viral infection. Improved  6.  Intra-abdominal fluid collection. CT with fluid collection likely pseudocyst, seroma, or biloma, not abscess.  IR did not recommend aspiration    7.  Urinary retention. Continue with Foley for now until she is more functional and will need to do trial of voiding  8.  Urinary tract infection. Resolved received ceftriaxone   9.  Chronic diastolic congestive heart failure. No exaceration Continue to monitor  10. Leg achiness/neuropathy- had this when she came in was started on neurontin, but d/c'd due to MS change.   daughter wanted me to give her something. Discussed with neurology about reinstituting neurontin and ok'd. Was started on Neurontin 100 mg p.o. twice daily so far tolerating from a mental status point.  Will minimize medications that may affect her mental status.   9/19-her sx improved with neurontin 9/20-we will continue on Neurontin current dose  DVT prophylaxis: lovenox Code Status:full Family Communication: daughter updated today at bedside  Status is: Inpatient  Remains inpatient appropriate because: inpatient level of care appropriate due to severity of illness  Dispo: The patient is from: Home              Anticipated d/c is to: SNF v.s [facility] (as was requested by daughter last week)              Anticipated d/c date is: >3 days              Patient currently is not medically stable to dc            LOS: 17 days   Time spent: with more than 50% on COC    Lynn Ito, MD [facility] Pager [phone]  If 7PM-7AM, please contact night-coverage [email or web address] Password [facility] 07/30/2020, 9:57 AM

## 2020-07-30 NOTE — Progress Notes (Signed)
   07/30/20 1936  Assess: MEWS Score  Temp 99.6 F (37.6 C)  BP (!) 148/73  Pulse Rate (!) 113  Resp 16  SpO2 96 %  O2 Device Room Air  Assess: MEWS Score  MEWS Temp 0  MEWS Systolic 0  MEWS Pulse 2  MEWS RR 0  MEWS LOC 1  MEWS Score 3  MEWS Score Color Yellow  Assess: if the MEWS score is Yellow or Red  Were vital signs taken at a resting state? Yes  Focused Assessment Change from prior assessment (see assessment flowsheet)  Early Detection of Sepsis Score *See Row Information* Low  MEWS guidelines implemented *See Row Information* No, other (Comment) (Elevated HR, contatced NP, Interventions given )  Treat  Pain Scale Faces  Pain Score 0  Faces Pain Scale 0  Neuro symptoms relieved by Rest  Take Vital Signs  Increase Vital Sign Frequency  Yellow: Q 2hr X 2 then Q 4hr X 2, if remains yellow, continue Q 4hrs  Escalate  MEWS: Escalate Yellow: discuss with charge nurse/RN and consider discussing with provider and RRT  Notify: Charge Nurse/RN  Name of Charge Nurse/RN Notified Merlene Morse   Date Charge Nurse/RN Notified 07/30/20  Time Charge Nurse/RN Notified 2005  Notify: Provider  Provider Name/Title Reyes Ivan  Date Provider Notified 07/30/20  Time Provider Notified 2006  Notification Type  (secure chat)  Notification Reason Other (Comment) (elevated HR)  Response No new orders  Document  Patient Outcome Not stable and remains on department

## 2020-07-30 NOTE — Progress Notes (Signed)
Assessed patient's swallow reflex twice today and while she did wake up for it, she would not swallow the sherbet I gave her. Held all meds and notified Dr. Marylu Lund of meds not given.

## 2020-07-30 NOTE — Evaluation (Signed)
Physical Therapy Re-Evaluation Patient Details Name: Casey Baldwin MRN: [id number] DOB: 31-Oct-1941 Today's Date: 07/30/2020   History of Present Illness  Casey Baldwin is a 79 y.o. 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  Presented to ER secondary to generalized body aches, progressive weakness, headaches; admitted for management of SIRS (urine?), shingles and acute metabolic encephalopathy.  Clinical Impression  Prior to hospital admission, pt was ambulatory.  D/t pt's extended hospitalization, PT re-evaluation performed.  Currently pt is 2 assist with bed mobility and pt quickly fatigued sitting edge of bed (once positioned in optimal sitting position, pt briefly close SBA but then gradually requiring increased assist of 1 and then 2 assist to maintain sitting balance so pt assisted back to bed).  Pt would benefit from skilled PT to address noted impairments and functional limitations (see below for any additional details).  POC reviewed and remains appropriate.  Upon hospital discharge, pt would benefit from STR.    Follow Up Recommendations SNF    Equipment Recommendations   (TBD at next facility)    Recommendations for Other Services OT consult     Precautions / Restrictions Precautions Precautions: Fall Restrictions Weight Bearing Restrictions: No Other Position/Activity Restrictions: NG tube; HOB >30 degrees      Mobility  Bed Mobility Overal bed mobility: Needs Assistance Bed Mobility: Supine to Sit;Sit to Supine Rolling: Mod assist;Max assist (logrolling to R to reposition sheets under pt)   Supine to sit: Mod assist;Max assist;+2 for physical assistance Sit to supine: Mod assist;Max assist;+2 for physical assistance   General bed mobility comments: assist for trunk and B LE's; vc's for technique and to assist with movements  Transfers                 General transfer  comment: Deferred d/t pt fatigued quickly sitting edge of bed and requiring assist to maintain upright balance in sitting  Ambulation/Gait             General Gait Details: not appropriate at this time  Stairs            Wheelchair Mobility    Modified Rankin (Stroke Patients Only)       Balance Overall balance assessment: Needs assistance Sitting-balance support: Bilateral upper extremity supported;Feet supported Sitting balance-Leahy Scale: Fair Sitting balance - Comments: able to sit briefly with close SBA but mostly pt requiring at least mod assist for sitting balance if not max assist with 2nd assist as well (cueing to shift weight forward but then pt started to lean more forward and towards R with fatigue); vc's and assist to promote upright head positioning (pt otherwise with head down and chin towards chest)                                     Pertinent Vitals/Pain Pain Assessment: Faces Faces Pain Scale: Hurts little more Pain Location: neck with movements Pain Descriptors / Indicators: Grimacing;Discomfort;Moaning Pain Intervention(s): Limited activity within patient's tolerance;Monitored during session;Repositioned    Home Living Family/patient expects to be discharged to:: Skilled nursing facility                      Prior Function Level of Independence: Independent with assistive device(s)         Comments: Per OT re-eval "PCA 6hrs/day supervises bathing / assists c  IADLs and a "maid" for cleaning. Enjoys hobbies of sewing/drawing".     Hand Dominance   Dominant Hand: Right    Extremity/Trunk Assessment   Upper Extremity Assessment Upper Extremity Assessment: Difficult to assess due to impaired cognition;Generalized weakness (squeezes therapists hand with L hand but not R with cueing; assists some with B shoulder flexion ROM with cueing)    Lower Extremity Assessment Lower Extremity Assessment: Difficult to assess  due to impaired cognition;Generalized weakness (able to wiggle toes and assist some with B LE heelslides)    Cervical / Trunk Assessment Cervical / Trunk Assessment: Normal  Communication   Communication: HOH  Cognition Arousal/Alertness:  (Alert once woken) Behavior During Therapy: Flat affect Overall Cognitive Status: No family/caregiver present to determine baseline cognitive functioning (pt did not answer A&O questions)                                 General Comments: Pt did not answer A&O questions; able to follow simple 1 step commands occasionally      General Comments   Nursing cleared pt for participation in physical therapy.  Pt agreeable to PT session.    Exercises  Sitting balance and bed mobility   Assessment/Plan    PT Assessment Patient needs continued PT services  PT Problem List Decreased strength;Decreased activity tolerance;Decreased balance;Decreased mobility;Decreased cognition;Decreased knowledge of use of DME;Decreased safety awareness;Decreased knowledge of precautions       PT Treatment Interventions DME instruction;Gait training;Functional mobility training;Therapeutic activities;Therapeutic exercise;Balance training;Patient/family education    PT Goals (Current goals can be found in the Care Plan section)  Acute Rehab PT Goals Patient Stated Goal: To return home  PT Goal Formulation: With patient Time For Goal Achievement: 08/13/20 Potential to Achieve Goals: Fair    Frequency Min 2X/week   Barriers to discharge Decreased caregiver support      Co-evaluation               AM-PAC PT "6 Clicks" Mobility  Outcome Measure Help needed turning from your back to your side while in a flat bed without using bedrails?: A Lot Help needed moving from lying on your back to sitting on the side of a flat bed without using bedrails?: Total Help needed moving to and from a bed to a chair (including a wheelchair)?: Total Help needed  standing up from a chair using your arms (e.g., wheelchair or bedside chair)?: Total Help needed to walk in hospital room?: Total Help needed climbing 3-5 steps with a railing? : Total 6 Click Score: 7    End of Session Equipment Utilized During Treatment: Oxygen Activity Tolerance: Patient limited by fatigue Patient left: in bed;with call bell/phone within reach;with bed alarm set;Other (comment);with SCD's reapplied (B heels floating via pillow support) Nurse Communication: Mobility status;Precautions PT Visit Diagnosis: Muscle weakness (generalized) (M62.81);Difficulty in walking, not elsewhere classified (R26.2);Other abnormalities of gait and mobility (R26.89)    Time: 1117-1140 PT Time Calculation (min) (ACUTE ONLY): 23 min   Charges:   PT Evaluation $PT Re-evaluation: 1 Re-eval PT Treatments $Therapeutic Activity: 23-37 mins       Hendricks Limes, PT 07/30/20, 12:29 PM

## 2020-07-31 LAB — GLUCOSE, CAPILLARY
Glucose-Capillary: 146 mg/dL — ABNORMAL HIGH (ref 70–99)
Glucose-Capillary: 165 mg/dL — ABNORMAL HIGH (ref 70–99)
Glucose-Capillary: 175 mg/dL — ABNORMAL HIGH (ref 70–99)
Glucose-Capillary: 178 mg/dL — ABNORMAL HIGH (ref 70–99)
Glucose-Capillary: 179 mg/dL — ABNORMAL HIGH (ref 70–99)
Glucose-Capillary: 208 mg/dL — ABNORMAL HIGH (ref 70–99)

## 2020-07-31 LAB — SODIUM: Sodium: 129 mmol/L — ABNORMAL LOW (ref 135–145)

## 2020-07-31 LAB — POTASSIUM: Potassium: 4.1 mmol/L (ref 3.5–5.1)

## 2020-07-31 MED ORDER — PANTOPRAZOLE SODIUM 40 MG PO PACK
40.0000 mg | PACK | Freq: Every day | ORAL | Status: DC
  Administered 2020-07-31 – 2020-08-18 (×17): 40 mg
  Filled 2020-07-31 (×21): qty 20

## 2020-07-31 MED ORDER — ADULT MULTIVITAMIN LIQUID CH
15.0000 mL | Freq: Every day | ORAL | Status: DC
  Administered 2020-07-31 – 2020-08-20 (×20): 15 mL
  Filled 2020-07-31 (×23): qty 15

## 2020-07-31 MED ORDER — SODIUM CHLORIDE 0.9 % IV SOLN
750.0000 mg | Freq: Three times a day (TID) | INTRAVENOUS | Status: DC
  Administered 2020-07-31 – 2020-08-02 (×7): 750 mg via INTRAVENOUS
  Filled 2020-07-31 (×9): qty 15

## 2020-07-31 NOTE — Progress Notes (Signed)
[facility]  ROUNDING NOTE   Subjective:   Na 129.  Patient without complaints  Objective:  Vital signs in last 24 hours:  Temp:  [97.4 F (36.3 C)-100.5 F (38.1 C)] 97.4 F (36.3 C) (09/21 0721) Pulse Rate:  [92-113] 99 (09/21 0721) Resp:  [14-32] 16 (09/21 0721) BP: (130-149)/(66-76) 138/70 (09/21 0721) SpO2:  [95 %-100 %] 97 % (09/21 0721)  Weight change:  Filed Weights   07/28/20 0412 07/29/20 0444 07/30/20 0500  Weight: 77.2 kg 76.2 kg 78.1 kg    Intake/Output: I/O last 3 completed shifts: In: 705 [P.O.:20; I.V.:10; NG/GT:180; IV Piggyback:495] Out: 1375 [Urine:375; Emesis/NG output:1000]   Intake/Output this shift:  Total I/O In: 120 [P.O.:120] Out: -   Physical Exam: General: Lying in bed, lethargic, drowsy  Head: Normocephalic,Atraumatic,NG tube in place with tube feeds 60 ml/hr  Eyes:  Eyes closed,trying to open to call, Sclerae and conjunctivae clear  Neck:  Trachea at the midline  Lungs:  Respiration even, unlabored, Lungs clear bilaterally  Heart: S1S2 +, no rubs or gallop  Abdomen:  Non distended, nontender,   Extremities:  No peripheral edema.  Neurologic: Drowsy, lethargic, opens eyes to call  Skin: No acute rashes or lesions        Basic Metabolic Panel: Recent Labs  Lab 07/25/20 0407 07/25/20 0407 07/26/20 0946 07/26/20 0946 07/27/20 0412 07/28/20 0902 07/29/20 0313 07/30/20 0434 07/31/20 0408  NA 130*   < > 125*   < > 128* 127* 126* 126* 129*  K 3.8   < > 3.9  --  4.0 4.3  --  4.6 4.1  CL 94*  --  90*  --  91* 92*  --  91*  --   CO2 26  --  26  --  29 26  --  25  --   GLUCOSE 126*  --  173*  --  152* 178*  --  184*  --   BUN 11  --  14  --  20 23  --  23  --   CREATININE 0.61  --  0.62  --  0.65 0.62  --  0.47  --   CALCIUM 8.8*   < > 8.6*   < > 8.6* 8.5*  --  8.7*  --   MG 1.9  --   --   --   --   --   --   --   --   PHOS 3.9  --   --   --   --   --   --   --   --    < > = values in this interval not displayed.     Liver Function Tests: Recent Labs  Lab 07/24/20 1825  ALBUMIN 3.4*   No results for input(s): LIPASE, AMYLASE in the last 168 hours. No results for input(s): AMMONIA in the last 168 hours.  CBC: Recent Labs  Lab 07/25/20 0407 07/30/20 0434  WBC 7.5 8.8  NEUTROABS 5.7  --   HGB 10.3* 9.2*  HCT 29.8* 27.6*  MCV 95.5 96.8  PLT 284 278    Cardiac Enzymes: No results for input(s): CKTOTAL, CKMB, CKMBINDEX, TROPONINI in the last 168 hours.  BNP: Invalid input(s): POCBNP  CBG: Recent Labs  Lab 07/30/20 1627 07/30/20 1938 07/31/20 0001 07/31/20 0336 07/31/20 0723  GLUCAP 170* 200* 165* 175* 178*    Microbiology: Results for orders placed or performed during the hospital encounter of 07/12/20  Urine culture  Status: Abnormal   Collection Time: 07/12/20  9:23 PM   Specimen: Urine, Random  Result Value Ref Range Status   Specimen Description   Final    URINE, RANDOM Performed at [facility], [address] [place], [place] [address]    Special Requests   Final    NONE Performed at [facility], [address] [place], [place] [address]    Culture MULTIPLE SPECIES PRESENT, SUGGEST RECOLLECTION (A)  Final   Report Status 07/15/2020 FINAL  Final  Culture, blood (routine x 2)     Status: None   Collection Time: 07/12/20  9:23 PM   Specimen: BLOOD  Result Value Ref Range Status   Specimen Description BLOOD RIGHT AC  Final   Special Requests   Final    BOTTLES DRAWN AEROBIC AND ANAEROBIC Blood Culture results may not be optimal due to an excessive volume of blood received in culture bottles   Culture   Final    NO GROWTH 5 DAYS Performed at [facility], [address], [place], [place] [address]    Report Status 07/17/2020 FINAL  Final  Culture, blood (routine x 2)     Status: None   Collection Time: 07/12/20  9:23 PM   Specimen: BLOOD  Result Value Ref Range Status   Specimen Description BLOOD RIGHT WRIST  Final    Special Requests   Final    BOTTLES DRAWN AEROBIC AND ANAEROBIC Blood Culture adequate volume   Culture   Final    NO GROWTH 5 DAYS Performed at [facility], [address], [place], [place] [address]    Report Status 07/17/2020 FINAL  Final  SARS Coronavirus 2 by RT PCR (hospital order, performed in [facility] hospital lab) Nasopharyngeal Nasopharyngeal Swab     Status: None   Collection Time: 07/12/20 11:13 PM   Specimen: Nasopharyngeal Swab  Result Value Ref Range Status   SARS Coronavirus 2 NEGATIVE NEGATIVE Final    Comment: (NOTE) SARS-CoV-2 target nucleic acids are NOT DETECTED.  The SARS-CoV-2 RNA is generally detectable in upper and lower respiratory specimens during the acute phase of infection. The lowest concentration of SARS-CoV-2 viral copies this assay can detect is 250 copies / mL. A negative result does not preclude SARS-CoV-2 infection and should not be used as the sole basis for treatment or other patient management decisions.  A negative result may occur with improper specimen collection / handling, submission of specimen other than nasopharyngeal swab, presence of viral mutation(s) within the areas targeted by this assay, and inadequate number of viral copies (<250 copies / mL). A negative result must be combined with clinical observations, patient history, and epidemiological information.  Fact Sheet for Patients:   BoilerBrush.com.cy  Fact Sheet for Healthcare Providers: https://pope.com/  This test is not yet approved or  cleared by the [place] FDA and has been authorized for detection and/or diagnosis of SARS-CoV-2 by FDA under an Emergency Use Authorization (EUA).  This EUA will remain in effect (meaning this test can be used) for the duration of the COVID-19 declaration under Section 564(b)(1) of the Act, 21 U.S.C. section 360bbb-3(b)(1), unless the authorization is terminated or revoked  sooner.  Performed at [facility], [address] [place], [place] [address]   MRSA PCR Screening     Status: None   Collection Time: 07/13/20  2:02 AM   Specimen: Nasal Mucosa; Nasopharyngeal  Result Value Ref Range Status   MRSA by PCR NEGATIVE  NEGATIVE Final    Comment:        The GeneXpert MRSA Assay (FDA approved for NASAL specimens only), is one component of a comprehensive MRSA colonization surveillance program. It is not intended to diagnose MRSA infection nor to guide or monitor treatment for MRSA infections. Performed at [facility], [address] [place], [place] [address]   CULTURE, BLOOD (ROUTINE X 2) w Reflex to ID Panel     Status: None   Collection Time: 07/16/20  5:36 PM   Specimen: BLOOD  Result Value Ref Range Status   Specimen Description [facility]OD [facility]  Final   Special Requests   Final    BOTTLES DRAWN AEROBIC ONLY Blood Culture adequate volume   Culture   Final    NO GROWTH 5 DAYS Performed at [facility], [address] [place], [place] [address]    Report Status 07/21/2020 FINAL  Final  CULTURE, BLOOD (ROUTINE X 2) w Reflex to ID Panel     Status: None   Collection Time: 07/16/20  5:36 PM   Specimen: BLOOD  Result Value Ref Range Status   Specimen Description BLOOD [facility]  Final   Special Requests   Final    BOTTLES DRAWN AEROBIC AND ANAEROBIC Blood Culture adequate volume   Culture   Final    NO GROWTH 5 DAYS Performed at [facility], [address] [place], [place] [address]    Report Status 07/21/2020 FINAL  Final  CULTURE, BLOOD (ROUTINE X 2) w Reflex to ID Panel     Status: None   Collection Time: 07/23/20  7:46 PM   Specimen: BLOOD  Result Value Ref Range Status   Specimen Description [facility]LOOD LEFT [facility]  Final   Special Requests   Final    BOTTLES DRAWN AEROBIC AND ANAEROBIC Blood Culture adequate volume   Culture   Final    NO GROWTH 5 DAYS Performed at [facility], [address] [place], [place] [address]    Report Status 07/28/2020 FINAL  Final  CULTURE, BLOOD (ROUTINE X 2) w Reflex to ID Panel     Status: None   Collection Time: 07/23/20  7:47 PM   Specimen: BLOOD  Result Value Ref Range Status   Specimen Description BLOOD RIGHT [facility]  Final   Special Requests   Final    BOTTLES DRAWN AEROBIC AND ANAEROBIC Blood Culture adequate volume   Culture   Final    NO GROWTH 5 DAYS Performed at [facility], [address] [place], [place] [address]    Report Status 07/28/2020 FINAL  Final  Culture, fungus without smear     Status: None (Preliminary result)   Collection Time: 07/24/20  1:15 PM   Specimen: CSF; Cerebrospinal Fluid  Result Value Ref Range Status   Specimen Description   Final    CSF Performed at [facility], [address] [place], [place] [address]    Special Requests   Final    Normal Performed at [facility], [address] [place], [place] [address]    Culture   Final    NO FUNGUS ISOLATED AFTER 6 DAYS Performed at [facility], [address] [place], [place] [address]    Report Status PENDING  Incomplete  Anaerobic culture     Status: None   Collection Time: 07/24/20  1:15 PM   Specimen: CSF; Cerebrospinal Fluid  Result Value Ref Range Status   Specimen Description  Final    CSF Performed at [facility], [address] [place], [place] [address]    Special Requests   Final    Normal Performed at [facility], [address] [place], [place] [address]    Culture   Final    NO ANAEROBES ISOLATED Performed at [facility], [address] [place], [place] [address]    Report Status 07/29/2020 FINAL  Final  CSF culture     Status: None   Collection Time: 07/24/20  1:15 PM   Specimen: PATH Cytology CSF; Cerebrospinal Fluid  Result Value Ref Range Status   Specimen Description   Final    CSF Performed at [facility], [address], [place],  [place] [address]    Special Requests   Final    NONE Performed at [facility], [address], [place], [place] [address]    Gram Stain   Final    WBC SEEN NO ORGANISMS SEEN Performed at [facility], [address] [place], [place] [address]    Culture   Final    NO GROWTH Performed at [facility], [address] [place], [place] [address]    Report Status 07/28/2020 FINAL  Final    Coagulation Studies: No results for input(s): LABPROT, INR in the last 72 hours.  Urinalysis: No results for input(s): COLORURINE, LABSPEC, PHURINE, GLUCOSEU, HGBUR, BILIRUBINUR, KETONESUR, PROTEINUR, UROBILINOGEN, NITRITE, LEUKOCYTESUR in the last 72 hours.  Invalid input(s): APPERANCEUR    Imaging: No results found.   Medications:   . sodium chloride 10 mL/hr at 07/30/20 0400  . acyclovir 750 mg (07/31/20 3664)  . feeding supplement (VITAL AF 1.2 CAL) 60 mL/hr at 07/30/20 0400   . Chlorhexidine Gluconate Cloth  6 each Topical Daily  . docusate  100 mg Per Tube BID  . enoxaparin (LOVENOX) injection  40 mg Subcutaneous Q24H  . feeding supplement (KATE FARMS STANDARD 1.4)  325 mL Oral BID BM  . Ferrous Fumarate  1 tablet Oral BID  . free water  20 mL Per Tube Q6H  . insulin aspart  0-9 Units Subcutaneous Q4H  . lactulose  20 g Oral BID  . metoprolol tartrate  12.5 mg Per Tube BID  . multivitamin  15 mL Per Tube Daily  . pantoprazole sodium  40 mg Per Tube Daily  . rosuvastatin  20 mg Oral Daily  . sodium chloride flush  3 mL Intravenous Q12H  . tamsulosin  0.4 mg Oral QPC supper  . thiamine  100 mg Oral Daily   sodium chloride, acetaminophen **OR** acetaminophen, haloperidol lactate, liver oil-zinc oxide, ondansetron **OR** ondansetron (ZOFRAN) IV  Assessment/ Plan:  Ms. Casey Baldwin is a 79 y.o. 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,  diabetes mellitus type 2, morbid obesity who was admitted to [facility] on 07/12/2020 for Transient alteration of awareness [R40.4] RUQ pain [R10.11] Abnormal CT scan [R93.89] Change in mental status [R41.82] SIRS (systemic inflammatory response syndrome) (HCC) [R65.10] Fever, unspecified fever cause [R50.9] Urinary tract infection with hematuria, site unspecified [N39.0, R31.9] Edema of abdominal wall [R60.0]  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      LOS: 18 Casey Baldwin  Casey Baldwin 9/21/202112:08 PM

## 2020-07-31 NOTE — Progress Notes (Signed)
   07/31/20 0035  Assess: MEWS Score  Temp 98.3 F (36.8 C)  BP (!) 141/73  Pulse Rate (!) 110  Resp (!) 24  Level of Consciousness Alert (slow to respond)  SpO2 98 %  O2 Device Room Air  Assess: MEWS Score  MEWS Temp 0  MEWS Systolic 0  MEWS Pulse 1  MEWS RR 1  MEWS LOC 0  MEWS Score 2  MEWS Score Color Yellow  Assess: if the MEWS score is Yellow or Red  Were vital signs taken at a resting state? Yes  Focused Assessment No change from prior assessment  Early Detection of Sepsis Score *See Row Information* Low  MEWS guidelines implemented *See Row Information* No, other (Comment)  Treat  MEWS Interventions Administered prn meds/treatments  Pain Scale Faces  Breathing 1  Negative Vocalization 1  Facial Expression 2  Body Language 1  Consolability 1  PAINAD Score 6  Take Vital Signs  Increase Vital Sign Frequency  Yellow: Q 2hr X 2 then Q 4hr X 2, if remains yellow, continue Q 4hrs  Escalate  MEWS: Escalate Yellow: discuss with charge nurse/RN and consider discussing with provider and RRT  Notify: Charge Nurse/RN  Name of Charge Nurse/RN Notified Deborha Gladding   Date Charge Nurse/RN Notified 07/31/20  Time Charge Nurse/RN Notified 0041  Notify: Provider  Response No new orders  Document  Patient Outcome Stabilized after interventions

## 2020-07-31 NOTE — Progress Notes (Addendum)
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 Rash is intensely pruritic different stages of healing some areas of vesicular be red border. Some areas covered in black eschars. Skin is diffusely tender.Per family (asked by me) pt developed confusion. Was c/o side to back pain.  Sed rate was elevated. LP was considered but patient declined stating that she had a history of meningitis in the past and very scared of having LP done. Hospitalist was called for admission for shingles and possible encephalitis.  9/8.IV acyclovir was changed to Valtrex. Patient has not had a bowel movement since admission, has poor appetite. Giving lactulose, increase the Protonix to twice a day.  9/9.Patient refused CPAP last night, she became more confused today. She also has reversed sleeping cycle. Added Seroquel 25 mg evening. Schedule lactulose twice a day for constipation.  9/10.Patient slept last night with Seroquel. Still very confused and fatigued.Seen by neurologist, suspect metabolic source. Repeated CT scan of the abdomen/pelvis, intra-abdominal fluid collection still present.  9/13.Dobbhoff placed, tube feeding started. Also consult psychiatry for 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  9/14.  Patient mental status not improving.  Had a fever of 102 yesterday evening.  ID restart antibiotics with Rocephin and ampicillin.  LP is performed today LP is performed.  Study consistent with viral  meningitis/encephalitis.  Discussed with infect disease and neurology, neurology concerned for subacute seizure.  MRI of the brain and MRA will be performed.  EEG is also ordered.  However, patient will need a persistent EEG monitoring, initiate as process to transfer patient to a tertiary hospital.  I have started the process with [facility] per family request.  They do not want to transfer to [facility].  They are aware that the transfer process may take several days.  Also started seizure medicine per neurology. Discussed with DR. Veal from [facility] neurology, will arrange for transfer.   9/15: EEG: moderate diffuse encephlopahty, nonspecific. No seizure   9/17 more interactive today. EEG done.  9/19- pt more awake and alert, responding more to my questions.   9/21  I called [facility] transfer center, she is still on transfer list, but no beds available and told me they dont know when she will be transferred.  Transfer was initiated last week by Dr. Chipper Herb  Per families request. I updated the status to the daughter.  Consultants:   Neurology, ID, interventional radiology  Procedures: Status post LP on 9/14 MRI Brain with and without contrast on 07/24/20: 1. Stable MRI appearance of the brain since 07/13/2020, with no acute or inflammatory process identified. 2. Mild for age nonspecific white matter signal changes, most commonly due to chronic small vessel disease. 3. Minor paranasal sinus mucosal thickening with right nasoenteric tube in place.  MRA head without contrast on 07/24/20: 1. Intracranial MRA degraded by motion, such that the circle-of-Willis branch detail is degraded in the anterior circulation. There is no evidence of a large vessel vasculitis. 2. No large vessel occlusion or significant arterial stenosis identified. There is generalized arterial tortuosity.  EEG on 9/16: This study is suggestive of moderate diffuse encephalopathy, nonspecific etiology.No seizures  or epileptiform discharges were seen throughout the recording.  EEG on 9/17: This study is suggestive of moderate diffuse encephalopathy, nonspecific etiology.No seizures or epileptiform discharges were seen throughout the recording.     Antimicrobials:  Ampicillin and Rocephin. Acyclovir 9/11    Subjective: Daughter at bedside. Pt snoring, barely opens eyes this am. TF in place   Objective: Vitals:   07/30/20 2300 07/31/20 0035 07/31/20 0247 07/31/20 0721  BP: 138/76 (!) 141/73 130/66 138/70  Pulse: 99 (!) 110 98 99  Resp: (!) 32 (!) 24 (!) 24 16  Temp: 100.1 F (37.8 C) 98.3 F (36.8 C) 99.1 F (37.3 C) (!) 97.4 F (36.3 C)  TempSrc: Oral Oral Oral Oral  SpO2: 96% 98% 98% 97%  Weight:      Height:        Intake/Output Summary (Last 24 hours) at 07/31/2020 1218 Last data filed at 07/31/2020 1003 Gross per 24 hour  Intake 570 ml  Output 275 ml  Net 295 ml   Filed Weights   07/28/20 0412 07/29/20 0444 07/30/20 0500  Weight: 77.2 kg 76.2 kg 78.1 kg    Examination: Snoring, deep sleep, difficult to arouse, but at times opens eyes, overall lethargic TF in place cta no r/r/w RRR, s1/s2, no m/r/g Soft, nt/nd +bs No edema , scd in place Unable to assess mood or neuro exam      Data Reviewed: I have personally reviewed following labs and imaging studies  CBC: Recent Labs  Lab 07/25/20 0407 07/30/20 0434  WBC 7.5 8.8  NEUTROABS 5.7  --   HGB 10.3* 9.2*  HCT 29.8* 27.6*  MCV 95.5 96.8  PLT 284 278   Basic Metabolic Panel: Recent Labs  Lab 07/25/20 0407 07/25/20 0407 07/26/20 0946 07/26/20 0946 07/27/20 0412 07/28/20 0902 07/29/20 0313 07/30/20 0434 07/31/20 0408  NA 130*   < > 125*   < > 128* 127* 126* 126* 129*  K 3.8   < > 3.9  --  4.0 4.3  --  4.6 4.1  CL 94*  --  90*  --  91* 92*  --  91*  --   CO2 26  --  26  --  29 26  --  25  --   GLUCOSE 126*  --  173*  --  152* 178*  --  184*  --   BUN 11  --  14  --  20 23  --  23  --     CREATININE 0.61  --  0.62  --  0.65 0.62  --  0.47  --   CALCIUM 8.8*  --  8.6*  --  8.6* 8.5*  --  8.7*  --   MG 1.9  --   --   --   --   --   --   --   --   PHOS 3.9  --   --   --   --   --   --   --   --    < > = values in this interval not displayed.   GFR: Estimated Creatinine Clearance: 56.8 mL/min (by C-G formula based on SCr of 0.47 mg/dL). Liver Function Tests: Recent Labs  Lab 07/24/20 1825  ALBUMIN 3.4*   No results for input(s): LIPASE, AMYLASE in the last 168 hours. No results for input(s): AMMONIA in the last 168 hours. Coagulation Profile: No  results for input(s): INR, PROTIME in the last 168 hours. Cardiac Enzymes: No results for input(s): CKTOTAL, CKMB, CKMBINDEX, TROPONINI in the last 168 hours. BNP (last 3 results) No results for input(s): PROBNP in the last 8760 hours. HbA1C: No results for input(s): HGBA1C in the last 72 hours. CBG: Recent Labs  Lab 07/30/20 1938 07/31/20 0001 07/31/20 0336 07/31/20 0723 07/31/20 1146  GLUCAP 200* 165* 175* 178* 179*   Lipid Profile: No results for input(s): CHOL, HDL, LDLCALC, TRIG, CHOLHDL, LDLDIRECT in the last 72 hours. Thyroid Function Tests: No results for input(s): TSH, T4TOTAL, FREET4, T3FREE, THYROIDAB in the last 72 hours. Anemia Panel: No results for input(s): VITAMINB12, FOLATE, FERRITIN, TIBC, IRON, RETICCTPCT in the last 72 hours. Sepsis Labs: Recent Labs  Lab 07/25/20 0407  PROCALCITON <0.10    Recent Results (from the past 240 hour(s))  CULTURE, BLOOD (ROUTINE X 2) w Reflex to ID Panel     Status: None   Collection Time: 07/23/20  7:46 PM   Specimen: BLOOD  Result Value Ref Range Status   Specimen Description [facility]OD [facility]  Final   Special Requests   Final    BOTTLES DRAWN AEROBIC AND ANAEROBIC Blood Culture adequate volume   Culture   Final    NO GROWTH 5 DAYS Performed at [facility], [address] [place], [place] [address]    Report Status 07/28/2020 FINAL  Final   CULTURE, BLOOD (ROUTINE X 2) w Reflex to ID Panel     Status: None   Collection Time: 07/23/20  7:47 PM   Specimen: BLOOD  Result Value Ref Range Status   Specimen Description [facility]LOOD RIGHT [facility]  Final   Special Requests   Final    BOTTLES DRAWN AEROBIC AND ANAEROBIC Blood Culture adequate volume   Culture   Final    NO GROWTH 5 DAYS Performed at [facility], [address] [place], [place] [address]    Report Status 07/28/2020 FINAL  Final  Culture, fungus without smear     Status: None (Preliminary result)   Collection Time: 07/24/20  1:15 PM   Specimen: CSF; Cerebrospinal Fluid  Result Value Ref Range Status   Specimen Description   Final    CSF Performed at [facility], [address] [place], [place] [address]    Special Requests   Final    Normal Performed at [facility], [address] [place], [place] [address]    Culture   Final    NO FUNGUS ISOLATED AFTER 6 DAYS Performed at [facility], [address] [place], [place] [address]    Report Status PENDING  Incomplete  Anaerobic culture     Status: None   Collection Time: 07/24/20  1:15 PM   Specimen: CSF; Cerebrospinal Fluid  Result Value Ref Range Status   Specimen Description   Final    CSF Performed at [facility], [address] [place], [place] [address]    Special Requests   Final    Normal Performed at [facility], [address] [place], [place] [address]    Culture   Final    NO ANAEROBES ISOLATED Performed at [facility], [address] [place], [place] [address]    Report Status 07/29/2020 FINAL  Final  CSF culture     Status: None   Collection Time: 07/24/20  1:15 PM   Specimen: PATH Cytology CSF; Cerebrospinal Fluid  Result Value Ref Range Status  Specimen Description   Final    CSF Performed at [facility], [address] [place], [place] [address]    Special Requests   Final    NONE Performed at  [facility], [address], [place], [place] [address]    Gram Stain   Final    WBC SEEN NO ORGANISMS SEEN Performed at [facility], [address] [place], [place] [address]    Culture   Final    NO GROWTH Performed at [facility], [address] [place], [place] [address]    Report Status 07/28/2020 FINAL  Final         Radiology Studies: No results found.      Scheduled Meds: . Chlorhexidine Gluconate Cloth  6 each Topical Daily  . docusate  100 mg Per Tube BID  . enoxaparin (LOVENOX) injection  40 mg Subcutaneous Q24H  . feeding supplement (KATE FARMS STANDARD 1.4)  325 mL Oral BID BM  . Ferrous Fumarate  1 tablet Oral BID  . free water  20 mL Per Tube Q6H  . insulin aspart  0-9 Units Subcutaneous Q4H  . lactulose  20 g Oral BID  . metoprolol tartrate  12.5 mg Per Tube BID  . multivitamin  15 mL Per Tube Daily  . pantoprazole sodium  40 mg Per Tube Daily  . rosuvastatin  20 mg Oral Daily  . sodium chloride flush  3 mL Intravenous Q12H  . tamsulosin  0.4 mg Oral QPC supper  . thiamine  100 mg Oral Daily   Continuous Infusions: . sodium chloride 10 mL/hr at 07/30/20 0400  . acyclovir 750 mg (07/31/20 3846)  . feeding supplement (VITAL AF 1.2 CAL) 60 mL/hr at 07/30/20 0400    Assessment & Plan:   Active Problems:   SIRS (systemic inflammatory response syndrome) (HCC)   Shingles rash   Delirium   Acute metabolic encephalopathy   Essential hypertension   Anemia   Acute lower UTI   Chronic diastolic CHF (congestive heart failure) (HCC)   Sepsis (HCC)   Change in mental status   Anorexia   Malnutrition of moderate degree   #1.  Acute metabolic cephalopathy. Less likely bacterial meningitis  Status post LP on 9/14: Lymphocytic pleocytosis with protein of 222 MRA/MRI no acute changes Ceftrizone and ampicillin DC'd  Neurology following-EEG initially no seizure...> Keppra was discontinued Repeat EEG on 9/17 moderate  diffuse encephalopathy, nonspecific etiology.No seizures or epileptiform discharges  CX negative and HSV DNA negative CSF autoimmune encephalitis panel is negative  CSF HSV PCR negative, enterovirus PCR negative, with CSF IgG is positive but IgM negative, West Nile serum IgM and IgG are both negative.  Likely false positive in CSF. 9/21- MS today unable to assess this morning. Appears lethargic and sleepy. It appears to wax and wane, not even close to her baseliIne. T-max 100.5 Ck UA/ucx as she has foley in place. ID presumed diagnosis remains VZV induced aseptic meningitis (possible encephalitis versus encephalopathy) likely combination of encephalitis, metabolic encephalopathy and hyponatremia Continue acyclovir day 19 of 21 Out of bed to chair as tolerated if mental status allows   #2.  Shingles with aseptic meningitis. 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  was started on beta blockers. Will continue Did have a run yesterday.  Need to monitor Echo 9/7- nml EF  #3.  Anorexia with poor  p.o. intake. Continue tube feeding since has decreased p.o. intake. 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  Hyponatremia.-Could be due to some element of volume depletion.  Encephalitis could also potentially be leading to SIADH. Tolvaptan not indicated at this time  TSH elevated at 6.562, this will need to be rechecked after acute issues resolved  9/21- was started on IV fluids normal saline, today's sodium is 129 improving We will continue free water flushes   5.  Sepsis secondary to UTI and viral infection. Improved. See #1, rechecking UA and urine culture  6.  Intra-abdominal fluid collection. CT with fluid collection likely pseudocyst, seroma or biloma, not abscess  IR did not recommend aspiration    7.  Urinary retention. Continue with Foley for now  until she is more functional and will need to do trial of voiding once mental status improves    8.  Urinary tract infection. Resolved received ceftriaxone   9.  Chronic diastolic congestive heart failure. No exaceration Continue to monitor  10. Leg achiness/neuropathy- had this when she came in was started on neurontin, but d/c'd due to MS change.   daughter wanted me to give her something. Discussed with neurology about reinstituting neurontin and ok'd. Was started on Neurontin 100 mg p.o. twice daily so far tolerating from a mental status point.  Will minimize medications that may affect her mental status.   9/19-her sx improved with neurontin 9/20-we will continue on Neurontin current dose 9/21 neurontin was d/c'd? . Likely due to MS   DVT prophylaxis: lovenox Code Status:full Family Communication: daughter updated today at bedside  Status is: Inpatient  Remains inpatient appropriate because: inpatient level of care appropriate due to severity of illness  Dispo: The patient is from: Home              Anticipated d/c is to: SNF v.s [facility] (as was requested by daughter last week, still on waiting list, no bed available thus far).              Anticipated d/c date is: >3 days              Patient currently is not medically stable to dc . MS  Not at baseline.            LOS: 18 days   Time spent: with more than 50% on COC    Lynn Ito, MD [facility] Pager [phone]  If 7PM-7AM, please contact night-coverage [email or web address] Password [facility] 07/31/2020, 12:18 PM

## 2020-07-31 NOTE — Progress Notes (Signed)
Occupational Therapy Treatment Patient Details Name: Casey Baldwin MRN: [id number] DOB: 1941/01/31 Today's Date: 07/31/2020    History of present illness Casey Baldwin is a 79 y.o. 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  Presented to ER secondary to generalized body aches, progressive weakness, headaches; admitted for management of SIRS (urine?), shingles and acute metabolic encephalopathy.   OT comments  Ms Dunford was seen for OT treatment on this date. Upon arrival to room pt reclined in bed c daughter at bed side. Daughter instructed in bed level HEP and pt participated in bed level THEREX: 1 set x 3reps each - overhead press, heel slides, ankle pumps, fist pumps. Heated blanket applied to neck and pt achieved midline head position c MAX A. Per conversation c PT, pt currently not appropriate for OOB to chair. Pt left in bed-in-chair position to improve sitting tolerance. Family verbalized and demonstrated understanding of instruction provided. Pt making progress toward goals. Pt continues to benefit from skilled OT services to maximize return to PLOF and minimize risk of future falls, injury, caregiver burden, and readmission. Will continue to follow POC. Discharge recommendation remains appropriate.     Follow Up Recommendations  SNF    Equipment Recommendations  Other (comment) (TBD)    Recommendations for Other Services      Precautions / Restrictions Precautions Precautions: Fall Restrictions Weight Bearing Restrictions: No Other Position/Activity Restrictions: NG tube; HOB >30 degrees       Mobility Bed Mobility Overal bed mobility: Needs Assistance      General bed mobility comments: TOTAL A adjust torso/neck position  Transfers    General transfer comment: Unsafe to attempt at this time        ADL either performed or assessed with clinical judgement   ADL Overall ADL's :  Needs assistance/impaired      General ADL Comments: MAX A adjust neck position for optimal feeding and prevent contractures.       Cognition Arousal/Alertness: Awake/alert Behavior During Therapy: Flat affect Overall Cognitive Status: Impaired/Different from baseline Area of Impairment: Attention;Memory;Following commands;Safety/judgement;Awareness;Problem solving      Orientation Level: Disoriented to;Place;Time;Situation   Memory: Decreased recall of precautions;Decreased short-term memory Following Commands: Follows one step commands inconsistently Safety/Judgement: Decreased awareness of safety;Decreased awareness of deficits   Problem Solving: Slow processing;Decreased initiation;Difficulty sequencing;Requires verbal cues;Requires tactile cues          Exercises Exercises: Other exercises General Exercises - Upper Extremity Shoulder Flexion: AAROM;Both;5 reps;Supine Shoulder Extension: AROM;Strengthening;Both;5 reps;Supine Elbow Flexion: AROM;Both;5 reps;Supine Elbow Extension: AROM;Both;5 reps;Supine General Exercises - Lower Extremity Ankle Circles/Pumps: AROM;Strengthening;Both;10 reps;Supine Heel Slides: AAROM;Both;5 reps;Supine Other Exercises Other Exercises: Pt and family educated re: HEP, positioning to prevent contracture, d/c recs, importance of mobility to maintain strength Other Exercises: bed level HEP, bed-in-chair position tolerance           Pertinent Vitals/ Pain       Pain Assessment: Faces Faces Pain Scale: Hurts little more Pain Location: neck with movements Pain Descriptors / Indicators: Grimacing;Discomfort;Moaning Pain Intervention(s): Limited activity within patient's tolerance;Monitored during session   Frequency  Min 1X/week        Progress Toward Goals  OT Goals(current goals can now be found in the care plan section)  Progress towards OT goals: Progressing toward goals  Acute Rehab OT Goals Patient Stated Goal: To return  home  OT Goal Formulation: With family Time For Goal Achievement: 08/10/20 Potential to Achieve Goals:  Fair ADL Goals Pt Will Perform Grooming: with min assist;with caregiver independent in assisting;bed level Pt Will Perform Lower Body Dressing: with mod assist;bed level;with caregiver independent in assisting Pt Will Transfer to Toilet: with mod assist (c LRAD PRN)  Plan Frequency remains appropriate;Discharge plan needs to be updated       AM-PAC OT "6 Clicks" Daily Activity     Outcome Measure   Help from another person eating meals?: A Lot Help from another person taking care of personal grooming?: A Lot Help from another person toileting, which includes using toliet, bedpan, or urinal?: Total Help from another person bathing (including washing, rinsing, drying)?: A Lot Help from another person to put on and taking off regular upper body clothing?: A Lot Help from another person to put on and taking off regular lower body clothing?: A Lot 6 Click Score: 11    End of Session    OT Visit Diagnosis: Other abnormalities of gait and mobility (R26.89)   Activity Tolerance Patient limited by fatigue   Patient Left in bed;with call bell/phone within reach;with family/visitor present   Nurse Communication Mobility status        Time: 9458-5929 OT Time Calculation (min): 30 min  Charges: OT General Charges $OT Visit: 1 Visit OT Treatments $Self Care/Home Management : 8-22 mins $Therapeutic Exercise: 8-22 mins  Kathie Dike, M.S. OTR/L  07/31/20, 1:35 PM  ascom [phone]

## 2020-08-01 DIAGNOSIS — R079 Chest pain, unspecified: Secondary | ICD-10-CM | POA: Diagnosis not present

## 2020-08-01 DIAGNOSIS — E871 Hypo-osmolality and hyponatremia: Secondary | ICD-10-CM

## 2020-08-01 DIAGNOSIS — B029 Zoster without complications: Secondary | ICD-10-CM | POA: Diagnosis not present

## 2020-08-01 DIAGNOSIS — R509 Fever, unspecified: Secondary | ICD-10-CM | POA: Diagnosis not present

## 2020-08-01 DIAGNOSIS — R519 Headache, unspecified: Secondary | ICD-10-CM | POA: Diagnosis not present

## 2020-08-01 LAB — CBC
HCT: 25.6 % — ABNORMAL LOW (ref 36.0–46.0)
Hemoglobin: 8.9 g/dL — ABNORMAL LOW (ref 12.0–15.0)
MCH: 32.7 pg (ref 26.0–34.0)
MCHC: 34.8 g/dL (ref 30.0–36.0)
MCV: 94.1 fL (ref 80.0–100.0)
Platelets: 295 10*3/uL (ref 150–400)
RBC: 2.72 MIL/uL — ABNORMAL LOW (ref 3.87–5.11)
RDW: 13.2 % (ref 11.5–15.5)
WBC: 7.5 10*3/uL (ref 4.0–10.5)
nRBC: 0 % (ref 0.0–0.2)

## 2020-08-01 LAB — URINE CULTURE: Culture: NO GROWTH

## 2020-08-01 LAB — BASIC METABOLIC PANEL
Anion gap: 10 (ref 5–15)
BUN: 24 mg/dL — ABNORMAL HIGH (ref 8–23)
CO2: 26 mmol/L (ref 22–32)
Calcium: 8.9 mg/dL (ref 8.9–10.3)
Chloride: 93 mmol/L — ABNORMAL LOW (ref 98–111)
Creatinine, Ser: 0.48 mg/dL (ref 0.44–1.00)
GFR calc Af Amer: >60 mL/min (ref >60)
GFR calc non Af Amer: >60 mL/min (ref >60)
Glucose, Bld: 165 mg/dL — ABNORMAL HIGH (ref 70–99)
Potassium: 4 mmol/L (ref 3.5–5.1)
Sodium: 129 mmol/L — ABNORMAL LOW (ref 135–145)

## 2020-08-01 LAB — URINALYSIS, COMPLETE (UACMP) WITH MICROSCOPIC
Bacteria, UA: NONE SEEN
Bilirubin Urine: NEGATIVE
Glucose, UA: NEGATIVE mg/dL
Hgb urine dipstick: NEGATIVE
Ketones, ur: NEGATIVE mg/dL
Leukocytes,Ua: NEGATIVE
Nitrite: NEGATIVE
Protein, ur: NEGATIVE mg/dL
Specific Gravity, Urine: 1.017 (ref 1.005–1.030)
Squamous Epithelial / HPF: NONE SEEN (ref 0–5)
pH: 7 (ref 5.0–8.0)

## 2020-08-01 LAB — GLUCOSE, CAPILLARY
Glucose-Capillary: 149 mg/dL — ABNORMAL HIGH (ref 70–99)
Glucose-Capillary: 159 mg/dL — ABNORMAL HIGH (ref 70–99)
Glucose-Capillary: 165 mg/dL — ABNORMAL HIGH (ref 70–99)
Glucose-Capillary: 167 mg/dL — ABNORMAL HIGH (ref 70–99)
Glucose-Capillary: 168 mg/dL — ABNORMAL HIGH (ref 70–99)
Glucose-Capillary: 168 mg/dL — ABNORMAL HIGH (ref 70–99)
Glucose-Capillary: 171 mg/dL — ABNORMAL HIGH (ref 70–99)

## 2020-08-01 MED ORDER — MELATONIN 5 MG PO TABS
5.0000 mg | ORAL_TABLET | Freq: Every day | ORAL | Status: DC
  Administered 2020-08-01 – 2020-08-23 (×19): 5 mg via ORAL
  Filled 2020-08-01 (×20): qty 1

## 2020-08-01 NOTE — Progress Notes (Signed)
ID   Date of Admission:  07/12/2020      ID: Casey Baldwin is a 79 y.o. female  Active Problems:   SIRS (systemic inflammatory response syndrome) (HCC)   Shingles rash   Delirium   Acute metabolic encephalopathy   Essential hypertension   Anemia   Acute lower UTI   Chronic diastolic CHF (congestive heart failure) (HCC)   Sepsis (HCC)   Change in mental status   Anorexia   Malnutrition of moderate degree    Subjective: None available  Medications:  . Chlorhexidine Gluconate Cloth  6 each Topical Daily  . docusate  100 mg Per Tube BID  . enoxaparin (LOVENOX) injection  40 mg Subcutaneous Q24H  . feeding supplement (KATE FARMS STANDARD 1.4)  325 mL Oral BID BM  . Ferrous Fumarate  1 tablet Oral BID  . free water  20 mL Per Tube Q6H  . insulin aspart  0-9 Units Subcutaneous Q4H  . lactulose  20 g Oral BID  . metoprolol tartrate  12.5 mg Per Tube BID  . multivitamin  15 mL Per Tube Daily  . pantoprazole sodium  40 mg Per Tube Daily  . rosuvastatin  20 mg Oral Daily  . sodium chloride flush  3 mL Intravenous Q12H  . tamsulosin  0.4 mg Oral QPC supper  . thiamine  100 mg Oral Daily    Objective: Vital signs in last 24 hours: Temp:  [98.7 F (37.1 C)-99.4 F (37.4 C)] 98.8 F (37.1 C) (09/22 1147) Pulse Rate:  [87-107] 97 (09/22 1147) Resp:  [15-20] 15 (09/22 1147) BP: (130-144)/(58-85) 141/85 (09/22 1147) SpO2:  [96 %-98 %] 97 % (09/22 1147)  PHYSICAL EXAM:  General: More Alert, cooperative, more verbal- still confused.  Head: Normocephalic, without obvious abnormality, atraumatic. Eyes: Conjunctivae clear, anicteric sclerae. Pupils are equal ENT Nares normal. No drainage or sinus tenderness. NG tube Lips, mucosa, and tongue normal. No Thrush Neck: Supple, symmetrical, no adenopathy, thyroid: non tender no carotid bruit and no JVD. Back: No CVA tenderness. Lungs: b/la ir entry  Heart: Regular rate and rhythm, no murmur, rub or gallop. Abdomen: Soft,  Lymph:  Cervical, supraclavicular normal. Neurologic:moves all extremities- left side better than right  Lab Results Recent Labs    07/30/20 0434 07/30/20 0434 07/31/20 0408 08/01/20 0524  WBC 8.8  --   --  7.5  HGB 9.2*  --   --  8.9*  HCT 27.6*  --   --  25.6*  NA 126*   < > 129* 129*  K 4.6   < > 4.1 4.0  CL 91*  --   --  93*  CO2 25  --   --  26  BUN 23  --   --  24*  CREATININE 0.47  --   --  0.48   < > = values in this interval not displayed.    ID  CSF- HSV, enterovirus neg VZV pcr Negative WNV IgG positive in csf, IgM neg Serum WNV IgG and IgM neg Lyme pending Nmdar neg  CSF culture NG so far -72hrs  BC -9/13, 9/6, 9/2 negative  Imaging MRI/MRA 9/14- negative MRI with contrast 9/2 negative   EEG -9/15- no seizures or epileptiform discharges  Assessment/Plan: Admitted on 9/12 with chest pain rt side, fever, headache and leg pain and apparently intermittent confusion as per daughter Found to have herpes zoster Thoracic dermatome  Herpes zoster with  aseptic meningitis on admission ( patient had refused LP)- was  started on IV acyclovir 10mg /Kg q 8. She was alert and verbal and appropriate on presentation but after 5-6 days gradual deterioration to obtundation. Seen by neuro- delirium was questioned -EEG on 07/20/20 no seizure activity..There were many reasons including changes in sleep rhythm, poor intake, constipation, not wearing CPAp ( but no co2 narcosis) hyponatremia for the obtundation On 9/14  Had LP which revealed lymphocytic pleocytosis So far bacterial culture neg and HSV DNA neg- VZV neg .west Nile virus IgG in csf was positive but not IgM and serum antibodies neg- so this is not acute WNV and  IgG is not a reliable  test for diagnosis of acute infection as there is cross reactivity with other flavi virus and dengue-     MRI and MRA done on 9/14 no acute changes. Daughter wanted her to be in 10/14, but no bed and hence Repeat EEG done on 07/25/20 no  seizure activity noted. 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 VZV PCR neg in csf which is the case in 60% of patients- asked lab to add IgG, IgM to the csf- If enough specimen available it will be done at [facility] lab   more alert and more appropriate response but not back to baseline   Day 20 of acyclovir. 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 This may reflect an abscess. It could be a pseudocyst if there is a history of pancreatitis. It may be a postoperative collection, since the patient underwent a cholecystectomy since the prior CT. Collection measures approximately 14 x 4 x 10 cm in size. repeat CT show the samecollection which is thought to be either a pseudocyst, seroma or biloma and not abscess IR does not recommend aspiration.  Complicated stay at [facility] between 04/01/20 until 05/14/20 for abdominal pain which was diagnosed as choledocholithiasis/cholangitis in a setting of prior RYGB in 2008. 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 and she was taken to [facility]/20 for diagnostic lap, robotic lysis of adhesions, transgastric ERCP and biliary sphincterotomy, cholecystectomy, partial gastrectomy andaccidentalcolotomy which was repaired.The immediate post op was complicated by post ERCP pancreatitis , pulmonary edema with acute hypoxic resp failure , Afib ( treated with amio and metoprolol), fever and leucocytosis suspected due to  pancreatitis and was treated with zosyn, UTI due to proteus and acute on chronic anemia needing PRBC and AKI.She was seen by gerontologist for insomnia and risk for delirium    H/o left TKA  Hearing loss  Discussed the management with her daughter

## 2020-08-01 NOTE — Progress Notes (Addendum)
Physical Therapy Treatment Patient Details Name: Casey Baldwin MRN: [id number] DOB: 1941/10/15 Today's Date: 08/01/2020    History of Present Illness Casey Baldwin is a 79 y.o. 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  Presented to ER secondary to generalized body aches, progressive weakness, headaches; admitted for management of SIRS (urine?), shingles and acute metabolic encephalopathy.    PT Comments    Pt was supine in bed with HOB elevated ~ 30 degrees and feeding running. Held feeding when [facility] lowed. Pt is disoriented but awake. Presents with lethargy mostly throughout session. Was agreeable to trial EOB sitting. +2 required for safety. Overall pt's cognition limits her more so than physical abilities. She did demonstrate abilities to maintain balance at EOB without assistance at times however does require min-mod at times also. Inconsistent due to lethargy and cognition deficits. Was able to demonstrate 3+/5 strength at EOB but unwilling to trial standing even with +2 assist. Will benefit from SNF at [place] to address deficits and improve safe functional mobility.    Follow Up Recommendations  SNF     Equipment Recommendations  Other (comment) (defer to next level of care)    Recommendations for Other Services       Precautions / Restrictions Precautions Precautions: Fall Restrictions Weight Bearing Restrictions: No    Mobility  Bed Mobility Overal bed mobility: Needs Assistance Bed Mobility: Supine to Sit;Sit to Supine     Supine to sit: Mod assist;+2 for safety/equipment;HOB elevated Sit to supine: Total assist;+2 for safety/equipment;HOB elevated   General bed mobility comments: Pt requires +2 assist to safely sit up EOB. does have active strength in BLEs and able to progress Les to EOB with increased time however requires +2 assist to safely get torso/Hips to EOB. Once seated EOB,  demonstrated 3+/5 strength in BLEs (quads) unwilling to attempt standing. Cognition/ participation slows progress moreso than strength/physical abilities.  Transfers        General transfer comment: pt unwilling      Balance Overall balance assessment: Needs assistance Sitting-balance support: Bilateral upper extremity supported;Feet supported Sitting balance-Leahy Scale: Fair Sitting balance - Comments: pt sitting balance is inconsistent. she is able to maintain balance at times without physical assistance however at times requires min-mod assist. pt attempts to return to supine several times while seated EOB prior to therapist requesting her to do so.          Cognition Arousal/Alertness: Lethargic Behavior During Therapy: Flat affect Overall Cognitive Status: Impaired/Different from baseline Area of Impairment: Attention;Memory;Following commands;Safety/judgement;Awareness;Problem solving                 Orientation Level: Disoriented to;Place;Time;Situation Current Attention Level: Focused Memory: Decreased recall of precautions;Decreased short-term memory Following Commands: Follows one step commands inconsistently Safety/Judgement: Decreased awareness of safety;Decreased awareness of deficits Awareness: Intellectual Problem Solving: Slow processing;Decreased initiation;Difficulty sequencing;Requires verbal cues;Requires tactile cues General Comments: Pt present with flat affect and poor cognition. Delayed responses and inconsistently follows one step commands. RN/MD entered room during session.      Exercises General Exercises - Lower Extremity Ankle Circles/Pumps: AROM;10 reps;Supine Long Arc Quad: AROM;5 reps;Both;Seated         PT Goals (current goals can now be found in the care plan section) Acute Rehab PT Goals Patient Stated Goal: none stated Progress towards PT goals: Progressing toward goals    Frequency    Min 2X/week      PT Plan  Current  plan remains appropriate       AM-PAC PT "6 Clicks" Mobility   Outcome Measure  Help needed turning from your back to your side while in a flat bed without using bedrails?: A Lot Help needed moving from lying on your back to sitting on the side of a flat bed without using bedrails?: Total Help needed moving to and from a bed to a chair (including a wheelchair)?: Total Help needed standing up from a chair using your arms (e.g., wheelchair or bedside chair)?: Total Help needed to walk in hospital room?: Total Help needed climbing 3-5 steps with a railing? : Total 6 Click Score: 7    End of Session   Activity Tolerance: Patient limited by fatigue;Patient limited by lethargy Patient left: in bed;with call bell/phone within reach;with bed alarm set;Other (comment);with SCD's reapplied Nurse Communication: Mobility status;Precautions PT Visit Diagnosis: Muscle weakness (generalized) (M62.81);Difficulty in walking, not elsewhere classified (R26.2);Other abnormalities of gait and mobility (R26.89)     Time: 5035-4656 PT Time Calculation (min) (ACUTE ONLY): 16 min  Charges:  $Therapeutic Activity: 8-22 mins                     Jetta Lout PTA 08/01/20, 12:17 PM

## 2020-08-01 NOTE — Progress Notes (Signed)
PROGRESS NOTE    Casey Baldwin  GEX:[id number] DOB: Sep 13, 1941 DOA: 07/12/2020 PCP: Lesly Rubenstein, MD   Assessment & Plan:   Active Problems:   SIRS (systemic inflammatory response syndrome) (HCC)   Shingles rash   Delirium   Acute metabolic encephalopathy   Essential hypertension   Anemia   Acute lower UTI   Chronic diastolic CHF (congestive heart failure) (HCC)   Sepsis (HCC)   Change in mental status   Anorexia   Malnutrition of moderate degree   Acute metabolic cephalopathy: etiology unclear. S/p LP on 9/14. MRA/MRI head no acute intracranial abnormalities. Abxs were d/c. EEGs were neg for seizures or epileptiform discharges. CSF neg for HSV, West Nile, autoimmune encephalitis, enterovirus. 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 Continue on acyclovir  Day# 20/21.   Shingles: w/ aseptic meningitis. Continue on acyclovir day# 20/21 as per ID   PSVT: will continue on metoprolol   Poor po intake: continue w/ tube feeds. Hx of gastric bypass, 02/09/20 at [facility] as per Dr. Marylu Lund. Speech re-consulted to assess swallowing   Hyponatremia: etiology unclear, possible SIADH.   Sepsis: secondary to UTI and viral infection. Completed abx course. Resolved  Intra-abdominal fluid collection: w/ CT showing fluid collection likely pseudocyst, seroma or biloma, not abscess. IR did not recommend aspiration   Urinary retention: continue w/ foley   UTI: completed abx course. 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 Pt does not answer anymore of my questions.   Objective: Vitals:   07/31/20 0721 07/31/20 1537 07/31/20 2108 08/01/20 0109  BP:  138/70 (!) 132/58 134/75 130/77  Pulse: 99 96 (!) 107 87  Resp: 16 17 20 18   Temp: (!) 97.4 F (36.3 C) 99.4 F (37.4 C) 99 F (37.2 C)   TempSrc: Oral Oral Oral   SpO2: 97% 98% 96% 97%  Weight:      Height:        Intake/Output Summary (Last 24 hours) at 08/01/2020 0740 Last data filed at 08/01/2020 0500 Gross per 24 hour  Intake 510 ml  Output 700 ml  Net -190 ml   Filed Weights   07/28/20 0412 07/29/20 0444 07/30/20 0500  Weight: 77.2 kg 76.2 kg 78.1 kg    Examination:  General exam: Appears lethargic  Respiratory system: Clear to auscultation. No rales, rhonchi  Cardiovascular system: S1 & S2 +. No  rubs, gallops or clicks.  Gastrointestinal system: Abdomen is nondistended, soft and nontender. Hypoactive bowel sounds heard. Central nervous system: Lethargic. Moves all 4 extremities  Psychiatry: Judgement and insight appear abnormal. Flat mood and and affect.     Data Reviewed: I have personally reviewed following labs and imaging studies  CBC: Recent Labs  Lab 07/30/20 0434 08/01/20 0524  WBC 8.8 7.5  HGB 9.2* 8.9*  HCT 27.6* 25.6*  MCV 96.8 94.1  PLT 278 295   Basic Metabolic Panel: Recent Labs  Lab 07/26/20 0946 07/26/20 0946 07/27/20 0412 07/27/20 0412 07/28/20 0902 07/29/20 0313 07/30/20 0434 07/31/20 0408 08/01/20 0524  NA 125*   < > 128*   < > 127* 126* 126* 129* 129*  K 3.9   < > 4.0  --  4.3  --  4.6 4.1 4.0  CL 90*  --  91*  --  92*  --  91*  --  93*  CO2 26  --  29  --  26  --  25  --  26  GLUCOSE 173*  --  152*  --  178*  --  184*  --  165*  BUN 14  --  20  --  23  --  23  --  24*  CREATININE 0.62  --  0.65  --  0.62  --  0.47  --  0.48  CALCIUM 8.6*  --  8.6*  --  8.5*  --  8.7*  --  8.9   < > = values in this interval not displayed.   GFR: Estimated Creatinine Clearance: 56.8 mL/min (by C-G formula based on SCr of 0.48 mg/dL). Liver Function Tests: No results for input(s): AST, ALT, ALKPHOS, BILITOT, PROT, ALBUMIN in the last  168 hours. No results for input(s): LIPASE, AMYLASE in the last 168 hours. No results for input(s): AMMONIA in the last 168 hours. Coagulation Profile: No results for input(s): INR, PROTIME in the last 168 hours. Cardiac Enzymes: No results for input(s): CKTOTAL, CKMB, CKMBINDEX, TROPONINI in the last 168 hours. BNP (last 3 results) No results for input(s): PROBNP in the last 8760 hours. HbA1C: No results for input(s): HGBA1C in the last 72 hours. CBG: Recent Labs  Lab 07/31/20 1146 07/31/20 1647 07/31/20 2031 08/01/20 0002 08/01/20 0426  GLUCAP 179* 146* 208* 159* 168*   Lipid Profile: No results for input(s): CHOL, HDL, LDLCALC, TRIG, CHOLHDL, LDLDIRECT in the last 72 hours. Thyroid Function Tests: No results for input(s): TSH, T4TOTAL, FREET4, T3FREE, THYROIDAB in the last 72 hours. Anemia Panel: No results for input(s): VITAMINB12, FOLATE, FERRITIN, TIBC, IRON, RETICCTPCT in the last 72 hours. Sepsis Labs: No results for input(s): PROCALCITON, LATICACIDVEN in the last 168 hours.  Recent Results (from the past 240 hour(s))  CULTURE, BLOOD (ROUTINE X 2) w Reflex to ID Panel     Status: None   Collection Time: 07/23/20  7:46 PM   Specimen: BLOOD  Result Value Ref Range Status   Specimen Description BLOOD LEFT [facility]  Final   Special Requests   Final    BOTTLES DRAWN AEROBIC AND ANAEROBIC Blood Culture adequate volume   Culture   Final    NO GROWTH 5 DAYS Performed at [facility], [address] [place], [place] [address]    Report Status 07/28/2020 FINAL  Final  CULTURE, BLOOD (ROUTINE X 2) w Reflex to ID Panel     Status: None   Collection Time: 07/23/20  7:47 PM   Specimen: BLOOD  Result Value Ref Range Status   Specimen Description BLOOD RIGHT [facility]  Final   Special Requests   Final    BOTTLES DRAWN AEROBIC AND ANAEROBIC Blood Culture adequate volume   Culture   Final    NO GROWTH 5 DAYS Performed at [facility], [address],  [place], [place] [address]    Report Status 07/28/2020 FINAL  Final  Culture, fungus without smear     Status: None (Preliminary result)   Collection Time: 07/24/20  1:15 PM   Specimen: CSF; Cerebrospinal Fluid  Result Value Ref Range Status   Specimen Description   Final    CSF Performed at [facility], [address], [place], [place] [address]    Special Requests   Final  Normal Performed at [facility], [address], [place], [place] [address]    Culture   Final    NO FUNGUS ISOLATED AFTER 7 DAYS Performed at [facility], [address], [place], [place] [address]    Report Status PENDING  Incomplete  Anaerobic culture     Status: None   Collection Time: 07/24/20  1:15 PM   Specimen: CSF; Cerebrospinal Fluid  Result Value Ref Range Status   Specimen Description   Final    CSF Performed at [facility], [address], [place], [place] [address]    Special Requests   Final    Normal Performed at [facility], [address] [place], [place] [address]    Culture   Final    NO ANAEROBES ISOLATED Performed at [facility], [address], [place], [place] [address]    Report Status 07/29/2020 FINAL  Final  CSF culture     Status: None   Collection Time: 07/24/20  1:15 PM   Specimen: PATH Cytology CSF; Cerebrospinal Fluid  Result Value Ref Range Status   Specimen Description   Final    CSF Performed at [facility], [address], [place], [place] [address]    Special Requests   Final    NONE Performed at [facility], [address], [place], [place] [address]    Gram Stain   Final    WBC SEEN NO ORGANISMS SEEN Performed at [facility], [address] [place], [place] [address]    Culture   Final    NO GROWTH Performed at [facility], [address] [place], [place] [address]    Report Status 07/28/2020 FINAL  Final         Radiology Studies: No results  found.      Scheduled Meds: . Chlorhexidine Gluconate Cloth  6 each Topical Daily  . docusate  100 mg Per Tube BID  . enoxaparin (LOVENOX) injection  40 mg Subcutaneous Q24H  . feeding supplement (KATE FARMS STANDARD 1.4)  325 mL Oral BID BM  . Ferrous Fumarate  1 tablet Oral BID  . free water  20 mL Per Tube Q6H  . insulin aspart  0-9 Units Subcutaneous Q4H  . lactulose  20 g Oral BID  . metoprolol tartrate  12.5 mg Per Tube BID  . multivitamin  15 mL Per Tube Daily  . pantoprazole sodium  40 mg Per Tube Daily  . rosuvastatin  20 mg Oral Daily  . sodium chloride flush  3 mL Intravenous Q12H  . tamsulosin  0.4 mg Oral QPC supper  . thiamine  100 mg Oral Daily   Continuous Infusions: . sodium chloride 10 mL/hr at 07/30/20 0400  . acyclovir 750 mg (08/01/20 0610)  . feeding supplement (VITAL AF 1.2 CAL) 60 mL/hr at 07/30/20 0400     LOS: 19 days    Time spent: 33 mins    Charise Killian, MD [facility] Pager [phone]  If 7PM-7AM, please contact night-coverage [email or web address] 08/01/2020, 7:40 AM

## 2020-08-01 NOTE — Evaluation (Signed)
Clinical/Bedside Swallow Evaluation Patient Details  Name: Casey Baldwin MRN: [id number] Date of Birth: [birth year]  Today's Date: 08/01/2020 Time: SLP Start Time (ACUTE ONLY): 1610 SLP Stop Time (ACUTE ONLY): 1710 SLP Time Calculation (min) (ACUTE ONLY): 60 min  Past Medical History:  Past Medical History:  Diagnosis Date   Coronary artery disease    High cholesterol    Hypertension    Past Surgical History:  Past Surgical History:  Procedure Laterality Date   ABDOMINAL HYSTERECTOMY     COLON RESECTION     FACIAL COSMETIC SURGERY     FRACTURE SURGERY     left leg and left ankle.   GASTRIC BYPASS     left knee replacement     HPI:  Pt is a 78 y.o. 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  Presented to ER secondary to generalized body aches, progressive weakness, headaches; admitted for management of SIRS (urine?), shingles and acute metabolic encephalopathy.  Pt currently has a Dobhoff in place w/ continuous TFs running.  Daughter stated pt has h/o Gastric Bypass w/ baseline of small meals but no reports of difficulty swallowing.    Assessment / Plan / Recommendation Clinical Impression  Pt appears to present w/ grossly functional oropharyngeal swallow function; no overt Neuromuscular deficits noted. Pt does have a Dobhoff feeding tube in place and is on continuous TFs for nutritional support d/t reduced oral intake, Delirium/Cognitive decline since admit. She appears at reduced risk for aspiration when monitored w/ all oral intake for swallowing/clearing and when following general aspiration precautions. No immediate, overt clinical s/s of aspiration noted. Pt took few sips and bites of thin liquids and puree w/ no oropharyngeal phase dysphagia apparent. She has reduced desire for po's suspect sec. to continuous TFs and baseline gastric bypass; Cognitive decline impacts as well. A pt is  at increased risk for choking IF food cannot fully pass through the UES, adheres to the dobhoff/NG, then drops/slips into the airway. Would recommend more of a Full Liquid diet consistency, but pt's Dtr stated she has been doing "OK" w/ the soft, mashed foods on her regular diet currently mostly veggies and potatoes -- she only takes a few bites. SLP Visit Diagnosis: Dysphagia, unspecified (R13.10)    Aspiration Risk  Mild aspiration risk;Risk for inadequate nutrition/hydration    Diet Recommendation  Dysphagia level 2 (minced foods moistened well) w/ Thin liquids. Strict REFLUX precautions d/t h/o gastric bypass per Dtr; general aspiration precautions. Support w/ feeding at all meals d/t Cognitive decline  Medication Administration: Crushed with puree (for safer swallowing)    Other  Recommendations Recommended Consults:  (dietician is following) Oral Care Recommendations: Oral care BID;Oral care before and after PO;Staff/trained caregiver to provide oral care Other Recommendations:  (n/a)   Follow up Recommendations None      Frequency and Duration  (n/a)   (n/a)       Prognosis Prognosis for Safe Diet Advancement: Fair Barriers to Reach Goals: Cognitive deficits;Time post onset;Severity of deficits;Behavior      Swallow Study   General Date of Onset: 07/09/20 HPI: Pt is a 79 y.o. 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  Presented to ER secondary to generalized body aches, progressive weakness, headaches; admitted for management of SIRS (urine?), shingles and acute metabolic encephalopathy.  Pt currently has a Dobhoff in place w/ continuous  TFs running.  Daughter stated pt has h/o Gastric Bypass w/ baseline of small meals but no reports of difficulty swallowing.  Type of Study: Bedside Swallow Evaluation Previous Swallow Assessment: none Diet Prior to this Study: Regular;Thin  liquids Temperature Spikes Noted: No (wbc 7.5) Respiratory Status: Room air History of Recent Intubation: No Behavior/Cognition: Alert;Cooperative;Pleasant mood;Confused;Distractible;Requires cueing (Delirium per Dtr. ) Oral Cavity Assessment: Within Functional Limits Oral Care Completed by SLP: Recent completion by staff Oral Cavity - Dentition: Adequate natural dentition Vision: Functional for self-feeding Self-Feeding Abilities: Able to feed self;Needs assist;Needs set up;Total assist (able to hold cup to drink w/ support) Patient Positioning: Upright in bed (needed complete support) Baseline Vocal Quality: Normal;Low vocal intensity (min) Volitional Cough: Cognitively unable to elicit Volitional Swallow: Unable to elicit    Oral/Motor/Sensory Function Overall Oral Motor/Sensory Function: Within functional limits (w/ bolus management and lingual protrusion)   Ice Chips Ice chips: Not tested   Thin Liquid Thin Liquid: Within functional limits Presentation: Self Fed;Straw (supported cup w/ pt; 3 trials)    Nectar Thick Nectar Thick Liquid: Not tested   Honey Thick Honey Thick Liquid: Not tested   Puree Puree: Within functional limits Presentation: Spoon (fed; 3 trials)   Solid     Solid: Not tested Other Comments: dobhoff in place        Casey Som, MS, [employer] [facility] [phone] Casey Baldwin 08/01/2020,5:20 PM

## 2020-08-01 NOTE — Progress Notes (Addendum)
[facility]  ROUNDING NOTE   Subjective:   Patient found in the room, with physical therapists attending. She  is awake and alert, confused, answers some questions by nodding her head or by whispering 'yes' or 'no'.  Objective:  Vital signs in last 24 hours:  Temp:  [98.7 F (37.1 C)-99.4 F (37.4 C)] 98.7 F (37.1 C) (09/22 0740) Pulse Rate:  [87-107] 99 (09/22 0740) Resp:  [15-20] 15 (09/22 0740) BP: (130-144)/(58-84) 144/84 (09/22 0740) SpO2:  [96 %-98 %] 97 % (09/22 0740)  Weight change:  Filed Weights   07/28/20 0412 07/29/20 0444 07/30/20 0500  Weight: 77.2 kg 76.2 kg 78.1 kg    Intake/Output: I/O last 3 completed shifts: In: 2042 [P.O.:120; NG/GT:1262; IV Piggyback:660] Out: 975 [Urine:975]   Intake/Output this shift:  No intake/output data recorded.  Physical Exam: General: In no acute distress  Head: NG tube in place   Eyes:  Anicteric  Neck:  Supple  Lungs:  Clear bilaterally  Heart: Regular, S1S2+  Abdomen:  Soft, Non distended, non tender  Extremities:  No peripheral edema.  Neurologic: Awake. Alert, unable to follow commands, confused  Skin: No acute rashes or lesions        Basic Metabolic Panel: Recent Labs  Lab 07/26/20 0946 07/26/20 0946 07/27/20 0412 07/27/20 0412 07/28/20 0902 07/29/20 0313 07/30/20 0434 07/31/20 0408 08/01/20 0524  NA 125*   < > 128*   < > 127* 126* 126* 129* 129*  K 3.9   < > 4.0  --  4.3  --  4.6 4.1 4.0  CL 90*  --  91*  --  92*  --  91*  --  93*  CO2 26  --  29  --  26  --  25  --  26  GLUCOSE 173*  --  152*  --  178*  --  184*  --  165*  BUN 14  --  20  --  23  --  23  --  24*  CREATININE 0.62  --  0.65  --  0.62  --  0.47  --  0.48  CALCIUM 8.6*   < > 8.6*   < > 8.5*  --  8.7*  --  8.9   < > = values in this interval not displayed.    Liver Function Tests: No results for input(s): AST, ALT, ALKPHOS, BILITOT, PROT, ALBUMIN in the last 168 hours. No results for input(s): LIPASE, AMYLASE in  the last 168 hours. No results for input(s): AMMONIA in the last 168 hours.  CBC: Recent Labs  Lab 07/30/20 0434 08/01/20 0524  WBC 8.8 7.5  HGB 9.2* 8.9*  HCT 27.6* 25.6*  MCV 96.8 94.1  PLT 278 295    Cardiac Enzymes: No results for input(s): CKTOTAL, CKMB, CKMBINDEX, TROPONINI in the last 168 hours.  BNP: Invalid input(s): POCBNP  CBG: Recent Labs  Lab 07/31/20 1647 07/31/20 2031 08/01/20 0002 08/01/20 0426 08/01/20 0742  GLUCAP 146* 208* 159* 168* 167*    Microbiology: Results for orders placed or performed during the hospital encounter of 07/12/20  Urine culture     Status: Abnormal   Collection Time: 07/12/20  9:23 PM   Specimen: Urine, Random  Result Value Ref Range Status   Specimen Description   Final    URINE, RANDOM Performed at [facility], [address], [place], [place] [address]    Special Requests   Final    NONE Performed at [facility], [address] [place], [place] [address]    Culture MULTIPLE SPECIES PRESENT, SUGGEST RECOLLECTION (A)  Final   Report Status 07/15/2020 FINAL  Final  Culture, blood (routine x 2)     Status: None   Collection Time: 07/12/20  9:23 PM   Specimen: BLOOD  Result Value Ref Range Status   Specimen Description BLOOD RIGHT AC  Final   Special Requests   Final    BOTTLES DRAWN AEROBIC AND ANAEROBIC Blood Culture results may not be optimal due to an excessive volume of blood received in culture bottles   Culture   Final    NO GROWTH 5 DAYS Performed at [facility], [address], [place], [place] [address]    Report Status 07/17/2020 FINAL  Final  Culture, blood (routine x 2)     Status: None   Collection Time: 07/12/20  9:23 PM   Specimen: BLOOD  Result Value Ref Range Status   Specimen Description BLOOD RIGHT WRIST  Final   Special Requests   Final    BOTTLES DRAWN AEROBIC AND ANAEROBIC Blood Culture adequate volume   Culture   Final    NO GROWTH 5 DAYS Performed at  [facility], [address] [place], [place] [address]    Report Status 07/17/2020 FINAL  Final  SARS Coronavirus 2 by RT PCR (hospital order, performed in [facility] hospital lab) Nasopharyngeal Nasopharyngeal Swab     Status: None   Collection Time: 07/12/20 11:13 PM   Specimen: Nasopharyngeal Swab  Result Value Ref Range Status   SARS Coronavirus 2 NEGATIVE NEGATIVE Final    Comment: (NOTE) SARS-CoV-2 target nucleic acids are NOT DETECTED.  The SARS-CoV-2 RNA is generally detectable in upper and lower respiratory specimens during the acute phase of infection. The lowest concentration of SARS-CoV-2 viral copies this assay can detect is 250 copies / mL. A negative result does not preclude SARS-CoV-2 infection and should not be used as the sole basis for treatment or other patient management decisions.  A negative result may occur with improper specimen collection / handling, submission of specimen other than nasopharyngeal swab, presence of viral mutation(s) within the areas targeted by this assay, and inadequate number of viral copies (<250 copies / mL). A negative result must be combined with clinical observations, patient history, and epidemiological information.  Fact Sheet for Patients:   BoilerBrush.com.cy  Fact Sheet for Healthcare Providers: https://pope.com/  This test is not yet approved or  cleared by the [place] FDA and has been authorized for detection and/or diagnosis of SARS-CoV-2 by FDA under an Emergency Use Authorization (EUA).  This EUA will remain in effect (meaning this test can be used) for the duration of the COVID-19 declaration under Section 564(b)(1) of the Act, 21 U.S.C. section 360bbb-3(b)(1), unless the authorization is terminated or revoked sooner.  Performed at [facility], [address] [place], [place] [address]   MRSA PCR Screening     Status: None   Collection  Time: 07/13/20  2:02 AM   Specimen: Nasal Mucosa; Nasopharyngeal  Result Value Ref Range Status   MRSA by PCR NEGATIVE NEGATIVE Final    Comment:        The GeneXpert MRSA Assay (FDA approved for NASAL specimens only), is one component of a comprehensive MRSA colonization surveillance program. It is not intended to diagnose MRSA infection nor to guide or monitor treatment for MRSA infections. Performed at [facility], [address] [place],  [place] [address]   CULTURE, BLOOD (ROUTINE X 2) w Reflex to ID Panel     Status: None   Collection Time: 07/16/20  5:36 PM   Specimen: BLOOD  Result Value Ref Range Status   Specimen Description BLOOD [facility]  Final   Special Requests   Final    BOTTLES DRAWN AEROBIC ONLY Blood Culture adequate volume   Culture   Final    NO GROWTH 5 DAYS Performed at [facility], [address] [place], [place] [address]    Report Status 07/21/2020 FINAL  Final  CULTURE, BLOOD (ROUTINE X 2) w Reflex to ID Panel     Status: None   Collection Time: 07/16/20  5:36 PM   Specimen: BLOOD  Result Value Ref Range Status   Specimen Description BLOOD [facility]  Final   Special Requests   Final    BOTTLES DRAWN AEROBIC AND ANAEROBIC Blood Culture adequate volume   Culture   Final    NO GROWTH 5 DAYS Performed at [facility], [address] [place], [place] [address]    Report Status 07/21/2020 FINAL  Final  CULTURE, BLOOD (ROUTINE X 2) w Reflex to ID Panel     Status: None   Collection Time: 07/23/20  7:46 PM   Specimen: BLOOD  Result Value Ref Range Status   Specimen Description BLOOD LEFT [facility]  Final   Special Requests   Final    BOTTLES DRAWN AEROBIC AND ANAEROBIC Blood Culture adequate volume   Culture   Final    NO GROWTH 5 DAYS Performed at [facility], [address] [place], [place] [address]    Report Status 07/28/2020 FINAL  Final  CULTURE, BLOOD (ROUTINE X 2) w Reflex to ID Panel     Status: None    Collection Time: 07/23/20  7:47 PM   Specimen: BLOOD  Result Value Ref Range Status   Specimen Description BLOOD RIGHT [facility]  Final   Special Requests   Final    BOTTLES DRAWN AEROBIC AND ANAEROBIC Blood Culture adequate volume   Culture   Final    NO GROWTH 5 DAYS Performed at [facility], [address] [place], [place] [address]    Report Status 07/28/2020 FINAL  Final  Culture, fungus without smear     Status: None (Preliminary result)   Collection Time: 07/24/20  1:15 PM   Specimen: CSF; Cerebrospinal Fluid  Result Value Ref Range Status   Specimen Description   Final    CSF Performed at [facility], [address], [place], [place] [address]    Special Requests   Final    Normal Performed at [facility], [address] [place], [place] [address]    Culture   Final    NO FUNGUS ISOLATED AFTER 7 DAYS Performed at [facility], [address], [place], [place] [address]    Report Status PENDING  Incomplete  Anaerobic culture     Status: None   Collection Time: 07/24/20  1:15 PM   Specimen: CSF; Cerebrospinal Fluid  Result Value Ref Range Status   Specimen Description   Final    CSF Performed at [facility], [address] [place], [place] [address]    Special Requests   Final    Normal Performed at [facility], [address] [place], [place] [address]    Culture   Final    NO ANAEROBES ISOLATED Performed at [facility], [address] [place], [place] [address]    Report Status 07/29/2020 FINAL  Final  CSF culture     Status: None   Collection Time: 07/24/20  1:15 PM   Specimen: PATH Cytology CSF; Cerebrospinal Fluid  Result Value Ref Range Status   Specimen Description   Final    CSF Performed at [facility], [address] [place], [place] [address]    Special Requests   Final    NONE Performed at [facility], [address] [place], [place] [address]    Gram  Stain   Final    WBC SEEN NO ORGANISMS SEEN Performed at [facility], [address] [place], [place] [address]    Culture   Final    NO GROWTH Performed at [facility], [address] [place], [place] [address]    Report Status 07/28/2020 FINAL  Final    Coagulation Studies: No results for input(s): LABPROT, INR in the last 72 hours.  Urinalysis: Recent Labs    07/31/20 2326  COLORURINE YELLOW*  LABSPEC 1.017  PHURINE 7.0  GLUCOSEU NEGATIVE  HGBUR NEGATIVE  BILIRUBINUR NEGATIVE  KETONESUR NEGATIVE  PROTEINUR NEGATIVE  NITRITE NEGATIVE  LEUKOCYTESUR NEGATIVE      Imaging: No results found.   Medications:   . sodium chloride 10 mL/hr at 07/30/20 0400  . acyclovir 750 mg (08/01/20 0610)  . feeding supplement (VITAL AF 1.2 CAL) 60 mL/hr at 07/30/20 0400   . Chlorhexidine Gluconate Cloth  6 each Topical Daily  . docusate  100 mg Per Tube BID  . enoxaparin (LOVENOX) injection  40 mg Subcutaneous Q24H  . feeding supplement (KATE FARMS STANDARD 1.4)  325 mL Oral BID BM  . Ferrous Fumarate  1 tablet Oral BID  . free water  20 mL Per Tube Q6H  . insulin aspart  0-9 Units Subcutaneous Q4H  . lactulose  20 g Oral BID  . metoprolol tartrate  12.5 mg Per Tube BID  . multivitamin  15 mL Per Tube Daily  . pantoprazole sodium  40 mg Per Tube Daily  . rosuvastatin  20 mg Oral Daily  . sodium chloride flush  3 mL Intravenous Q12H  . tamsulosin  0.4 mg Oral QPC supper  . thiamine  100 mg Oral Daily   sodium chloride, acetaminophen **OR** acetaminophen, haloperidol lactate, liver oil-zinc oxide, ondansetron **OR** ondansetron (ZOFRAN) IV  Assessment/ Plan:  Ms. Celesta GentileBrenda Helvie is a 79 y.o. 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, diabetes mellitus type 2, morbid obesity who was admitted to [facility] on 07/12/2020 for Transient alteration of  awareness [R40.4] RUQ pain [R10.11] Abnormal CT scan [R93.89] Change in mental status [R41.82] SIRS (systemic inflammatory response syndrome) (HCC) [R65.10] Fever, unspecified fever cause [R50.9] Urinary tract infection with hematuria, site unspecified [N39.0, R31.9] Edema of abdominal wall [R60.0]  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 Above plan was discussed and agreed upon on the signing of this note.   Lamont DowdySarath Havoc Sanluis, MD [facility]  9/22/202112:59 PM

## 2020-08-02 LAB — CBC
HCT: 27.8 % — ABNORMAL LOW (ref 36.0–46.0)
Hemoglobin: 9.4 g/dL — ABNORMAL LOW (ref 12.0–15.0)
MCH: 32.9 pg (ref 26.0–34.0)
MCHC: 33.8 g/dL (ref 30.0–36.0)
MCV: 97.2 fL (ref 80.0–100.0)
Platelets: 327 10*3/uL (ref 150–400)
RBC: 2.86 MIL/uL — ABNORMAL LOW (ref 3.87–5.11)
RDW: 13.3 % (ref 11.5–15.5)
WBC: 8.1 10*3/uL (ref 4.0–10.5)
nRBC: 0 % (ref 0.0–0.2)

## 2020-08-02 LAB — BASIC METABOLIC PANEL
Anion gap: 9 (ref 5–15)
BUN: 26 mg/dL — ABNORMAL HIGH (ref 8–23)
CO2: 26 mmol/L (ref 22–32)
Calcium: 9.1 mg/dL (ref 8.9–10.3)
Chloride: 91 mmol/L — ABNORMAL LOW (ref 98–111)
Creatinine, Ser: 0.67 mg/dL (ref 0.44–1.00)
GFR calc Af Amer: >60 mL/min (ref >60)
GFR calc non Af Amer: >60 mL/min (ref >60)
Glucose, Bld: 183 mg/dL — ABNORMAL HIGH (ref 70–99)
Potassium: 4.5 mmol/L (ref 3.5–5.1)
Sodium: 126 mmol/L — ABNORMAL LOW (ref 135–145)

## 2020-08-02 LAB — GLUCOSE, CAPILLARY
Glucose-Capillary: 191 mg/dL — ABNORMAL HIGH (ref 70–99)
Glucose-Capillary: 199 mg/dL — ABNORMAL HIGH (ref 70–99)
Glucose-Capillary: 144 mg/dL — ABNORMAL HIGH (ref 70–99)
Glucose-Capillary: 173 mg/dL — ABNORMAL HIGH (ref 70–99)
Glucose-Capillary: 169 mg/dL — ABNORMAL HIGH (ref 70–99)

## 2020-08-02 MED ORDER — SODIUM CHLORIDE 0.9 % IV SOLN: INTRAVENOUS | Status: DC

## 2020-08-02 NOTE — Progress Notes (Addendum)
[facility]  ROUNDING NOTE   Subjective:   Patient more alert, answering simple questions appropriately,sister at bedside. Appetite and oral intake stays poor. NGT feeds continuing.  Objective:  Vital signs in last 24 hours:  Temp:  [97.7 F (36.5 C)-99.7 F (37.6 C)] 98.9 F (37.2 C) (09/23 1149) Pulse Rate:  [86-101] 86 (09/23 1149) Resp:  [16-19] 16 (09/23 1149) BP: (127-146)/(61-74) 146/72 (09/23 1149) SpO2:  [96 %-98 %] 98 % (09/23 1149)  Weight change:  Filed Weights   07/28/20 0412 07/29/20 0444 07/30/20 0500  Weight: 77.2 kg 76.2 kg 78.1 kg    Intake/Output: I/O last 3 completed shifts: In: 1532 [NG/GT:1202; IV Piggyback:330] Out: 2450 [Urine:2450]   Intake/Output this shift:  Total I/O In: -  Out: 450 [Urine:450]  Physical Exam: General: Awake,alert, pleasant  Head: Normocephalic,NG tube in place with tube feeds  Eyes:  Sclerae and conjunctivae clear  Neck:  Supple  Lungs:  Respiration even, unlabored, Lungs clear bilaterally  Heart: Regular, S1S2+  Abdomen:  Soft, Non distended, non tender  Extremities:  No peripheral edema.  Neurologic: Awake. Alert, following commands  Skin: No acute rashes or lesions        Basic Metabolic Panel: Recent Labs  Lab 07/27/20 0412 07/27/20 0412 07/28/20 0902 07/28/20 0902 07/29/20 0313 07/30/20 0434 07/31/20 0408 08/01/20 0524 08/02/20 0433  NA 128*   < > 127*   < > 126* 126* 129* 129* 126*  K 4.0   < > 4.3  --   --  4.6 4.1 4.0 4.5  CL 91*  --  92*  --   --  91*  --  93* 91*  CO2 29  --  26  --   --  25  --  26 26  GLUCOSE 152*  --  178*  --   --  184*  --  165* 183*  BUN 20  --  23  --   --  23  --  24* 26*  CREATININE 0.65  --  0.62  --   --  0.47  --  0.48 0.67  CALCIUM 8.6*   < > 8.5*   < >  --  8.7*  --  8.9 9.1   < > = values in this interval not displayed.    Liver Function Tests: No results for input(s): AST, ALT, ALKPHOS, BILITOT, PROT, ALBUMIN in the last 168 hours. No results  for input(s): LIPASE, AMYLASE in the last 168 hours. No results for input(s): AMMONIA in the last 168 hours.  CBC: Recent Labs  Lab 07/30/20 0434 08/01/20 0524 08/02/20 0433  WBC 8.8 7.5 8.1  HGB 9.2* 8.9* 9.4*  HCT 27.6* 25.6* 27.8*  MCV 96.8 94.1 97.2  PLT 278 295 327    Cardiac Enzymes: No results for input(s): CKTOTAL, CKMB, CKMBINDEX, TROPONINI in the last 168 hours.  BNP: Invalid input(s): POCBNP  CBG: Recent Labs  Lab 08/01/20 2025 08/01/20 2344 08/02/20 0448 08/02/20 0819 08/02/20 1151  GLUCAP 168* 171* 191* 199* 144*    Microbiology: Results for orders placed or performed during the hospital encounter of 07/12/20  Urine culture     Status: Abnormal   Collection Time: 07/12/20  9:23 PM   Specimen: Urine, Random  Result Value Ref Range Status   Specimen Description   Final    URINE, RANDOM Performed at [facility], [address] [place], [place] [address]    Special Requests   Final    NONE  Performed at [facility], [address] [place], [place] [address]    Culture MULTIPLE SPECIES PRESENT, SUGGEST RECOLLECTION (A)  Final   Report Status 07/15/2020 FINAL  Final  Culture, blood (routine x 2)     Status: None   Collection Time: 07/12/20  9:23 PM   Specimen: BLOOD  Result Value Ref Range Status   Specimen Description BLOOD RIGHT AC  Final   Special Requests   Final    BOTTLES DRAWN AEROBIC AND ANAEROBIC Blood Culture results may not be optimal due to an excessive volume of blood received in culture bottles   Culture   Final    NO GROWTH 5 DAYS Performed at [facility], [address], [place], [place] [address]    Report Status 07/17/2020 FINAL  Final  Culture, blood (routine x 2)     Status: None   Collection Time: 07/12/20  9:23 PM   Specimen: BLOOD  Result Value Ref Range Status   Specimen Description BLOOD RIGHT WRIST  Final   Special Requests   Final    BOTTLES DRAWN AEROBIC AND ANAEROBIC Blood Culture  adequate volume   Culture   Final    NO GROWTH 5 DAYS Performed at [facility], [address], [place], [place] [address]    Report Status 07/17/2020 FINAL  Final  SARS Coronavirus 2 by RT PCR (hospital order, performed in [facility] hospital lab) Nasopharyngeal Nasopharyngeal Swab     Status: None   Collection Time: 07/12/20 11:13 PM   Specimen: Nasopharyngeal Swab  Result Value Ref Range Status   SARS Coronavirus 2 NEGATIVE NEGATIVE Final    Comment: (NOTE) SARS-CoV-2 target nucleic acids are NOT DETECTED.  The SARS-CoV-2 RNA is generally detectable in upper and lower respiratory specimens during the acute phase of infection. The lowest concentration of SARS-CoV-2 viral copies this assay can detect is 250 copies / mL. A negative result does not preclude SARS-CoV-2 infection and should not be used as the sole basis for treatment or other patient management decisions.  A negative result may occur with improper specimen collection / handling, submission of specimen other than nasopharyngeal swab, presence of viral mutation(s) within the areas targeted by this assay, and inadequate number of viral copies (<250 copies / mL). A negative result must be combined with clinical observations, patient history, and epidemiological information.  Fact Sheet for Patients:   BoilerBrush.com.cy  Fact Sheet for Healthcare Providers: https://pope.com/  This test is not yet approved or  cleared by the [place] FDA and has been authorized for detection and/or diagnosis of SARS-CoV-2 by FDA under an Emergency Use Authorization (EUA).  This EUA will remain in effect (meaning this test can be used) for the duration of the COVID-19 declaration under Section 564(b)(1) of the Act, 21 U.S.C. section 360bbb-3(b)(1), unless the authorization is terminated or revoked sooner.  Performed at [facility], [address]  [place], [place] [address]   MRSA PCR Screening     Status: None   Collection Time: 07/13/20  2:02 AM   Specimen: Nasal Mucosa; Nasopharyngeal  Result Value Ref Range Status   MRSA by PCR NEGATIVE NEGATIVE Final    Comment:        The GeneXpert MRSA Assay (FDA approved for NASAL specimens only), is one component of a comprehensive MRSA colonization surveillance program. It is not intended to diagnose MRSA infection nor to guide or monitor treatment for MRSA infections. Performed at [facility], [address] [place], [place] [address]   CULTURE, BLOOD (ROUTINE X 2) w Reflex to ID Panel     Status: None   Collection Time: 07/16/20  5:36 PM   Specimen: BLOOD  Result Value Ref Range Status   Specimen Description BLOOD [facility]  Final   Special Requests   Final    BOTTLES DRAWN AEROBIC ONLY Blood Culture adequate volume   Culture   Final    NO GROWTH 5 DAYS Performed at [facility], [address] [place], [place] [address]    Report Status 07/21/2020 FINAL  Final  CULTURE, BLOOD (ROUTINE X 2) w Reflex to ID Panel     Status: None   Collection Time: 07/16/20  5:36 PM   Specimen: BLOOD  Result Value Ref Range Status   Specimen Description BLOOD [facility]  Final   Special Requests   Final    BOTTLES DRAWN AEROBIC AND ANAEROBIC Blood Culture adequate volume   Culture   Final    NO GROWTH 5 DAYS Performed at [facility], [address], [place], [place] [address]    Report Status 07/21/2020 FINAL  Final  CULTURE, BLOOD (ROUTINE X 2) w Reflex to ID Panel     Status: None   Collection Time: 07/23/20  7:46 PM   Specimen: BLOOD  Result Value Ref Range Status   Specimen Description BLOOD LEFT [facility]  Final   Special Requests   Final    BOTTLES DRAWN AEROBIC AND ANAEROBIC Blood Culture adequate volume   Culture   Final    NO GROWTH 5 DAYS Performed at [facility], [address] [place], [place] [address]    Report Status 07/28/2020 FINAL  Final   CULTURE, BLOOD (ROUTINE X 2) w Reflex to ID Panel     Status: None   Collection Time: 07/23/20  7:47 PM   Specimen: BLOOD  Result Value Ref Range Status   Specimen Description BLOOD RIGHT [facility]  Final   Special Requests   Final    BOTTLES DRAWN AEROBIC AND ANAEROBIC Blood Culture adequate volume   Culture   Final    NO GROWTH 5 DAYS Performed at [facility], [address], [place], [place] [address]    Report Status 07/28/2020 FINAL  Final  Culture, fungus without smear     Status: None (Preliminary result)   Collection Time: 07/24/20  1:15 PM   Specimen: CSF; Cerebrospinal Fluid  Result Value Ref Range Status   Specimen Description   Final    CSF Performed at [facility], [address], [place], [place] [address]    Special Requests   Final    Normal Performed at [facility], [address], [place], [place] [address]    Culture   Final    NO FUNGUS ISOLATED AFTER 8 DAYS Performed at [facility], [address] [place], [place] [address]    Report Status PENDING  Incomplete  Anaerobic culture     Status: None   Collection Time: 07/24/20  1:15 PM   Specimen: CSF; Cerebrospinal Fluid  Result Value Ref Range Status   Specimen Description   Final    CSF Performed at [facility], [address] [place], [place] [address]    Special Requests   Final    Normal Performed at [facility], [address] [place], [place] [address]    Culture   Final    NO ANAEROBES ISOLATED Performed at  [facility], [address], [place], [place] [address]    Report Status 07/29/2020 FINAL  Final  CSF culture     Status: None   Collection Time: 07/24/20  1:15 PM   Specimen: PATH Cytology CSF; Cerebrospinal Fluid  Result Value Ref Range Status   Specimen Description   Final    CSF Performed at [facility], [address], [place], [place] [address]    Special Requests   Final    NONE Performed at  [facility], [address] [place], [place] [address]    Gram Stain   Final    WBC SEEN NO ORGANISMS SEEN Performed at [facility], [address] [place], [place] [address]    Culture   Final    NO GROWTH Performed at [facility], [address] [place], [place] [address]    Report Status 07/28/2020 FINAL  Final  Urine Culture     Status: None   Collection Time: 07/31/20 11:26 PM   Specimen: Urine, Clean Catch  Result Value Ref Range Status   Specimen Description   Final    URINE, CLEAN CATCH Performed at [facility], [address] [place], [place] [address]    Special Requests   Final    NONE Performed at [facility], [address], [place], [place] [address]    Culture   Final    NO GROWTH Performed at [facility], [address] [place], [place] [address]    Report Status 08/01/2020 FINAL  Final    Coagulation Studies: No results for input(s): LABPROT, INR in the last 72 hours.  Urinalysis: Recent Labs    07/31/20 2326  COLORURINE YELLOW*  LABSPEC 1.017  PHURINE 7.0  GLUCOSEU NEGATIVE  HGBUR NEGATIVE  BILIRUBINUR NEGATIVE  KETONESUR NEGATIVE  PROTEINUR NEGATIVE  NITRITE NEGATIVE  LEUKOCYTESUR NEGATIVE      Imaging: No results found.   Medications:   . sodium chloride 10 mL/hr at 07/30/20 0400  . sodium chloride    . acyclovir 750 mg (08/02/20 0732)  . feeding supplement (VITAL AF 1.2 CAL) 60 mL/hr at 07/30/20 0400   . Chlorhexidine Gluconate Cloth  6 each Topical Daily  . docusate  100 mg Per Tube BID  . enoxaparin (LOVENOX) injection  40 mg Subcutaneous Q24H  . feeding supplement (KATE FARMS STANDARD 1.4)  325 mL Oral BID BM  . Ferrous Fumarate  1 tablet Oral BID  . free water  20 mL Per Tube Q6H  . insulin aspart  0-9 Units Subcutaneous Q4H  . lactulose  20 g Oral BID  . melatonin  5 mg Oral QHS  . metoprolol tartrate  12.5 mg Per Tube BID  . multivitamin  15 mL Per Tube  Daily  . pantoprazole sodium  40 mg Per Tube Daily  . rosuvastatin  20 mg Oral Daily  . sodium chloride flush  3 mL Intravenous Q12H  . tamsulosin  0.4 mg Oral QPC supper  . thiamine  100 mg Oral Daily   sodium chloride, acetaminophen **OR** acetaminophen, haloperidol lactate, liver oil-zinc oxide, ondansetron **OR** ondansetron (ZOFRAN) IV  Assessment/ Plan:  Ms. Casey Baldwin is a 79 y.o. 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  Started normal saline infusion. Mental status has improved significantly. Above plan was discussed and agreed upon on the signing of this note.   Lamont Dowdy, MD [facility]  9/23/20212:19 PM

## 2020-08-02 NOTE — Progress Notes (Addendum)
PROGRESS NOTE    Casey Baldwin  AVW:[id number] DOB: [birth year] DOA: 07/12/2020 PCP: Lesly Rubenstein, MD   Assessment & Plan:   Active Problems:   SIRS (systemic inflammatory response syndrome) (HCC)   Shingles rash   Delirium   Acute metabolic encephalopathy   Essential hypertension   Anemia   Acute lower UTI   Chronic diastolic CHF (congestive heart failure) (HCC)   Sepsis (HCC)   Change in mental status   Anorexia   Malnutrition of moderate degree   Acute metabolic encephalopathy: etiology unclear. S/p LP on 9/14. MRA/MRI head no acute intracranial abnormalities. Abxs were d/c. EEGs were neg for seizures or epileptiform discharges. CSF neg for HSV, West Nile, autoimmune encephalitis, enterovirus. 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 Continue on acyclovir  Day# 21/21. Mental status slightly improved today    Shingles: w/ aseptic meningitis. Continue on acyclovir day# 21/21 as per ID   PSVT: will continue on metoprolol   Poor po intake: continue w/ tube feeds which was initially placed on 07/23/20. Not a permanent solution for poor po intake but unsure if PEG would be either considering pt's extensive abd surg hx including gastric bypass which was initially done in 2008. Speech following and recs apprec   Hyponatremia: etiology unclear, possible SIADH. Labile. Nephro following and recs apprec   Sepsis: secondary to UTI and viral infection. Completed abx course. Resolved  Intra-abdominal fluid collection: w/ CT showing fluid collection likely pseudocyst, seroma or biloma, not abscess. IR did not recommend aspiration   Urinary retention: continue w/ foley. Will do a voiding trial as mental status continues to improve   UTI: completed abx course. 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        Consultants:   ID  nephro    Procedures:    Antimicrobials:    Subjective: Pt c/o having a decreased appetite and fatigue   Objective: Vitals:   08/01/20 0740 08/01/20 1147 08/01/20 2202 08/02/20 0007  BP: (!) 144/84 (!) 141/85 128/66 127/61  Pulse: 99 97 97 88  Resp: 15 15 19 18   Temp: 98.7 F (37.1 C) 98.8 F (37.1 C) 97.7 F (36.5 C) 97.7 F (36.5 C)  TempSrc: Oral Oral Oral Oral  SpO2: 97% 97% 97% 98%  Weight:      Height:        Intake/Output Summary (Last 24 hours) at 08/02/2020 0752 Last data filed at 08/02/2020 0309 Gross per 24 hour  Intake 60 ml  Output 1750 ml  Net -1690 ml   Filed Weights   07/28/20 0412 07/29/20 0444 07/30/20 0500  Weight: 77.2 kg 76.2 kg 78.1 kg    Examination:  General exam: Appears calm but uncomfortable  Respiratory system: decreased breath sounds  b/l. No rales, rhonchi  Cardiovascular system: S1 & S2 +. No  rubs, gallops or clicks.  Gastrointestinal system: Abdomen is nondistended, soft and nontender. Hypoactive bowel sounds  Central nervous system: More awake and alert today. Moves all 4 extremities  Psychiatry: Judgement and insight appear abnormal. Flat mood and and affect.     Data Reviewed: I have personally reviewed following labs and imaging studies  CBC: Recent Labs  Lab 07/30/20 0434 08/01/20 0524 08/02/20 0433  WBC 8.8 7.5 8.1  HGB 9.2* 8.9* 9.4*  HCT 27.6* 25.6* 27.8*  MCV 96.8 94.1 97.2  PLT 278 295 327   Basic Metabolic Panel: Recent Labs  Lab 07/27/20 0412  07/27/20 0412 07/28/20 0902 07/28/20 0902 07/29/20 0313 07/30/20 0434 07/31/20 0408 08/01/20 0524 08/02/20 0433  NA 128*   < > 127*   < > 126* 126* 129* 129* 126*  K 4.0   < > 4.3  --   --  4.6 4.1 4.0 4.5  CL 91*  --  92*  --   --  91*  --  93* 91*  CO2 29  --  26  --   --  25  --  26 26  GLUCOSE 152*  --  178*  --   --  184*  --  165* 183*  BUN 20  --  23  --   --  23  --  24* 26*  CREATININE 0.65  --  0.62  --   --  0.47  --  0.48 0.67  CALCIUM 8.6*  --  8.5*  --   --  8.7*  --  8.9 9.1   < > = values in this interval not displayed.   GFR: Estimated Creatinine Clearance: 56.8 mL/min (by C-G formula based on SCr of 0.67 mg/dL). Liver Function Tests: No results for input(s): AST, ALT, ALKPHOS, BILITOT, PROT, ALBUMIN in the last 168 hours. No results for input(s): LIPASE, AMYLASE in the last 168 hours. No results for input(s): AMMONIA in the last 168 hours. Coagulation Profile: No results for input(s): INR, PROTIME in the last 168 hours. Cardiac Enzymes: No results for input(s): CKTOTAL, CKMB, CKMBINDEX, TROPONINI in the last 168 hours. BNP (last 3 results) No results for input(s): PROBNP in the last 8760 hours. HbA1C: No results for input(s): HGBA1C in the last 72 hours. CBG: Recent Labs  Lab 08/01/20 1149 08/01/20 1659 08/01/20 2025 08/01/20 2344 08/02/20 0448  GLUCAP 149* 165* 168* 171* 191*   Lipid Profile: No results for input(s): CHOL, HDL, LDLCALC, TRIG, CHOLHDL, LDLDIRECT in the last 72 hours. Thyroid Function Tests: No results for input(s): TSH, T4TOTAL, FREET4, T3FREE, THYROIDAB in the last 72 hours. Anemia Panel: No results for input(s): VITAMINB12, FOLATE, FERRITIN, TIBC, IRON, RETICCTPCT in the last 72 hours. Sepsis Labs: No results for input(s): PROCALCITON, LATICACIDVEN in the last 168 hours.  Recent Results (from the past 240 hour(s))  CULTURE, BLOOD (ROUTINE X 2) w Reflex to ID Panel     Status: None   Collection Time: 07/23/20  7:46 PM    Specimen: BLOOD  Result Value Ref Range Status   Specimen Description [facility]  Final   Special Requests   Final    BOTTLES DRAWN AEROBIC AND ANAEROBIC Blood Culture adequate volume   Culture   Final    NO GROWTH 5 DAYS Performed at [facility], [address] [place], [place] [address]    Report Status 07/28/2020 FINAL  Final  CULTURE, BLOOD (  ROUTINE X 2) w Reflex to ID Panel     Status: None   Collection Time: 07/23/20  7:47 PM   Specimen: BLOOD  Result Value Ref Range Status   Specimen Description BLOOD RIGHT [facility]  Final   Special Requests   Final    BOTTLES DRAWN AEROBIC AND ANAEROBIC Blood Culture adequate volume   Culture   Final    NO GROWTH 5 DAYS Performed at [facility], [address] [place], [place] [address]    Report Status 07/28/2020 FINAL  Final  Culture, fungus without smear     Status: None (Preliminary result)   Collection Time: 07/24/20  1:15 PM   Specimen: CSF; Cerebrospinal Fluid  Result Value Ref Range Status   Specimen Description   Final    CSF Performed at [facility], [address] [place], [place] [address]    Special Requests   Final    Normal Performed at [facility], [address], [place], [place] [address]    Culture   Final    NO FUNGUS ISOLATED AFTER 8 DAYS Performed at [facility], [address] [place], [place] [address]    Report Status PENDING  Incomplete  Anaerobic culture     Status: None   Collection Time: 07/24/20  1:15 PM   Specimen: CSF; Cerebrospinal Fluid  Result Value Ref Range Status   Specimen Description   Final    CSF Performed at [facility], [address], [place], [place] [address]    Special Requests   Final    Normal Performed at [facility], [address], [place], [place] [address]    Culture   Final    NO ANAEROBES ISOLATED Performed at [facility], [address] [place], [place] [address]    Report Status  07/29/2020 FINAL  Final  CSF culture     Status: None   Collection Time: 07/24/20  1:15 PM   Specimen: PATH Cytology CSF; Cerebrospinal Fluid  Result Value Ref Range Status   Specimen Description   Final    CSF Performed at [facility], [address], [place], [place] [address]    Special Requests   Final    NONE Performed at [facility], [address] [place], [place] [address]    Gram Stain   Final    WBC SEEN NO ORGANISMS SEEN Performed at [facility], [address], [place], [place] [address]    Culture   Final    NO GROWTH Performed at [facility], [address], [place], [place] [address]    Report Status 07/28/2020 FINAL  Final  Urine Culture     Status: None   Collection Time: 07/31/20 11:26 PM   Specimen: Urine, Clean Catch  Result Value Ref Range Status   Specimen Description   Final    URINE, CLEAN CATCH Performed at [facility], [address], [place], [place] [address]    Special Requests   Final    NONE Performed at [facility], [address] [place], [place] [address]    Culture   Final    NO GROWTH Performed at [facility], [address] [place] [address], [place], [place] [address]    Report Status 08/01/2020 FINAL  Final         Radiology Studies: No results found.      Scheduled Meds: . Chlorhexidine Gluconate Cloth  6  each Topical Daily  . docusate  100 mg Per Tube BID  . enoxaparin (LOVENOX) injection  40 mg Subcutaneous Q24H  . feeding supplement (KATE FARMS STANDARD 1.4)  325 mL Oral BID BM  . Ferrous Fumarate  1 tablet Oral BID  . free water  20 mL Per Tube Q6H  . insulin aspart  0-9 Units Subcutaneous Q4H  . lactulose  20 g Oral BID  . melatonin  5 mg Oral QHS  . metoprolol tartrate  12.5 mg Per Tube BID  . multivitamin  15 mL Per Tube Daily  . pantoprazole sodium  40 mg Per Tube Daily  . rosuvastatin  20 mg Oral Daily  . sodium chloride flush  3 mL Intravenous Q12H    . tamsulosin  0.4 mg Oral QPC supper  . thiamine  100 mg Oral Daily   Continuous Infusions: . sodium chloride 10 mL/hr at 07/30/20 0400  . acyclovir 750 mg (08/02/20 0732)  . feeding supplement (VITAL AF 1.2 CAL) 60 mL/hr at 07/30/20 0400     LOS: 20 days    Time spent: 35 mins    Charise Killian, MD [facility] Pager [phone]  If 7PM-7AM, please contact night-coverage [email or web address] 08/02/2020, 7:52 AM

## 2020-08-02 NOTE — Plan of Care (Signed)
  Problem: Education: Goal: Knowledge of General Education information will improve Description: Including pain rating scale, medication(s)/side effects and non-pharmacologic comfort measures Outcome: Not Progressing   Problem: Health Behavior/Discharge Planning: Goal: Ability to manage health-related needs will improve Outcome: Not Progressing   Problem: Clinical Measurements: Goal: Ability to maintain clinical measurements within normal limits will improve Outcome: Not Progressing Goal: Will remain free from infection Outcome: Progressing Goal: Diagnostic test results will improve Outcome: Progressing Goal: Respiratory complications will improve Outcome: Progressing Goal: Cardiovascular complication will be avoided Outcome: Progressing   Problem: Activity: Goal: Risk for activity intolerance will decrease Outcome: Progressing   Problem: Nutrition: Goal: Adequate nutrition will be maintained Outcome: Progressing   Problem: Coping: Goal: Level of anxiety will decrease Outcome: Progressing   Problem: Elimination: Goal: Will not experience complications related to bowel motility Outcome: Progressing Goal: Will not experience complications related to urinary retention Outcome: Progressing   Problem: Pain Managment: Goal: General experience of comfort will improve Outcome: Progressing   Problem: Safety: Goal: Ability to remain free from injury will improve Outcome: Progressing   Problem: Skin Integrity: Goal: Risk for impaired skin integrity will decrease Outcome: Progressing

## 2020-08-02 NOTE — Care Management Important Message (Signed)
Important Message  Patient Details  Name: Casey Baldwin MRN: [id number] Date of Birth: Apr 09, 1941   Medicare Important Message Given:  Yes     [name] 08/02/2020, 1:04 PM

## 2020-08-02 NOTE — Progress Notes (Signed)
Physical Therapy Treatment Patient Details Name: Casey Baldwin MRN: [id number] DOB: 10-28-1941 Today's Date: 08/02/2020    History of Present Illness Casey Baldwin is a 79 y.o. 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  Presented to ER secondary to generalized body aches, progressive weakness, headaches; admitted for management of SIRS (urine?), shingles and acute metabolic encephalopathy.    PT Comments    Pt awake. Agrees to session.  Participated in exercises as described below.  Encouragement to complete ex's and give full effort.  After rest, agrees to sitting.  Pt demonstrates overall fear of movement despite +2 assist.  Transitions to supine with max a x 2 and softly yells out.  Once sitting, she requires min a x 1 at all times for balance.  She is able to sit about 4 minutes before stating "I am miserable."  Then reports some dizziness.  Assisted to supine with max a x 2/dependant.  Rolling right to check for BM (-) with dependant.  PT encouraged to help "I can but I don't want to."  Fatigued with effort and generally seemed done with attempts at mobility at this time.   Follow Up Recommendations  SNF     Equipment Recommendations       Recommendations for Other Services       Precautions / Restrictions Precautions Precautions: Fall Restrictions Other Position/Activity Restrictions: NG tube; HOB >30 degrees    Mobility  Bed Mobility Overal bed mobility: Needs Assistance Bed Mobility: Supine to Sit;Sit to Supine Rolling: Mod assist;Max assist   Supine to sit: Mod assist;+2 for safety/equipment;HOB elevated Sit to supine: Total assist;+2 for safety/equipment;HOB elevated   General bed mobility comments: encouragement to participate  Transfers                    Ambulation/Gait             General Gait Details: deferred   Stairs             Wheelchair  Mobility    Modified Rankin (Stroke Patients Only)       Balance Overall balance assessment: Needs assistance Sitting-balance support: Bilateral upper extremity supported;Feet supported Sitting balance-Leahy Scale: Poor Sitting balance - Comments: min assist at all times for safety                                    Cognition Arousal/Alertness: Awake/alert Behavior During Therapy: Flat affect Overall Cognitive Status: Impaired/Different from baseline                                        Exercises Other Exercises Other Exercises: supine AAROM - anklep pumps, heel slides, and SLR x 10    General Comments        Pertinent Vitals/Pain Pain Assessment: Faces Faces Pain Scale: Hurts even more Pain Location: back with transitions Pain Descriptors / Indicators: Grimacing;Discomfort;Moaning Pain Intervention(s): Limited activity within patient's tolerance;Monitored during session;Repositioned    Home Living                      Prior Function            PT Goals (current goals can now be found in the care plan section) Progress towards  PT goals: Progressing toward goals    Frequency    Min 2X/week      PT Plan Current plan remains appropriate    Co-evaluation              AM-PAC PT "6 Clicks" Mobility   Outcome Measure  Help needed turning from your back to your side while in a flat bed without using bedrails?: Total Help needed moving from lying on your back to sitting on the side of a flat bed without using bedrails?: Total Help needed moving to and from a bed to a chair (including a wheelchair)?: Total Help needed standing up from a chair using your arms (e.g., wheelchair or bedside chair)?: Total Help needed to walk in hospital room?: Total Help needed climbing 3-5 steps with a railing? : Total 6 Click Score: 6    End of Session Equipment Utilized During Treatment: Oxygen Activity Tolerance: Patient  limited by fatigue;Patient limited by lethargy Patient left: in bed;with call bell/phone within reach;with bed alarm set;Other (comment);with SCD's reapplied;with family/visitor present Nurse Communication: Mobility status;Precautions       Time: 0230-0253 PT Time Calculation (min) (ACUTE ONLY): 23 min  Charges:  $Therapeutic Exercise: 8-22 mins $Therapeutic Activity: 8-22 mins                    Danielle Dess, PTA 08/02/20, 3:18 PM

## 2020-08-03 ENCOUNTER — Inpatient Hospital Stay: Payer: Self-pay

## 2020-08-03 LAB — GLUCOSE, CAPILLARY
Glucose-Capillary: 133 mg/dL — ABNORMAL HIGH (ref 70–99)
Glucose-Capillary: 147 mg/dL — ABNORMAL HIGH (ref 70–99)
Glucose-Capillary: 151 mg/dL — ABNORMAL HIGH (ref 70–99)
Glucose-Capillary: 158 mg/dL — ABNORMAL HIGH (ref 70–99)
Glucose-Capillary: 164 mg/dL — ABNORMAL HIGH (ref 70–99)
Glucose-Capillary: 173 mg/dL — ABNORMAL HIGH (ref 70–99)
Glucose-Capillary: 197 mg/dL — ABNORMAL HIGH (ref 70–99)

## 2020-08-03 LAB — CBC
HCT: 25.8 % — ABNORMAL LOW (ref 36.0–46.0)
Hemoglobin: 9.1 g/dL — ABNORMAL LOW (ref 12.0–15.0)
MCH: 33.5 pg (ref 26.0–34.0)
MCHC: 35.3 g/dL (ref 30.0–36.0)
MCV: 94.9 fL (ref 80.0–100.0)
Platelets: 326 10*3/uL (ref 150–400)
RBC: 2.72 MIL/uL — ABNORMAL LOW (ref 3.87–5.11)
RDW: 13.5 % (ref 11.5–15.5)
WBC: 7.8 10*3/uL (ref 4.0–10.5)
nRBC: 0 % (ref 0.0–0.2)

## 2020-08-03 LAB — MISC LABCORP TEST (SEND OUT)
Labcorp test code: 828731
Labcorp test code: 828733

## 2020-08-03 LAB — BASIC METABOLIC PANEL
Anion gap: 8 (ref 5–15)
BUN: 21 mg/dL (ref 8–23)
CO2: 24 mmol/L (ref 22–32)
Calcium: 8.6 mg/dL — ABNORMAL LOW (ref 8.9–10.3)
Chloride: 96 mmol/L — ABNORMAL LOW (ref 98–111)
Creatinine, Ser: 0.56 mg/dL (ref 0.44–1.00)
GFR calc Af Amer: >60 mL/min (ref >60)
GFR calc non Af Amer: >60 mL/min (ref >60)
Glucose, Bld: 162 mg/dL — ABNORMAL HIGH (ref 70–99)
Potassium: 4.1 mmol/L (ref 3.5–5.1)
Sodium: 128 mmol/L — ABNORMAL LOW (ref 135–145)

## 2020-08-03 MED ORDER — ZINC OXIDE 40 % EX OINT
TOPICAL_OINTMENT | Freq: Two times a day (BID) | CUTANEOUS | Status: DC
  Administered 2020-08-04 – 2020-08-08 (×2): 1 via TOPICAL
  Filled 2020-08-03 (×3): qty 113

## 2020-08-03 MED ORDER — METOPROLOL TARTRATE 5 MG/5ML IV SOLN: 5.0000 mg | Freq: Three times a day (TID) | INTRAVENOUS | Status: DC | PRN

## 2020-08-03 MED ORDER — MODAFINIL 100 MG PO TABS
100.0000 mg | ORAL_TABLET | Freq: Every day | ORAL | Status: DC
  Administered 2020-08-04 – 2020-08-24 (×18): 100 mg via ORAL
  Filled 2020-08-03 (×19): qty 1

## 2020-08-03 NOTE — Progress Notes (Signed)
[facility]  ROUNDING NOTE   Subjective:   Daughter at bedside.  Na 128 (126)  Objective:  Vital signs in last 24 hours:  Temp:  [98.4 F (36.9 C)-98.9 F (37.2 C)] 98.5 F (36.9 C) (09/24 0756) Pulse Rate:  [86-95] 88 (09/24 0756) Resp:  [16-18] 16 (09/24 0756) BP: (140-157)/(65-80) 155/78 (09/24 0756) SpO2:  [96 %-98 %] 98 % (09/24 0756) Weight:  [81.5 kg] 81.5 kg (09/24 0413)  Weight change:  Filed Weights   07/29/20 0444 07/30/20 0500 08/03/20 0413  Weight: 76.2 kg 78.1 kg 81.5 kg    Intake/Output: I/O last 3 completed shifts: In: 1161.2 [P.O.:120; I.V.:329.5; NG/GT:60; IV Piggyback:651.7] Out: 1925 [Urine:1925]   Intake/Output this shift:  No intake/output data recorded.  Physical Exam: General: Awake,alert, pleasant  Head: Normocephalic,NG tube in place with tube feeds  Eyes:  Sclerae and conjunctivae clear  Neck:  Supple  Lungs:  Respiration even, unlabored, Lungs clear bilaterally  Heart: Regular, S1S2+  Abdomen:  Soft, Non distended, non tender  Extremities:  No peripheral edema.  Neurologic: Awake. following commands  Skin: No acute rashes or lesions        Basic Metabolic Panel: Recent Labs  Lab 07/28/20 0902 07/29/20 0313 07/30/20 0434 07/30/20 0434 07/31/20 0408 08/01/20 0524 08/02/20 0433 08/03/20 0338  NA 127*   < > 126*  --  129* 129* 126* 128*  K 4.3  --  4.6  --  4.1 4.0 4.5 4.1  CL 92*  --  91*  --   --  93* 91* 96*  CO2 26  --  25  --   --  26 26 24   GLUCOSE 178*  --  184*  --   --  165* 183* 162*  BUN 23  --  23  --   --  24* 26* 21  CREATININE 0.62  --  0.47  --   --  0.48 0.67 0.56  CALCIUM 8.5*  --  8.7*   < >  --  8.9 9.1 8.6*   < > = values in this interval not displayed.    Liver Function Tests: No results for input(s): AST, ALT, ALKPHOS, BILITOT, PROT, ALBUMIN in the last 168 hours. No results for input(s): LIPASE, AMYLASE in the last 168 hours. No results for input(s): AMMONIA in the last 168  hours.  CBC: Recent Labs  Lab 07/30/20 0434 08/01/20 0524 08/02/20 0433 08/03/20 0338  WBC 8.8 7.5 8.1 7.8  HGB 9.2* 8.9* 9.4* 9.1*  HCT 27.6* 25.6* 27.8* 25.8*  MCV 96.8 94.1 97.2 94.9  PLT 278 295 327 326    Cardiac Enzymes: No results for input(s): CKTOTAL, CKMB, CKMBINDEX, TROPONINI in the last 168 hours.  BNP: Invalid input(s): POCBNP  CBG: Recent Labs  Lab 08/02/20 1539 08/02/20 1946 08/03/20 0004 08/03/20 0415 08/03/20 0755  GLUCAP 173* 169* 164* 147* 133*    Microbiology: Results for orders placed or performed during the hospital encounter of 07/12/20  Urine culture     Status: Abnormal   Collection Time: 07/12/20  9:23 PM   Specimen: Urine, Random  Result Value Ref Range Status   Specimen Description   Final    URINE, RANDOM Performed at [facility], [address] [place], [place] [address]    Special Requests   Final    NONE Performed at [facility], [address] [place], [place] [address]    Culture MULTIPLE SPECIES PRESENT, SUGGEST RECOLLECTION (A)  Final  Report Status 07/15/2020 FINAL  Final  Culture, blood (routine x 2)     Status: None   Collection Time: 07/12/20  9:23 PM   Specimen: BLOOD  Result Value Ref Range Status   Specimen Description BLOOD RIGHT AC  Final   Special Requests   Final    BOTTLES DRAWN AEROBIC AND ANAEROBIC Blood Culture results may not be optimal due to an excessive volume of blood received in culture bottles   Culture   Final    NO GROWTH 5 DAYS Performed at [facility], [address] [place], [place] [address]    Report Status 07/17/2020 FINAL  Final  Culture, blood (routine x 2)     Status: None   Collection Time: 07/12/20  9:23 PM   Specimen: BLOOD  Result Value Ref Range Status   Specimen Description BLOOD RIGHT WRIST  Final   Special Requests   Final    BOTTLES DRAWN AEROBIC AND ANAEROBIC Blood Culture adequate volume   Culture   Final    NO GROWTH 5  DAYS Performed at [facility], [address], [place], [place] [address]    Report Status 07/17/2020 FINAL  Final  SARS Coronavirus 2 by RT PCR (hospital order, performed in [facility] hospital lab) Nasopharyngeal Nasopharyngeal Swab     Status: None   Collection Time: 07/12/20 11:13 PM   Specimen: Nasopharyngeal Swab  Result Value Ref Range Status   SARS Coronavirus 2 NEGATIVE NEGATIVE Final    Comment: (NOTE) SARS-CoV-2 target nucleic acids are NOT DETECTED.  The SARS-CoV-2 RNA is generally detectable in upper and lower respiratory specimens during the acute phase of infection. The lowest concentration of SARS-CoV-2 viral copies this assay can detect is 250 copies / mL. A negative result does not preclude SARS-CoV-2 infection and should not be used as the sole basis for treatment or other patient management decisions.  A negative result may occur with improper specimen collection / handling, submission of specimen other than nasopharyngeal swab, presence of viral mutation(s) within the areas targeted by this assay, and inadequate number of viral copies (<250 copies / mL). A negative result must be combined with clinical observations, patient history, and epidemiological information.  Fact Sheet for Patients:   BoilerBrush.com.cy  Fact Sheet for Healthcare Providers: https://pope.com/  This test is not yet approved or  cleared by the [place] FDA and has been authorized for detection and/or diagnosis of SARS-CoV-2 by FDA under an Emergency Use Authorization (EUA).  This EUA will remain in effect (meaning this test can be used) for the duration of the COVID-19 declaration under Section 564(b)(1) of the Act, 21 U.S.C. section 360bbb-3(b)(1), unless the authorization is terminated or revoked sooner.  Performed at [facility], [address] [place], [place] [address]   MRSA PCR Screening     Status:  None   Collection Time: 07/13/20  2:02 AM   Specimen: Nasal Mucosa; Nasopharyngeal  Result Value Ref Range Status   MRSA by PCR NEGATIVE NEGATIVE Final    Comment:        The GeneXpert MRSA Assay (FDA approved for NASAL specimens only), is one component of a comprehensive MRSA colonization surveillance program. It is not intended to diagnose MRSA infection nor to guide or monitor treatment for MRSA infections. Performed at [facility], [address] [place], [place] [address]   CULTURE, BLOOD (ROUTINE X 2) w Reflex to ID Panel     Status: None   Collection  Time: 07/16/20  5:36 PM   Specimen: BLOOD  Result Value Ref Range Status   Specimen Description BLOOD [facility]  Final   Special Requests   Final    BOTTLES DRAWN AEROBIC ONLY Blood Culture adequate volume   Culture   Final    NO GROWTH 5 DAYS Performed at [facility], [address] [place], [place] [address]    Report Status 07/21/2020 FINAL  Final  CULTURE, BLOOD (ROUTINE X 2) w Reflex to ID Panel     Status: None   Collection Time: 07/16/20  5:36 PM   Specimen: BLOOD  Result Value Ref Range Status   Specimen Description BLOOD [facility]  Final   Special Requests   Final    BOTTLES DRAWN AEROBIC AND ANAEROBIC Blood Culture adequate volume   Culture   Final    NO GROWTH 5 DAYS Performed at [facility], [address], [place], [place] [address]    Report Status 07/21/2020 FINAL  Final  CULTURE, BLOOD (ROUTINE X 2) w Reflex to ID Panel     Status: None   Collection Time: 07/23/20  7:46 PM   Specimen: BLOOD  Result Value Ref Range Status   Specimen Description BLOOD LEFT [facility]  Final   Special Requests   Final    BOTTLES DRAWN AEROBIC AND ANAEROBIC Blood Culture adequate volume   Culture   Final    NO GROWTH 5 DAYS Performed at [facility], [address] [place], [place] [address]    Report Status 07/28/2020 FINAL  Final  CULTURE, BLOOD (ROUTINE X 2) w Reflex to ID Panel      Status: None   Collection Time: 07/23/20  7:47 PM   Specimen: BLOOD  Result Value Ref Range Status   Specimen Description BLOOD RIGHT [facility]  Final   Special Requests   Final    BOTTLES DRAWN AEROBIC AND ANAEROBIC Blood Culture adequate volume   Culture   Final    NO GROWTH 5 DAYS Performed at [facility], [address], [place], [place] [address]    Report Status 07/28/2020 FINAL  Final  Culture, fungus without smear     Status: None (Preliminary result)   Collection Time: 07/24/20  1:15 PM   Specimen: CSF; Cerebrospinal Fluid  Result Value Ref Range Status   Specimen Description   Final    CSF Performed at [facility], [address], [place], [place] [address]    Special Requests   Final    Normal Performed at [facility], [address] [place], [place] [address]    Culture   Final    NO FUNGUS ISOLATED AFTER 9 DAYS Performed at [facility], [address] [place], [place] [address]    Report Status PENDING  Incomplete  Anaerobic culture     Status: None   Collection Time: 07/24/20  1:15 PM   Specimen: CSF; Cerebrospinal Fluid  Result Value Ref Range Status   Specimen Description   Final    CSF Performed at [facility], [address] [place], [place] [address]    Special Requests   Final    Normal Performed at [facility], [address], [place], [place] [address]    Culture   Final    NO ANAEROBES ISOLATED Performed at [facility], [address], [place], [place] [address]    Report Status 07/29/2020 FINAL  Final  CSF culture  Status: None   Collection Time: 07/24/20  1:15 PM   Specimen: PATH Cytology CSF; Cerebrospinal Fluid  Result Value Ref Range Status   Specimen Description   Final    CSF Performed at [facility], [address] [place], [place] [address]    Special Requests   Final    NONE Performed at [facility], [address], [place], [place]  [address]    Gram Stain   Final    WBC SEEN NO ORGANISMS SEEN Performed at [facility], [address] [place], [place] [address]    Culture   Final    NO GROWTH Performed at [facility], [address] [place], [place] [address]    Report Status 07/28/2020 FINAL  Final  Urine Culture     Status: None   Collection Time: 07/31/20 11:26 PM   Specimen: Urine, Clean Catch  Result Value Ref Range Status   Specimen Description   Final    URINE, CLEAN CATCH Performed at [facility], [address], [place], [place] [address]    Special Requests   Final    NONE Performed at [facility], [address], [place], [place] [address]    Culture   Final    NO GROWTH Performed at [facility], [address], [place], [place] [address]    Report Status 08/01/2020 FINAL  Final    Coagulation Studies: No results for input(s): LABPROT, INR in the last 72 hours.  Urinalysis: Recent Labs    07/31/20 2326  COLORURINE YELLOW*  LABSPEC 1.017  PHURINE 7.0  GLUCOSEU NEGATIVE  HGBUR NEGATIVE  BILIRUBINUR NEGATIVE  KETONESUR NEGATIVE  PROTEINUR NEGATIVE  NITRITE NEGATIVE  LEUKOCYTESUR NEGATIVE      Imaging: [place] EKG SITE RITE  Result Date: 08/03/2020 If Site Rite image not attached, placement could not be confirmed due to current cardiac rhythm.    Medications:   . sodium chloride 10 mL/hr at 07/30/20 0400  . sodium chloride Stopped (08/03/20 0630)  . feeding supplement (VITAL AF 1.2 CAL) 60 mL/hr at 07/30/20 0400   . Chlorhexidine Gluconate Cloth  6 each Topical Daily  . docusate  100 mg Per Tube BID  . enoxaparin (LOVENOX) injection  40 mg Subcutaneous Q24H  . feeding supplement (KATE FARMS STANDARD 1.4)  325 mL Oral BID BM  . Ferrous Fumarate  1 tablet Oral BID  . free water  20 mL Per Tube Q6H  . insulin aspart  0-9 Units Subcutaneous Q4H  . lactulose  20 g Oral BID  . liver oil-zinc oxide   Topical BID  . melatonin  5 mg  Oral QHS  . metoprolol tartrate  12.5 mg Per Tube BID  . multivitamin  15 mL Per Tube Daily  . pantoprazole sodium  40 mg Per Tube Daily  . rosuvastatin  20 mg Oral Daily  . sodium chloride flush  3 mL Intravenous Q12H  . tamsulosin  0.4 mg Oral QPC supper  . thiamine  100 mg Oral Daily   sodium chloride, acetaminophen **OR** acetaminophen, haloperidol lactate, liver oil-zinc oxide, ondansetron **OR** ondansetron (ZOFRAN) IV  Assessment/ Plan:  Casey Baldwin is a 79 y.o. 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

## 2020-08-03 NOTE — Progress Notes (Signed)
Physical Therapy Treatment Patient Details Name: Casey Baldwin MRN: [id number] DOB: 03/11/1941 Today's Date: 08/03/2020    History of Present Illness Casey Baldwin is a 79 y.o. 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  Presented to ER secondary to generalized body aches, progressive weakness, headaches; admitted for management of SIRS (urine?), shingles and acute metabolic encephalopathy.    PT Comments    Pt was supine in bed with daughter present throughout session. She is mostly non-verbal but is able to talk. Pt was able to roll L with max assist of one. +2 for all other mobility. Constant vcs and encouragement throughout session for participation. She was able to sit EOB x ~ 12 minutes. Stood 3 x EOB with +2 max assist. Does have knee buckling, mostly LLE. Unsafe to advance away from EOB. Total assist once fatigue to return to supine. Feeding held during session and restarted afterwards. Pt is very limited by cognition and lethargy. At conclusion of session, pt was repositioned in bed, bed alarm in place, feeding running, and daughter at bedside. PT will continue efforts to progress as able per POC. Will need extensive PT at DC to address deficits and return to PLOF.      Follow Up Recommendations  SNF     Equipment Recommendations  Rolling walker with 5" wheels    Recommendations for Other Services       Precautions / Restrictions Precautions Precautions: Fall Restrictions Weight Bearing Restrictions: No Other Position/Activity Restrictions: NG tube; HOB >30 degrees    Mobility  Bed Mobility Overal bed mobility: Needs Assistance Bed Mobility: Rolling Rolling: Max assist   Supine to sit: Mod assist;+2 for safety/equipment;HOB elevated Sit to supine: Total assist;+2 for safety/equipment;HOB elevated   General bed mobility comments: Pt requires +2 assistance for safety to get out of  bed and return to bed for safety  Transfers Overall transfer level: Needs assistance Equipment used: Rolling walker (2 wheeled) Transfers: Sit to/from Stand Sit to Stand: +2 safety/equipment;+2 physical assistance;From elevated surface;Mod assist;Max assist         General transfer comment: pt performed STS 3 x EOB x~ 15 sec. each trial.   Ambulation/Gait             General Gait Details: unsafe/unable       Balance Overall balance assessment: Needs assistance Sitting-balance support: Bilateral upper extremity supported;Feet supported Sitting balance-Leahy Scale: Poor Sitting balance - Comments: MIN-CGA to maintaing balance at EOB this date. more assistance required versus previously observed.           Cognition Arousal/Alertness: Awake/alert Behavior During Therapy: Flat affect Overall Cognitive Status: Impaired/Different from baseline Area of Impairment: Attention;Memory;Following commands;Safety/judgement;Awareness;Problem solving      Orientation Level: Disoriented to;Place;Time;Situation Current Attention Level: Alternating;Divided Memory: Decreased recall of precautions;Decreased short-term memory Following Commands: Follows one step commands inconsistently Safety/Judgement: Decreased awareness of safety;Decreased awareness of deficits Awareness: Intellectual Problem Solving: Slow processing;Decreased initiation;Difficulty sequencing;Requires verbal cues;Requires tactile cues General Comments: Pt has poor cognition and inconsistent ability to follow commands.       Exercises Other Exercises Other Exercises: bed level grooming and toileting tasks with repositioning for  pressure relief; significant assist required        Pertinent Vitals/Pain Pain Assessment:  (moan at times but unable to state she is in pain) Faces Pain Scale: No hurt Pain Location: moans and yells out with all movements and exercises Pain Descriptors / Indicators:  Grimacing;Discomfort;Moaning Pain Intervention(s): Limited activity within patient's tolerance;Monitored during session;Premedicated before session;Repositioned           PT Goals (current goals can now be found in the care plan section) Acute Rehab PT Goals Patient Stated Goal: none stated Progress towards PT goals: Not progressing toward goals - comment (cognition and lethargy )    Frequency    Min 2X/week      PT Plan Current plan remains appropriate       AM-PAC PT "6 Clicks" Mobility   Outcome Measure  Help needed turning from your back to your side while in a flat bed without using bedrails?: A Lot Help needed moving from lying on your back to sitting on the side of a flat bed without using bedrails?: Total Help needed moving to and from a bed to a chair (including a wheelchair)?: Total Help needed standing up from a chair using your arms (e.g., wheelchair or bedside chair)?: Total Help needed to walk in hospital room?: Total Help needed climbing 3-5 steps with a railing? : Total 6 Click Score: 7    End of Session Equipment Utilized During Treatment: Oxygen Activity Tolerance: Patient limited by fatigue;Treatment limited secondary to medical complications (Comment) (limited by cognition/pain) Patient left: in bed;with call bell/phone within reach;with bed alarm set;with nursing/sitter in room Nurse Communication: Mobility status;Precautions PT Visit Diagnosis: Muscle weakness (generalized) (M62.81);Difficulty in walking, not elsewhere classified (R26.2);Other abnormalities of gait and mobility (R26.89)     Time: 2585-2778 PT Time Calculation (min) (ACUTE ONLY): 28 min  Charges:  $Therapeutic Activity: 23-37 mins                     Jetta Lout PTA 08/03/20, 4:21 PM

## 2020-08-03 NOTE — Progress Notes (Addendum)
  Speech Language Pathology Treatment: Dysphagia  Patient Details Name: Casey Baldwin MRN: [id number] DOB: Nov 05, 1941 Today's Date: 08/03/2020 Time: 0071-2197 SLP Time Calculation (min) (ACUTE ONLY): 35 min  Assessment / Plan / Recommendation Clinical Impression  Pt seen today for ongoing assessment of toleration of diet. Pt has been on a mech soft diet per MD w/ NGT in place (NGT in place d/t FTT). Daughter present assisting pt w/ feeding. Pt is less engaged in taking po's today per Dtr.  Pt appears to present w/ significantly reduced physical energy and mental desire for oral intake. She is only taking minimal bites/sips tolerating mostly liquids; Dtr gave few tsp trials w/ no immediate overt s/s of aspiration. However, w/ increased textured foods(soup pieces brought from home), pt only munched on small food boluses then held it orally -- upon not swallowing, the pieces of foods were removed from her mouth.  Pt presents w/ increased risk for aspiration/choking of solids d/t overall weakness and lack of engagement during po intake as well as swallowing around the NGT in place. Discussed swallowing concerns w/ Dtr who agreed to the Pureed consistency diet until pt is able to improve medical to exhibit energy/attention during po tasks for safe swallowing.  Recommend a pureed diet w/ thin liquids w/ aspiration precautions; pills in puree - Crushed as able or via NGT. Feeding support at meals -- must engage and be fully awake. ST services can be reconsulted when pt is appropriate for upgrade of diet -- when NGT can be Removed. NSG updated. Dtr agreed.     HPI HPI: Pt is a 79 y.o. 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  Presented to ER secondary to generalized body aches, progressive weakness, headaches; admitted for management of SIRS (urine?), shingles and acute metabolic encephalopathy.  Pt currently has a  Dobhoff in place w/ continuous TFs running.  Daughter stated pt has h/o Gastric Bypass w/ baseline of small meals but no reports of difficulty swallowing.  NGT remains placed. Pt not taking much po's from family or staff.       SLP Plan  Goals updated       Recommendations  Diet recommendations: Dysphagia 1 (puree);Thin liquid Liquids provided via: Teaspoon;Cup;Straw (monitor) Medication Administration: Crushed with puree (for safer swallowing) Supervision: Staff to assist with self feeding;Full supervision/cueing for compensatory strategies Compensations: Minimize environmental distractions;Slow rate;Small sips/bites;Lingual sweep for clearance of pocketing;Multiple dry swallows after each bite/sip;Follow solids with liquid Postural Changes and/or Swallow Maneuvers: Seated upright 90 degrees;Upright 30-60 min after meal                General recommendations:  (Dietician f/u; Palliative Care f/u) Oral Care Recommendations: Oral care BID;Oral care before and after PO;Staff/trained caregiver to provide oral care Follow up Recommendations: None SLP Visit Diagnosis: Dysphagia, unspecified (R13.10) Plan: Goals updated       GO                 Jerilynn Som, MS, CCC-SLP Speech Language Pathologist Rehab Services [phone] [facility] 08/03/2020, 3:09 PM

## 2020-08-03 NOTE — Progress Notes (Signed)
107: Spoke with RN about request to start IV on pt. Informed her that she has been pulling them out and she acknowledged the fact. Pt. Also pulled her midline out last week. RN said she would call MD to see if pt. Needed to have IVsite.RN did say pt. Has mittens on.

## 2020-08-03 NOTE — Progress Notes (Signed)
OT Cancellation Note  Patient Details Name: Casey Baldwin MRN: [id number] DOB: 14-Dec-1940   Cancelled Treatment:    Reason Eval/Treat Not Completed: Other (comment). Upon attempt, pt with nursing for pt care. RN requesting re-attempt at later time.  Richrd Prime, MPH, MS, OTR/L ascom [phone] 08/03/20, 10:48 AM

## 2020-08-03 NOTE — Progress Notes (Signed)
PROGRESS NOTE    Casey Baldwin  YHC:[id number] DOB: [birth year] DOA: 07/12/2020 PCP: Lesly Rubenstein, MD   Assessment & Plan:   Active Problems:   SIRS (systemic inflammatory response syndrome) (HCC)   Shingles rash   Delirium   Acute metabolic encephalopathy   Essential hypertension   Anemia   Acute lower UTI   Chronic diastolic CHF (congestive heart failure) (HCC)   Sepsis (HCC)   Change in mental status   Anorexia   Malnutrition of moderate degree   Acute metabolic encephalopathy: etiology unclear, meningitis vs delirium. Mental status is labile. S/p LP on 9/14. MRA/MRI head no acute intracranial abnormalities. Abxs were d/c. EEGs were neg for seizures or epileptiform discharges. CSF neg for HSV, West Nile, autoimmune encephalitis, enterovirus. 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 Completed acyclovir 21 day course.  Shingles: w/ aseptic meningitis. Completed a 21 day course of acyclovir   PSVT: will continue on BB   Poor po intake: unchanged. Continue w/ tube feeds which was initially placed on 07/23/20. Not a permanent solution for poor po intake but unsure if PEG would be either, considering pt's extensive abd surg hx including gastric bypass which was initially done in 2008. 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 Trending up today. Will continue to monitor   Sepsis: resolved  Intra-abdominal fluid collection: w/ CT showing fluid collection likely pseudocyst, seroma or biloma, not abscess. IR did not recommend aspiration   Urinary retention: continue w/ foley. Will do a voiding trial when mental status improves  UTI: completed abx course. Resolved  Chronic diastolic CHF: w/o exacerbation. 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      Consultants:   ID  nephro  neuro    Procedures:    Antimicrobials:    Subjective: Pt is lethargic again today and not answering questions  Objective: Vitals:   08/02/20 1944 08/03/20 0001 08/03/20 0413 08/03/20 0418  BP: 140/72 (!) 142/65  (!) 157/80  Pulse: 87 92  95  Resp: 16 16  18   Temp: 98.7 F (37.1 C) 98.4 F (36.9 C)  98.8 F (37.1 C)  TempSrc: Oral Oral  Oral  SpO2: 98% 96%  97%  Weight:   81.5 kg   Height:        Intake/Output Summary (Last 24 hours) at 08/03/2020 0749 Last data filed at 08/03/2020 0421 Gross per 24 hour  Intake 1101.21 ml  Output 1425 ml  Net -323.79 ml   Filed Weights   07/29/20 0444 07/30/20 0500 08/03/20 0413  Weight: 76.2 kg 78.1 kg 81.5 kg    Examination:  General exam: Appears lethargic Respiratory system: diminished breath sounds b/l. No rales, wheezes Cardiovascular system: S1/S2+. No rubs, gallops.  Gastrointestinal system: Abdomen is nondistended, soft and nontender. Normal bowel sounds  Central nervous system: Lethargic. Moves all 4 extremities  Psychiatry: Judgement and insight appear abnormal. Flat mood and affect     Data Reviewed: I have personally reviewed following labs and imaging studies  CBC: Recent Labs  Lab 07/30/20 0434 08/01/20 0524 08/02/20 0433 08/03/20 0338  WBC 8.8 7.5 8.1 7.8  HGB 9.2* 8.9* 9.4* 9.1*  HCT 27.6* 25.6* 27.8* 25.8*  MCV 96.8 94.1 97.2 94.9  PLT 278 295 327 326   Basic Metabolic Panel: Recent Labs  Lab 07/28/20 0902 07/29/20 0313 07/30/20 0434 07/31/20 0408  08/01/20 0524 08/02/20 0433 08/03/20 0338  NA 127*   < > 126* 129* 129* 126* 128*  K 4.3  --  4.6 4.1 4.0 4.5 4.1  CL 92*  --  91*  --  93* 91* 96*  CO2 26  --  25  --  26 26 24   GLUCOSE 178*  --  184*  --  165* 183* 162*  BUN 23  --  23  --  24* 26* 21  CREATININE 0.62  --  0.47  --  0.48 0.67 0.56  CALCIUM 8.5*  --  8.7*  --  8.9 9.1 8.6*   < > = values in this interval not displayed.   GFR: Estimated Creatinine Clearance: 57.9 mL/min (by C-G formula based on SCr of 0.56 mg/dL). Liver Function Tests: No results for input(s): AST, ALT, ALKPHOS, BILITOT, PROT, ALBUMIN in the last 168 hours. No results for input(s): LIPASE, AMYLASE in the last 168 hours. No results for input(s): AMMONIA in the last 168 hours. Coagulation Profile: No results for input(s): INR, PROTIME in the last 168 hours. Cardiac Enzymes: No results for input(s): CKTOTAL, CKMB, CKMBINDEX, TROPONINI in the last 168 hours. BNP (last 3 results) No results for input(s): PROBNP in the last 8760 hours. HbA1C: No results for input(s): HGBA1C in the last 72 hours. CBG: Recent Labs  Lab 08/02/20 1151 08/02/20 1539 08/02/20 1946 08/03/20 0004 08/03/20 0415  GLUCAP 144* 173* 169* 164* 147*   Lipid Profile: No results for input(s): CHOL, HDL, LDLCALC, TRIG, CHOLHDL, LDLDIRECT in the last 72 hours. Thyroid Function Tests: No results for input(s): TSH, T4TOTAL, FREET4, T3FREE, THYROIDAB in the last 72 hours. Anemia Panel: No results for input(s): VITAMINB12, FOLATE, FERRITIN, TIBC, IRON, RETICCTPCT in the last 72 hours. Sepsis Labs: No results for input(s): PROCALCITON, LATICACIDVEN in the last 168 hours.  Recent Results (from the past 240 hour(s))  Culture, fungus without smear     Status: None (Preliminary result)   Collection Time: 07/24/20  1:15 PM   Specimen: CSF; Cerebrospinal Fluid  Result Value Ref Range Status   Specimen Description   Final    CSF Performed at [facility], [address], [place], [place]    Special Requests   Final    Normal Performed at [facility], [address], [place], [place]    Culture   Final    NO FUNGUS ISOLATED AFTER 9 DAYS Performed at [facility], [address] [place], [place]    Report Status PENDING  Incomplete  Anaerobic culture     Status: None   Collection Time: 07/24/20  1:15 PM   Specimen: CSF; Cerebrospinal Fluid  Result Value Ref Range Status   Specimen Description   Final    CSF Performed at [facility], [address], [place], [place]  [address]    Special Requests   Final    Normal Performed at [facility], [address] [place], [place] [address]    Culture   Final    NO ANAEROBES ISOLATED Performed at [facility], [address], [place], [place] [address]    Report Status 07/29/2020 FINAL  Final  CSF culture     Status: None   Collection Time: 07/24/20  1:15 PM   Specimen: PATH Cytology CSF; Cerebrospinal Fluid  Result Value Ref Range Status   Specimen Description   Final    CSF Performed at [facility], [address], [place], [place] [address]    Special Requests   Final    NONE Performed at [facility], [address] [place], [place] [address]    Gram Stain   Final    WBC SEEN NO ORGANISMS SEEN Performed at [facility], [address] [place], [place] [address]    Culture   Final    NO GROWTH Performed at [facility], [address] [place], [place] [address]    Report Status 07/28/2020 FINAL  Final  Urine Culture     Status: None   Collection Time: 07/31/20 11:26 PM   Specimen: Urine, Clean Catch  Result Value Ref Range Status   Specimen Description   Final    URINE, CLEAN CATCH Performed at [facility], [address], [place], [place] [address]    Special Requests   Final    NONE Performed at [facility], [address], [place], [place]  [address]    Culture   Final    NO GROWTH Performed at [facility], [address], [place], [place] [address]    Report Status 08/01/2020 FINAL  Final         Radiology Studies: No results found.      Scheduled Meds: . Chlorhexidine Gluconate Cloth  6 each Topical Daily  . docusate  100 mg Per Tube BID  . enoxaparin (LOVENOX) injection  40 mg Subcutaneous Q24H  . feeding supplement (KATE FARMS STANDARD 1.4)  325 mL Oral BID BM  . Ferrous Fumarate  1 tablet Oral BID  . free water  20 mL Per Tube Q6H  . insulin aspart  0-9 Units Subcutaneous Q4H  . lactulose  20 g Oral BID  . melatonin  5 mg Oral QHS  . metoprolol tartrate  12.5 mg Per Tube BID  . multivitamin  15 mL Per Tube Daily  . pantoprazole sodium  40 mg Per Tube Daily  . rosuvastatin  20 mg Oral Daily  . sodium chloride flush  3 mL Intravenous Q12H  . tamsulosin  0.4 mg Oral QPC supper  . thiamine  100 mg Oral Daily   Continuous Infusions: . sodium chloride 10 mL/hr at 07/30/20 0400  . sodium chloride Stopped (08/03/20 0630)  . feeding supplement (VITAL AF 1.2 CAL) 60 mL/hr at 07/30/20 0400     LOS: 21 days    Time spent: 36 mins    Charise Killian, MD [facility] Pager [phone]  If 7PM-7AM, please contact night-coverage [email or web address] 08/03/2020, 7:49 AM

## 2020-08-03 NOTE — Progress Notes (Signed)
S  Subjective: Periods of confusion and still hypoactive delirium.  S/p discussion with with daughter at bedside.    Past Surgical History:  Procedure Laterality Date  . ABDOMINAL HYSTERECTOMY    . COLON RESECTION    . FACIAL COSMETIC SURGERY    . FRACTURE SURGERY     left leg and left ankle.  Marland Kitchen GASTRIC BYPASS    . left knee replacement     Family History  Problem Relation Age of Onset  . Colon cancer Mother    Social History   Socioeconomic History  . Marital status: Widowed    Spouse name: Not on file  . Number of children: Not on file  . Years of education: Not on file  . Highest education level: Not on file  Occupational History  . Not on file  Tobacco Use  . Smoking status: Never Smoker  . Smokeless tobacco: Never Used  Vaping Use  . Vaping Use: Never used  Substance and Sexual Activity  . Alcohol use: No  . Drug use: No  . Sexual activity: Not on file  Other Topics Concern  . Not on file  Social History Narrative  . Not on file   Social Determinants of Health   Financial Resource Strain:   . Difficulty of Paying Living Expenses: Not on file  Food Insecurity:   . Worried About Programme researcher, broadcasting/film/video in the Last Year: Not on file  . Ran Out of Food in the Last Year: Not on file  Transportation Needs:   . Lack of Transportation (Medical): Not on file  . Lack of Transportation (Non-Medical): Not on file  Physical Activity:   . Days of Exercise per Week: Not on file  . Minutes of Exercise per Session: Not on file  Stress:   . Feeling of Stress : Not on file  Social Connections:   . Frequency of Communication with Friends and Family: Not on file  . Frequency of Social Gatherings with Friends and Family: Not on file  . Attends Religious Services: Not on file  . Active Member of Clubs or Organizations: Not on file  . Attends Banker Meetings: Not on file  . Marital Status: Not on file   Allergies  Allergen Reactions  . Lidocaine Anaphylaxis  and Other (See Comments)    Became unconscious with administration for dental procedure at age 49yo. Pt tolerated subsequent lidocaine.    . Amlodipine Other (See Comments)    Hand and leg cramping  . Lactose Diarrhea  . Lactose Intolerance (Gi)   . Latex     Medications   Medications Prior to Admission  Medication Sig Dispense Refill Last Dose  . Acetaminophen 500 MG capsule Take 500 mg by mouth daily as needed.      . Artificial Tear Ointment (DRY EYES OP) Place 1-2 drops into both eyes daily as needed.     Marland Kitchen aspirin 81 MG EC tablet Take 81 mg by mouth daily.      . bisacodyl (DULCOLAX) 5 MG EC tablet Take 10 mg by mouth at bedtime.      . calcium carbonate (OS-CAL) 1250 (500 Ca) MG chewable tablet Chew 2 tablets by mouth daily.     . Cholecalciferol (VITAMIN D PO) Take 1 tablet by mouth daily.     . furosemide (LASIX) 20 MG tablet Take 20-40 mg by mouth daily. CAN TAKE 40MG  FOR SWELLING     . isosorbide mononitrate (IMDUR) 30 MG 24 hr  tablet Take 30 mg by mouth daily.     . Multiple Vitamin (MULTIVITAMIN WITH MINERALS) TABS tablet Take 1 tablet by mouth daily.     . Multiple Vitamins-Minerals (ICAPS AREDS 2 PO) Take 1 capsule by mouth daily.     . ondansetron (ZOFRAN) 4 MG tablet Take 4 mg by mouth daily as needed.     . pantoprazole (PROTONIX) 20 MG tablet Take 20 mg by mouth daily.     . rosuvastatin (CRESTOR) 20 MG tablet Take 20 mg by mouth daily.     Marland Kitchen senna-docusate (SENOKOT-S) 8.6-50 MG tablet Take 2 tablets by mouth 2 (two) times daily. (Patient not taking: Reported on 04/01/2020) 120 tablet 0   . witch hazel-glycerin (TUCKS) pad Apply 1 application topically as needed for itching. (Patient not taking: Reported on 07/10/2020) 40 each 12      Vitals  Temp:  [98.4 F (36.9 C)-98.8 F (37.1 C)] 98.5 F (36.9 C) (09/24 0756) Pulse Rate:  [87-95] 88 (09/24 0756) Resp:  [16-18] 16 (09/24 0756) BP: (140-157)/(65-80) 155/78 (09/24 0756) SpO2:  [96 %-98 %] 98 % (09/24  0756) Weight:  [81.5 kg] 81.5 kg (09/24 0413)  Body mass index is 30.84 kg/m.  Physical Exam    Neurologic Examination  Mental status/Cognition: Opens eyes spontaneously, tracks my face across the room, smiles and is oriented to self and place. Follows commands in both arms and legs.   Speech/language: No dysarthria noted. Cranial nerves:   CN II Pupils equal and reactive to light, blinks to threat and tracks face.   CN III,IV,VI No gaze preference, EOMI intact to dolls eyes   CN V    CN VII Symmetric facial smile   CN VIII    CN IX & X    CN XI    CN XII    Motor:  Muscle bulk: normal, tone normal. Follows commands in all extremities. Reflexes:  Right Left Comments  Pectoralis      Biceps (C5/6) 2 2   Brachioradialis (C5/6) 2 2    Triceps (C6/7) 2 2    Patellar (L3/4) 2 2    Achilles (S1) 1 1    Hoffman      Plantar     Jaw jerk     Sensation:  Light touch Regards touch in all extremities.   Pin prick    Temperature    Vibration   Proprioception    Coordination/Complex Motor:  No tremor with movements.  Labs   Lab Results  Component Value Date   NA 128 (L) 08/03/2020   K 4.1 08/03/2020   CL 96 (L) 08/03/2020   CO2 24 08/03/2020   GLUCOSE 162 (H) 08/03/2020   BUN 21 08/03/2020   CREATININE 0.56 08/03/2020   CALCIUM 8.6 (L) 08/03/2020   ALBUMIN 3.4 (L) 07/24/2020   AST 27 07/23/2020   ALT 29 07/23/2020   ALKPHOS 71 07/23/2020   BILITOT 0.8 07/23/2020   GFRNONAA >60 08/03/2020   GFRAA >60 08/03/2020     Imaging and Diagnostic studies  MRI Brain with and without contrast on 07/24/20: 1. Stable MRI appearance of the brain since 07/13/2020, with no acute or inflammatory process identified. 2. Mild for age nonspecific white matter signal changes, most commonly due to chronic small vessel disease. 3. Minor paranasal sinus mucosal thickening with right nasoenteric tube in place.  MRA head without contrast on  07/24/20: 1. Intracranial MRA degraded by motion, such that the circle-of-Willis branch detail is degraded in  the anterior circulation. There is no evidence of a large vessel vasculitis. 2. No large vessel occlusion or significant arterial stenosis identified. There is generalized arterial tortuosity.  rEEG on 9/16: This study is suggestive of moderate diffuse encephalopathy, nonspecific etiology. No seizures or epileptiform discharges were seen throughout the recording.   - Hypoactive delirium  - d/w daughter - likely related to metabolic encephalopathy - will try provigil 100mg  daily - Continue stimulation during the day and promote sleep at night ? Melatonin if needed. Would avoid sedatives for now

## 2020-08-03 NOTE — Progress Notes (Signed)
Occupational Therapy Treatment Patient Details Name: Casey Baldwin MRN: [id number] DOB: 1941-04-07 Today's Date: 08/03/2020    History of present illness Casey Baldwin is a 79 y.o. 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  Presented to ER secondary to generalized body aches, progressive weakness, headaches; admitted for management of SIRS (urine?), shingles and acute metabolic encephalopathy.   OT comments  Pt seen for OT tx session this date. RN cleared for therapy, turned of NGT nutrition at start of session (notified of end of session to turn back on and HOB left elevated >30degrees). Pt lethargic, requires multimodal cues to improve alertness. Max A hand over hand assist for bed level grooming tasks to wash face. Pt noted with BM. Nurse tech in to assist. +2 for bed level toileting for pericare and rolling. Unable to maintain sidelying requiring continued physical assist. Pt moaning with pericare but when asked several times pt denies pain. Repositioning for improved comfort and pressure relief. Dtr present for portion of session and notes that pt had a much better day yesterday. Will continue to progress towards goals. Continue to recommend SNF at discharge.   Follow Up Recommendations  SNF    Equipment Recommendations  Other (comment) (TBD)    Recommendations for Other Services      Precautions / Restrictions Precautions Precautions: Fall Restrictions Weight Bearing Restrictions: No Other Position/Activity Restrictions: NG tube; HOB >30 degrees       Mobility Bed Mobility Overal bed mobility: Needs Assistance Bed Mobility: Rolling Rolling: Max assist         General bed mobility comments: Max A, Max multimodal cues for BUE placement on bed rail, unable to maintain sidelying  Transfers                      Balance                                            ADL either performed or assessed with clinical judgement   ADL Overall ADL's : Needs assistance/impaired     Grooming: Maximal assistance;Bed level;Wash/dry face Grooming Details (indicate cue type and reason): hand over hand assist                     Toileting- Clothing Manipulation and Hygiene: Bed level;+2 for physical assistance;Maximal assistance Toileting - Clothing Manipulation Details (indicate cue type and reason): Max A for rolling to side and maintaining sidelying; max A for pericare from 2nd person             Vision       Perception     Praxis      Cognition Arousal/Alertness: Awake/alert Behavior During Therapy: Flat affect Overall Cognitive Status: Impaired/Different from baseline                                 General Comments: flat affect, poor cognition continues. Requires verbal/visual/tactile cues to participate in bed level toileting task; nods to simple yes/no question, difficulty with open ended; hearing aides in but question if working properly        Exercises Other Exercises Other Exercises: bed level grooming and toileting tasks with repositioning for  pressure relief; significant assist required   Shoulder Instructions  General Comments      Pertinent Vitals/ Pain       Pain Assessment: Faces Faces Pain Scale: Hurts even more Pain Location: pt moaning/crying out with pericare Pain Descriptors / Indicators: Grimacing;Discomfort;Moaning Pain Intervention(s): Limited activity within patient's tolerance;Monitored during session;Repositioned  Home Living                                          Prior Functioning/Environment              Frequency  Min 1X/week        Progress Toward Goals  OT Goals(current goals can now be found in the care plan section)  Progress towards OT goals: OT to reassess next treatment  Acute Rehab OT Goals Patient Stated Goal: none stated OT  Goal Formulation: With family Time For Goal Achievement: 08/10/20 Potential to Achieve Goals: Fair  Plan Frequency remains appropriate;Discharge plan remains appropriate    Co-evaluation                 AM-PAC OT "6 Clicks" Daily Activity     Outcome Measure   Help from another person eating meals?: A Lot Help from another person taking care of personal grooming?: A Lot Help from another person toileting, which includes using toliet, bedpan, or urinal?: Total Help from another person bathing (including washing, rinsing, drying)?: A Lot Help from another person to put on and taking off regular upper body clothing?: A Lot Help from another person to put on and taking off regular lower body clothing?: A Lot 6 Click Score: 11    End of Session    OT Visit Diagnosis: Other abnormalities of gait and mobility (R26.89)   Activity Tolerance Patient limited by pain;Other (comment) (cognition)   Patient Left in bed;with call bell/phone within reach;with bed alarm set;with SCD's reapplied   Nurse Communication Other (comment) (session completed, ready for NGT to be turned back on for nutrition)        Time: 7106-2694 OT Time Calculation (min): 25 min  Charges: OT General Charges $OT Visit: 1 Visit OT Treatments $Self Care/Home Management : 23-37 mins  Richrd Prime, MPH, MS, OTR/L ascom [phone] 08/03/20, 2:47 PM

## 2020-08-03 NOTE — Progress Notes (Signed)
Date of Admission:  07/12/2020   Received 21 days of acyclovir completed 08/03/20  ID: Casey Baldwin is a 79 y.o. female  Active Problems:   SIRS (systemic inflammatory response syndrome) (HCC)   Shingles rash   Delirium   Acute metabolic encephalopathy   Essential hypertension   Anemia   Acute lower UTI   Chronic diastolic CHF (congestive heart failure) (HCC)   Sepsis (HCC)   Change in mental status   Anorexia   Malnutrition of moderate degree    Subjective: She does not volunteer any complaints Waking and waning mental alertness Today she is somnolent , just opens eyes but does not talk like she did last evening   Medications:   Chlorhexidine Gluconate Cloth  6 each Topical Daily   docusate  100 mg Per Tube BID   enoxaparin (LOVENOX) injection  40 mg Subcutaneous Q24H   feeding supplement (KATE FARMS STANDARD 1.4)  325 mL Oral BID BM   Ferrous Fumarate  1 tablet Oral BID   free water  20 mL Per Tube Q6H   insulin aspart  0-9 Units Subcutaneous Q4H   lactulose  20 g Oral BID   melatonin  5 mg Oral QHS   metoprolol tartrate  12.5 mg Per Tube BID   multivitamin  15 mL Per Tube Daily   pantoprazole sodium  40 mg Per Tube Daily   rosuvastatin  20 mg Oral Daily   sodium chloride flush  3 mL Intravenous Q12H   tamsulosin  0.4 mg Oral QPC supper   thiamine  100 mg Oral Daily    Objective: Vital signs in last 24 hours: Temp:  [98.4 F (36.9 C)-98.9 F (37.2 C)] 98.5 F (36.9 C) (09/24 0756) Pulse Rate:  [86-95] 88 (09/24 0756) Resp:  [16-18] 16 (09/24 0756) BP: (140-157)/(65-80) 155/78 (09/24 0756) SpO2:  [96 %-98 %] 98 % (09/24 0756) Weight:  [81.5 kg] 81.5 kg (09/24 0413)  PHYSICAL EXAM:  General: somnolent Lungs:b/l air entry Heart: s1s2 Abdomen: Soft, non-tender,not distended. Foley Ng tube feeds Extremities: atraumatic, no cyanosis. No edema. No clubbing Skin: No rashes or lesions. Or bruising Lymph: Cervical, supraclavicular  normal. Neurologic:cannot be assessed Lab Results Recent Labs    08/02/20 0433 08/03/20 0338  WBC 8.1 7.8  HGB 9.4* 9.1*  HCT 27.8* 25.8*  NA 126* 128*  K 4.5 4.1  CL 91* 96*  CO2 26 24  BUN 26* 21  CREATININE 0.67 0.56   Liver Panel No results for input(s): PROT, ALBUMIN, AST, ALT, ALKPHOS, BILITOT, BILIDIR, IBILI in the last 72 hours. Sedimentation Rate No results for input(s): ESRSEDRATE in the last 72 hours. C-Reactive Protein No results for input(s): CRP in the last 72 hours.  Microbiology:  Studies/Results: No results found.   Assessment/Plan: Admitted on 9/12 with chest pain rt side, fever, headache and leg pain and apparently intermittent confusion as per daughter Found to have herpes zoster Thoracic dermatome  Herpes zoster with aseptic meningitis on admission ( patient had refused LP)- was started on IV acyclovir 10mg /Kg q 8. She was alert and verbal and appropriate on presentation but after 5-6 days gradual deterioration to obtundation. Seen by neuro- delirium was questioned -EEG on 07/20/20 no seizure activity..There were many reasons including changes in sleep rhythm, poor intake, constipation, not wearing CPAp ( but no co2 narcosis) hyponatremia for the obtundation On 9/14 Had LP which revealed lymphocytic pleocytosis So far bacterial culture neg and HSV DNA neg- VZVneg .west Nile virus IgG in  csf was positive but not IgM and serum antibodies neg- so this is not acute WNV and IgG is not a reliable test for diagnosis of acute infection as there is cross reactivity with other flavi virus and dengue-   MRI and MRA done on 9/14 no acute changes. Daughter wanted her to be in [place], but no bed and hence Repeat EEG done on 07/25/20 no seizure activity noted. 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 VZV PCR neg in csf but VzV IgG > 2000 Completed 21 days of acycolvir threapy for VZV induced meningo encephalitis waking and waning mental alertness- neurology at bed side- hypodelirium She has hyponatremia - now followed by nephrologist  Urinary retention  -9/12- has foley-   Rt sided abdominal firmness and fullness CT abdomen showsFluid collection at lies along the posterior margin of the pancreas, predominantly collecting along the right anterior pararenal fascia. This may reflect an abscess. It could be a pseudocyst if there is a history of pancreatitis. It may be a postoperative collection, since the patient underwent a cholecystectomy since the prior CT. Collection measures approximately 14 x 4 x 10 cm in size. repeat CT show the samecollection which is thought to be either a pseudocyst, seroma or biloma and not abscess IR does not recommend aspiration.  Complicated stay at [facility] between 04/01/20 until 05/14/20 for abdominal pain which was diagnosed as choledocholithiasis/cholangitis in a setting of prior RYGB in 2008. 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 and she was taken to [facility]20 for diagnostic lap, robotic lysis of adhesions, transgastric ERCP and biliary sphincterotomy, cholecystectomy, partial gastrectomy andaccidentalcolotomy which was repaired.The immediate post op was complicated by post ERCP pancreatitis , pulmonary edema with acute hypoxic resp failure , Afib ( treated with amio and metoprolol), fever and leucocytosis suspected due to pancreatitis and was treated with zosyn, UTI due to proteus and acute on chronic anemia needing PRBC and AKI.She was seen by gerontologist for insomnia and risk for delirium    H/o left TKA  Hearing loss  Discussed the management with her daughter and neurologist. ID will follow her peripherally this weekend- call if needed

## 2020-08-03 NOTE — Progress Notes (Signed)
PICC order canceled per Dr Mayford Knife as IV was able to insert PIV.

## 2020-08-04 LAB — BASIC METABOLIC PANEL
Anion gap: 10 (ref 5–15)
BUN: 21 mg/dL (ref 8–23)
CO2: 24 mmol/L (ref 22–32)
Calcium: 8.9 mg/dL (ref 8.9–10.3)
Chloride: 93 mmol/L — ABNORMAL LOW (ref 98–111)
Creatinine, Ser: 0.6 mg/dL (ref 0.44–1.00)
GFR calc Af Amer: >60 mL/min (ref >60)
GFR calc non Af Amer: >60 mL/min (ref >60)
Glucose, Bld: 148 mg/dL — ABNORMAL HIGH (ref 70–99)
Potassium: 4.2 mmol/L (ref 3.5–5.1)
Sodium: 127 mmol/L — ABNORMAL LOW (ref 135–145)

## 2020-08-04 LAB — CBC
HCT: 27.1 % — ABNORMAL LOW (ref 36.0–46.0)
Hemoglobin: 9.6 g/dL — ABNORMAL LOW (ref 12.0–15.0)
MCH: 33.4 pg (ref 26.0–34.0)
MCHC: 35.4 g/dL (ref 30.0–36.0)
MCV: 94.4 fL (ref 80.0–100.0)
Platelets: 381 10*3/uL (ref 150–400)
RBC: 2.87 MIL/uL — ABNORMAL LOW (ref 3.87–5.11)
RDW: 13.6 % (ref 11.5–15.5)
WBC: 9.8 10*3/uL (ref 4.0–10.5)
nRBC: 0 % (ref 0.0–0.2)

## 2020-08-04 LAB — OSMOLALITY, URINE: Osmolality, Ur: 480 mOsm/kg (ref 300–900)

## 2020-08-04 LAB — SODIUM, URINE, RANDOM: Sodium, Ur: 114 mmol/L

## 2020-08-04 LAB — OSMOLALITY: Osmolality: 266 mOsm/kg — ABNORMAL LOW (ref 275–295)

## 2020-08-04 LAB — GLUCOSE, CAPILLARY
Glucose-Capillary: 157 mg/dL — ABNORMAL HIGH (ref 70–99)
Glucose-Capillary: 152 mg/dL — ABNORMAL HIGH (ref 70–99)
Glucose-Capillary: 138 mg/dL — ABNORMAL HIGH (ref 70–99)
Glucose-Capillary: 138 mg/dL — ABNORMAL HIGH (ref 70–99)
Glucose-Capillary: 177 mg/dL — ABNORMAL HIGH (ref 70–99)

## 2020-08-04 LAB — URIC ACID: Uric Acid, Serum: 1.9 mg/dL — ABNORMAL LOW (ref 2.5–7.1)

## 2020-08-04 NOTE — Progress Notes (Signed)
[facility] called to follow up with patient transfer. Updated with vital signs and patient status. Still waiting for bed.

## 2020-08-04 NOTE — Progress Notes (Signed)
Updated patient's family member, Darl Pikes, regarding how patient's night was. Family member reports that patient's sister will be here this morning. Patient's sister expressed appreciation and gratitude for [place] caring for pt.

## 2020-08-04 NOTE — Progress Notes (Signed)
PROGRESS NOTE    Casey Baldwin  XIP:[id number] DOB: 1941-05-26 DOA: 07/12/2020 PCP: Lesly Rubenstein, MD   Assessment & Plan:   Active Problems:   SIRS (systemic inflammatory response syndrome) (HCC)   Shingles rash   Delirium   Acute metabolic encephalopathy   Essential hypertension   Anemia   Acute lower UTI   Chronic diastolic CHF (congestive heart failure) (HCC)   Sepsis (HCC)   Change in mental status   Anorexia   Malnutrition of moderate degree   Acute metabolic encephalopathy: etiology unclear, meningitis vs delirium. Mental status is labile, slightly improved from day prior. S/p LP on 9/14. MRA/MRI head no acute intracranial abnormalities. Abxs were d/c. EEGs were neg for seizures or epileptiform discharges. CSF neg for HSV, West Nile, autoimmune encephalitis, enterovirus. 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 Completed acyclovir 21 day course. Pt's daughter refused provigil which was initially started by neuro   Shingles: w/ aseptic meningitis. Completed a 21 day course of acyclovir   PSVT: will continue on BB   Poor po intake: unchanged. Continue w/ tube feeds which was initially placed on 07/23/20. Not a permanent solution for poor po intake but unsure if PEG tube would be either, considering pt's extensive abd surg hx including gastric bypass which was initially done in 2008. If unchanged on 08/06/20 then will discuss permanent feeding options w/ IR. 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 Na level is labile   Sepsis: resolved  Intra-abdominal fluid collection: w/ CT showing fluid collection likely pseudocyst, seroma or biloma, not abscess. IR did not recommend aspiration   Urinary retention: continue w/ foley. Will do a voiding trial when mental status improves  UTI: completed abx course. Resolved  Chronic diastolic CHF: w/o exacerbation. 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      Consultants:   ID  nephro  neuro    Procedures:    Antimicrobials:    Subjective: Pt is more awake today but not answering questions   Objective: Vitals:   08/03/20 1959 08/03/20 2328 08/04/20 0502 08/04/20 0610  BP: (!) 158/102 (!) 146/102 (!) 152/80   Pulse: (!) 105 100 95   Resp: 17 17 16    Temp: 98.4 F (36.9 C) 98.2 F (36.8 C) 97.9 F (36.6 C)   TempSrc: Oral Oral Oral   SpO2: 98% 98% 95%   Weight:    76.4 kg  Height:        Intake/Output Summary (Last 24 hours) at 08/04/2020 0732 Last data filed at 08/04/2020 0600 Gross per 24 hour  Intake 940 ml  Output 3000 ml  Net -2060 ml   Filed Weights   07/30/20 0500 08/03/20 0413 08/04/20 0610  Weight: 78.1  kg 81.5 kg 76.4 kg    Examination:  General exam: Appears calm & comfortable  Respiratory system: decreased breath sounds b/l. No rales, rhonchi  Cardiovascular system:  S1/S2+. No rubs or gallops  Gastrointestinal system: Abdomen is nondistended, soft and nontender. Hyperactive bowel sounds Central nervous system: Awake. Unable to follow simple commands. Moves all 4 extremities  Psychiatry: Judgement and insight appear abnormal. Flat mood and affect     Data Reviewed: I have personally reviewed following labs and imaging studies  CBC: Recent Labs  Lab 07/30/20 0434 08/01/20 0524 08/02/20 0433 08/03/20 0338 08/04/20 0446   WBC 8.8 7.5 8.1 7.8 9.8  HGB 9.2* 8.9* 9.4* 9.1* 9.6*  HCT 27.6* 25.6* 27.8* 25.8* 27.1*  MCV 96.8 94.1 97.2 94.9 94.4  PLT 278 295 327 326 381   Basic Metabolic Panel: Recent Labs  Lab 07/30/20 0434 07/30/20 0434 07/31/20 0408 08/01/20 0524 08/02/20 0433 08/03/20 0338 08/04/20 0446  NA 126*   < > 129* 129* 126* 128* 127*  K 4.6   < > 4.1 4.0 4.5 4.1 4.2  CL 91*  --   --  93* 91* 96* 93*  CO2 25  --   --  26 26 24 24   GLUCOSE 184*  --   --  165* 183* 162* 148*  BUN 23  --   --  24* 26* 21 21  CREATININE 0.47  --   --  0.48 0.67 0.56 0.60  CALCIUM 8.7*  --   --  8.9 9.1 8.6* 8.9   < > = values in this interval not displayed.   GFR: Estimated Creatinine Clearance: 56.1 mL/min (by C-G formula based on SCr of 0.6 mg/dL). Liver Function Tests: No results for input(s): AST, ALT, ALKPHOS, BILITOT, PROT, ALBUMIN in the last 168 hours. No results for input(s): LIPASE, AMYLASE in the last 168 hours. No results for input(s): AMMONIA in the last 168 hours. Coagulation Profile: No results for input(s): INR, PROTIME in the last 168 hours. Cardiac Enzymes: No results for input(s): CKTOTAL, CKMB, CKMBINDEX, TROPONINI in the last 168 hours. BNP (last 3 results) No results for input(s): PROBNP in the last 8760 hours. HbA1C: No results for input(s): HGBA1C in the last 72 hours. CBG: Recent Labs  Lab 08/03/20 1151 08/03/20 1653 08/03/20 1955 08/03/20 2324 08/04/20 0427  GLUCAP 197* 151* 173* 158* 157*   Lipid Profile: No results for input(s): CHOL, HDL, LDLCALC, TRIG, CHOLHDL, LDLDIRECT in the last 72 hours. Thyroid Function Tests: No results for input(s): TSH, T4TOTAL, FREET4, T3FREE, THYROIDAB in the last 72 hours. Anemia Panel: No results for input(s): VITAMINB12, FOLATE, FERRITIN, TIBC, IRON, RETICCTPCT in the last 72 hours. Sepsis Labs: No results for input(s): PROCALCITON, LATICACIDVEN in the last 168 hours.  Recent Results (from the past 240 hour(s))  Urine Culture      Status: None   Collection Time: 07/31/20 11:26 PM   Specimen: Urine, Clean Catch  Result Value Ref Range Status   Specimen Description   Final    URINE, CLEAN CATCH Performed at [facility], [address], [place], [place]    Special Requests   Final    NONE Performed at [facility], [address], [place], [place]    Culture   Final    NO GROWTH Performed at [facility], [address], [place], [place]    Report Status 08/01/2020 FINAL  Final         Radiology  Studies: [place] EKG SITE RITE  Result Date: 08/03/2020 If Site Rite image not attached, placement could not be confirmed due to current cardiac rhythm.       Scheduled Meds: . Chlorhexidine Gluconate Cloth  6 each Topical Daily  . docusate  100 mg Per Tube BID  . enoxaparin (LOVENOX) injection  40 mg Subcutaneous Q24H  . feeding supplement (KATE FARMS STANDARD 1.4)  325 mL Oral BID BM  . Ferrous Fumarate  1 tablet Oral BID  . free water  20 mL Per Tube Q6H  . insulin aspart  0-9 Units Subcutaneous Q4H  . lactulose  20 g Oral BID  . liver oil-zinc oxide   Topical BID  . melatonin  5 mg Oral QHS  . metoprolol tartrate  12.5 mg Per Tube BID  . modafinil  100 mg Oral Daily  . multivitamin  15 mL Per Tube Daily  . pantoprazole sodium  40 mg Per Tube Daily  . rosuvastatin  20 mg Oral Daily  . sodium chloride flush  3 mL Intravenous Q12H  . tamsulosin  0.4 mg Oral QPC supper  . thiamine  100 mg Oral Daily   Continuous Infusions: . sodium chloride 10 mL/hr at 07/30/20 0400  . sodium chloride 75 mL/hr at 08/03/20 2338  . feeding supplement (VITAL AF 1.2 CAL) 60 mL/hr at 08/04/20 0600     LOS: 22 days    Time spent: 31 mins    Charise Killian, MD [facility] Pager [phone]  If 7PM-7AM, please contact night-coverage [email or web address] 08/04/2020, 7:32 AM

## 2020-08-04 NOTE — Progress Notes (Signed)
Updated Bjorn Loser at [facility] transfer center. Patient still awaiting bed.

## 2020-08-04 NOTE — Progress Notes (Signed)
Casey Baldwin  MRN: [id number]  DOB/AGE: 81-Dec-1942 81 y.o.  Primary Care Physician:[name], Coralie Common, MD  Admit date: 07/12/2020  Chief Complaint:  Chief Complaint  Patient presents with  . Chest Pain  . Insect Bite    S-Pt presented on  07/12/2020 with  Chief Complaint  Patient presents with  . Chest Pain  . Insect Bite   Patient is lying comfortably in the bed, patient remains confused, unable to offer any new complaints   Medications   . Chlorhexidine Gluconate Cloth  6 each Topical Daily  . docusate  100 mg Per Tube BID  . enoxaparin (LOVENOX) injection  40 mg Subcutaneous Q24H  . feeding supplement (KATE FARMS STANDARD 1.4)  325 mL Oral BID BM  . Ferrous Fumarate  1 tablet Oral BID  . free water  20 mL Per Tube Q6H  . insulin aspart  0-9 Units Subcutaneous Q4H  . lactulose  20 g Oral BID  . liver oil-zinc oxide   Topical BID  . melatonin  5 mg Oral QHS  . metoprolol tartrate  12.5 mg Per Tube BID  . modafinil  100 mg Oral Daily  . multivitamin  15 mL Per Tube Daily  . pantoprazole sodium  40 mg Per Tube Daily  . rosuvastatin  20 mg Oral Daily  . sodium chloride flush  3 mL Intravenous Q12H  . tamsulosin  0.4 mg Oral QPC supper  . thiamine  100 mg Oral Daily         HER:DEYCXK to get any data   Physical Exam: Vital signs in last 24 hours: Temp:  [97.6 F (36.4 C)-98.4 F (36.9 C)] 97.6 F (36.4 C) (09/25 1208) Pulse Rate:  [82-105] 89 (09/25 1208) Resp:  [16-18] 18 (09/25 1208) BP: (146-170)/(80-102) 148/81 (09/25 1208) SpO2:  [95 %-98 %] 95 % (09/25 1208) Weight:  [76.4 kg] 76.4 kg (09/25 0610) Weight change: -5.069 kg Last BM Date: 08/03/20  Intake/Output from previous day: 09/24 0701 - 09/25 0700 In: 940 [NG/GT:940] Out: 3000 [Urine:3000] Total I/O In: 60 [NG/GT:60] Out: 825 [Urine:825]   Physical Exam: General- pt is awake, but not oriented Resp- No acute REsp distress, CTA B/L NO Rhonchi CVS- S1S2 regular in rate and rhythm GIT-  BS+, soft, NT, ND EXT- NO LE Edema, Cyanosis   Lab Results: CBC Recent Labs    08/03/20 0338 08/04/20 0446  WBC 7.8 9.8  HGB 9.1* 9.6*  HCT 25.8* 27.1*  PLT 326 381    BMET Recent Labs    08/03/20 0338 08/04/20 0446  NA 128* 127*  K 4.1 4.2  CL 96* 93*  CO2 24 24  GLUCOSE 162* 148*  BUN 21 21  CREATININE 0.56 0.60  CALCIUM 8.6* 8.9   Sodium trend 133==>125==>129==>127   MICRO Recent Results (from the past 240 hour(s))  Urine Culture     Status: None   Collection Time: 07/31/20 11:26 PM   Specimen: Urine, Clean Catch  Result Value Ref Range Status   Specimen Description   Final    URINE, CLEAN CATCH Performed at [facility], [address] [place], [place] [address]    Special Requests   Final    NONE Performed at [facility], [address], [place], [place] [address]    Culture   Final    NO GROWTH Performed at [facility], [address] [place], [place] [address]    Report Status 08/01/2020 FINAL  Final  Lab Results  Component Value Date   CALCIUM 8.9 08/04/2020   PHOS 3.9 07/25/2020         Results for GRISSEL, Baldwin (MRN [id number]) as of 08/04/2020 16:23  Ref. Range 08/04/2020 04:46  Osmolality Latest Ref Range: 275 - 295 mOsm/kg 266 (L)   Results for Casey, Baldwin (MRN [id number]) as of 08/04/2020 16:23  Ref. Range 08/04/2020 13:42  Osmolality, Urine Latest Ref Range: 300 - 900 mOsm/kg 480  Results for JNIYAH, Baldwin (MRN [id number]) as of 08/04/2020 16:23  Ref. 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, diabetes mellitus type 2, morbid obesitywho was [facility]for  Transient alteration of awareness [R40.4] RUQ pain [R10.11] Abnormal CT scan  [R93.89] Change in mental status [R41.82] SIRS (systemic inflammatory response syndrome) (HCC) [R65.10] Fever, unspecified fever cause [R50.9] Urinary tract infection with hematuria, site unspecified [N39.0, R31.9] Edema of abdominal wall [R60.0] 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 Patient's sodium is trending down with IV fluids We will ask for urine sodium urine osmolality, serum osmolality and uric acid  I then reviewed patient's urine osmolarity/urine sodium/serum osmolality results  Patient urine osmolality is higher than serum osmolality Patient has SIADH Will DC IV fluids   2)HTN Patient blood pressure is at goal for the acute state  3)Anemia of chronic disease  HGb at goal (9--11)   4) acute metabolic encephalopathy Primary team is following closely  5) chronic diastolic CHF Patient will compensated  6)Acid base Co2 at goal     Plan:   Will DC IV fluids We will follow sodium    Casey Baldwin s Casey Baldwin 08/04/2020, 4:22 PM

## 2020-08-05 LAB — CBC
HCT: 27.6 % — ABNORMAL LOW (ref 36.0–46.0)
Hemoglobin: 9.9 g/dL — ABNORMAL LOW (ref 12.0–15.0)
MCH: 34 pg (ref 26.0–34.0)
MCHC: 35.9 g/dL (ref 30.0–36.0)
MCV: 94.8 fL (ref 80.0–100.0)
Platelets: 389 10*3/uL (ref 150–400)
RBC: 2.91 MIL/uL — ABNORMAL LOW (ref 3.87–5.11)
RDW: 13.8 % (ref 11.5–15.5)
WBC: 10.9 10*3/uL — ABNORMAL HIGH (ref 4.0–10.5)
nRBC: 0 % (ref 0.0–0.2)

## 2020-08-05 LAB — BASIC METABOLIC PANEL
Anion gap: 9 (ref 5–15)
BUN: 25 mg/dL — ABNORMAL HIGH (ref 8–23)
CO2: 25 mmol/L (ref 22–32)
Calcium: 8.8 mg/dL — ABNORMAL LOW (ref 8.9–10.3)
Chloride: 93 mmol/L — ABNORMAL LOW (ref 98–111)
Creatinine, Ser: 0.49 mg/dL (ref 0.44–1.00)
GFR calc Af Amer: >60 mL/min (ref >60)
GFR calc non Af Amer: >60 mL/min (ref >60)
Glucose, Bld: 156 mg/dL — ABNORMAL HIGH (ref 70–99)
Potassium: 4.4 mmol/L (ref 3.5–5.1)
Sodium: 127 mmol/L — ABNORMAL LOW (ref 135–145)

## 2020-08-05 LAB — T4, FREE: Free T4: 0.8 ng/dL (ref 0.61–1.12)

## 2020-08-05 LAB — GLUCOSE, CAPILLARY
Glucose-Capillary: 143 mg/dL — ABNORMAL HIGH (ref 70–99)
Glucose-Capillary: 150 mg/dL — ABNORMAL HIGH (ref 70–99)
Glucose-Capillary: 155 mg/dL — ABNORMAL HIGH (ref 70–99)
Glucose-Capillary: 160 mg/dL — ABNORMAL HIGH (ref 70–99)
Glucose-Capillary: 161 mg/dL — ABNORMAL HIGH (ref 70–99)
Glucose-Capillary: 177 mg/dL — ABNORMAL HIGH (ref 70–99)

## 2020-08-05 LAB — TSH: TSH: 7.738 u[IU]/mL — ABNORMAL HIGH (ref 0.350–4.500)

## 2020-08-05 LAB — FOLATE: Folate: 17.4 ng/mL (ref >5.9)

## 2020-08-05 NOTE — Plan of Care (Signed)

## 2020-08-05 NOTE — Progress Notes (Signed)
Casey Baldwin  MRN: [id number]  DOB/AGE: 1940/12/19 79 y.o.  Primary Care Physician:[name], Coralie Common, MD  Admit date: 07/12/2020  Chief Complaint:  Chief Complaint  Patient presents with  . Chest Pain  . Insect Bite    S-Pt presented on  07/12/2020 with  Chief Complaint  Patient presents with  . Chest Pain  . Insect Bite   Patient is lying comfortably in the bed, patient remains confused, unable to offer any new complaints Patient's daughter was present in the room today.  Patient daughter asked relevant question about the sodium.  I answered patient's daughter question to the best of my ability.  Medications   . Chlorhexidine Gluconate Cloth  6 each Topical Daily  . docusate  100 mg Per Tube BID  . enoxaparin (LOVENOX) injection  40 mg Subcutaneous Q24H  . feeding supplement (KATE FARMS STANDARD 1.4)  325 mL Oral BID BM  . Ferrous Fumarate  1 tablet Oral BID  . free water  20 mL Per Tube Q6H  . insulin aspart  0-9 Units Subcutaneous Q4H  . lactulose  20 g Oral BID  . liver oil-zinc oxide   Topical BID  . melatonin  5 mg Oral QHS  . metoprolol tartrate  12.5 mg Per Tube BID  . modafinil  100 mg Oral Daily  . multivitamin  15 mL Per Tube Daily  . pantoprazole sodium  40 mg Per Tube Daily  . rosuvastatin  20 mg Oral Daily  . sodium chloride flush  3 mL Intravenous Q12H  . tamsulosin  0.4 mg Oral QPC supper  . thiamine  100 mg Oral Daily         TDV:VOHYWV to get any data   Physical Exam: Vital signs in last 24 hours: Temp:  [97.5 F (36.4 C)-98.7 F (37.1 C)] 98.7 F (37.1 C) (09/26 0911) Pulse Rate:  [86-96] 86 (09/26 0911) Resp:  [17-20] 17 (09/26 0401) BP: (145-165)/(78-87) 149/81 (09/26 0911) SpO2:  [95 %-98 %] 97 % (09/26 0911) Weight:  [78.8 kg] 78.8 kg (09/26 0500) Weight change: 2.359 kg Last BM Date: 08/04/20  Intake/Output from previous day: 09/25 0701 - 09/26 0700 In: 60 [NG/GT:60] Out: 2025 [Urine:2025] No intake/output data  recorded.   Physical Exam: General- pt is awake, but not oriented Resp- No acute REsp distress, CTA B/L NO Rhonchi CVS- S1S2 regular in rate and rhythm GIT- BS+, soft, NT, ND EXT- NO LE Edema, Cyanosis   Lab Results: CBC Recent Labs    08/04/20 0446 08/05/20 0503  WBC 9.8 10.9*  HGB 9.6* 9.9*  HCT 27.1* 27.6*  PLT 381 389    BMET Recent Labs    08/04/20 0446 08/05/20 0503  NA 127* 127*  K 4.2 4.4  CL 93* 93*  CO2 24 25  GLUCOSE 148* 156*  BUN 21 25*  CREATININE 0.60 0.49  CALCIUM 8.9 8.8*   Sodium trend 133==>125==>129==>127   MICRO Recent Results (from the past 240 hour(s))  Urine Culture     Status: None   Collection Time: 07/31/20 11:26 PM   Specimen: Urine, Clean Catch  Result Value Ref Range Status   Specimen Description   Final    URINE, CLEAN CATCH Performed at [facility], [address] [place], [place] [address]    Special Requests   Final    NONE Performed at [facility], [address], [place], [place] [address]    Culture   Final    NO GROWTH Performed at  [facility], [address], [place], [place] [address]    Report Status 08/01/2020 FINAL  Final      Lab Results  Component Value Date   CALCIUM 8.8 (L) 08/05/2020   PHOS 3.9 07/25/2020         Results for Casey Baldwin, Casey Baldwin (MRN [id number]) as of 08/04/2020 16:23  Ref. Range 08/04/2020 04:46  Osmolality Latest Ref Range: 275 - 295 mOsm/kg 266 (L)   Results for Casey Baldwin, Casey Baldwin (MRN [id number]) as of 08/04/2020 16:23  Ref. Range 08/04/2020 13:42  Osmolality, Urine Latest Ref Range: 300 - 900 mOsm/kg 480  Results for Casey Baldwin, Casey Baldwin (MRN [id number]) as of 08/04/2020 16:23  Ref. 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, diabetes mellitus type 2, morbid obesitywho was admitted to [facility]for  Transient alteration of awareness [R40.4] RUQ pain [R10.11] Abnormal CT scan [R93.89] Change in mental status [R41.82] SIRS (systemic inflammatory response syndrome) (HCC) [R65.10] Fever, unspecified fever cause [R50.9] Urinary tract infection with hematuria, site unspecified [N39.0, R31.9] Edema of abdominal wall [R60.0] 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 Patient's sodium is trending down with IV fluids l asked for urine sodium urine osmolality, serum osmolality and uric acid I then reviewed patient's urine osmolarity/urine sodium/serum osmolality results  Patient urine osmolality is higher than serum osmolality Patient has SIADH I  DCed IV fluids   2)HTN Patient blood pressure is at goal for the acute state  3)Anemia of chronic disease  HGb at goal (9--11)   4) Acute metabolic encephalopathy Primary team is following closely  5) chronic diastolic CHF Patient will compensated  6)Acid base Co2 at goal     Plan:    We will follow sodium, now stable    Pragya Lofaso s [facility] 08/05/2020, 10:28 AM

## 2020-08-05 NOTE — Progress Notes (Signed)
PROGRESS NOTE    Casey Baldwin  RJJ:[id number] DOB: Feb 06, 1941 DOA: 07/12/2020 PCP: Lesly Rubenstein, MD   Assessment & Plan:   Active Problems:   SIRS (systemic inflammatory response syndrome) (HCC)   Shingles rash   Delirium   Acute metabolic encephalopathy   Essential hypertension   Anemia   Acute lower UTI   Chronic diastolic CHF (congestive heart failure) (HCC)   Sepsis (HCC)   Change in mental status   Anorexia   Malnutrition of moderate degree   Acute metabolic encephalopathy: etiology unclear, meningitis vs delirium. Mental status is labile. Agitated today. S/p LP on 9/14. MRA/MRI head no acute intracranial abnormalities. Abxs were d/c. EEGs were neg for seizures or epileptiform discharges. CSF neg for HSV, West Nile, autoimmune encephalitis, enterovirus. 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 Completed acyclovir 21 day course. Pt's daughter refused provigil which was initially started by neuro   Shingles: w/ aseptic meningitis. Completed a 21 day course of acyclovir   PSVT: will continue on metoprolol  Poor po intake: unchanged. Continue w/ tube feeds which was initially placed on 07/23/20. Not a permanent solution for poor po intake but unsure if PEG tube would be either, considering pt's extensive abd surg hx including gastric bypass which was initially done in 2008. If unchanged on 08/06/20 then will discuss permanent feeding options w/ IR. Pt's daughter wants to discuss w/ family in regards to getting a PEG tube or not. 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 Na is labile. Will continue to monitor   Sepsis: resolved  Intra-abdominal fluid collection: w/ CT showing fluid collection likely pseudocyst, seroma or biloma, not abscess. IR did not recommend aspiration   Urinary retention: continue w/ foley. Will do a voiding trial when pt is more oriented   UTI: completed abx course.  Resolved  Chronic diastolic CHF: w/o exacerbation. 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 Will discuss w/ IR tomorrow if pt would be candidate or not for PEG and pt's daughter will discuss w/ fam in regards to PEG tube    Dispo: The patient is from: Home              Anticipated d/c is to: SNF              Anticipated d/c date is: > 3 days              Patient currently is not medically stable to d/c.      Consultants:   ID  nephro  neuro    Procedures:    Antimicrobials:    Subjective: Pt is awake but agitated and still not answering any questions.  Objective: Vitals:   08/04/20 2027 08/04/20 2357 08/05/20 0401 08/05/20 0500  BP: (!) 152/83 (!) 149/87 (!) 165/78   Pulse: 96 87 91   Resp: 20 19 17    Temp: 97.9 F (36.6 C) (!) 97.5 F (36.4 C) 98.3 F (36.8 C)   TempSrc: Oral Oral Oral   SpO2: 96% 96% 98%   Weight:    78.8 kg  Height:  Intake/Output Summary (Last 24 hours) at 08/05/2020 0723 Last data filed at 08/05/2020 0500 Gross per 24 hour  Intake 60 ml  Output 2025 ml  Net -1965 ml   Filed Weights   08/03/20 0413 08/04/20 0610 08/05/20 0500  Weight: 81.5 kg 76.4 kg 78.8 kg    Examination:  General exam: Appears agitated  Respiratory system: diminished breath sounds b/l. No wheezes. Cardiovascular system:  S1/S2+. No rubs or gallops  Gastrointestinal system: Abd is soft, non-distended, non-tender, & hyperactive bowel sounds  Central nervous system: Awake. Unable to follow simple commands. Moves all 4 extremities  Psychiatry: Judgement and insight appear abnormal.  Agitated     Data Reviewed: I have personally reviewed following labs and imaging studies  CBC: Recent Labs  Lab 08/01/20 0524 08/02/20 0433 08/03/20 0338 08/04/20 0446 08/05/20 0503  WBC 7.5 8.1 7.8 9.8 10.9*  HGB 8.9* 9.4* 9.1* 9.6* 9.9*  HCT 25.6* 27.8* 25.8* 27.1* 27.6*  MCV 94.1 97.2 94.9 94.4 94.8  PLT 295 327 326 381 389   Basic Metabolic Panel: Recent Labs  Lab 08/01/20 0524 08/02/20 0433 08/03/20 0338 08/04/20 0446 08/05/20 0503  NA 129* 126* 128* 127* 127*  K 4.0 4.5 4.1 4.2 4.4  CL 93* 91* 96* 93* 93*  CO2 26 26 24 24 25   GLUCOSE 165* 183* 162* 148* 156*  BUN 24* 26* 21 21 25*  CREATININE 0.48 0.67 0.56 0.60 0.49  CALCIUM 8.9 9.1 8.6* 8.9 8.8*   GFR: Estimated Creatinine Clearance: 56.9 mL/min (by C-G formula based on SCr of 0.49 mg/dL). Liver Function Tests: No results for input(s): AST, ALT, ALKPHOS, BILITOT, PROT, ALBUMIN in the last 168 hours. No results for input(s): LIPASE, AMYLASE in the last 168 hours. No results for input(s): AMMONIA in the last 168 hours. Coagulation Profile: No results for input(s): INR, PROTIME in the last 168 hours. Cardiac Enzymes: No results for input(s): CKTOTAL, CKMB, CKMBINDEX, TROPONINI in the last 168 hours. BNP (last 3 results) No results for input(s): PROBNP in the last 8760 hours. HbA1C: No results for input(s): HGBA1C in the last 72 hours. CBG: Recent Labs  Lab 08/04/20 1210 08/04/20 1728 08/04/20 2029 08/05/20 0002 08/05/20 0403  GLUCAP 138* 138* 177* 155* 150*   Lipid Profile: No results for input(s): CHOL, HDL, LDLCALC, TRIG, CHOLHDL, LDLDIRECT in the last 72 hours. Thyroid Function Tests: No results for input(s): TSH, T4TOTAL, FREET4, T3FREE, THYROIDAB in the last 72 hours. Anemia Panel: No results for input(s): VITAMINB12, FOLATE, FERRITIN, TIBC, IRON, RETICCTPCT in the last 72 hours. Sepsis Labs: No results for input(s): PROCALCITON, LATICACIDVEN in the last 168 hours.  Recent Results (from  the past 240 hour(s))  Urine Culture     Status: None   Collection Time: 07/31/20 11:26 PM   Specimen: Urine, Clean Catch  Result Value Ref Range Status   Specimen Description   Final    URINE, CLEAN CATCH Performed at [facility], [address], [place], [place]    Special Requests   Final    NONE Performed at [facility], [address] [place], [place]    Culture   Final    NO GROWTH Performed at [facility], [address] [place], [place]    Report Status 08/01/2020 FINAL  Final         Radiology Studies: 08/03/2020 EKG SITE RITE  Result Date: 08/03/2020 If Site Rite image not attached, placement could not be confirmed due to  current cardiac rhythm.       Scheduled Meds: . Chlorhexidine Gluconate Cloth  6 each Topical Daily  . docusate  100 mg Per Tube BID  . enoxaparin (LOVENOX) injection  40 mg Subcutaneous Q24H  . feeding supplement (KATE FARMS STANDARD 1.4)  325 mL Oral BID BM  . Ferrous Fumarate  1 tablet Oral BID  . free water  20 mL Per Tube Q6H  . insulin aspart  0-9 Units Subcutaneous Q4H  . lactulose  20 g Oral BID  . liver oil-zinc oxide   Topical BID  . melatonin  5 mg Oral QHS  . metoprolol tartrate  12.5 mg Per Tube BID  . modafinil  100 mg Oral Daily  . multivitamin  15 mL Per Tube Daily  . pantoprazole sodium  40 mg Per Tube Daily  . rosuvastatin  20 mg Oral Daily  . sodium chloride flush  3 mL Intravenous Q12H  . tamsulosin  0.4 mg Oral QPC supper  . thiamine  100 mg Oral Daily   Continuous Infusions: . sodium chloride 10 mL/hr at 07/30/20 0400  . feeding supplement (VITAL AF 1.2 CAL) 1,000 mL (08/04/20 1100)     LOS: 23 days    Time spent: 35 mins    Charise Killian, MD [facility] Pager [phone]  If 7PM-7AM, please contact night-coverage [email or web address] 08/05/2020, 7:23 AM

## 2020-08-06 ENCOUNTER — Inpatient Hospital Stay: Payer: Medicare Other

## 2020-08-06 DIAGNOSIS — B029 Zoster without complications: Secondary | ICD-10-CM | POA: Diagnosis not present

## 2020-08-06 DIAGNOSIS — R079 Chest pain, unspecified: Secondary | ICD-10-CM | POA: Diagnosis not present

## 2020-08-06 DIAGNOSIS — R509 Fever, unspecified: Secondary | ICD-10-CM | POA: Diagnosis not present

## 2020-08-06 DIAGNOSIS — R519 Headache, unspecified: Secondary | ICD-10-CM | POA: Diagnosis not present

## 2020-08-06 LAB — BASIC METABOLIC PANEL
Anion gap: 9 (ref 5–15)
BUN: 26 mg/dL — ABNORMAL HIGH (ref 8–23)
CO2: 27 mmol/L (ref 22–32)
Calcium: 9.1 mg/dL (ref 8.9–10.3)
Chloride: 91 mmol/L — ABNORMAL LOW (ref 98–111)
Creatinine, Ser: 0.57 mg/dL (ref 0.44–1.00)
GFR calc Af Amer: >60 mL/min (ref >60)
GFR calc non Af Amer: >60 mL/min (ref >60)
Glucose, Bld: 152 mg/dL — ABNORMAL HIGH (ref 70–99)
Potassium: 4.4 mmol/L (ref 3.5–5.1)
Sodium: 127 mmol/L — ABNORMAL LOW (ref 135–145)

## 2020-08-06 LAB — GLUCOSE, CAPILLARY
Glucose-Capillary: 146 mg/dL — ABNORMAL HIGH (ref 70–99)
Glucose-Capillary: 154 mg/dL — ABNORMAL HIGH (ref 70–99)
Glucose-Capillary: 160 mg/dL — ABNORMAL HIGH (ref 70–99)
Glucose-Capillary: 160 mg/dL — ABNORMAL HIGH (ref 70–99)
Glucose-Capillary: 182 mg/dL — ABNORMAL HIGH (ref 70–99)
Glucose-Capillary: 151 mg/dL — ABNORMAL HIGH (ref 70–99)

## 2020-08-06 LAB — VITAMIN B12: Vitamin B-12: 444 pg/mL (ref 180–914)

## 2020-08-06 LAB — CBC
HCT: 28.8 % — ABNORMAL LOW (ref 36.0–46.0)
Hemoglobin: 10.3 g/dL — ABNORMAL LOW (ref 12.0–15.0)
MCH: 33.7 pg (ref 26.0–34.0)
MCHC: 35.8 g/dL (ref 30.0–36.0)
MCV: 94.1 fL (ref 80.0–100.0)
Platelets: 403 10*3/uL — ABNORMAL HIGH (ref 150–400)
RBC: 3.06 MIL/uL — ABNORMAL LOW (ref 3.87–5.11)
RDW: 14.3 % (ref 11.5–15.5)
WBC: 11.2 10*3/uL — ABNORMAL HIGH (ref 4.0–10.5)
nRBC: 0 % (ref 0.0–0.2)

## 2020-08-06 NOTE — Progress Notes (Signed)
NG tube placement verified.  Ok to use per Anna Genre, NP.

## 2020-08-06 NOTE — Progress Notes (Signed)
Upon performing rounds, noted that patients NG tube was pulled out to about 40 or 45 from 55. Tube feeds stopped. Tube advanced and re-taped. Patient does not appear to be in any distress at this time. Provider notified and scan requested to verify placement and ensure that tube feed did not flow into lungs.  Order placed by provider.  Will continue to monitor.

## 2020-08-06 NOTE — Progress Notes (Signed)
PROGRESS NOTE    Casey Baldwin  ZOX:[id number] DOB: 1941-06-24 DOA: 07/12/2020 PCP: Lesly Rubenstein, MD   Assessment & Plan:   Active Problems:   SIRS (systemic inflammatory response syndrome) (HCC)   Shingles rash   Delirium   Acute metabolic encephalopathy   Essential hypertension   Anemia   Acute lower UTI   Chronic diastolic CHF (congestive heart failure) (HCC)   Sepsis (HCC)   Change in mental status   Anorexia   Malnutrition of moderate degree   Acute metabolic encephalopathy: etiology unclear, meningitis vs delirium. Mental status is labile. Mental status is slightly improved today. S/p LP on 9/14. MRA/MRI head no acute intracranial abnormalities. Abxs were d/c. EEGs were neg for seizures or epileptiform discharges. CSF neg for HSV, West Nile, autoimmune encephalitis, enterovirus. 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 Completed acyclovir 21 day course. Pt's daughter refused provigil which was initially started by neuro   Shingles: w/ aseptic meningitis. Completed a 21 day course of acyclovir   PSVT: will continue on metoprolol  Poor po intake: unchanged. Continue w/ tube feeds which was initially placed on 07/23/20. Not a permanent solution for poor po intake but unsure if PEG tube would be either, considering pt's extensive abd surg hx including gastric bypass which was initially done in 2008. Pt's daughter is still discussing with family in regards to PEG tube. 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 Na level is labile. Will continue to monitor    Thrombocytosis: likely reactive.   Sepsis: resolved  Intra-abdominal fluid collection: w/ CT showing fluid collection likely pseudocyst, seroma or biloma, not abscess. IR did not recommend aspiration   Urinary retention: continue w/ foley. Will do a voiding trial when pt is more oriented   UTI: completed abx course. Resolved  Chronic  diastolic CHF: w/o exacerbation. 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 Pt's daughter is still discussing PEG tube placement with family    Dispo: The patient is from: Home              Anticipated d/c is to: SNF              Anticipated d/c date is: > 3 days              Patient currently is not medically stable to d/c.      Consultants:   ID  nephro  neuro    Procedures:    Antimicrobials:    Subjective: Pt is more awake today but still does not answer questions appropriately.   Objective: Vitals:   08/05/20 0500 08/05/20 0911 08/05/20 1645 08/06/20 0052  BP:  (!) 149/81 (!) 158/84 138/86  Pulse:  86 86 100  Resp:   20 20  Temp:  98.7 F (37.1 C) 98.1 F (36.7 C) 98.2 F (36.8 C)  TempSrc:  Oral Oral Oral  SpO2:  97% 97% 97%  Weight: 78.8 kg     Height:        Intake/Output Summary (Last 24 hours) at 08/06/2020 0736 Last data filed at 08/06/2020 0001 Gross  per 24 hour  Intake 304 ml  Output 850 ml  Net -546 ml   Filed Weights   08/03/20 0413 08/04/20 0610 08/05/20 0500  Weight: 81.5 kg 76.4 kg 78.8 kg    Examination:  General exam: Appears calm but still confused  Respiratory system: decreased breath sounds b/l  Cardiovascular system:  S1/S2+. No rubs or gallops  Gastrointestinal system: Abd is soft, non-distended, non-tender, & hyperactive bowel sounds  Central nervous system: Awake. Unable answer questions appropriately. Moves all 4 extremities  Psychiatry: Judgement and insight appear abnormal. Flat mood and affect      Data Reviewed: I have  personally reviewed following labs and imaging studies  CBC: Recent Labs  Lab 08/02/20 0433 08/03/20 0338 08/04/20 0446 08/05/20 0503 08/06/20 0418  WBC 8.1 7.8 9.8 10.9* 11.2*  HGB 9.4* 9.1* 9.6* 9.9* 10.3*  HCT 27.8* 25.8* 27.1* 27.6* 28.8*  MCV 97.2 94.9 94.4 94.8 94.1  PLT 327 326 381 389 403*   Basic Metabolic Panel: Recent Labs  Lab 08/02/20 0433 08/03/20 0338 08/04/20 0446 08/05/20 0503 08/06/20 0418  NA 126* 128* 127* 127* 127*  K 4.5 4.1 4.2 4.4 4.4  CL 91* 96* 93* 93* 91*  CO2 26 24 24 25 27   GLUCOSE 183* 162* 148* 156* 152*  BUN 26* 21 21 25* 26*  CREATININE 0.67 0.56 0.60 0.49 0.57  CALCIUM 9.1 8.6* 8.9 8.8* 9.1   GFR: Estimated Creatinine Clearance: 56.9 mL/min (by C-G formula based on SCr of 0.57 mg/dL). Liver Function Tests: No results for input(s): AST, ALT, ALKPHOS, BILITOT, PROT, ALBUMIN in the last 168 hours. No results for input(s): LIPASE, AMYLASE in the last 168 hours. No results for input(s): AMMONIA in the last 168 hours. Coagulation Profile: No results for input(s): INR, PROTIME in the last 168 hours. Cardiac Enzymes: No results for input(s): CKTOTAL, CKMB, CKMBINDEX, TROPONINI in the last 168 hours. BNP (last 3 results) No results for input(s): PROBNP in the last 8760 hours. HbA1C: No results for input(s): HGBA1C in the last 72 hours. CBG: Recent Labs  Lab 08/05/20 1154 08/05/20 1656 08/05/20 2125 08/06/20 0151 08/06/20 0626  GLUCAP 161* 143* 177* 146* 154*   Lipid Profile: No results for input(s): CHOL, HDL, LDLCALC, TRIG, CHOLHDL, LDLDIRECT in the last 72 hours. Thyroid Function Tests: Recent Labs    08/05/20 0503  TSH 7.738*  FREET4 0.80   Anemia Panel: Recent Labs    08/05/20 0503  FOLATE 17.4   Sepsis Labs: No results for input(s): PROCALCITON, LATICACIDVEN in the last 168 hours.  Recent Results (from the past 240 hour(s))  Urine Culture     Status: None   Collection Time: 07/31/20 11:26 PM   Specimen: Urine,  Clean Catch  Result Value Ref Range Status   Specimen Description   Final    URINE, CLEAN CATCH Performed at [facility], [address], [place], [place]    Special Requests   Final    NONE Performed at [facility], [address], [place], [place]    Culture   Final    NO GROWTH Performed at [facility], [address], [place], [place]    Report Status 08/01/2020 FINAL  Final         Radiology Studies: DG Abd 1 View  Result Date: 08/06/2020 CLINICAL DATA:  NG tube placement EXAM: ABDOMEN - 1 VIEW COMPARISON:  07/23/2020 FINDINGS: An enteric tube is present with tip in the left  upper quadrant consistent with location in the upper stomach. Postoperative changes in the upper abdomen. Scattered gas and stool in the visualized colon. Linear atelectasis in the lung bases. Calcification of the aorta. IMPRESSION: Enteric tube tip projects over the upper stomach. Electronically Signed   By: Burman Nieves M.D.   On: 08/06/2020 00:57        Scheduled Meds: . Chlorhexidine Gluconate Cloth  6 each Topical Daily  . docusate  100 mg Per Tube BID  . enoxaparin (LOVENOX) injection  40 mg Subcutaneous Q24H  . feeding supplement (KATE FARMS STANDARD 1.4)  325 mL Oral BID BM  . Ferrous Fumarate  1 tablet Oral BID  . free water  20 mL Per Tube Q6H  . insulin aspart  0-9 Units Subcutaneous Q4H  . lactulose  20 g Oral BID  . liver oil-zinc oxide   Topical BID  . melatonin  5 mg Oral QHS  . metoprolol tartrate  12.5 mg Per Tube BID  . modafinil  100 mg Oral Daily  . multivitamin  15 mL Per Tube Daily  . pantoprazole sodium  40 mg Per Tube Daily  . rosuvastatin  20 mg Oral Daily  . sodium chloride flush  3 mL Intravenous Q12H  . tamsulosin  0.4 mg Oral QPC supper  . thiamine  100 mg Oral Daily   Continuous Infusions: . sodium chloride 10 mL/hr at 07/30/20 0400  . feeding supplement (VITAL AF 1.2 CAL) 60 mL/hr at 08/06/20  0332     LOS: 24 days    Time spent: 33 mins    Charise Killian, MD [facility] Pager [phone]  If 7PM-7AM, please contact night-coverage [email or web address] 08/06/2020, 7:36 AM

## 2020-08-06 NOTE — Progress Notes (Signed)
Physical Therapy Treatment Patient Details Name: Casey Baldwin MRN: [id number] DOB: 1941-10-25 Today's Date: 08/06/2020    History of Present Illness Casey Baldwin is a 79 y.o. 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  Presented to ER secondary to generalized body aches, progressive weakness, headaches; admitted for management of SIRS (urine?), shingles and acute metabolic encephalopathy.    PT Comments    Pt awake upon arrival and agrees to session.  X 10 supine ex BLE.  She does cry out multiple times with ex primarily for R knee.  She has no redness, warmth, edema, bruising (-) Homans.  She is able to tolerate with slow movements and is comfortable at rest.  While awaiting +2 assist for mobility, initiated second set of ex.  Pt closes her eyes and makes no attempt to assist further with exercises despite encouragement.  She eventually does not respond or interact with me despite verbal and tactile cues and appears to be sleeping.  Mobility deferred for pt and staff safety.  Eyes briefly open as I was covering her back up and putting pressure relief boots back on but again closes her eyes.  Unable to tell if pt is sleeping or if by choice not assisting or participating.    Follow Up Recommendations  SNF     Equipment Recommendations  Rolling walker with 5" wheels    Recommendations for Other Services       Precautions / Restrictions Precautions Precautions: Fall Restrictions Weight Bearing Restrictions: No Other Position/Activity Restrictions: NG tube; HOB >30 degrees    Mobility  Bed Mobility                  Transfers                    Ambulation/Gait                 Stairs             Wheelchair Mobility    Modified Rankin (Stroke Patients Only)       Balance                                            Cognition  Arousal/Alertness: Awake/alert Behavior During Therapy: Flat affect Overall Cognitive Status: Impaired/Different from baseline Area of Impairment: Attention;Memory;Following commands;Safety/judgement;Awareness;Problem solving                 Orientation Level: Disoriented to;Place;Time;Situation Current Attention Level: Alternating;Divided Memory: Decreased recall of precautions;Decreased short-term memory Following Commands: Follows one step commands inconsistently Safety/Judgement: Decreased awareness of safety;Decreased awareness of deficits Awareness: Intellectual Problem Solving: Slow processing;Decreased initiation;Difficulty sequencing;Requires verbal cues;Requires tactile cues General Comments: Pt has poor cognition and inconsistent ability to follow commands.       Exercises Other Exercises Other Exercises: supine AAROM - anklep pumps, heel slides, and SLR x 10, PROM x 10 - no effort on second set    General Comments        Pertinent Vitals/Pain Pain Assessment: Faces Faces Pain Scale: Hurts whole lot Pain Location: R knee/LE Pain Descriptors / Indicators: Grimacing;Discomfort;Moaning Pain Intervention(s): Limited activity within patient's tolerance;Monitored during session    Home Living  Prior Function            PT Goals (current goals can now be found in the care plan section) Progress towards PT goals: Not progressing toward goals - comment    Frequency    Min 2X/week      PT Plan Current plan remains appropriate    Co-evaluation              AM-PAC PT "6 Clicks" Mobility   Outcome Measure  Help needed turning from your back to your side while in a flat bed without using bedrails?: A Lot Help needed moving from lying on your back to sitting on the side of a flat bed without using bedrails?: Total Help needed moving to and from a bed to a chair (including a wheelchair)?: Total Help needed standing up from  a chair using your arms (e.g., wheelchair or bedside chair)?: Total Help needed to walk in hospital room?: Total Help needed climbing 3-5 steps with a railing? : Total 6 Click Score: 7    End of Session Equipment Utilized During Treatment: Oxygen Activity Tolerance: Patient limited by fatigue Patient left: in bed;with call bell/phone within reach;with bed alarm set Nurse Communication: Mobility status;Precautions       Time: 7096-2836 PT Time Calculation (min) (ACUTE ONLY): 16 min  Charges:  $Therapeutic Exercise: 8-22 mins                    Danielle Dess, PTA 08/06/20, 10:46 AM

## 2020-08-06 NOTE — Progress Notes (Signed)
ID   Date of Admission:  07/12/2020   T   ID: Casey Baldwin is a 79 y.o. female Active Problems:   SIRS (systemic inflammatory response syndrome) (HCC)   Shingles rash   Delirium   Acute metabolic encephalopathy   Essential hypertension   Anemia   Acute lower UTI   Chronic diastolic CHF (congestive heart failure) (HCC)   Sepsis (HCC)   Change in mental status   Anorexia   Malnutrition of moderate degree    Subjective: Non verbal  Medications:  . Chlorhexidine Gluconate Cloth  6 each Topical Daily  . docusate  100 mg Per Tube BID  . enoxaparin (LOVENOX) injection  40 mg Subcutaneous Q24H  . feeding supplement (KATE FARMS STANDARD 1.4)  325 mL Oral BID BM  . Ferrous Fumarate  1 tablet Oral BID  . free water  20 mL Per Tube Q6H  . insulin aspart  0-9 Units Subcutaneous Q4H  . lactulose  20 g Oral BID  . liver oil-zinc oxide   Topical BID  . melatonin  5 mg Oral QHS  . metoprolol tartrate  12.5 mg Per Tube BID  . modafinil  100 mg Oral Daily  . multivitamin  15 mL Per Tube Daily  . pantoprazole sodium  40 mg Per Tube Daily  . rosuvastatin  20 mg Oral Daily  . sodium chloride flush  3 mL Intravenous Q12H  . tamsulosin  0.4 mg Oral QPC supper  . thiamine  100 mg Oral Daily    Objective: Vital signs in last 24 hours: Temp:  [97.9 F (36.6 C)-98.2 F (36.8 C)] 97.9 F (36.6 C) (09/27 0759) Pulse Rate:  [86-100] 88 (09/27 0759) Resp:  [20] 20 (09/27 0759) BP: (138-158)/(81-86) 157/81 (09/27 0759) SpO2:  [97 %] 97 % (09/27 0759)  PHYSICAL EXAM:  General:somnolent On calling her name she opens her eyes, smiles and nods Non verbal No distress Head: Normocephalic, without obvious abnormality, atraumatic. Eyes: Conjunctivae clear, anicteric sclerae. Pupils are equal ENT cannot examine Lungs: b/l air entry Heart: Regular rate and rhythm, no murmur, rub or gallop. Abdomen: Soft, non-tender,not distended. Bowel sounds normal. No masses Extremities: atraumatic, no  cyanosis. No edema. No clubbing Skin: No rashes or lesions. Or bruising Lymph: Cervical, supraclavicular normal. Neurologic:cannot be assessed Foley catheter  Lab Results Recent Labs    08/05/20 0503 08/06/20 0418  WBC 10.9* 11.2*  HGB 9.9* 10.3*  HCT 27.6* 28.8*  NA 127* 127*  K 4.4 4.4  CL 93* 91*  CO2 25 27  BUN 25* 26*  CREATININE 0.49 0.57   Studies/Results: DG Abd 1 View  Result Date: 08/06/2020 CLINICAL DATA:  NG tube placement EXAM: ABDOMEN - 1 VIEW COMPARISON:  07/23/2020 FINDINGS: An enteric tube is present with tip in the left upper quadrant consistent with location in the upper stomach. Postoperative changes in the upper abdomen. Scattered gas and stool in the visualized colon. Linear atelectasis in the lung bases. Calcification of the aorta. IMPRESSION: Enteric tube tip projects over the upper stomach. Electronically Signed   By: Burman Nieves M.D.   On: 08/06/2020 00:57     Assessment/Plan: Admitted on 9/12 with chest pain rt side, fever, headache and leg pain and apparently intermittent confusion as per daughter Found to have herpes zoster Thoracic dermatome  Herpes zoster with aseptic meningitis on admission ( patient had refused LP)- was started on IV acyclovir 10mg /Kg q 8. She was alert and verbal and appropriate on presentation but after 5-6  days gradual deterioration to obtundation. Seen by neuro- delirium was questioned -EEG on 07/20/20 no seizure activity..There were many reasons including changes in sleep rhythm, poor intake, constipation, not wearing CPAp ( but no co2 narcosis) hyponatremia for the obtundation On 9/14 Had LP which revealed lymphocytic pleocytosis So far bacterial culture neg and HSV DNA neg- VZVneg .west Nile virus IgG in csf was positive but not IgM and serum antibodies neg- so this is not acute WNV and IgG is not a reliable test for diagnosis of acute infection as there is cross reactivity with other flavi virus and dengue-    MRI and MRA done on 9/14 no acute changes. Daughter wanted her to be in [place], but no bed and hence Repeat EEG done on 07/25/20 no seizure activity noted. 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 VZV PCR neg in csf but VzV IgG > 2000 Completed 21 days of acycolvir threapy for VZV induced meningo encephalitis on 08/02/20 waking and waning mental alertness- neurology at bed side- hypodelirium She has hyponatremia - now followed by nephrologist  Urinary retention -9/12- has foley- ? Voiding trial  Rt sided abdominal firmness and fullness CT abdomen showsFluid collection at lies along the posterior margin of the pancreas, predominantly collecting along the right anterior pararenal fascia. This may reflect an abscess. It could be a pseudocyst if there is a history of pancreatitis. It may be a postoperative collection, since the patient underwent a cholecystectomy since the prior CT. Collection measures approximately 14 x 4 x 10 cm in size. repeat CT show the samecollection which is thought to be either a pseudocyst, seroma or biloma and not abscess IR does not recommend aspiration.  [facility] at [facility] until 05/14/20 for abdominal pain which was diagnosed as choledocholithiasis/cholangitis in a setting of prior RYGB in 2008. 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 and she was taken to [facility] 04/05/20 for diagnostic lap, robotic lysis of adhesions, transgastric ERCP and biliary sphincterotomy, cholecystectomy, partial gastrectomy andaccidentalcolotomy which was repaired.The immediate post op was complicated by post ERCP pancreatitis , pulmonary edema with acute hypoxic resp failure , Afib ( treated with amio and metoprolol), fever and leucocytosis  suspected due to pancreatitis and was treated with zosyn, UTI due to proteus and acute on chronic anemia needing PRBC and AKI.She was seen by gerontologist for insomnia and risk for delirium    H/o left TKA  Hearing loss  Daughter not at her bedside.

## 2020-08-06 NOTE — Progress Notes (Signed)
[facility], [place] 08/06/20  Subjective:   LOS: 24 09/26 0701 - 09/27 0700 In: 304 [NG/GT:304] Out: 850 [Urine:850]  Patient remains encephalopathic Eyes are open, makes grunting sounds but does not follow commands NG tube in place, 60 cc an hour Hands in soft mitten restraints   Objective:  Vital signs in last 24 hours:  Temp:  [97.9 F (36.6 C)-98.2 F (36.8 C)] 97.9 F (36.6 C) (09/27 0759) Pulse Rate:  [86-100] 88 (09/27 0759) Resp:  [20] 20 (09/27 0759) BP: (138-158)/(81-86) 157/81 (09/27 0759) SpO2:  [97 %] 97 % (09/27 0759)  Weight change:  Filed Weights   08/03/20 0413 08/04/20 0610 08/05/20 0500  Weight: 81.5 kg 76.4 kg 78.8 kg    Intake/Output:    Intake/Output Summary (Last 24 hours) at 08/06/2020 1541 Last data filed at 08/06/2020 1457 Gross per 24 hour  Intake 364 ml  Output 1650 ml  Net -1286 ml     Physical Exam: General:  No acute distress, laying in the bed  HEENT  NG tube in place  Pulm/lungs  normal breathing effort, no crackles  CVS/Heart  regular, no rub  Abdomen:   Soft, nontender  Extremities:  Trace edema, feet in soft boots support  Neurologic:  Eyes open, does not follow commands  Skin:  No acute rashes    Basic Metabolic Panel:  Recent Labs  Lab 08/02/20 0433 08/02/20 0433 08/03/20 0338 08/03/20 0338 08/04/20 0446 08/05/20 0503 08/06/20 0418  NA 126*  --  128*  --  127* 127* 127*  K 4.5  --  4.1  --  4.2 4.4 4.4  CL 91*  --  96*  --  93* 93* 91*  CO2 26  --  24  --  24 25 27   GLUCOSE 183*  --  162*  --  148* 156* 152*  BUN 26*  --  21  --  21 25* 26*  CREATININE 0.67  --  0.56  --  0.60 0.49 0.57  CALCIUM 9.1   < > 8.6*   < > 8.9 8.8* 9.1   < > = values in this interval not displayed.     CBC: Recent Labs  Lab 08/02/20 0433 08/03/20 0338 08/04/20 0446 08/05/20 0503 08/06/20 0418  WBC 8.1 7.8 9.8 10.9* 11.2*  HGB 9.4* 9.1* 9.6* 9.9* 10.3*  HCT 27.8* 25.8* 27.1* 27.6* 28.8*   MCV 97.2 94.9 94.4 94.8 94.1  PLT 327 326 381 389 403*     No results found for: HEPBSAG, HEPBSAB, HEPBIGM    Microbiology:  Recent Results (from the past 240 hour(s))  Urine Culture     Status: None   Collection Time: 07/31/20 11:26 PM   Specimen: Urine, Clean Catch  Result Value Ref Range Status   Specimen Description   Final    URINE, CLEAN CATCH Performed at [facility], [address] [place], [place]    Special Requests   Final    NONE Performed at [facility], [address] [place], [place]    Culture   Final    NO GROWTH Performed at [facility], [address], [place], [place]    Report Status 08/01/2020 FINAL  Final    Coagulation Studies: No results for input(s): LABPROT, INR in the last 72 hours.  Urinalysis: No results for input(s): COLORURINE, LABSPEC, PHURINE, GLUCOSEU, HGBUR, BILIRUBINUR, KETONESUR, PROTEINUR, UROBILINOGEN, NITRITE, LEUKOCYTESUR in the last 72 hours.  Invalid input(s): APPERANCEUR    Imaging: DG Abd 1 View  Result Date: 08/06/2020 CLINICAL DATA:  NG tube placement EXAM: ABDOMEN - 1 VIEW COMPARISON:  07/23/2020 FINDINGS: An enteric tube is present with tip in the left upper quadrant consistent with location in the upper stomach. Postoperative changes in the upper abdomen. Scattered gas and stool in the visualized colon. Linear atelectasis in the lung bases. Calcification of the aorta. IMPRESSION: Enteric tube tip projects over the upper stomach. Electronically Signed   By: Burman Nieves M.D.   On: 08/06/2020 00:57     Medications:   . sodium chloride 10 mL/hr at 07/30/20 0400  . feeding supplement (VITAL AF 1.2 CAL) 1,000 mL (08/06/20 1415)   . docusate  100 mg Per Tube BID  . enoxaparin (LOVENOX) injection  40 mg Subcutaneous Q24H  . feeding supplement (KATE FARMS STANDARD 1.4)  325 mL Oral BID BM  . Ferrous Fumarate  1 tablet Oral BID  . free water  20 mL Per  Tube Q6H  . insulin aspart  0-9 Units Subcutaneous Q4H  . lactulose  20 g Oral BID  . liver oil-zinc oxide   Topical BID  . melatonin  5 mg Oral QHS  . metoprolol tartrate  12.5 mg Per Tube BID  . modafinil  100 mg Oral Daily  . multivitamin  15 mL Per Tube Daily  . pantoprazole sodium  40 mg Per Tube Daily  . rosuvastatin  20 mg Oral Daily  . sodium chloride flush  3 mL Intravenous Q12H  . tamsulosin  0.4 mg Oral QPC supper  . thiamine  100 mg Oral Daily   sodium chloride, acetaminophen **OR** acetaminophen, haloperidol lactate, liver oil-zinc oxide, metoprolol tartrate, ondansetron **OR** ondansetron (ZOFRAN) IV  Assessment/ Plan:  79 y.o. female with medical history of chronic diastolic CHF, hypertension, hyperlipidemia, sleep apnea, gastric bypass April 2021, diabetes  Active Problems:   SIRS (systemic inflammatory response syndrome) (HCC)   Shingles rash   Delirium   Acute metabolic encephalopathy   Essential hypertension   Anemia   Acute lower UTI   Chronic diastolic CHF (congestive heart failure) (HCC)   Sepsis (HCC)   Change in mental status   Anorexia   Malnutrition of moderate degree   #. 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

## 2020-08-06 NOTE — Progress Notes (Signed)
Update given to daughter via phone on patient.

## 2020-08-07 DIAGNOSIS — R509 Fever, unspecified: Secondary | ICD-10-CM | POA: Diagnosis not present

## 2020-08-07 DIAGNOSIS — R519 Headache, unspecified: Secondary | ICD-10-CM | POA: Diagnosis not present

## 2020-08-07 DIAGNOSIS — B029 Zoster without complications: Secondary | ICD-10-CM | POA: Diagnosis not present

## 2020-08-07 DIAGNOSIS — R079 Chest pain, unspecified: Secondary | ICD-10-CM | POA: Diagnosis not present

## 2020-08-07 LAB — GLUCOSE, CAPILLARY
Glucose-Capillary: 135 mg/dL — ABNORMAL HIGH (ref 70–99)
Glucose-Capillary: 149 mg/dL — ABNORMAL HIGH (ref 70–99)
Glucose-Capillary: 165 mg/dL — ABNORMAL HIGH (ref 70–99)
Glucose-Capillary: 175 mg/dL — ABNORMAL HIGH (ref 70–99)
Glucose-Capillary: 177 mg/dL — ABNORMAL HIGH (ref 70–99)
Glucose-Capillary: 180 mg/dL — ABNORMAL HIGH (ref 70–99)

## 2020-08-07 LAB — T3, FREE: T3, Free: 1.7 pg/mL — ABNORMAL LOW (ref 2.0–4.4)

## 2020-08-07 MED ORDER — KATE FARMS STANDARD 1.4 PO LIQD
325.0000 mL | Freq: Every day | ORAL | Status: DC
  Administered 2020-08-08 – 2020-08-14 (×6): 325 mL via ORAL
  Filled 2020-08-07: qty 325

## 2020-08-07 NOTE — Progress Notes (Signed)
Re taped NGT dressing and NGT marked at nare

## 2020-08-07 NOTE — Progress Notes (Signed)
ID Non verbal Obtunded On shaking her she opens eyes and says stop  Patient Vitals for the past 24 hrs:  BP Temp Temp src Pulse Resp SpO2  08/07/20 1546 124/70 97.9 F (36.6 C) -- (!) 101 20 100 %  08/07/20 0735 136/77 98.6 F (37 C) Oral (!) 106 18 97 %  08/06/20 2317 123/75 97.6 F (36.4 C) Oral 70 20 100 %  08/06/20 2048 120/71 -- -- (!) 103 -- --  NG tube Hs s1s2 Chest b/l air entry Abd soft Foley removed CNS cannot be assessed  Labs CBC Latest Ref Rng & Units 08/06/2020 08/05/2020 08/04/2020  WBC 4.0 - 10.5 K/uL 11.2(H) 10.9(H) 9.8  Hemoglobin 12.0 - 15.0 g/dL 10.3(L) 9.9(L) 9.6(L)  Hematocrit 36 - 46 % 28.8(L) 27.6(L) 27.1(L)  Platelets 150 - 400 K/uL 403(H) 389 381    CMP Latest Ref Rng & Units 08/06/2020 08/05/2020 08/04/2020  Glucose 70 - 99 mg/dL 322(G) 254(Y) 706(C)  BUN 8 - 23 mg/dL 37(S) 28(B) 21  Creatinine 0.44 - 1.00 mg/dL 1.51 7.61 6.07  Sodium 135 - 145 mmol/L 127(L) 127(L) 127(L)  Potassium 3.5 - 5.1 mmol/L 4.4 4.4 4.2  Chloride 98 - 111 mmol/L 91(L) 93(L) 93(L)  CO2 22 - 32 mmol/L 27 25 24   Calcium 8.9 - 10.3 mg/dL 9.1 ) 8.9  Total Protein 6.5 - 8.1 g/dL - - -  Total Bilirubin 0.3 - 1.2 mg/dL - - -  Alkaline Phos 38 - 126 U/L - - -  AST 15 - 41 U/L - - -  ALT 0 - 44 U/L - - -   ID  CSF- HSV, enterovirus neg VZV pcrNegative WNV IgG positive in csf, IgM neg Serum WNV IgG and IgM neg Lyme pending Nmdar neg  CSF culture NG so far -72hrs  BC -9/13, 9/6, 9/2 negative  Imaging MRI/MRA 9/14- negative MRI with contrast 9/2 negative   EEG -9/10 , 9/15 and 9/17 - no seizures or epileptiform discharges  Impression/recommendation Admitted on 9/12 with chest pain rt side, fever, headache and leg pain and apparently intermittent confusion as per daughter Found to have herpes zoster Thoracic dermatome  Herpes zoster with aseptic meningitis on admission ( patient had refused LP)- was started on IV acyclovir 10mg /Kg q 8. She was alert and  verbal and appropriate on presentation but after 5-6 days gradual deterioration to obtundation. Seen by neuro- delirium was questioned -EEG on 07/20/20 no seizure activity..There were many reasons including changes in sleep rhythm, poor intake, constipation, not wearing CPAp ( but no co2 narcosis) hyponatremia for the obtundation On 9/14 Had LP which revealed lymphocytic pleocytosis  bacterial culture neg and HSV DNA neg- VZVneg .west Nile virus IgG in csf was positive but not IgM and serum antibodies neg- so this is less likely  acute WNV and IgG is not a reliable test for diagnosis of acute infection as there is cross reactivity with other flavi virus and dengue- There is no treatment for WNV.  MRI and MRA done on 9/14 no acute changes. Daughter wanted her to be in 10/14, but no bed and apparently [facility] has declined [facility] transfer  Repeat EEG done on 07/25/20 no seizure activity noted. 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 VZV PCR neg in csfbut VzV IgG >4000 Completed 21 days of acycolvir  threapy for VZV induced meningo encephalitis on 08/02/20 waxing and waning mental status but in the past 48 hrs she is less responsive than before  - unclear why- ? Hypodelirium? Hyponatremia   Urinary retention -9/12- has foley- removed today for voiding trial and will need void bladder scan  Rt sided abdominal firmness and fullness CT abdomen showsFluid collection at lies along the posterior margin of the pancreas, predominantly collecting along the right anterior pararenal fascia. This may reflect an abscess. It could be a pseudocyst if there is a history of pancreatitis. It may be a postoperative collection, since the patient underwent a cholecystectomy since the prior CT. Collection  measures approximately 14 x 4 x 10 cm in size. repeat CT show the samecollection which is thought to be either a pseudocyst, seroma or biloma and not abscess IR does not recommend aspiration.  Complicated stay at [facility] between 04/01/20 until 05/14/20 for abdominal pain which was diagnosed as choledocholithiasis/cholangitis in a setting of prior RYGB in 2008. 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 and she was taken to [facility] 04/05/20 for diagnostic lap, robotic lysis of adhesions, transgastric ERCP and biliary sphincterotomy, cholecystectomy, partial gastrectomy andaccidentalcolotomy which was repaired.The immediate post op was complicated by post ERCP pancreatitis , pulmonary edema with acute hypoxic resp failure , Afib ( treated with amio and metoprolol), fever and leucocytosis suspected due to pancreatitis and was treated with zosyn, UTI due to proteus and acute on chronic anemia needing PRBC and AKI.She was seen by gerontologist for insomnia and risk for delirium    H/o left TKA  Hearing loss Discussed the management with her nurse.

## 2020-08-07 NOTE — Progress Notes (Signed)
Nutrition Follow-up  DOCUMENTATION CODES:   Non-severe (moderate) malnutrition in context of chronic illness  INTERVENTION:  Continue Vital 1.2 @ 60 ml/hr (1440 ml daily) -Regimen provides 1728 kcal, 108 grams of protein, 1166 ml H20 daily -Free water flush 20 ml Q6hrs  -[employer] 1.4 Vanilla po daily, each supplement provides 455 kcal and 20 grams of protein  -Once po intake improves, consider calorie count to assist in weaning of tube feeds  NUTRITION DIAGNOSIS:   Moderate Malnutrition related to chronic illness (cholecystitis s/p LOA, transgastric ERCP and biliary sphincterotomy, cholecystectomy, partial gastrectomy, and colotomy 5/27, hx Roux-en-Y gastric bypass, CHF) as evidenced by mild fat depletion, mild muscle depletion, moderate muscle depletion, percent weight loss. -ongoing   GOAL:   Patient will meet greater than or equal to 90% of their needs -Meeting with TF  MONITOR:   PO intake, Labs, Weight trends, TF tolerance, I & O's  REASON FOR ASSESSMENT:   Consult Enteral/tube feeding initiation and management  ASSESSMENT:   79 y/o female with h/o CHF, HLD, HTN, OSA, DM, Roux-en-y 2010, Lap band 2008, cholecystitis s/p (diagnostic laparoscopy, robotic lysis of adhesions, transgastric ERCP and biliary sphincterotomy, cholecystectomy, partial gastrectomy and colotomy 5/27) and colon resection for diverticulitis who is admitted with UTI, SIRS, shingles and encephalitis  9/10 per IR no indication for drain placement for stable pancreatic pseudocyst 9/11 NGT placed but plan was to try to adv and wait until AM for repeat x-ray 9/12 TFs started at 20 mL/hr but tube later dislodged; tube was advanced, repeat x-ray taken and TFs resumed at 20 mL/hr 9/12 feeding tube became clogged 9/13 s/p placement of 10 Fr. Dobbhoff tube under fluoroscopic guidance that terminates in proximal portion of gastric remnant (could not be advanced further) 9/14  LP performed, consistent with  viral meningitis/encephalitis  Patient lethargic this afternoon at RD visit, no family at bedside. Noted Continuous tube feeding running at goal. NG tube in over 2 weeks, family with ongoing discussions about PEG placement. Per flowsheets, pt eating 0-100% (18% avg) of meals documented 9/19-9/23 and drinking [employer] <50%, will decrease to once daily. Per notes, family request for [facility] transfer declined on 08/06/20.   Weights have been stable throughout admission   Diet Order:   Diet Order            DIET - DYS 1 Room service appropriate? Yes with Assist; Fluid consistency: Thin  Diet effective now                 EDUCATION NEEDS:   No education needs have been identified at this time  Skin:  Skin Assessment: Reviewed RN Assessment (ecchymosis)  Last BM:  9/26-type 6  Height:   Ht Readings from Last 1 Encounters:  07/12/20 5\' 4"  (1.626 m)    Weight:   Wt Readings from Last 1 Encounters:  08/05/20 78.8 kg    Ideal Body Weight:  54.5 kg  BMI:  Body mass index is 29.82 kg/m.  Estimated Nutritional Needs:   Kcal:  1700-1900kcal/day  Protein:  90-105 grams  Fluid:  1.6L/day   08/07/20, RD, LDN Clinical Nutrition After Hours/Weekend Pager # in Amion

## 2020-08-07 NOTE — Progress Notes (Addendum)
PROGRESS NOTE   HPI was taken from Dr. Adela Glimpse: Casey Baldwin is a 79 y.o. 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  Her temperature at home was up to 102.2 Patient was very weak and had hard time getting up Family recently noted a rash that was going around her right side of back and right breast.  Rash is intensely pruritic different stages of healing some areas of vesicular be red border.  Some areas covered in black eschars.  Skin is diffusely tender.  Today patient developed also confusion fever. Of note in May she had similar admission for confusion and body aches and fever in the time she was having UTI. Patient is fully vaccinated at her baseline she walks with a walker 10 weeks ago patient had gallbladder removed. Ever since her discharge from the hospital from gastric bypass she has been generally weak Patient is a former Engineer, civil (consulting).  She have had 3 visits to emergency department over past 3 days she have had extensive work up including plain chest x-ray, a CTA of the chest, right upper quadrant ultrasound lower extremity ultrasounds Patient was noted to have elevated sed rate which continued to rise White blood cell count remained normal procalcitonin unremarkable Imaging bilateral tibia and fibula also unremarkable Hemoglobin remained stable around 10 Today UA showed evidence of UTI  Infectious risk factors:  Reports  fever, shortness of breath,  severe fatigue    Has  been vaccinated against COVID    Initial COVID TEST   in house  PCR testing  Pending   As per Dr. 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 Rash is intensely pruritic different stages of healing some areas of vesicular be red border. Some areas covered in black eschars. Skin is diffusely tender.Per family (asked by me) pt developed confusion. Was c/o side to back pain.  Sed rate was elevated. LP was considered but patient declined stating that she had a history of meningitis in the past and very scared of having LP done. Hospitalist was called for admission for shingles and possible encephalitis.  9/8.IV acyclovir was changed to Valtrex. Patient has not had a bowel movement since admission, has poor appetite. Giving lactulose, increase the Protonix to twice a day.  9/9.Patient refused CPAP last night, she became more confused today. She also has reversed sleeping cycle. Added Seroquel 25 mg evening. Schedule lactulose twice a day for constipation.  9/10.Patient slept last night with Seroquel. Still very confused and fatigued.Seen by neurologist, suspect metabolic source. Repeated CT scan of the abdomen/pelvis, intra-abdominal fluid collection still present.  9/13.Dobbhoff placed, tube feeding started. Also consult psychiatry for 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  9/14.Patient mental status not improving. Had a fever of 102 yesterday evening. ID restart antibiotics with Rocephin and ampicillin. LP is performed today LP is performed.Study consistent with  viral meningitis/encephalitis. Discussed with infect disease and neurology, neurology concerned for subacute seizure. MRI of the brain and MRA will be performed. EEG is also ordered. However, patient will need  a persistent EEG monitoring, initiate as process to transfer patient to a tertiary hospital. I have started the process with [facility] per family request. They do not want to transfer to [facility]. They are aware that the transfer process may take several days. Also startedseizure medicine per neurology. Discussed with DR. Veal from [facility] neurology, will arrange for transfer.  9/15: EEG: moderate diffuse encephlopahty, nonspecific. No seizure   9/17 more interactive today. EEG done.  9/19- pt more awake and alert, responding more to my questions.   9/21  I called [facility] transfer center, she is still on transfer list, but no beds available and told me they dont know when she will be transferred.  Transfer was initiated last week by Dr. Chipper Herb  Per families request. I updated the status to the daughter.  Hospital course from Dr. Wilfred Lacy 9/22-9/28/21: Pt's mental status has waxed and waned this week. No significant improvement. Pt completed the course of acyclovir. NG tube has been in over 2 weeks now and discussions have been w/ the daughter in regards to a PEG tube but pt's daughter wants to talk to the family before making that decision. Unsure if PEG tube would be an option, considering pt's extensive abd surg hx including gastric bypass which was initially done in 2008. [facility] declined the transfer on 08/06/20.   Celesta Gentile  DJS:[id number] DOB: 22-Apr-1941 DOA: 07/12/2020 PCP: Lesly Rubenstein, MD   Assessment & Plan:   Active Problems:   SIRS (systemic inflammatory response syndrome) (HCC)   Shingles rash   Delirium   Acute metabolic encephalopathy   Essential hypertension   Anemia   Acute lower UTI   Chronic diastolic CHF (congestive heart failure) (HCC)   Sepsis (HCC)   Change in mental status   Anorexia   Malnutrition of moderate degree   Acute metabolic encephalopathy: etiology unclear, meningitis vs delirium. Mental status is labile. Mental status is worse  today. S/p LP on 9/14. MRA/MRI head no acute intracranial abnormalities. Abxs were d/c. EEGs were neg for seizures or epileptiform discharges. CSF neg for HSV, West Nile, autoimmune encephalitis, enterovirus. 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 Completed acyclovir 21 day course. Pt's daughter refused provigil which was initially started by neuro   Shingles: w/ aseptic meningitis. Completed a 21 day course of acyclovir   PSVT: will continue on metoprolol  Poor po intake: unchanged. Continue w/ tube feeds which was initially placed on 07/23/20. Not a permanent solution for poor po intake but unsure if PEG tube would be either, considering pt's extensive abd surg hx including gastric bypass which was initially done in 2008. Pt's daughter is still discussing with family in regards to PEG tube. 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 Na level is labile. Will continue to monitor    Thrombocytosis: likely reactive. Will continue to monitor   Sepsis: resolved  Intra-abdominal fluid collection: w/ CT showing fluid collection likely pseudocyst, seroma or biloma, not abscess. IR did not recommend aspiration   Urinary retention: continue w/ foley. Voiding trial    UTI: completed abx course. Resolved  Chronic diastolic CHF: w/o exacerbation. 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 Pt's daughter requested transfer to [facility] and no beds were available for a couple of weeks. [facility] declined the transfer 08/06/20  Disposition Plan: Likely d/c to SNF but cant go to SNF w/ NG tube and will need a PEG tube if pt's agrees to PEG tube.   Status is: Inpatient  Remains inpatient appropriate  because:Altered mental status, still has NG w/ tube feeds for 2 weeks now. Pt's daughter is still discussing PEG tube placement with family    Dispo: The patient is from: Home              Anticipated d/c is to: SNF              Anticipated d/c date is: > 3 days              Patient currently is not medically stable to d/c.      Consultants:   ID  nephro  neuro    Procedures:    Antimicrobials:    Subjective: Pt is still encephalopathic. Pt is unable to answer questions.  Objective: Vitals:   08/06/20 0759 08/06/20 1641 08/06/20 2048 08/06/20 2317  BP: (!) 157/81 (!) 142/68 120/71 123/75  Pulse: 88 (!) 105 (!) 103 70  Resp: 20 18  20   Temp: 97.9 F (36.6 C) 97.7 F (36.5 C)  97.6 F (36.4 C)  TempSrc: Oral Oral  Oral  SpO2: 97% 97%  100%  Weight:      Height:        Intake/Output Summary (Last 24 hours) at 08/07/2020 0732 Last data filed at 08/07/2020 0500 Gross per 24 hour  Intake --  Output 1650 ml  Net -1650 ml   Filed Weights   08/03/20 0413 08/04/20 0610 08/05/20 0500  Weight: 81.5 kg 76.4 kg 78.8 kg    Examination:  General exam: Appears calm but still confused  Respiratory system: diminished breath sounds b/l. No rales, rhonchi  Cardiovascular system:  S1/S2+. No rubs or gallops Gastrointestinal system: Abd is soft, non-distended, non-tender, & hyperactive bowel sounds  Central nervous system: Awake. Moves all 4 extremities. Unable to follow simple commands  Psychiatry: Judgement and insight appear abnormal. Flat mood and affect      Data Reviewed: I have personally reviewed following labs and imaging studies  CBC: Recent Labs  Lab 08/02/20 0433 08/03/20 0338 08/04/20 0446 08/05/20 0503 08/06/20 0418  WBC 8.1 7.8 9.8 10.9* 11.2*  HGB 9.4* 9.1* 9.6* 9.9* 10.3*  HCT 27.8* 25.8* 27.1* 27.6* 28.8*  MCV 97.2 94.9 94.4 94.8 94.1  PLT 327 326 381 389 403*   Basic Metabolic Panel: Recent Labs  Lab 08/02/20 0433 08/03/20 0338  08/04/20 0446 08/05/20 0503 08/06/20 0418  NA 126* 128* 127* 127* 127*  K 4.5 4.1 4.2 4.4 4.4  CL 91* 96* 93* 93* 91*  CO2 26 24 24 25 27   GLUCOSE 183* 162* 148* 156* 152*  BUN 26* 21 21 25* 26*  CREATININE 0.67 0.56 0.60 0.49 0.57  CALCIUM 9.1 8.6* 8.9 8.8* 9.1   GFR: Estimated Creatinine Clearance: 56.9 mL/min (by C-G formula based on SCr of 0.57 mg/dL). Liver Function Tests: No results for input(s): AST, ALT, ALKPHOS, BILITOT, PROT, ALBUMIN in the last 168 hours. No results for input(s): LIPASE, AMYLASE in the last 168 hours. No results for input(s): AMMONIA in the last 168 hours. Coagulation Profile: No results for  input(s): INR, PROTIME in the last 168 hours. Cardiac Enzymes: No results for input(s): CKTOTAL, CKMB, CKMBINDEX, TROPONINI in the last 168 hours. BNP (last 3 results) No results for input(s): PROBNP in the last 8760 hours. HbA1C: No results for input(s): HGBA1C in the last 72 hours. CBG: Recent Labs  Lab 08/06/20 1130 08/06/20 1642 08/06/20 2032 08/07/20 0029 08/07/20 0349  GLUCAP 160* 182* 151* 135* 175*   Lipid Profile: No results for input(s): CHOL, HDL, LDLCALC, TRIG, CHOLHDL, LDLDIRECT in the last 72 hours. Thyroid Function Tests: Recent Labs    08/05/20 0503  TSH 7.738*  FREET4 0.80  T3FREE 1.7*   Anemia Panel: Recent Labs    08/05/20 0503 08/06/20 0418  VITAMINB12  --  444  FOLATE 17.4  --    Sepsis Labs: No results for input(s): PROCALCITON, LATICACIDVEN in the last 168 hours.  Recent Results (from the past 240 hour(s))  Urine Culture     Status: None   Collection Time: 07/31/20 11:26 PM   Specimen: Urine, Clean Catch  Result Value Ref Range Status   Specimen Description   Final    URINE, CLEAN CATCH Performed at [facility], [address] [place], [place] [address]    Special Requests   Final    NONE Performed at [facility], [address] [place], [place] [address]    Culture   Final    NO  GROWTH Performed at [facility], [address] [place], [place] [address]    Report Status 08/01/2020 FINAL  Final         Radiology Studies: DG Abd 1 View  Result Date: 08/06/2020 CLINICAL DATA:  NG tube placement EXAM: ABDOMEN - 1 VIEW COMPARISON:  07/23/2020 FINDINGS: An enteric tube is present with tip in the left upper quadrant consistent with location in the upper stomach. Postoperative changes in the upper abdomen. Scattered gas and stool in the visualized colon. Linear atelectasis in the lung bases. Calcification of the aorta. IMPRESSION: Enteric tube tip projects over the upper stomach. Electronically Signed   By: Casey NievesWilliam  Stevens M.D.   On: 08/06/2020 00:57        Scheduled Meds: . docusate  100 mg Per Tube BID  . enoxaparin (LOVENOX) injection  40 mg Subcutaneous Q24H  . feeding supplement (KATE FARMS STANDARD 1.4)  325 mL Oral BID BM  . Ferrous Fumarate  1 tablet Oral BID  . free water  20 mL Per Tube Q6H  . insulin aspart  0-9 Units Subcutaneous Q4H  . lactulose  20 g Oral BID  . liver oil-zinc oxide   Topical BID  . melatonin  5 mg Oral QHS  . metoprolol tartrate  12.5 mg Per Tube BID  . modafinil  100 mg Oral Daily  . multivitamin  15 mL Per Tube Daily  . pantoprazole sodium  40 mg Per Tube Daily  . rosuvastatin  20 mg Oral Daily  . sodium chloride flush  3 mL Intravenous Q12H  . tamsulosin  0.4 mg Oral QPC supper  . thiamine  100 mg Oral Daily   Continuous Infusions: . sodium chloride 10 mL/hr at 07/30/20 0400  . feeding supplement (VITAL AF 1.2 CAL) 1,000 mL (08/06/20 1415)     LOS: 25 days    Time spent: 37 mins    Charise KillianJamiese M Venie Montesinos, MD [facility] Pager [phone]  If 7PM-7AM, please contact night-coverage [email or web address] 08/07/2020, 7:32 AM

## 2020-08-07 NOTE — Progress Notes (Signed)
Physical Therapy Treatment Patient Details Name: Casey Baldwin MRN: [id number] DOB: May 07, 1941 Today's Date: 08/07/2020    History of Present Illness Casey Baldwin is a 79 y.o. 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  Presented to ER secondary to generalized body aches, progressive weakness, headaches; admitted for management of SIRS (urine?), shingles and acute metabolic encephalopathy.    PT Comments    Pt did not participate particularly well today with PT session.  On arrival pulling at mitts and showing general confusion and discomfort.  She did not answer any questions asked of her (pain, how she was feeling, etc) and did little more than the occasional mumble apart from screaming out with pain for most R LE exercises.  She was able to inconsistently show some effort on the L but very little AROM/effort with any acts on the R.  Pt was neither appropriate or willing or safe to participate with bed mobility, etc this date.  Pt frankly very limited and showed poor participation with PT session this afternoon.     Follow Up Recommendations  SNF     Equipment Recommendations  Rolling walker with 5" wheels    Recommendations for Other Services       Precautions / Restrictions Precautions Precautions: Fall Restrictions Weight Bearing Restrictions: No    Mobility  Bed Mobility               General bed mobility comments: deferred mobility today, pt screaming out in pain with even the most modest movement in R LE, poor tolerance, poor awareness, poor overall ability to participate.  Transfers                    Ambulation/Gait                 Stairs             Wheelchair Mobility    Modified Rankin (Stroke Patients Only)       Balance       Sitting balance - Comments: did not attempt sitting/standing today secondary to pt confusion, c/o pain and  general inability to show purposeful participation                                    Cognition Arousal/Alertness: Awake/alert Behavior During Therapy: Flat affect;Agitated;Restless   Area of Impairment: Attention;Memory;Following commands;Safety/judgement;Awareness;Problem solving                 Orientation Level: Disoriented to;Place;Time;Situation Current Attention Level: Alternating;Divided   Following Commands: Follows one step commands inconsistently Safety/Judgement: Decreased awareness of safety;Decreased awareness of deficits Awareness: Intellectual Problem Solving: Slow processing;Decreased initiation;Difficulty sequencing;Requires verbal cues;Requires tactile cues General Comments: Pt has poor cognition and inconsistent ability to follow commands.       Exercises General Exercises - Lower Extremity Ankle Circles/Pumps: AAROM;AROM;10 reps (pt with minimal effort on R) Short Arc Quad: AAROM;10 reps;AROM (less assist on L, R with inconsistent screaming out in pain) Heel Slides: AAROM;10 reps;Supine;AROM (Pt with general weakness t/o, severe pain on R) Hip ABduction/ADduction: AROM;AAROM;10 reps;Supine (screaming in pain with all 10 reps on R)    General Comments        Pertinent Vitals/Pain Pain Assessment: Faces Faces Pain Scale: Hurts whole lot Pain Location: R knee/LE (unable to describe pain location, etc, consistent c/o) Pain Descriptors /  Indicators: Grimacing;Discomfort;Moaning    Home Living                      Prior Function            PT Goals (current goals can now be found in the care plan section) Progress towards PT goals: Not progressing toward goals - comment (pain, cognition)    Frequency    Min 2X/week      PT Plan Current plan remains appropriate    Co-evaluation              AM-PAC PT "6 Clicks" Mobility   Outcome Measure  Help needed turning from your back to your side while in a flat bed  without using bedrails?: Total Help needed moving from lying on your back to sitting on the side of a flat bed without using bedrails?: Total Help needed moving to and from a bed to a chair (including a wheelchair)?: Total Help needed standing up from a chair using your arms (e.g., wheelchair or bedside chair)?: Total Help needed to walk in hospital room?: Total Help needed climbing 3-5 steps with a railing? : Total 6 Click Score: 6    End of Session   Activity Tolerance: Patient limited by fatigue;Patient limited by pain (cognition) Patient left: in bed;with call bell/phone within reach;with bed alarm set Nurse Communication: Mobility status;Precautions PT Visit Diagnosis: Muscle weakness (generalized) (M62.81);Difficulty in walking, not elsewhere classified (R26.2);Other abnormalities of gait and mobility (R26.89)     Time: 7408-1448 PT Time Calculation (min) (ACUTE ONLY): 17 min  Charges:  $Therapeutic Exercise: 8-22 mins                     Malachi Pro, DPT 08/07/2020, 4:05 PM

## 2020-08-07 NOTE — Plan of Care (Signed)

## 2020-08-08 DIAGNOSIS — R519 Headache, unspecified: Secondary | ICD-10-CM | POA: Diagnosis not present

## 2020-08-08 DIAGNOSIS — R079 Chest pain, unspecified: Secondary | ICD-10-CM | POA: Diagnosis not present

## 2020-08-08 DIAGNOSIS — R410 Disorientation, unspecified: Secondary | ICD-10-CM | POA: Diagnosis not present

## 2020-08-08 DIAGNOSIS — R509 Fever, unspecified: Secondary | ICD-10-CM | POA: Diagnosis not present

## 2020-08-08 LAB — GLUCOSE, CAPILLARY
Glucose-Capillary: 143 mg/dL — ABNORMAL HIGH (ref 70–99)
Glucose-Capillary: 149 mg/dL — ABNORMAL HIGH (ref 70–99)
Glucose-Capillary: 160 mg/dL — ABNORMAL HIGH (ref 70–99)
Glucose-Capillary: 168 mg/dL — ABNORMAL HIGH (ref 70–99)
Glucose-Capillary: 171 mg/dL — ABNORMAL HIGH (ref 70–99)
Glucose-Capillary: 175 mg/dL — ABNORMAL HIGH (ref 70–99)
Glucose-Capillary: 240 mg/dL — ABNORMAL HIGH (ref 70–99)

## 2020-08-08 MED ORDER — CHLORHEXIDINE GLUCONATE CLOTH 2 % EX PADS
6.0000 | MEDICATED_PAD | Freq: Every day | CUTANEOUS | Status: DC
  Administered 2020-08-08 – 2020-08-23 (×14): 6 via TOPICAL

## 2020-08-08 NOTE — Progress Notes (Signed)
Subjective: Patient remains altered.  Has not returned to baseline.  Daughter is concerned.    Objective: Current vital signs: BP 132/69 (BP Location: Left Arm)    Pulse 100    Temp 98.4 F (36.9 C) (Oral)    Resp 18    Ht  (1.626 m)    Wt 77.2 kg    SpO2 96%    BMI 29.20 kg/m  Vital signs in last 24 hours: Temp:  [97.9 F (36.6 C)-98.4 F (36.9 C)] 98.4 F (36.9 C) (09/28 2344) Pulse Rate:  [100-103] 100 (09/29 0800) Resp:  [18-20] 18 (09/29 0800) BP: (110-132)/(69-94) 132/69 (09/29 0800) SpO2:  [96 %-100 %] 96 % (09/29 0800) Weight:  [77.2 kg] 77.2 kg (09/29 0500)  Intake/Output from previous day: 09/28 0701 - 09/29 0700 In: 545 [NG/GT:545] Out: 1035 [Urine:1035] Intake/Output this shift: No intake/output data recorded. Nutritional status:  Diet Order            DIET - DYS 1 Room service appropriate? Yes with Assist; Fluid consistency: Thin  Diet effective now                 Neurologic Exam: Mental Status: Lethargic.  Opens eyes when name called.  Responds verbally to some questions.  Responses are appropriate.  Follows some simple commands but as the commands become more complex patient does not attempt.   Cranial Nerves: II: Blinks to bilateral confrontation III,IV, VI: Extra-ocular motions intact bilaterally V,VII: face symmetric VIII: hearing normal bilaterally IX,X: unable to test XI: unable to test XII: unable to test Motor: Moves all extremities weakly and to command Sensory: Responds to light noxious stimuli throughout   Lab Results: Basic Metabolic Panel: Recent Labs  Lab 08/02/20 0433 08/02/20 0433 08/03/20 0338 08/03/20 0338 08/04/20 0446 08/05/20 0503 08/06/20 0418  NA 126*  --  128*  --  127* 127* 127*  K 4.5  --  4.1  --  4.2 4.4 4.4  CL 91*  --  96*  --  93* 93* 91*  CO2 26  --  24  --  GLUCOSE 183*  --  162*  --  148* 156* 152*  BUN 26*  --  21  --  21 25* 26*  CREATININE 0.67  --  0.56  --  0.60 0.49 0.57  CALCIUM  9.1   < > 8.6*   < > 8.9 8.8* 9.1   < > = values in this interval not displayed.    Liver Function Tests: No results for input(s): AST, ALT, ALKPHOS, BILITOT, PROT, ALBUMIN in the last 168 hours. No results for input(s): LIPASE, AMYLASE in the last 168 hours. No results for input(s): AMMONIA in the last 168 hours.  CBC: Recent Labs  Lab 08/02/20 0433 08/03/20 0338 08/04/20 0446 08/05/20 0503 08/06/20 0418  WBC 8.1 7.8 9.8 10.9* 11.2*  HGB 9.4* 9.1* 9.6* 9.9* 10.3*  HCT 27.8* 25.8* 27.1* 27.6* 28.8*  MCV 97.2 94.9 94.4 94.8 94.1  PLT 327 326 381 389 403*    Cardiac Enzymes: No results for input(s): CKTOTAL, CKMB, CKMBINDEX, TROPONINI in the last 168 hours.  Lipid Panel: No results for input(s): CHOL, TRIG, HDL, CHOLHDL, VLDL, LDLCALC in the last 168 hours.  CBG: Recent Labs  Lab 08/07/20 1952 08/08/20 0000 08/08/20 0347 08/08/20 0801 08/08/20 1140  GLUCAP 165* 160* 168* 171* 240*    Microbiology: Results for orders placed or performed during the hospital encounter of 07/12/20  Urine culture  Status: Abnormal   Collection Time: 07/12/20  9:23 PM   Specimen: Urine, Random  Result Value Ref Range Status   Specimen Description   Final    URINE, RANDOM Performed at [facility], [address], [place], [place] [address]    Special Requests   Final    NONE Performed at [facility], [address] [place], [place] [address]    Culture MULTIPLE SPECIES PRESENT, SUGGEST RECOLLECTION (A)  Final   Report Status 07/15/2020 FINAL  Final  Culture, blood (routine x 2)     Status: None   Collection Time: 07/12/20  9:23 PM   Specimen: BLOOD  Result Value Ref Range Status   Specimen Description BLOOD RIGHT AC  Final   Special Requests   Final    BOTTLES DRAWN AEROBIC AND ANAEROBIC Blood Culture results may not be optimal due to an excessive volume of blood received in culture bottles   Culture   Final    NO GROWTH 5 DAYS Performed at [facility], [address], [place], [place] [address]    Report Status 07/17/2020 FINAL  Final  Culture, blood (routine x 2)     Status: None   Collection Time: 07/12/20  9:23 PM   Specimen: BLOOD  Result Value Ref Range Status   Specimen Description BLOOD RIGHT WRIST  Final   Special Requests   Final    BOTTLES DRAWN AEROBIC AND ANAEROBIC Blood Culture adequate volume   Culture   Final    NO GROWTH 5 DAYS Performed at [facility], [address], [place], [place] [address]    Report Status 07/17/2020 FINAL  Final  SARS Coronavirus 2 by RT PCR (hospital order, performed in [facility] hospital lab) Nasopharyngeal Nasopharyngeal Swab     Status: None   Collection Time: 07/12/20 11:13 PM   Specimen: Nasopharyngeal Swab  Result Value Ref Range Status   SARS Coronavirus 2 NEGATIVE NEGATIVE Final    Comment: (NOTE) SARS-CoV-2 target nucleic acids are NOT DETECTED.  The SARS-CoV-2 RNA is generally detectable in upper and lower respiratory specimens during the acute phase of infection. The lowest concentration of SARS-CoV-2 viral copies this assay can detect is 250 copies / mL. A negative result does not preclude SARS-CoV-2 infection and should not be used as the sole basis for treatment or other patient management decisions.  A negative result may occur with improper specimen collection / handling, submission of specimen other than nasopharyngeal swab, presence of viral mutation(s) within the areas targeted by this assay, and inadequate number of viral copies (<250 copies / mL). A negative result must be combined with clinical observations, patient history, and epidemiological information.  Fact Sheet for Patients:   BoilerBrush.com.cy  Fact Sheet for Healthcare Providers: https://pope.com/  This test is not yet approved or  cleared by the [place] FDA and has been authorized for detection and/or diagnosis of  SARS-CoV-2 by FDA under an Emergency Use Authorization (EUA).  This EUA will remain in effect (meaning this test can be used) for the duration of the COVID-19 declaration under Section 564(b)(1) of the Act, 21 U.S.C. section 360bbb-3(b)(1), unless the authorization is terminated or revoked sooner.  Performed at [facility], [address] [place], [place] [address]   MRSA PCR Screening     Status: None   Collection Time: 07/13/20  2:02 AM   Specimen: Nasal Mucosa; Nasopharyngeal  Result Value Ref Range Status   MRSA by PCR NEGATIVE  NEGATIVE Final    Comment:        The GeneXpert MRSA Assay (FDA approved for NASAL specimens only), is one component of a comprehensive MRSA colonization surveillance program. It is not intended to diagnose MRSA infection nor to guide or monitor treatment for MRSA infections. Performed at [facility], [address] [place], [place] [address]   CULTURE, BLOOD (ROUTINE X 2) w Reflex to ID Panel     Status: None   Collection Time: 07/16/20  5:36 PM   Specimen: BLOOD  Result Value Ref Range Status   Specimen Description BLOOD [facility]  Final   Special Requests   Final    BOTTLES DRAWN AEROBIC ONLY Blood Culture adequate volume   Culture   Final    NO GROWTH 5 DAYS Performed at [facility], [address] [place], [place] [address]    Report Status 07/21/2020 FINAL  Final  CULTURE, BLOOD (ROUTINE X 2) w Reflex to ID Panel     Status: None   Collection Time: 07/16/20  5:36 PM   Specimen: BLOOD  Result Value Ref Range Status   Specimen Description BLOOD [facility]  Final   Special Requests   Final    BOTTLES DRAWN AEROBIC AND ANAEROBIC Blood Culture adequate volume   Culture   Final    NO GROWTH 5 DAYS Performed at [facility], [address], [place], [place] [address]    Report Status 07/21/2020 FINAL  Final  CULTURE, BLOOD (ROUTINE X 2) w Reflex to ID Panel     Status: None   Collection Time: 07/23/20   7:46 PM   Specimen: BLOOD  Result Value Ref Range Status   Specimen Description BLOOD LEFT [facility]  Final   Special Requests   Final    BOTTLES DRAWN AEROBIC AND ANAEROBIC Blood Culture adequate volume   Culture   Final    NO GROWTH 5 DAYS Performed at [facility], [address] [place], [place] [address]    Report Status 07/28/2020 FINAL  Final  CULTURE, BLOOD (ROUTINE X 2) w Reflex to ID Panel     Status: None   Collection Time: 07/23/20  7:47 PM   Specimen: BLOOD  Result Value Ref Range Status   Specimen Description BLOOD RIGHT [facility]  Final   Special Requests   Final    BOTTLES DRAWN AEROBIC AND ANAEROBIC Blood Culture adequate volume   Culture   Final    NO GROWTH 5 DAYS Performed at [facility], [address], [place], [place] [address]    Report Status 07/28/2020 FINAL  Final  Culture, fungus without smear     Status: None (Preliminary result)   Collection Time: 07/24/20  1:15 PM   Specimen: CSF; Cerebrospinal Fluid  Result Value Ref Range Status   Specimen Description   Final    CSF Performed at [facility], [address] [place], [place] [address]    Special Requests   Final    Normal Performed at [facility], [address], [place], [place] [address]    Culture   Final    NO FUNGUS ISOLATED AFTER 14 DAYS Performed at [facility], [address], [place], [place] [address]    Report Status PENDING  Incomplete  Anaerobic culture     Status: None   Collection Time: 07/24/20  1:15 PM   Specimen: CSF; Cerebrospinal Fluid  Result Value Ref Range Status   Specimen Description  Final    CSF Performed at [facility] [address] [place], [place] [address]    Special Requests   Final    Normal Performed at [facility], [address] [place], [place] [address]    Culture   Final    NO ANAEROBES ISOLATED Performed at [facility], [address], [place], [place] [address]     Report Status 07/29/2020 FINAL  Final  CSF culture     Status: None   Collection Time: 07/24/20  1:15 PM   Specimen: PATH Cytology CSF; Cerebrospinal Fluid  Result Value Ref Range Status   Specimen Description   Final    CSF Performed at [facility], [address], [place], [place] [address]    Special Requests   Final    NONE Performed at [facility], [address], [place], [place] [address]    Gram Stain   Final    WBC SEEN NO ORGANISMS SEEN Performed at [facility], [address], [place], [place] [address]    Culture   Final    NO GROWTH Performed at [facility], [address], [place], [place] [address]    Report Status 07/28/2020 FINAL  Final  Urine Culture     Status: None   Collection Time: 07/31/20 11:26 PM   Specimen: Urine, Clean Catch  Result Value Ref Range Status   Specimen Description   Final    URINE, CLEAN CATCH Performed at [facility], [address], [place], [place] [address]    Special Requests   Final    NONE Performed at [facility], [address], [place], [place] [address]    Culture   Final    NO GROWTH Performed at [facility], [address] [place], [place] [address]    Report Status 08/01/2020 FINAL  Final    Coagulation Studies: No results for input(s): LABPROT, INR in the last 72 hours.  Imaging: No results found.  Medications:  I have reviewed the patient's current medications. Scheduled:  Chlorhexidine Gluconate Cloth  6 each Topical Daily   docusate  100 mg Per Tube BID   enoxaparin (LOVENOX) injection  40 mg Subcutaneous Q24H   feeding supplement (KATE FARMS STANDARD 1.4)  325 mL Oral Daily   Ferrous Fumarate  1 tablet Oral BID   free water  20 mL Per Tube Q6H   insulin aspart  0-9 Units Subcutaneous Q4H   lactulose  20 g Oral BID   liver oil-zinc oxide   Topical BID   melatonin  5 mg Oral QHS   metoprolol tartrate  12.5 mg Per Tube  BID   modafinil  100 mg Oral Daily   multivitamin  15 mL Per Tube Daily   pantoprazole sodium  40 mg Per Tube Daily   rosuvastatin  20 mg Oral Daily   sodium chloride flush  3 mL Intravenous Q12H   tamsulosin  0.4 mg Oral QPC supper   thiamine  100 mg Oral Daily    Assessment/Plan: 79 y.o.femalewith PMH significant for ChronicdiastolicCHF, HTN, HLD, OSA 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 Initially had intact mentation and refused LP. MRI brain with no hyperintensity, no contrast enhancement and therefore thought to be delirium.  This was repeated and again no changes were noted.  She had worsening mentation with daily fevers  despite Acyclovir so we were asked to assess her again.  Initial concern was for subclinical seizure, however with negative rEEG x3 for seizures this was not felt to be the case and the Keppra that was started initially was discontinued.  LP was performed and noted to have CSF lymphoytic pleocytosis and elevated protein, normal glucose.  Patient treated with full course of acyclovir at CNS doses.  Multple CSF studies performed including HSV, autoimmune and further infectious etiologies, all of which returned negative.  Neuro exam began to show consistent improvement but has plateaued.  At this point exam is essentially unchanged from that seen on the 9/24.  Suspect patient with combination of metabolic/infectious encephalopathy and hospital delirium.  Often in the elderly, although the medical issues may be adequately addressed, the patient's cognition may lag behind significantly in improvement.  At this point would optimize patient medically as she continues to participate in therapy.  Due to her prolonged, frequent hospitalizations and GI issues this year would also check a thiamine level.  Recommendations: 1. Agree with continued therapy 2. Serum thiamine  level  Extensive conversation had today with daughter lasting 30 minutes discussing treatment up until this point, diagnosis and further management.  Daughter remains in favor of transfer.      LOS: 26 days   Thana FarrLeslie Bocephus Cali, MD Neurology [phone] 08/08/2020  1:19 PM

## 2020-08-08 NOTE — Progress Notes (Signed)
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 Rash is intensely pruritic different stages of healing some areas of vesicular be red border. Some areas covered in black eschars. Skin is diffusely tender.Per family (asked by me) pt developed confusion. Was c/o side to back pain.  Sed rate was elevated. LP was considered but patient declined stating that she had a history of meningitis in the past and very scared of having LP done. Hospitalist was called for admission for shingles and possible encephalitis.  9/8.IV acyclovir was changed to Valtrex. Patient has not had a bowel movement since admission, has poor appetite. Giving lactulose, increase the Protonix to twice a day.  9/9.Patient refused CPAP last night, she became more confused today. She also has reversed sleeping cycle. Added Seroquel 25 mg evening. Schedule lactulose twice a day for constipation.  9/10.Patient slept last night with Seroquel. Still very confused and fatigued.Seen by neurologist, suspect metabolic source. Repeated CT scan of the abdomen/pelvis, intra-abdominal fluid collection still present.  9/13.Dobbhoff placed, tube feeding started. Also consult psychiatry for 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  9/14.Patient mental status not improving. Had a fever of 102 yesterday evening. ID restart antibiotics with Rocephin and ampicillin. LP is performed today LP is performed.Study consistent with viral  meningitis/encephalitis. Discussed with infect disease and neurology, neurology concerned for subacute seizure. MRI of the brain and MRA will be performed. EEG is also ordered. However, patient will need a persistent EEG monitoring, initiate as process to transfer patient to a tertiary hospital. I have started the process with [facility] per family request. They do not want to transfer to [facility]. They are aware that the transfer process may take several days. Also startedseizure medicine per neurology. Discussed with DR. Veal from [facility] neurology, will arrange for transfer.  9/15: EEG: moderate diffuse encephlopahty, nonspecific. No seizure   9/17 more interactive today. EEG done.  9/19- pt more awake and alert, responding more to my questions.   9/21 I called [facility] transfer center, she is still on transfer list, but no beds available and told me they dont know when she will be transferred. Transfer was initiated last week by Dr. Chipper Herb Per families request. I updated the status to the daughter.  Hospital course from Dr. Wilfred Lacy 9/22-9/28/21: Pt's mental status has waxed and waned this week. No significant improvement. Pt completed the course of acyclovir. NG tube has been in over 2 weeks now and discussions have been w/ the daughter in regards to a PEG tube but pt's daughter wants to talk to the family before making that decision. Unsure if PEG tube would be an option, considering pt's extensive abd surg hx including gastric bypass which was initially done in 2008. [facility] declined the transfer on 08/06/20.   9/29 - daughter at bedside, upset and wants to know why [facility] did not take her mother for transfer. She also wants more explaination of her prognosis from neurologic stand point. Would like to get another neurologist opinion as she does not think pt has delirium/encphalopathy.  Consultants:   ID  nephro  neuro  Procedures:   Antimicrobials:   Iv  acyclovir- completed 21 days    Subjective:  Patient is lethargic.  Opens eyes.  When I try to touch her around her head she yelled "stop". Otherwise does not respond to my questions.  Objective: Vitals:   08/07/20 2006 08/07/20 2344 08/08/20 0500 08/08/20 0800  BP: 120/69 (!) 110/94  132/69  Pulse: (!) 103 (!) 102  100  Resp:  18  18  Temp:  98.4 F (36.9 C)    TempSrc:  Oral    SpO2:  96%  96%  Weight:   77.2 kg   Height:        Intake/Output Summary (Last 24 hours) at 08/08/2020 1504 Last data filed at 08/08/2020 1354 Gross per 24 hour  Intake 485 ml  Output 1035 ml  Net -550 ml   Filed Weights   08/04/20 0610 08/05/20 0500 08/08/20 0500  Weight: 76.4 kg 78.8 kg 77.2 kg    Examination:  General exam: calm, lethargic. In mittens. Opens eyes at times Respiratory system: Clear to auscultation anteriorly. Respiratory effort normal. Cardiovascular system: S1 & S2 heard, RRR. No JVD, murmurs, rubs, gallops or clicks.  Gastrointestinal system: Abdomen is nondistended, soft and nontender.. Normal bowel sounds heard. Central nervous system: unable to examine Extremities: no LE edema, or calf pain. Upper extrem. In mittens b/l Skin: warm, dry Psychiatry: unable to examine    Data Reviewed: I have personally reviewed following labs and imaging studies  CBC: Recent Labs  Lab 08/02/20 0433 08/03/20 0338 08/04/20 0446 08/05/20 0503 08/06/20 0418  WBC 8.1 7.8 9.8 10.9* 11.2*  HGB 9.4* 9.1* 9.6* 9.9* 10.3*  HCT 27.8* 25.8* 27.1* 27.6* 28.8*  MCV 97.2 94.9 94.4 94.8 94.1  PLT 327 326 381 389 403*   Basic Metabolic Panel: Recent Labs  Lab 08/02/20 0433 08/03/20 0338 08/04/20 0446 08/05/20 0503 08/06/20 0418  NA 126* 128* 127* 127* 127*  K 4.5 4.1 4.2 4.4 4.4  CL 91* 96* 93* 93* 91*  CO2 26 24 24 25 27   GLUCOSE 183* 162* 148* 156* 152*  BUN 26* 21 21 25* 26*  CREATININE 0.67 0.56 0.60 0.49 0.57  CALCIUM 9.1 8.6* 8.9 8.8* 9.1   GFR: Estimated Creatinine  Clearance: 56.4 mL/min (by C-G formula based on SCr of 0.57 mg/dL). Liver Function Tests: No results for input(s): AST, ALT, ALKPHOS, BILITOT, PROT, ALBUMIN in the last 168 hours. No results for input(s): LIPASE, AMYLASE in the last 168 hours. No results for input(s): AMMONIA in the last 168 hours. Coagulation Profile: No results for input(s): INR, PROTIME in the last 168 hours. Cardiac Enzymes: No results for input(s): CKTOTAL, CKMB, CKMBINDEX, TROPONINI in the last 168 hours. BNP (last 3 results) No results for input(s): PROBNP in the last 8760 hours. HbA1C: No results for input(s): HGBA1C in the last 72 hours. CBG: Recent Labs  Lab 08/07/20 1952 08/08/20 0000 08/08/20 0347 08/08/20 0801 08/08/20 1140  GLUCAP 165* 160* 168* 171* 240*   Lipid Profile: No results for input(s): CHOL, HDL, LDLCALC, TRIG, CHOLHDL, LDLDIRECT in the last 72 hours. Thyroid Function Tests: No results for input(s): TSH, T4TOTAL, FREET4, T3FREE, THYROIDAB in the last 72 hours. Anemia Panel: Recent Labs    08/06/20 0418  VITAMINB12 444   Sepsis Labs: No results for input(s): PROCALCITON, LATICACIDVEN in the last 168 hours.  Recent Results (from the past 240 hour(s))  Urine Culture     Status:  None   Collection Time: 07/31/20 11:26 PM   Specimen: Urine, Clean Catch  Result Value Ref Range Status   Specimen Description   Final    URINE, CLEAN CATCH Performed at [facility], [address] [place], [place] [address]    Special Requests   Final    NONE Performed at [facility], [address], [place], [place] [address]    Culture   Final    NO GROWTH Performed at [facility], [address], [place], [place] [address]    Report Status 08/01/2020 FINAL  Final         Radiology Studies: No results found.      Scheduled Meds: . Chlorhexidine Gluconate Cloth  6 each Topical Daily  . docusate  100 mg Per Tube BID  . enoxaparin (LOVENOX) injection  40  mg Subcutaneous Q24H  . feeding supplement (KATE FARMS STANDARD 1.4)  325 mL Oral Daily  . Ferrous Fumarate  1 tablet Oral BID  . free water  20 mL Per Tube Q6H  . insulin aspart  0-9 Units Subcutaneous Q4H  . lactulose  20 g Oral BID  . liver oil-zinc oxide   Topical BID  . melatonin  5 mg Oral QHS  . metoprolol tartrate  12.5 mg Per Tube BID  . modafinil  100 mg Oral Daily  . multivitamin  15 mL Per Tube Daily  . pantoprazole sodium  40 mg Per Tube Daily  . rosuvastatin  20 mg Oral Daily  . sodium chloride flush  3 mL Intravenous Q12H  . tamsulosin  0.4 mg Oral QPC supper  . thiamine  100 mg Oral Daily   Continuous Infusions: . sodium chloride 10 mL/hr at 07/30/20 0400  . feeding supplement (VITAL AF 1.2 CAL) 1,000 mL (08/08/20 0056)    Assessment & Plan:   Active Problems:   SIRS (systemic inflammatory response syndrome) (HCC)   Shingles rash   Delirium   Acute metabolic encephalopathy   Essential hypertension   Anemia   Acute lower UTI   Chronic diastolic CHF (congestive heart failure) (HCC)   Sepsis (HCC)   Change in mental status   Anorexia   Malnutrition of moderate degree   Acute metabolic encephalopathy: etiology unclear, meningitis vs delirium. Mental status is labile. Mental status is worse today. S/p LP on 9/14. MRA/MRI head no acute intracranial abnormalities. Abxs were d/c. EEGs were neg for seizures or epileptiform discharges. CSF neg for HSV, West Nile, autoimmune encephalitis, enterovirus. 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 Completed acyclovir 21 day course. Pt's daughter refused provigil which was initially started by neuro  9/29-still lethargic.  Neurologist input was appreciated today.  Suspect patient's altered mental status is combination of metabolic/infectious encephalopathy and hospital delirium.  Often an elderly the patient's cognition may lag behind significantly an improvement even though medical  issues are adequately addressed.  Neurology recommended thiamine level will check today. Continue current medical therapy. Avoid sedatives   Shingles: w/ aseptic meningitis. Completed 21-day course of acyclovir  ID following     PSVT: Continue on metoprolol   Poor po intake: unchanged.  We will continue with tube feeding which initially was started on 07/23/2020.   Still with poor p.o. intake especially with her altered mental status.   Spoke to daughter about possible PEG tube placement but will need to see if she is able to have this done with her extensive abdominal surgical history  including gastric bypass initially done in 2008.  Daughter still wants to think about this.    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   Sodium remains at 127 . Nephrology following, will continue to close monitor    Thrombocytosis: Reactive. Continue to monitor   Sepsis: resolved  Intra-abdominal fluid collection: w/ CT showing fluid collection likely pseudocyst, seroma or biloma, not abscess. IR did not recommend aspiration  Urinary retention:  Foley was discontinued however patient is retaining with bladder scan check.  4 AM had 600 cc had in and out cath.  This morning retaining 350.  Will likely need to replace Foley again.  UTI: completed abx course. Resolved  Chronic diastolic CHF: Without exacerbation.  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  Status is: Inpatient  Remains inpatient appropriate because:Altered mental status   Dispo: The patient is from: Home              Anticipated d/c is to: SNF              Anticipated d/c date is: > 3 days              Patient currently is not medically  stable to d/c.Pt still with altered MS, on NG TF, awaiting for family decide on PEG. Will need PEG inorder to go to SNF.            LOS: 26 days   Time spent: 35 min with >50% on coc    Lynn Ito, MD [facility] Pager [phone]  If 7PM-7AM, please contact night-coverage [email or web address] Password [facility] 08/08/2020, 3:04 PM

## 2020-08-08 NOTE — Progress Notes (Signed)
Occupational Therapy Re-Evaluation Patient Details Name: Casey Baldwin MRN: [id number] DOB: [birth year] Today's Date: 08/08/2020    History of present illness Casey Baldwin is a 79 y.o. 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  Presented to ER secondary to generalized body aches, progressive weakness, headaches; admitted for management of SIRS (urine?), shingles and acute metabolic encephalopathy.   OT comments  Pt seen for re-evaluation of goals this session. Pt supine in bed and is difficulty to arouse for OT intervention. Pt placed pt into chair position and used lemon oral swabs to alert pt further. She did open eyes but remains nonverbal throughout session. OT removed B mittens and pt able to follow commands to reach up "towards the sky" and out making a punching movement x 10 reps with AAROM. OT placing cup into pt's R hand and she was able to bring to mouth with max HHA to take several sips. Pt fatigues very quickly this session. Pt occasionally moans but is not able to tell therapist pain location. Pt repositioned in bed with HOB at 35 degrees and all needs within reach. B hand mitts donned. Pt making slow progress towards occupational therapy goals. Goals downgraded secondary to slow progress. SNF remained appropriate recommendation after discharge.  Follow Up Recommendations  SNF    Equipment Recommendations  Other (comment) (defer to next venue of care)       Precautions / Restrictions Precautions Precautions: Fall       Mobility Bed Mobility  General bed mobility comments: deferred secondary to lethargy         ADL either performed or assessed with clinical judgement   ADL Overall ADL's : Needs assistance/impaired Eating/Feeding: Minimal assistance;Moderate assistance   Grooming: Maximal assistance;Bed level;Wash/dry face Grooming Details (indicate cue type and reason): hand over  hand assist           Vision   Vision Assessment?: No apparent visual deficits          Cognition Arousal/Alertness: Lethargic Behavior During Therapy: Flat affect;Agitated;Restless Overall Cognitive Status: Impaired/Different from baseline Area of Impairment: Attention;Memory;Following commands;Safety/judgement;Awareness;Problem solving     Orientation Level: Disoriented to;Place;Time;Situation Current Attention Level: Alternating;Divided Memory: Decreased recall of precautions;Decreased short-term memory Following Commands: Follows one step commands inconsistently Safety/Judgement: Decreased awareness of safety;Decreased awareness of deficits Awareness: Intellectual Problem Solving: Slow processing;Decreased initiation;Difficulty sequencing;Requires verbal cues;Requires tactile cues General Comments: Pt has poor cognition and inconsistent ability to follow commands.                    Pertinent Vitals/ Pain       Pain Assessment: Faces Faces Pain Scale: Hurts little more Pain Location: generalized - unable to verbalize Pain Descriptors / Indicators: Grimacing;Discomfort;Moaning Pain Intervention(s): Limited activity within patient's tolerance;Monitored during session;Repositioned         Frequency  Min 1X/week        Progress Toward Goals  OT Goals(current goals can now be found in the care plan section)  Progress towards OT goals: Progressing toward goals  Acute Rehab OT Goals Patient Stated Goal: none stated OT Goal Formulation: With family Time For Goal Achievement: 08/22/20 Potential to Achieve Goals: Fair  Plan Frequency remains appropriate;Discharge plan remains appropriate       AM-PAC OT "6 Clicks" Daily Activity     Outcome Measure   Help from another person eating meals?: A Lot Help from another person taking care of personal grooming?: Total  Help from another person toileting, which includes using toliet, bedpan, or urinal?:  Total Help from another person bathing (including washing, rinsing, drying)?: Total Help from another person to put on and taking off regular upper body clothing?: Total Help from another person to put on and taking off regular lower body clothing?: Total 6 Click Score: 7    End of Session    OT Visit Diagnosis: Other abnormalities of gait and mobility (R26.89)   Activity Tolerance Patient limited by pain;Other (comment)   Patient Left in bed;with call bell/phone within reach;with bed alarm set;with SCD's reapplied   Nurse Communication Other (comment) (lethargy)        Time: 0160-1093 OT Time Calculation (min): 23 min  Charges: OT General Charges $OT Visit: 1 Visit OT Treatments $Self Care/Home Management : 23-37 mins  Jackquline Denmark, MS, OTR/L , CBIS ascom [phone]  08/08/20, 4:18 PM

## 2020-08-08 NOTE — Progress Notes (Signed)
PT Cancellation Note  Patient Details Name: Casey Baldwin MRN: [id number] DOB: 27-Dec-1940   Cancelled Treatment:    Reason Eval/Treat Not Completed: Fatigue/lethargy limiting ability to participate.  Pt sleeping in bed upon PT arrival.  With vc's and tactile cues pt would open R eye more than L eye briefly before closing them again and appearing to be sleeping.  Unable to keep pt alert long enough to follow any cueing.  Nurse notified.  Will re-attempt PT session at a later date/time.  Hendricks Limes, PT 08/08/20, 4:10 PM

## 2020-08-08 NOTE — Progress Notes (Signed)
Pt has not voided since 5pm when foley was polled out. Bladders scan 381. Ouma/Np notified. Will cont to monitor pt.

## 2020-08-09 ENCOUNTER — Inpatient Hospital Stay: Payer: Medicare Other

## 2020-08-09 DIAGNOSIS — R079 Chest pain, unspecified: Secondary | ICD-10-CM | POA: Diagnosis not present

## 2020-08-09 DIAGNOSIS — R338 Other retention of urine: Secondary | ICD-10-CM

## 2020-08-09 DIAGNOSIS — B029 Zoster without complications: Secondary | ICD-10-CM | POA: Diagnosis not present

## 2020-08-09 DIAGNOSIS — R509 Fever, unspecified: Secondary | ICD-10-CM | POA: Diagnosis not present

## 2020-08-09 LAB — CBC WITH DIFFERENTIAL/PLATELET
Abs Immature Granulocytes: 0.07 10*3/uL (ref 0.00–0.07)
Basophils Absolute: 0 10*3/uL (ref 0.0–0.1)
Basophils Relative: 0 %
Eosinophils Absolute: 0.1 10*3/uL (ref 0.0–0.5)
Eosinophils Relative: 1 %
HCT: 30.8 % — ABNORMAL LOW (ref 36.0–46.0)
Hemoglobin: 10.6 g/dL — ABNORMAL LOW (ref 12.0–15.0)
Immature Granulocytes: 1 %
Lymphocytes Relative: 7 %
Lymphs Abs: 1 10*3/uL (ref 0.7–4.0)
MCH: 33.2 pg (ref 26.0–34.0)
MCHC: 34.4 g/dL (ref 30.0–36.0)
MCV: 96.6 fL (ref 80.0–100.0)
Monocytes Absolute: 1 10*3/uL (ref 0.1–1.0)
Monocytes Relative: 7 %
Neutro Abs: 12.3 10*3/uL — ABNORMAL HIGH (ref 1.7–7.7)
Neutrophils Relative %: 84 %
Platelets: 413 10*3/uL — ABNORMAL HIGH (ref 150–400)
RBC: 3.19 MIL/uL — ABNORMAL LOW (ref 3.87–5.11)
RDW: 14.8 % (ref 11.5–15.5)
WBC: 14.5 10*3/uL — ABNORMAL HIGH (ref 4.0–10.5)
nRBC: 0 % (ref 0.0–0.2)

## 2020-08-09 LAB — URINALYSIS, ROUTINE W REFLEX MICROSCOPIC
Bilirubin Urine: NEGATIVE
Glucose, UA: NEGATIVE mg/dL
Hgb urine dipstick: NEGATIVE
Ketones, ur: NEGATIVE mg/dL
Nitrite: NEGATIVE
Protein, ur: NEGATIVE mg/dL
Specific Gravity, Urine: 1.027 (ref 1.005–1.030)
Squamous Epithelial / HPF: NONE SEEN (ref 0–5)
pH: 5 (ref 5.0–8.0)

## 2020-08-09 LAB — COMPREHENSIVE METABOLIC PANEL
ALT: 89 U/L — ABNORMAL HIGH (ref 0–44)
AST: 33 U/L (ref 15–41)
Albumin: 3 g/dL — ABNORMAL LOW (ref 3.5–5.0)
Alkaline Phosphatase: 63 U/L (ref 38–126)
Anion gap: 13 (ref 5–15)
BUN: 42 mg/dL — ABNORMAL HIGH (ref 8–23)
CO2: 26 mmol/L (ref 22–32)
Calcium: 9.3 mg/dL (ref 8.9–10.3)
Chloride: 95 mmol/L — ABNORMAL LOW (ref 98–111)
Creatinine, Ser: 0.59 mg/dL (ref 0.44–1.00)
GFR calc Af Amer: >60 mL/min (ref >60)
GFR calc non Af Amer: >60 mL/min (ref >60)
Glucose, Bld: 192 mg/dL — ABNORMAL HIGH (ref 70–99)
Potassium: 3.9 mmol/L (ref 3.5–5.1)
Sodium: 134 mmol/L — ABNORMAL LOW (ref 135–145)
Total Bilirubin: 0.5 mg/dL (ref 0.3–1.2)
Total Protein: 7.3 g/dL (ref 6.5–8.1)

## 2020-08-09 LAB — GLUCOSE, CAPILLARY
Glucose-Capillary: 171 mg/dL — ABNORMAL HIGH (ref 70–99)
Glucose-Capillary: 182 mg/dL — ABNORMAL HIGH (ref 70–99)
Glucose-Capillary: 168 mg/dL — ABNORMAL HIGH (ref 70–99)
Glucose-Capillary: 180 mg/dL — ABNORMAL HIGH (ref 70–99)
Glucose-Capillary: 184 mg/dL — ABNORMAL HIGH (ref 70–99)

## 2020-08-09 LAB — RPR: RPR Ser Ql: NONREACTIVE

## 2020-08-09 NOTE — Progress Notes (Signed)
Patient abdomen is very distended. Very tender to touch. Notified NP. Asked if she could come round on patient. Currently stopped patient tube feeding.

## 2020-08-09 NOTE — Progress Notes (Signed)
ID Pt was somnolent, but on calling her name she opened her eyes and was more interactive today Verbalized full sentences Talked about her children Goes to sleep easily if not kept stimulated Moved both arms spontaneously Low grade fever by axilla  Patient Vitals for the past 24 hrs:  BP Temp Temp src Pulse Resp SpO2 Weight  08/09/20 1618 135/66 98.3 F (36.8 C) Oral 90 18 97 % --  08/09/20 1212 134/64 98 F (36.7 C) Axillary 91 -- -- --  08/09/20 0943 132/68 (!) 100.8 F (38.2 C) Axillary 88 18 96 % --  08/09/20 0747 133/66 97.9 F (36.6 C) Oral 91 18 95 % --  08/09/20 0500 -- -- -- -- -- -- 73.1 kg  08/09/20 0225 (!) 145/75 97.7 F (36.5 C) Oral 90 20 97 % --  08/08/20 2041 131/62 -- -- (!) 103 -- -- --    Chest b/l air entry [id number] Abd soft Foley NG tube  Labs CBC Latest Ref Rng & Units 08/09/2020 08/06/2020 08/05/2020  WBC 4.0 - 10.5 K/uL 14.5(H) 11.2(H) 10.9(H)  Hemoglobin 12.0 - 15.0 g/dL 10.6(L) 10.3(L) 9.9(L)  Hematocrit 36 - 46 % 30.8(L) 28.8(L) 27.6(L)  Platelets 150 - 400 K/uL 413(H) 403(H) 389    CMP Latest Ref Rng & Units 08/09/2020 08/06/2020 08/05/2020  Glucose 70 - 99 mg/dL 295(M) 841(L) 244(W)  BUN 8 - 23 mg/dL 10(U) 72(Z) 36(U)  Creatinine 0.44 - 1.00 mg/dL 4.40 3.47 4.25  Sodium 135 - 145 mmol/L 134(L) 127(L) 127(L)  Potassium 3.5 - 5.1 mmol/L 3.9 4.4 4.4  Chloride 98 - 111 mmol/L 95(L) 91(L) 93(L)  CO2 22 - 32 mmol/L 26 27 25   Calcium 8.9 - 10.3 mg/dL 9.3 9.1 )  Total Protein 6.5 - 8.1 g/dL 7.3 - -  Total Bilirubin 0.3 - 1.2 mg/dL 0.5 - -  Alkaline Phos 38 - 126 U/L 63 - -  AST 15 - 41 U/L 33 - -  ALT 0 - 44 U/L 89(H) - -    Impression/recommendation Low grade fever but clinically no evidnec Risk for aspiration, atelectasis, uti UA no nitrite, rare bacteria -no evidence of UTI CXR rt- lower lobe atelectasis No antibiotics needed now  Hypoactive delirium -more interactive today and Na has also improved to 134 She needs constant  stimulation and activity Need PT/OT/speech, OOB, breathing exercise  Admitted on 9/12 with chest pain rt side, fever, headache and leg pain and intermittent confusion  Found to have herpes zoster Thoracic dermatome  Herpes zoster with aseptic meningitis on admission ( patient had refused LP initially )- was started on IV acyclovir 10mg /Kg q 8. She was alert and verbal and appropriate on presentation but after 5-6 days gradual deterioration to obtundation. Seen by neuro- delirium was questioned -EEG on 07/20/20 no seizure activity..There were many reasons including changes in sleep rhythm, poor intake, constipation, not wearing CPAp ( but no co2 narcosis) hyponatremia for the obtundation On 9/14 Had LP which revealed lymphocytic pleocytosis bacterial culture neg and HSV DNA neg- VZVneg .west Nile virus IgG in csf was positive but not IgM and serum antibodies neg- so this isless likelyacute WNV and IgG is not a reliable test for diagnosis of acute infection as there is cross reactivity with other flavi virus and dengue-There is no treatment for WNV.  MRI and MRA done on 9/14 no acute changes. Daughter wanted her to be in 10/14, but no bed andapparently [facility] transfer Repeat EEG done on 07/25/20 no seizure  activity noted. 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 VZV PCR neg in csfbut VzV IgG >4000 Completed 21 days of acycolvir threapy for VZV induced meningo encephalitison 08/02/20 waxing and waning mentalstatus but in the past 48 hrs she is less responsive than before-unclear why- ? Hypodelirium? Hyponatremia  Urinary retention -9/12- has foley-failed voiding trial on 9/28 and reinserted 9/29   Rt sided abdominal firmness and fullness CT  abdomen showsFluid collection pseudocyst  Complicated stay at [facility] between 04/01/20 until 05/14/20 for abdominal pain which was diagnosed as choledocholithiasis/cholangitis in a setting of prior RYGB in 2008. 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 and she was taken to [facility] 04/05/20 for diagnostic lap, robotic lysis of adhesions, transgastric ERCP and biliary sphincterotomy, cholecystectomy, partial gastrectomy andaccidentalcolotomy which was repaired.The immediate post op was complicated by post ERCP pancreatitis , pulmonary edema with acute hypoxic resp failure , Afib ( treated with amio and metoprolol), fever and leucocytosis suspected due to pancreatitis and was treated with zosyn, UTI due to proteus and acute on chronic anemia needing PRBC and AKI.She was seen by gerontologist for insomnia and risk for delirium    H/o left TKA  Hearing loss Discussed the management with her nurse and hospitalist

## 2020-08-09 NOTE — Progress Notes (Signed)
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 Rash is intensely pruritic different stages of healing some areas of vesicular be red border. Some areas covered in black eschars. Skin is diffusely tender.Per family (asked by me) pt developed confusion. Was c/o side to back pain.  Sed rate was elevated. LP was considered but patient declined stating that she had a history of meningitis in the past and very scared of having LP done. Hospitalist was called for admission for shingles and possible encephalitis.  9/8.IV acyclovir was changed to Valtrex. Patient has not had a bowel movement since admission, has poor appetite. Giving lactulose, increase the Protonix to twice a day.  9/9.Patient refused CPAP last night, she became more confused today. She also has reversed sleeping cycle. Added Seroquel 25 mg evening. Schedule lactulose twice a day for constipation.  9/10.Patient slept last night with Seroquel. Still very confused and fatigued.Seen by neurologist, suspect metabolic source. Repeated CT scan of the abdomen/pelvis, intra-abdominal fluid collection still present.  9/13.Dobbhoff placed, tube feeding started. Also consult psychiatry for 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  9/14.Patient mental status not improving. Had a fever of 102 yesterday evening. ID restart antibiotics with Rocephin and ampicillin. LP is performed today LP is performed.Study consistent with viral  meningitis/encephalitis. Discussed with infect disease and neurology, neurology concerned for subacute seizure. MRI of the brain and MRA will be performed. EEG is also ordered. However, patient will need a persistent EEG monitoring, initiate as process to transfer patient to a tertiary hospital. I have started the process with [facility] per family request. They do not want to transfer to [facility]. They are aware that the transfer process may take several days. Also startedseizure medicine per neurology. Discussed with DR. Veal from [facility] neurology, will arrange for transfer.  9/15: EEG: moderate diffuse encephlopahty, nonspecific. No seizure   9/17 more interactive today. EEG done.  9/19- pt more awake and alert, responding more to my questions.   9/21 I called [facility] transfer center, she is still on transfer list, but no beds available and told me they dont know when she will be transferred. Transfer was initiated last week by Dr. Chipper Herb Per families request. I updated the status to the daughter.  Hospital course from Dr. Wilfred Lacy 9/22-9/28/21: Pt's mental status has waxed and waned this week. No significant improvement. Pt completed the course of acyclovir. NG tube has been in over 2 weeks now and discussions have been w/ the daughter in regards to a PEG tube but pt's daughter wants to talk to the family before making that decision. Unsure if PEG tube would be an option, considering pt's extensive abd surg hx including gastric bypass which was initially done in 2008. [facility] declined the transfer on 08/06/20.   9/29 - daughter at bedside, upset and wants to know why [facility] did not take her mother for transfer. She also wants more explaination of her prognosis from neurologic stand point. Would like to get another neurologist opinion as she does not think pt has delirium/encphalopathy.  9/30-pt opens her eyes, more interactive. Tries to answer my questions , smiling and  waving hello to me.  Consultants:   ID  nephro  neuro  Procedures:   Antimicrobials:   Iv acyclovir- completed 21 days    Subjective: Per nsg pt has been awake and more interactive since last night.  Denies any shortness of breath or pain.  Per nursing she took some applesauce with her pills.  Per nursing positive BM   Objective: Vitals:   08/08/20 2041 08/09/20 0225 08/09/20 0500 08/09/20 0747  BP: 131/62 (!) 145/75  133/66  Pulse: (!) 103 90  91  Resp:  20  18  Temp:  97.7 F (36.5 C)  97.9 F (36.6 C)  TempSrc:  Oral  Oral  SpO2:  97%  95%  Weight:   73.1 kg   Height:        Intake/Output Summary (Last 24 hours) at 08/09/2020 0815 Last data filed at 08/08/2020 1527 Gross per 24 hour  Intake 360 ml  Output --  Net 360 ml   Filed Weights   08/05/20 0500 08/08/20 0500 08/09/20 0500  Weight: 78.8 kg 77.2 kg 73.1 kg    Examination: Smiling, more interactive today, eyes open.  Waving hello. HEENT: EOMI.  To feeding via nose CTA no rales wheeze rhonchi RRR S1-S2 no murmurs Soft, positive bowel sounds, nontender, nondistended GU-Foley in place with yellow urine No edema bilaterally Awake but unable to do full neuro exam     Data Reviewed: I have personally reviewed following labs and imaging studies  CBC: Recent Labs  Lab 08/03/20 0338 08/04/20 0446 08/05/20 0503 08/06/20 0418  WBC 7.8 9.8 10.9* 11.2*  HGB 9.1* 9.6* 9.9* 10.3*  HCT 25.8* 27.1* 27.6* 28.8*  MCV 94.9 94.4 94.8 94.1  PLT 326 381 389 403*   Basic Metabolic Panel: Recent Labs  Lab 08/03/20 0338 08/04/20 0446 08/05/20 0503 08/06/20 0418  NA 128* 127* 127* 127*  K 4.1 4.2 4.4 4.4  CL 96* 93* 93* 91*  CO2 24 24 25 27   GLUCOSE 162* 148* 156* 152*  BUN 21 21 25* 26*  CREATININE 0.56 0.60 0.49 0.57  CALCIUM 8.6* 8.9 8.8* 9.1   GFR: Estimated Creatinine Clearance: 55 mL/min (by C-G formula based on SCr of 0.57 mg/dL). Liver Function Tests: No results for input(s): AST, ALT,  ALKPHOS, BILITOT, PROT, ALBUMIN in the last 168 hours. No results for input(s): LIPASE, AMYLASE in the last 168 hours. No results for input(s): AMMONIA in the last 168 hours. Coagulation Profile: No results for input(s): INR, PROTIME in the last 168 hours. Cardiac Enzymes: No results for input(s): CKTOTAL, CKMB, CKMBINDEX, TROPONINI in the last 168 hours. BNP (last 3 results) No results for input(s): PROBNP in the last 8760 hours. HbA1C: No results for input(s): HGBA1C in the last 72 hours. CBG: Recent Labs  Lab 08/08/20 1140 08/08/20 1547 08/08/20 2034 08/08/20 2331 08/09/20 0327  GLUCAP 240* 143* 149* 175* 171*   Lipid Profile: No results for input(s): CHOL, HDL, LDLCALC, TRIG, CHOLHDL, LDLDIRECT in the last 72 hours. Thyroid Function Tests: No results for input(s): TSH, T4TOTAL, FREET4, T3FREE, THYROIDAB in the last 72 hours. Anemia Panel: No results for input(s): VITAMINB12, FOLATE, FERRITIN, TIBC, IRON, RETICCTPCT in the last 72 hours. Sepsis Labs: No results for input(s): PROCALCITON, LATICACIDVEN in the last 168 hours.  Recent Results (from the past 240 hour(s))  Urine Culture     Status: None   Collection Time: 07/31/20 11:26 PM  Specimen: Urine, Clean Catch  Result Value Ref Range Status   Specimen Description   Final    URINE, CLEAN CATCH Performed at [facility], [address] [place], [place] [address]    Special Requests   Final    NONE Performed at [facility], [address], [place], [place] [address]    Culture   Final    NO GROWTH Performed at [facility], [address], [place], [place] [address]    Report Status 08/01/2020 FINAL  Final         Radiology Studies: No results found.      Scheduled Meds:  Chlorhexidine Gluconate Cloth  6 each Topical Daily   docusate  100 mg Per Tube BID   enoxaparin (LOVENOX) injection  40 mg Subcutaneous Q24H   feeding supplement (KATE FARMS STANDARD 1.4)  325 mL  Oral Daily   Ferrous Fumarate  1 tablet Oral BID   free water  20 mL Per Tube Q6H   insulin aspart  0-9 Units Subcutaneous Q4H   lactulose  20 g Oral BID   liver oil-zinc oxide   Topical BID   melatonin  5 mg Oral QHS   metoprolol tartrate  12.5 mg Per Tube BID   modafinil  100 mg Oral Daily   multivitamin  15 mL Per Tube Daily   pantoprazole sodium  40 mg Per Tube Daily   rosuvastatin  20 mg Oral Daily   sodium chloride flush  3 mL Intravenous Q12H   tamsulosin  0.4 mg Oral QPC supper   thiamine  100 mg Oral Daily   Continuous Infusions:  sodium chloride 10 mL/hr at 07/30/20 0400   feeding supplement (VITAL AF 1.2 CAL) 1,000 mL (08/08/20 2040)    Assessment & Plan:   Active Problems:   SIRS (systemic inflammatory response syndrome) (HCC)   Shingles rash   Delirium   Acute metabolic encephalopathy   Essential hypertension   Anemia   Acute lower UTI   Chronic diastolic CHF (congestive heart failure) (HCC)   Sepsis (HCC)   Change in mental status   Anorexia   Malnutrition of moderate degree   Acute metabolic encephalopathy: etiology unclear, meningitis vs delirium. Mental status is labile. Mental status is worse today. S/p LP on 9/14. MRA/MRI head no acute intracranial abnormalities. Abxs were d/c. EEGs were neg for seizures or epileptiform discharges. CSF neg for HSV, West Nile, autoimmune encephalitis, enterovirus. 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  Completed acyclovir 21 day course. Pt's daughter refused provigil which was initially started by neuro  9/30-more interactive today and more awake this am.  Per neurology patient's altered mental status is combination of metabolic/infectious encephalopathy/hyponatremia and hospital delirium.  As often elderly patient's cognition may lag behind significant improvement even though medical issues are adequately addressed  -We will continue to avoid sedate  if's Thiamine levels ordered pending  Shingles: w/ aseptic meningitis. Completed 21-day course of acyclovir per ID recommendation    PSVT: No further episodes on telemetry, continue metoprolol   Poor po intake: unchanged.  We will continue with tube feeding which initially was started on 07/23/2020.   P.o. intake still poor especially with her altered mental status.   Will need PEG tube placement especially going to SNF at some point as they will not take her with NG tube feeding.  I spoke to daughter yesterday awaiting to see if they want to continue with this  plan.  Also not sure if PEG tube able to be placed with her extensive abdominal surgery history including gastric bypass.   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   9/30-Has improved with sodium level of 134 Nephrology was initially consulted   Thrombocytosis: Reactive. Continue to monitor   Sepsis: resolved  Intra-abdominal fluid collection: w/ CT showing fluid collection likely pseudocyst, seroma or biloma, not abscess. IR did not recommend aspiration I  Urinary retention:  Foley was discontinued initially however patient was retaining urine and bladder scan revealed more than 300 each time at 1 time over 600.  Foley was replaced again.   We will try to do more voiding trial in the near future once she her mental status continues to improve.    UTI: completed abx course. Resolved  Chronic diastolic CHF: Without exacerbation.  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 Will need PEG inorder to go to SNF.            LOS: 27 days   Time spent: 35 min with >50% on coc    Lynn Ito, MD [facility] Pager [phone]  If 7PM-7AM, please contact night-coverage [email or web address] Password [email or web address] 08/09/2020, 8:15 AM

## 2020-08-09 NOTE — Progress Notes (Signed)
Physical Therapy Treatment Patient Details Name: Casey Baldwin MRN: [id number] DOB: 11/10/1941 Today's Date: 08/09/2020    History of Present Illness Casey Baldwin is a 79 y.o. 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  Presented to ER secondary to generalized body aches, progressive weakness, headaches; admitted for management of SIRS (urine?), shingles and acute metabolic encephalopathy.    PT Comments    Pt resting in bed upon PT arrival; woken with vc's.  Pt initially did not verbalize anything to therapist but after therapist verbalized to pt plan for LE ROM activities and therapist started to move R LE pt yelling out in pain.  Pt did not verbalize where the pain was but when therapist started performing slow very minimal ROM to R LE pt appearing comfortable and therapist able to increased R LE ROM (with PROM R LE heel-slides).  When therapist attempted to perform L LE ROM pt immediately yelling out in pain.  Therapist attempted to find source of pain but unable (and pt did not verbalize where when asked).  Therapist attempted to perform slow very minimal ROM to L LE (as done on R LE) but pt yelling out in pain and started yelling at therapist to stop (pt talking in partial to full sentences).  Deferred further activity d/t pt yelling at therapist to stop.  Pt appearing comfortable in bed end of session.  Will continue to attempt ROM, ex's, and progressive functional mobility per pt tolerance.   Follow Up Recommendations  SNF     Equipment Recommendations  Rolling walker with 5" wheels    Recommendations for Other Services OT consult     Precautions / Restrictions Precautions Precautions: Fall Restrictions Other Position/Activity Restrictions: NG tube; HOB >30 degrees; aspiration; prevalon boots    Mobility  Bed Mobility                  Transfers                     Ambulation/Gait                 Stairs             Wheelchair Mobility    Modified Rankin (Stroke Patients Only)       Balance                                            Cognition Arousal/Alertness:  (pt sleeping initially but woken with vc's) Behavior During Therapy: Flat affect Overall Cognitive Status: No family/caregiver present to determine baseline cognitive functioning (pt did not follow any commands and did not answer any A&O questions)                                        Exercises General Exercises - Lower Extremity Heel Slides: PROM;Right;10 reps;Supine (pt yelling out in pain with any attempt to move L knee so L LE deferred) Other Exercises Other Exercises: 3x30 seconds B heelcord stretching    General Comments        Pertinent Vitals/Pain Pain Assessment: Faces Faces Pain Scale: No hurt Pain Location: pt unable to verbalize site of pain B LE's Pain Descriptors / Indicators: Grimacing;Other (Comment) (vocalizing)  Pain Intervention(s): Limited activity within patient's tolerance;Repositioned    Home Living                      Prior Function            PT Goals (current goals can now be found in the care plan section) Acute Rehab PT Goals Patient Stated Goal: none stated PT Goal Formulation: With patient Time For Goal Achievement: 08/13/20 Potential to Achieve Goals: Fair Progress towards PT goals: Not progressing toward goals - comment (pain; cognition)    Frequency    Min 2X/week      PT Plan Current plan remains appropriate    Co-evaluation              AM-PAC PT "6 Clicks" Mobility   Outcome Measure  Help needed turning from your back to your side while in a flat bed without using bedrails?: Total Help needed moving from lying on your back to sitting on the side of a flat bed without using bedrails?: Total Help needed moving to and from a bed to a chair (including  a wheelchair)?: Total Help needed standing up from a chair using your arms (e.g., wheelchair or bedside chair)?: Total Help needed to walk in hospital room?: Total Help needed climbing 3-5 steps with a railing? : Total 6 Click Score: 6    End of Session Equipment Utilized During Treatment: Oxygen Activity Tolerance: Patient limited by pain Patient left: in bed;with call bell/phone within reach;with bed alarm set;Other (comment) (B prevalon boots in place) Nurse Communication: Precautions PT Visit Diagnosis: Muscle weakness (generalized) (M62.81);Difficulty in walking, not elsewhere classified (R26.2);Other abnormalities of gait and mobility (R26.89)     Time: 0160-1093 PT Time Calculation (min) (ACUTE ONLY): 16 min  Charges:  $Therapeutic Exercise: 8-22 mins                    Hendricks Limes, PT 08/09/20, 2:40 PM

## 2020-08-09 NOTE — Progress Notes (Signed)
NP Jon Billings came and saw patient. Was advised not to use the TF.

## 2020-08-09 NOTE — Plan of Care (Signed)

## 2020-08-09 NOTE — Progress Notes (Signed)
Patient back from  X-ray 

## 2020-08-09 NOTE — Progress Notes (Signed)
Patient just got taken down by transport to get DG of Abd.

## 2020-08-09 NOTE — Progress Notes (Signed)
ID Late entry Seen patient on 08/08/20 at 7pm- Daughter Casey Baldwin in the room Pt was lying in bed with eyes opened On persistant  talking and stimulation she talked- she recognized some items like comb, flower and verbalized- She took a few spoonful of apple sauce and drank water- was able to swallow. She spoke some sentences appropriately. For certain questions she was straining hard but couldn't get the words  O/E  Vitas temp 98.9, BP 145/75, HR 90 Hypoactive Minimally verbal Able to lift her arms slowly on command Did not move her limbs actively- On passive movement she initally was screaming and then became fine especially with the rt leg Chest b/l Air entry Hss1s2 abd soft foely NG tube  Labs CBC Latest Ref Rng & Units 08/06/2020 08/05/2020 08/04/2020  WBC 4.0 - 10.5 K/uL 11.2(H) 10.9(H) 9.8  Hemoglobin 12.0 - 15.0 g/dL 10.3(L) 9.9(L) 9.6(L)  Hematocrit 36 - 46 % 28.8(L) 27.6(L) 27.1(L)  Platelets 150 - 400 K/uL 403(H) 389 381    CMP Latest Ref Rng & Units 08/06/2020 08/05/2020 08/04/2020  Glucose 70 - 99 mg/dL 782(N) 562(Z) 308(M)  BUN 8 - 23 mg/dL 57(Q) 46(N) 21  Creatinine 0.44 - 1.00 mg/dL 6.29 5.28 4.13  Sodium 135 - 145 mmol/L 127(L) 127(L) 127(L)  Potassium 3.5 - 5.1 mmol/L 4.4 4.4 4.2  Chloride 98 - 111 mmol/L 91(L) 93(L) 93(L)  CO2 22 - 32 mmol/L 27 25 24   Calcium 8.9 - 10.3 mg/dL 9.1 ) 8.9  Total Protein 6.5 - 8.1 g/dL - - -  Total Bilirubin 0.3 - 1.2 mg/dL - - -  Alkaline Phos 38 - 126 U/L - - -  AST 15 - 41 U/L - - -  ALT 0 - 44 U/L - - -          Impression/recommendation Hypoactive delirium- correct hyponatremia Watch for infections as she is risk for aspiration and UTI  She needs stimulation, PT, orientation She would benefit from intense geriatric care with rehab Also nutrtion is becoming an issue- She has NG tube but cannot be in place for long and needs other modalitis like PEG but hs ehas a complicated Abdominal sugical history with underlying  Gastric bypass- will benefit transfer to [facility] to address this issue  .Admitted on 9/12 with chest pain rt side, fever, headache and leg pain and intermittent confusion  Found to have herpes zoster Thoracic dermatome  Herpes zoster with aseptic meningitis on admission ( patient had refused LP initially )- was started on IV acyclovir 10mg /Kg q 8. She was alert and verbal and appropriate on presentation but after 5-6 days gradual deterioration to obtundation. Seen by neuro- delirium was questioned -EEG on 07/20/20 no seizure activity..There were many reasons including changes in sleep rhythm, poor intake, constipation, not wearing CPAp ( but no co2 narcosis) hyponatremia for the obtundation On 9/14 Had LP which revealed lymphocytic pleocytosis  bacterial culture neg and HSV DNA neg- VZVneg .west Nile virus IgG in csf was positive but not IgM and serum antibodies neg- so this is less likely  acute WNV and IgG is not a reliable test for diagnosis of acute infection as there is cross reactivity with other flavi virus and dengue- There is no treatment for WNV.  MRI and MRA done on 9/14 no acute changes. Daughter wanted her to be in 10/14, but no bed and apparently [facility] has declined [facility] transfer  Repeat EEG done on 07/25/20 no seizure activity noted. 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 VZV PCR neg in csfbut VzV IgG >4000 Completed 21 days of acycolvir threapy for VZV induced meningo encephalitison 08/02/20 waxing and waning mental status but in the past 48 hrs she is less responsive than before  - unclear why- ? Hypodelirium? Hyponatremia   Urinary retention -9/12- has foley- failed voiding trial on 9/28 and reinserted 9/29   Rt sided abdominal firmness and fullness CT abdomen  showsFluid collection pseudocyst  Complicated stay at [facility] between 04/01/20 until 05/14/20 for abdominal pain which was diagnosed as choledocholithiasis/cholangitis in a setting of prior RYGB in 2008. 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 and she was taken to [facility] 04/05/20 for diagnostic lap, robotic lysis of adhesions, transgastric ERCP and biliary sphincterotomy, cholecystectomy, partial gastrectomy andaccidentalcolotomy which was repaired.The immediate post op was complicated by post ERCP pancreatitis , pulmonary edema with acute hypoxic resp failure , Afib ( treated with amio and metoprolol), fever and leucocytosis suspected due to pancreatitis and was treated with zosyn, UTI due to proteus and acute on chronic anemia needing PRBC and AKI.She was seen by gerontologist for insomnia and risk for delirium    H/o left TKA  Hearing loss Discussed the management with her daughter in great detail and with her nurse.

## 2020-08-10 ENCOUNTER — Inpatient Hospital Stay: Payer: Medicare Other

## 2020-08-10 DIAGNOSIS — R410 Disorientation, unspecified: Secondary | ICD-10-CM | POA: Diagnosis not present

## 2020-08-10 DIAGNOSIS — B029 Zoster without complications: Secondary | ICD-10-CM | POA: Diagnosis not present

## 2020-08-10 DIAGNOSIS — R1084 Generalized abdominal pain: Secondary | ICD-10-CM

## 2020-08-10 DIAGNOSIS — R509 Fever, unspecified: Secondary | ICD-10-CM | POA: Diagnosis not present

## 2020-08-10 LAB — GLUCOSE, CAPILLARY
Glucose-Capillary: 127 mg/dL — ABNORMAL HIGH (ref 70–99)
Glucose-Capillary: 144 mg/dL — ABNORMAL HIGH (ref 70–99)
Glucose-Capillary: 161 mg/dL — ABNORMAL HIGH (ref 70–99)
Glucose-Capillary: 123 mg/dL — ABNORMAL HIGH (ref 70–99)
Glucose-Capillary: 132 mg/dL — ABNORMAL HIGH (ref 70–99)
Glucose-Capillary: 150 mg/dL — ABNORMAL HIGH (ref 70–99)
Glucose-Capillary: 135 mg/dL — ABNORMAL HIGH (ref 70–99)

## 2020-08-10 LAB — CBC
HCT: 28.6 % — ABNORMAL LOW (ref 36.0–46.0)
Hemoglobin: 9.4 g/dL — ABNORMAL LOW (ref 12.0–15.0)
MCH: 32.8 pg (ref 26.0–34.0)
MCHC: 32.9 g/dL (ref 30.0–36.0)
MCV: 99.7 fL (ref 80.0–100.0)
Platelets: 346 10*3/uL (ref 150–400)
RBC: 2.87 MIL/uL — ABNORMAL LOW (ref 3.87–5.11)
RDW: 14.7 % (ref 11.5–15.5)
WBC: 12.8 10*3/uL — ABNORMAL HIGH (ref 4.0–10.5)
nRBC: 0 % (ref 0.0–0.2)

## 2020-08-10 LAB — SODIUM: Sodium: 132 mmol/L — ABNORMAL LOW (ref 135–145)

## 2020-08-10 MED ORDER — SODIUM CHLORIDE 1 G PO TABS
1.0000 g | ORAL_TABLET | Freq: Two times a day (BID) | ORAL | Status: DC
  Administered 2020-08-11 – 2020-08-24 (×21): 1 g via ORAL
  Filled 2020-08-10 (×30): qty 1

## 2020-08-10 NOTE — Progress Notes (Signed)
Scheduled meds given, pt watching tv

## 2020-08-10 NOTE — Progress Notes (Signed)
Pt laying in bed. Eyes open. Call bell within reach, bed alarm on

## 2020-08-10 NOTE — Progress Notes (Addendum)
Pt is more alert Says she is feeling better today. Says she was not well last night Had gaseous abdomen  follows commands and responds appropriately most of the time  Patient Vitals for the past 24 hrs:  BP Temp Temp src Pulse Resp SpO2 Weight  08/10/20 0740 (!) 141/58 98.2 F (36.8 C) Oral 92 18 95 % --  08/10/20 0451 -- -- -- -- -- -- 79.9 kg  08/10/20 0409 (!) 143/67 (!) 96.5 F (35.8 C) Axillary 89 16 96 % --  08/09/20 2324 131/62 99 F (37.2 C) Oral 96 16 97 % --  08/09/20 2151 137/70 99.6 F (37.6 C) Axillary 98 16 97 % --  08/09/20 1618 135/66 98.3 F (36.8 C) Oral 90 18 97 % --   O/e More alert Chest b/l air entry HSs1s2 abd soft, distension, no pain Ng tube Foley Moves upper limbs very well  Lower extremities only passive movt   Labs CBC Latest Ref Rng & Units 08/10/2020 08/09/2020 08/06/2020  WBC 4.0 - 10.5 K/uL 12.8(H) 14.5(H) 11.2(H)  Hemoglobin 12.0 - 15.0 g/dL 4.7(W) 10.6(L) 10.3(L)  Hematocrit 36 - 46 % 28.6(L) 30.8(L) 28.8(L)  Platelets 150 - 400 K/uL 346 413(H) 403(H)    CMP Latest Ref Rng & Units 08/10/2020 08/09/2020 08/06/2020  Glucose 70 - 99 mg/dL - 295(A) 213(Y)  BUN 8 - 23 mg/dL - 86(V) 78(I)  Creatinine 0.44 - 1.00 mg/dL - 6.96 2.95  Sodium 284 - 145 mmol/L 132(L) 134(L) 127(L)  Potassium 3.5 - 5.1 mmol/L - 3.9 4.4  Chloride 98 - 111 mmol/L - 95(L) 91(L)  CO2 22 - 32 mmol/L - 26 27  Calcium 8.9 - 10.3 mg/dL - 9.3 9.1  Total Protein 6.5 - 8.1 g/dL - 7.3 -  Total Bilirubin 0.3 - 1.2 mg/dL - 0.5 -  Alkaline Phos 38 - 126 U/L - 63 -  AST 15 - 41 U/L - 33 -  ALT 0 - 44 U/L - 89(H) -    Impression/recommendation  Low grade fever yesterday but none since the 1 epiosde clinically no evidence of infection Risk for aspiration, atelectasis, uti UA no nitrite, rare bacteria -no evidence of UTI CXR rt- lower lobe atelectasis No antibiotics needed now  Hypoactive delirium -more interactive today  Cause multifactorial- hyponatremia,  constipation Na has also improved to 134> 132 She needs constant stimulation and activity Need PT/OT/speech, OOB, breathing exercise Clear constipation  Admitted on 9/12 with chest pain rt side, fever, headache and leg painandintermittent confusion  Found to have herpes zoster Thoracic dermatome  Herpes zoster with aseptic meningitis on admission ( patient had refused LPinitially)- was started on IV acyclovir 10mg /Kg q 8. She was alert and verbal and appropriate on presentation but after 5-6 days gradual deterioration to obtundation. Seen by neuro- delirium was questioned -EEG on 07/20/20 no seizure activity..There were many reasons including changes in sleep rhythm, poor intake, constipation, not wearing CPAp ( but no co2 narcosis) hyponatremia for the obtundation On 9/14 Had LP which revealed lymphocytic pleocytosis bacterial culture neg and HSV DNA neg- VZVneg .west Nile virus IgG in csf was positive but not IgM and serum antibodies neg- so this isless likelyacute WNV and IgG is not a reliable test for diagnosis of acute infection as there is cross reactivity with other flavi virus and dengue-There is no treatment for WNV.  MRI and MRA done on 9/14 no acute changes. Daughter wanted her to be in 10/14, but no bed and[facility]  transfer Repeat EEG done on 07/25/20 no seizure activity noted. 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 VZV PCR neg in csfbut VzV IgG >4000 Completed 21 days of acycolvir threapy for VZV induced meningo encephalitison 08/02/20 waxing and waning mentalstatus but in the past 48 hrs she is less responsive than before-unclear why- ? Hypodelirium? Hyponatremia  Urinary retention -9/12- has foley-failed voiding trial  on 9/28 and reinserted 9/29   Rt sided abdominal firmness and fullness CT abdomen showsFluid collectionpseudocyst  Complicated stay at [facility] between [facility]/20 until 05/14/20 for abdominal pain which was diagnosed as choledocholithiasis/cholangitis in a setting of prior RYGB in 2008. 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 and she was taken to [facility] 04/05/20 for diagnostic lap, robotic lysis of adhesions, transgastric ERCP and biliary sphincterotomy, cholecystectomy, partial gastrectomy andaccidentalcolotomy which was repaired.The immediate post op was complicated by post ERCP pancreatitis , pulmonary edema with acute hypoxic resp failure , Afib ( treated with amio and metoprolol), fever and leucocytosis suspected due to pancreatitis and was treated with zosyn, UTI due to proteus and acute on chronic anemia needing PRBC and AKI.She was seen by gerontologist for insomnia and risk for delirium    H/o left TKA  Hearing loss Discussed the management with her daughter yesterday  ID will follow remotely this weekend- call if needed

## 2020-08-10 NOTE — Evaluation (Signed)
Speech Language Pathology Evaluation Patient Details Name: Casey Baldwin MRN: [id number] DOB: [birth year] Today's Date: 08/10/2020 Time: 6160-7371 SLP Time Calculation (min) (ACUTE ONLY): 10 min  Problem List:  Patient Active Problem List   Diagnosis Date Noted  . Malnutrition of moderate degree 07/24/2020  . Anorexia 07/21/2020  . Sepsis (HCC) 07/13/2020  . Change in mental status 07/13/2020  . SIRS (systemic inflammatory response syndrome) (HCC) 07/12/2020  . Shingles rash 07/12/2020  . Delirium 07/12/2020  . Acute metabolic encephalopathy 07/12/2020  . Essential hypertension 07/12/2020  . Anemia 07/12/2020  . Acute lower UTI 07/12/2020  . Chronic diastolic CHF (congestive heart failure) (HCC) 07/12/2020   Past Medical History:  Past Medical History:  Diagnosis Date  . Coronary artery disease   . High cholesterol   . Hypertension    Past Surgical History:  Past Surgical History:  Procedure Laterality Date  . ABDOMINAL HYSTERECTOMY    . COLON RESECTION    . FACIAL COSMETIC SURGERY    . FRACTURE SURGERY     left leg and left ankle.  Marland Kitchen GASTRIC BYPASS    . left knee replacement     HPI:  ST consulted for cognitive re-training d/t hospital delirium (has been admitted for 28 days with prior lengthy admission in May thru June)   Assessment / Plan / Recommendation Clinical Impression  Pt presents with decreased sustained attention to task, orientation to self only and lengthy response times. At this time, this Clinical research associate provided extensive education to charge nurse, pt's nurse and NTs as well as pt's case Production designer, theatre/television/film. Recommend daily staff engage pt in cognitive stimulation as well as family creating a visitaiton schedule to provide conversation and/or pictures of family. Skilled cognitive re-training should be further addressed at next venue of care.     SLP Assessment  SLP Recommendation/Assessment: All further Speech Lanaguage Pathology  needs can be addressed in the next venue of  care SLP Visit Diagnosis: Cognitive communication deficit (R41.841)    Follow Up Recommendations  LTACH;Skilled Nursing facility    Frequency and Duration   N/A        SLP Evaluation Cognition  Overall Cognitive Status: No family/caregiver present to determine baseline cognitive functioning Arousal/Alertness: Awake/alert Orientation Level: Oriented to person;Disoriented to place;Disoriented to time;Disoriented to situation Attention: Sustained Sustained Attention: Impaired Sustained Attention Impairment: Verbal basic;Functional basic       Comprehension       Expression Verbal Expression Overall Verbal Expression: Other (comment)   Oral / Motor  Motor Speech Overall Motor Speech: Appears within functional limits for tasks assessed   GO                   Juno Bozard B. Dreama Saa M.S., CCC-SLP, [facility] Speech-Language Pathologist Rehabilitation Services Office [phone]  Reuel Derby 08/10/2020, 3:14 PM

## 2020-08-10 NOTE — Progress Notes (Signed)
RN advanced NG tube a few inches. RN notified Attending MD. Pt c/o pain, RN asked for one time dose of IV pain since pt is NPO at this moment

## 2020-08-10 NOTE — Plan of Care (Signed)

## 2020-08-10 NOTE — Progress Notes (Signed)
Cross Cover Note Abdominal distension and pain reported by bedside nurse. TF stopped and KUB obtained IMPRESSION: 1. Diffuse gaseous distention of the colon, likely colonic ileus. No evidence of small bowel obstruction. 2. Tip of the weighted enteric tube is in the region of the distal esophagus. Recommend advancement/repositioning.  Patient placed NPO Attempted x2 to advance DHT but both times coiled in back of throat.   Will ned advanced under fluoro in am Patient without nausea or vomitng.  She was noted to pass gas while I was at bedside and she did say she had to go to the bathroom so likely ileus is resolving on own,.

## 2020-08-10 NOTE — Progress Notes (Signed)
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 Rash is intensely pruritic different stages of healing some areas of vesicular be red border. Some areas covered in black eschars. Skin is diffusely tender.Per family (asked by me) pt developed confusion. Was c/o side to back pain.  Sed rate was elevated. LP was considered but patient declined stating that she had a history of meningitis in the past and very scared of having LP done. Hospitalist was called for admission for shingles and possible encephalitis.  9/8.IV acyclovir was changed to Valtrex. Patient has not had a bowel movement since admission, has poor appetite. Giving lactulose, increase the Protonix to twice a day.  9/9.Patient refused CPAP last night, she became more confused today. She also has reversed sleeping cycle. Added Seroquel 25 mg evening. Schedule lactulose twice a day for constipation.  9/10.Patient slept last night with Seroquel. Still very confused and fatigued.Seen by neurologist, suspect metabolic source. Repeated CT scan of the abdomen/pelvis, intra-abdominal fluid collection still present.  9/13.Dobbhoff placed, tube feeding started. Also consult psychiatry for 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  9/14.Patient mental status not improving. Had a fever of 102 yesterday evening. ID restart antibiotics with Rocephin and ampicillin. LP is performed today LP is performed.Study consistent with viral  meningitis/encephalitis. Discussed with infect disease and neurology, neurology concerned for subacute seizure. MRI of the brain and MRA will be performed. EEG is also ordered. However, patient will need a persistent EEG monitoring, initiate as process to transfer patient to a tertiary hospital. I have started the process with [facility] per family request. They do not want to transfer to [facility]. They are aware that the transfer process may take several days. Also startedseizure medicine per neurology. Discussed with DR. Veal from [facility] neurology, will arrange for transfer.  9/15: EEG: moderate diffuse encephlopahty, nonspecific. No seizure   9/17 more interactive today. EEG done.  9/19- pt more awake and alert, responding more to my questions.   9/21 I called [facility] transfer center, she is still on transfer list, but no beds available and told me they dont know when she will be transferred. Transfer was initiated last week by Dr. Chipper Herb Per families request. I updated the status to the daughter.  Hospital course from Dr. Wilfred Lacy 9/22-9/28/21: Pt's mental status has waxed and waned this week. No significant improvement. Pt completed the course of acyclovir. NG tube has been in over 2 weeks now and discussions have been w/ the daughter in regards to a PEG tube but pt's daughter wants to talk to the family before making that decision. Unsure if PEG tube would be an option, considering pt's extensive abd surg hx including gastric bypass which was initially done in 2008. [facility] declined the transfer on 08/06/20.   9/29 - daughter at bedside, upset and wants to know why [facility] did not take her mother for transfer. She also wants more explaination of her prognosis from neurologic stand point. Would like to get another neurologist opinion as she does not think pt has delirium/encphalopathy.  9/30-pt opens her eyes, more interactive. Tries to answer my questions , smiling and  waving hello to me.  10/1- Remains more awake, doesn't  Respond much to my questions. Overnight events noted, patient had abdominal distention, tube feeding was stopped and couple obtained revealing diffuse gas Deis distention of the colon likely colonic ileus no obstruction.  The tip of the enteric tube was in the esophagus.  Consultants:   ID  nephro  neuro  Procedures:   Antimicrobials:   Iv acyclovir- completed 21 days    Subjective: This am pt awake, doesn't really respond to my questions. Per nsg had big BM. TF stopped.    Objective: Vitals:   08/09/20 2324 08/10/20 0409 08/10/20 0451 08/10/20 0740  BP: 131/62 (!) 143/67  (!) 141/58  Pulse: 96 89  92  Resp: 16 16  18   Temp: 99 F (37.2 C) (!) 96.5 F (35.8 C)  98.2 F (36.8 C)  TempSrc: Oral Axillary  Oral  SpO2: 97% 96%  95%  Weight:   79.9 kg   Height:        Intake/Output Summary (Last 24 hours) at 08/10/2020 0805 Last data filed at 08/09/2020 2130 Gross per 24 hour  Intake 0 ml  Output 1100 ml  Net -1100 ml   Filed Weights   08/08/20 0500 08/09/20 0500 08/10/20 0451  Weight: 77.2 kg 73.1 kg 79.9 kg    Examination: Eyes open, awake, quiet.  cta anteriorly, no w/r/r RRR s1/s2 no m/r/g Soft +bs, nt, ND No edema Mood affect better than other days, but still needs improvement.      Data Reviewed: I have personally reviewed following labs and imaging studies  CBC: Recent Labs  Lab 08/04/20 0446 08/05/20 0503 08/06/20 0418 08/09/20 1042  WBC 9.8 10.9* 11.2* 14.5*  NEUTROABS  --   --   --  12.3*  HGB 9.6* 9.9* 10.3* 10.6*  HCT 27.1* 27.6* 28.8* 30.8*  MCV 94.4 94.8 94.1 96.6  PLT 381 389 403* 413*   Basic Metabolic Panel: Recent Labs  Lab 08/04/20 0446 08/05/20 0503 08/06/20 0418 08/09/20 1042  NA 127* 127* 127* 134*  K 4.2 4.4 4.4 3.9  CL 93* 93* 91* 95*  CO2 24 25 27 26   GLUCOSE 148* 156* 152* 192*  BUN 21 25* 26* 42*  CREATININE 0.60 0.49 0.57 0.59  CALCIUM 8.9 8.8* 9.1  9.3   GFR: Estimated Creatinine Clearance: 57.4 mL/min (by C-G formula based on SCr of 0.59 mg/dL). Liver Function Tests: Recent Labs  Lab 08/09/20 1042  AST 33  ALT 89*  ALKPHOS 63  BILITOT 0.5  PROT 7.3  ALBUMIN 3.0*   No results for input(s): LIPASE, AMYLASE in the last 168 hours. No results for input(s): AMMONIA in the last 168 hours. Coagulation Profile: No results for input(s): INR, PROTIME in the last 168 hours. Cardiac Enzymes: No results for input(s): CKTOTAL, CKMB, CKMBINDEX, TROPONINI in the last 168 hours. BNP (last 3 results) No results for input(s): PROBNP in the last 8760 hours. HbA1C: No results for input(s): HGBA1C in the last 72 hours. CBG: Recent Labs  Lab 08/09/20 2040 08/09/20 2320 08/10/20 0405 08/10/20 0535 08/10/20 0742  GLUCAP 184* 127* 144* 161* 123*   Lipid Profile: No results for input(s): CHOL, HDL, LDLCALC, TRIG, CHOLHDL, LDLDIRECT in the last 72 hours. Thyroid Function Tests: No results for input(s): TSH, T4TOTAL, FREET4, T3FREE, THYROIDAB in the last 72 hours. Anemia Panel: No results for input(s): VITAMINB12, FOLATE, FERRITIN, TIBC, IRON,  RETICCTPCT in the last 72 hours. Sepsis Labs: No results for input(s): PROCALCITON, LATICACIDVEN in the last 168 hours.  Recent Results (from the past 240 hour(s))  Urine Culture     Status: None   Collection Time: 07/31/20 11:26 PM   Specimen: Urine, Clean Catch  Result Value Ref Range Status   Specimen Description   Final    URINE, CLEAN CATCH Performed at [facility], [address] [place], [place] [address]    Special Requests   Final    NONE Performed at [facility], [address], [place], [place] [address]    Culture   Final    NO GROWTH Performed at [facility], [address] [place], [place] [address]    Report Status 08/01/2020 FINAL  Final         Radiology Studies: DG Abd 1 View  Result Date: 08/09/2020 CLINICAL DATA:  Abdominal  distension. EXAM: ABDOMEN - 1 VIEW COMPARISON:  Radiograph 08/06/2020, additional priors. FINDINGS: Tip of the weighted enteric tube is in the region of the distal esophagus. Colon is diffusely gaseous distended. There is moderate formed stool in the right colon, occasional colonic stool balls in the descending colon. Enteric sutures and surgical clips are noted in the midline pelvis. Scattered surgical clips in the upper abdomen and left mid abdomen. No evidence of small bowel dilatation or obstruction. No free air on supine view. No acute osseous abnormalities are seen. IMPRESSION: 1. Diffuse gaseous distention of the colon, likely colonic ileus. No evidence of small bowel obstruction. 2. Tip of the weighted enteric tube is in the region of the distal esophagus. Recommend advancement/repositioning. Electronically Signed   By: Narda Rutherford M.D.   On: 08/09/2020 21:27   DG Chest Port 1 View  Result Date: 08/09/2020 CLINICAL DATA:  Diffuse body aches. Headaches. Prior gastric surgery. EXAM: PORTABLE CHEST 1 VIEW COMPARISON:  07/17/2020 FINDINGS: New densities at the right lung base are most compatible with atelectasis. Atelectasis or scarring at the left lung base. There is a feeding tube extending into the lower chest and the tip is near the GE junction. Upper lungs are clear. Heart size is within normal limits and stable. Negative for a pneumothorax. IMPRESSION: 1. Increased patchy densities at the right lung base are suggestive for atelectasis. Small amount of atelectasis or scarring at the left lung base. 2. Feeding tube tip is near the GE junction.  Prior gastric surgery. Electronically Signed   By: Richarda Overlie M.D.   On: 08/09/2020 16:40        Scheduled Meds:  Chlorhexidine Gluconate Cloth  6 each Topical Daily   docusate  100 mg Per Tube BID   enoxaparin (LOVENOX) injection  40 mg Subcutaneous Q24H   feeding supplement (KATE FARMS STANDARD 1.4)  325 mL Oral Daily   Ferrous Fumarate  1  tablet Oral BID   free water  20 mL Per Tube Q6H   insulin aspart  0-9 Units Subcutaneous Q4H   lactulose  20 g Oral BID   liver oil-zinc oxide   Topical BID   melatonin  5 mg Oral QHS   metoprolol tartrate  12.5 mg Per Tube BID   modafinil  100 mg Oral Daily   multivitamin  15 mL Per Tube Daily   pantoprazole sodium  40 mg Per Tube Daily   rosuvastatin  20 mg Oral Daily   sodium chloride flush  3 mL Intravenous Q12H   tamsulosin  0.4  mg Oral QPC supper   thiamine  100 mg Oral Daily   Continuous Infusions:  sodium chloride 10 mL/hr at 07/30/20 0400   feeding supplement (VITAL AF 1.2 CAL) 1,000 mL (08/08/20 2040)    Assessment & Plan:   Active Problems:   SIRS (systemic inflammatory response syndrome) (HCC)   Shingles rash   Delirium   Acute metabolic encephalopathy   Essential hypertension   Anemia   Acute lower UTI   Chronic diastolic CHF (congestive heart failure) (HCC)   Sepsis (HCC)   Change in mental status   Anorexia   Malnutrition of moderate degree   Acute metabolic encephalopathy: etiology unclear, meningitis vs delirium. Mental status is labile. Mental status is worse today. S/p LP on 9/14. MRA/MRI head no acute intracranial abnormalities. Abxs were d/c. EEGs were neg for seizures or epileptiform discharges. CSF neg for HSV, West Nile, autoimmune encephalitis, enterovirus. 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  Completed acyclovir 21 day course. Pt's daughter refused provigil which was initially started by neuro  9/30-more interactive today and more awake this am.  Per neurology patient's altered mental status is combination of metabolic/infectious encephalopathy/hyponatremia and hospital delirium.  As often elderly patient's cognition may lag behind significant improvement even though medical issues are adequately addressed  10/1- It appears when pt's Na level is stable, her MS improves. Will need to  monitor closely to avoid a drop.  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   Nephrology initially consulted. Pt MS improves when Na levels more stable.  Today down to 132, spoke to Dr. Thedore MinsSingh, recommended sodium tablet bid.  Will closely monitor.   Abdominal pain/distention- obtained repeat abd. xr earlier since pt had BM. abd xr this am revealed no obstruction or ileus. Also TF in gastric cardia. Continue to monitor , make sure pt has bm.    Shingles: w/ aseptic meningitis. Completed 21-day course of acyclovir per ID recommendation   PSVT: No further episodes on telemetry, continue metoprolol Continue monitoring   Poor po intake: unchanged.  We will continue with tube feeding which initially was started on 07/23/2020.   P.o. intake still poor especially with her altered mental status.   Will need PEG tube placement especially going to SNF at some point as they will not take her with NG tube feeding.  I spoke to daughter yesterday awaiting to see if they want to continue with this plan.  Also not sure if PEG tube able to be placed with her extensive abdominal surgery history including gastric bypass. 10/1- still family needs to decide on PEG placement if able to have it done .     Thrombocytosis: Reactive. Continue to monitor   Sepsis: Resolved  Intra-abdominal fluid collection: w/ CT showing fluid collection likely pseudocyst, seroma or biloma, not abscess. IR did not recommend aspiration    Urinary retention:  Foley was discontinued initially however patient was retaining urine and bladder scan revealed more than 300 each time at 1 time over 600.  Foley was replaced again.   We will try to do more voiding trial in the near future once she her mental status continues to improve.    UTI: completed abx course. Resolved  Chronic diastolic CHF: Without exacerbation.  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 Will need PEG inorder to go to SNF.            LOS: 28 days   Time spent: 45 min with >50% on coc    Lynn Ito, MD [facility] Pager [phone]  If 7PM-7AM, please contact night-coverage [email or web address] Password [email or web address] 08/10/2020, 8:05 AM

## 2020-08-11 LAB — GLUCOSE, CAPILLARY
Glucose-Capillary: 149 mg/dL — ABNORMAL HIGH (ref 70–99)
Glucose-Capillary: 156 mg/dL — ABNORMAL HIGH (ref 70–99)
Glucose-Capillary: 161 mg/dL — ABNORMAL HIGH (ref 70–99)
Glucose-Capillary: 201 mg/dL — ABNORMAL HIGH (ref 70–99)
Glucose-Capillary: 171 mg/dL — ABNORMAL HIGH (ref 70–99)
Glucose-Capillary: 186 mg/dL — ABNORMAL HIGH (ref 70–99)

## 2020-08-11 LAB — SODIUM: Sodium: 135 mmol/L (ref 135–145)

## 2020-08-11 NOTE — Progress Notes (Signed)
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 Rash is intensely pruritic different stages of healing some areas of vesicular be red border. Some areas covered in black eschars. Skin is diffusely tender.Per family (asked by me) pt developed confusion. Was c/o side to back pain.  Sed rate was elevated. LP was considered but patient declined stating that she had a history of meningitis in the past and very scared of having LP done. Hospitalist was called for admission for shingles and possible encephalitis.  9/8.IV acyclovir was changed to Valtrex. Patient has not had a bowel movement since admission, has poor appetite. Giving lactulose, increase the Protonix to twice a day.  9/9.Patient refused CPAP last night, she became more confused today. She also has reversed sleeping cycle. Added Seroquel 25 mg evening. Schedule lactulose twice a day for constipation.  9/10.Patient slept last night with Seroquel. Still very confused and fatigued.Seen by neurologist, suspect metabolic source. Repeated CT scan of the abdomen/pelvis, intra-abdominal fluid collection still present.  9/13.Dobbhoff placed, tube feeding started. Also consult psychiatry for 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  9/14.Patient mental status not improving. Had a fever of 102 yesterday evening. ID restart antibiotics with Rocephin and ampicillin. LP is performed today LP is performed.Study consistent with viral  meningitis/encephalitis. Discussed with infect disease and neurology, neurology concerned for subacute seizure. MRI of the brain and MRA will be performed. EEG is also ordered. However, patient will need a persistent EEG monitoring, initiate as process to transfer patient to a tertiary hospital. I have started the process with [facility] per family request. They do not want to transfer to [facility]. They are aware that the transfer process may take several days. Also startedseizure medicine per neurology. Discussed with DR. Veal from [facility] neurology, will arrange for transfer.  9/15: EEG: moderate diffuse encephlopahty, nonspecific. No seizure   9/17 more interactive today. EEG done.  9/19- pt more awake and alert, responding more to my questions.   9/21 I called [facility] transfer center, she is still on transfer list, but no beds available and told me they dont know when she will be transferred. Transfer was initiated last week by Dr. Chipper Herb Per families request. I updated the status to the daughter.  Hospital course from Dr. Wilfred Lacy 9/22-9/28/21: Pt's mental status has waxed and waned this week. No significant improvement. Pt completed the course of acyclovir. NG tube has been in over 2 weeks now and discussions have been w/ the daughter in regards to a PEG tube but pt's daughter wants to talk to the family before making that decision. Unsure if PEG tube would be an option, considering pt's extensive abd surg hx including gastric bypass which was initially done in 2008. [facility] declined the transfer on 08/06/20.   9/29 - daughter at bedside, upset and wants to know why [facility] did not take her mother for transfer. She also wants more explaination of her prognosis from neurologic stand point. Would like to get another neurologist opinion as she does not think pt has delirium/encphalopathy.  9/30-pt opens her eyes, more interactive. Tries to answer my questions , smiling and  waving hello to me.  10/1- Remains more awake, doesn't  Respond much to my questions. Overnight events noted, patient had abdominal distention, tube feeding was stopped and couple obtained revealing diffuse gas Deis distention of the colon likely colonic ileus no obstruction.  The tip of the enteric tube was in the esophagus.  10/2 abdominal xr -stable NGT feeding in place, TF was started yesterday.    Consultants:   ID  nephro  neuro  Procedures:   Antimicrobials:   Iv acyclovir- completed 21 days    Subjective: pts eyes open, does not respond to my question. In mittens.   Objective: Vitals:   08/10/20 1629 08/10/20 2316 08/11/20 0500 08/11/20 0743  BP: (!) 150/68 (!) 149/77  133/62  Pulse: 94 98  96  Resp: 16 19  18   Temp: 98.7 F (37.1 C) 99 F (37.2 C)  99.1 F (37.3 C)  TempSrc: Oral Oral    SpO2: 93% 94%  98%  Weight:   76.7 kg   Height:        Intake/Output Summary (Last 24 hours) at 08/11/2020 0819 Last data filed at 08/11/2020 0651 Gross per 24 hour  Intake --  Output 1275 ml  Net -1275 ml   Filed Weights   08/09/20 0500 08/10/20 0451 08/11/20 0500  Weight: 73.1 kg 79.9 kg 76.7 kg    Examination: Eyes open, appears comfortable, NAD CTA, no wheeze rales rhonchi's RRR S1-S2 no murmurs rubs gallops Soft nontender nondistended +bs scd In place b/l , no edema b/l  Awake, doesn't respond to questions. In mittens.     Data Reviewed: I have personally reviewed following labs and imaging studies  CBC: Recent Labs  Lab 08/05/20 0503 08/06/20 0418 08/09/20 1042 08/10/20 0815  WBC 10.9* 11.2* 14.5* 12.8*  NEUTROABS  --   --  12.3*  --   HGB 9.9* 10.3* 10.6* 9.4*  HCT 27.6* 28.8* 30.8* 28.6*  MCV 94.8 94.1 96.6 99.7  PLT 389 403* 413* 346   Basic Metabolic Panel: Recent Labs  Lab 08/05/20 0503 08/06/20 0418 08/09/20 1042 08/10/20 0815 08/11/20 0421  NA 127* 127* 134* 132* 135  K 4.4 4.4 3.9  --   --   CL 93* 91* 95*  --   --   CO2  25 27 26   --   --   GLUCOSE 156* 152* 192*  --   --   BUN 25* 26* 42*  --   --   CREATININE 0.49 0.57 0.59  --   --   CALCIUM 8.8* 9.1 9.3  --   --    GFR: Estimated Creatinine Clearance: 56.2 mL/min (by C-G formula based on SCr of 0.59 mg/dL). Liver Function Tests: Recent Labs  Lab 08/09/20 1042  AST 33  ALT 89*  ALKPHOS 63  BILITOT 0.5  PROT 7.3  ALBUMIN 3.0*   No results for input(s): LIPASE, AMYLASE in the last 168 hours. No results for input(s): AMMONIA in the last 168 hours. Coagulation Profile: No results for input(s): INR, PROTIME in the last 168 hours. Cardiac Enzymes: No results for input(s): CKTOTAL, CKMB, CKMBINDEX, TROPONINI in the last 168 hours. BNP (last 3 results) No results for input(s): PROBNP in the last 8760 hours. HbA1C: No results for input(s): HGBA1C in the last 72 hours. CBG: Recent Labs  Lab 08/10/20 1649 08/10/20 2020 08/10/20 2320 08/11/20 0426 08/11/20 0744  GLUCAP 150* 135*  149* 156* 161*   Lipid Profile: No results for input(s): CHOL, HDL, LDLCALC, TRIG, CHOLHDL, LDLDIRECT in the last 72 hours. Thyroid Function Tests: No results for input(s): TSH, T4TOTAL, FREET4, T3FREE, THYROIDAB in the last 72 hours. Anemia Panel: No results for input(s): VITAMINB12, FOLATE, FERRITIN, TIBC, IRON, RETICCTPCT in the last 72 hours. Sepsis Labs: No results for input(s): PROCALCITON, LATICACIDVEN in the last 168 hours.  No results found for this or any previous visit (from the past 240 hour(s)).       Radiology Studies: DG Abd 1 View  Result Date: 08/10/2020 CLINICAL DATA:  Feeding tube positioning. EXAM: ABDOMEN - 1 VIEW COMPARISON:  August 10, 2020 (11:21 a.m.) FINDINGS: Stable nasogastric feeding tube positioning is seen with its distal tip overlying the expected region of the gastric cardia. Multiple adjacent surgical sutures and surgical clips are noted. The bowel gas pattern is normal. No radio-opaque calculi or other significant radiographic  abnormality are seen. Radiopaque surgical clips are seen within the mid left abdomen and right upper quadrant. IMPRESSION: Stable nasogastric feeding tube positioning with its distal tip overlying the expected region of the gastric cardia. Electronically Signed   By: Aram Candela M.D.   On: 08/10/2020 19:44   DG Abd 1 View  Addendum Date: 08/10/2020   ADDENDUM REPORT: 08/10/2020 16:17 ADDENDUM: Feeding tube optimally should be advanced further into the stomach to prevent potential for gastroesophageal reflux. The proximity of the feeding tube tip to the gastroesophageal junction potentially may result in reflux of food material. Electronically Signed   By: Bretta Bang III M.D.   On: 08/10/2020 16:17   Result Date: 08/10/2020 CLINICAL DATA:  Ileus EXAM: ABDOMEN - 1 VIEW COMPARISON:  August 09, 2020 FINDINGS: Endotracheal tube tip is in the gastric cardia region. There are postoperative changes in the left abdomen and pelvis as well as in the right upper quadrant. There is currently no bowel dilatation or air-fluid level to suggest bowel obstruction. No free air. Visualized lung bases clear. IMPRESSION: Feeding tube tip in gastric cardia region. Currently no bowel dilatation or air-fluid level to suggest bowel obstruction. No evident free air on supine examination. Lung bases clear. Postoperative changes at multiple sites noted. Electronically Signed: By: Bretta Bang III M.D. On: 08/10/2020 11:41   DG Abd 1 View  Result Date: 08/09/2020 CLINICAL DATA:  Abdominal distension. EXAM: ABDOMEN - 1 VIEW COMPARISON:  Radiograph 08/06/2020, additional priors. FINDINGS: Tip of the weighted enteric tube is in the region of the distal esophagus. Colon is diffusely gaseous distended. There is moderate formed stool in the right colon, occasional colonic stool balls in the descending colon. Enteric sutures and surgical clips are noted in the midline pelvis. Scattered surgical clips in the upper abdomen  and left mid abdomen. No evidence of small bowel dilatation or obstruction. No free air on supine view. No acute osseous abnormalities are seen. IMPRESSION: 1. Diffuse gaseous distention of the colon, likely colonic ileus. No evidence of small bowel obstruction. 2. Tip of the weighted enteric tube is in the region of the distal esophagus. Recommend advancement/repositioning. Electronically Signed   By: Narda Rutherford M.D.   On: 08/09/2020 21:27   DG Chest Port 1 View  Result Date: 08/09/2020 CLINICAL DATA:  Diffuse body aches. Headaches. Prior gastric surgery. EXAM: PORTABLE CHEST 1 VIEW COMPARISON:  07/17/2020 FINDINGS: New densities at the right lung base are most compatible with atelectasis. Atelectasis or scarring at the left lung base. There is a feeding tube extending  into the lower chest and the tip is near the GE junction. Upper lungs are clear. Heart size is within normal limits and stable. Negative for a pneumothorax. IMPRESSION: 1. Increased patchy densities at the right lung base are suggestive for atelectasis. Small amount of atelectasis or scarring at the left lung base. 2. Feeding tube tip is near the GE junction.  Prior gastric surgery. Electronically Signed   By: Richarda OverlieAdam  Henn M.D.   On: 08/09/2020 16:40        Scheduled Meds: . Chlorhexidine Gluconate Cloth  6 each Topical Daily  . docusate  100 mg Per Tube BID  . enoxaparin (LOVENOX) injection  40 mg Subcutaneous Q24H  . feeding supplement (KATE FARMS STANDARD 1.4)  325 mL Oral Daily  . Ferrous Fumarate  1 tablet Oral BID  . free water  20 mL Per Tube Q6H  . insulin aspart  0-9 Units Subcutaneous Q4H  . lactulose  20 g Oral BID  . liver oil-zinc oxide   Topical BID  . melatonin  5 mg Oral QHS  . metoprolol tartrate  12.5 mg Per Tube BID  . modafinil  100 mg Oral Daily  . multivitamin  15 mL Per Tube Daily  . pantoprazole sodium  40 mg Per Tube Daily  . rosuvastatin  20 mg Oral Daily  . sodium chloride flush  3 mL  Intravenous Q12H  . sodium chloride  1 g Oral BID WC  . tamsulosin  0.4 mg Oral QPC supper  . thiamine  100 mg Oral Daily   Continuous Infusions: . sodium chloride 10 mL/hr at 07/30/20 0400  . feeding supplement (VITAL AF 1.2 CAL) 1,000 mL (08/08/20 2040)    Assessment & Plan:   Active Problems:   SIRS (systemic inflammatory response syndrome) (HCC)   Shingles rash   Delirium   Acute metabolic encephalopathy   Essential hypertension   Anemia   Acute lower UTI   Chronic diastolic CHF (congestive heart failure) (HCC)   Sepsis (HCC)   Change in mental status   Anorexia   Malnutrition of moderate degree   Acute metabolic encephalopathy: etiology unclear, meningitis vs delirium. Mental status is labile. Mental status is worse today. S/p LP on 9/14. MRA/MRI head no acute intracranial abnormalities. Abxs were d/c. EEGs were neg for seizures or epileptiform discharges. CSF neg for HSV, West Nile, autoimmune encephalitis, enterovirus. 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  Completed acyclovir 21 day course. Pt's daughter refused provigil which was initially started by neuro  9/30-more interactive today and more awake this am.  Per neurology patient's altered mental status is combination of metabolic/infectious encephalopathy/hyponatremia and hospital delirium.  As often elderly patient's cognition may lag behind significant improvement even though medical issues are adequately addressed  10/2- I do think patient sodium level affects her mental status in concurrence with her other issues.  When sodium level is more stable she is more awake and interactive.   She was started on salt tablets after discussion with nephrology.   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   Nephrology initially consulted. Pt MS improves when Na levels more stable.    10/2- Na level stable at 135 today.  We will continue with fluid restriction and salt tablets  Monitor levels closely    Abdominal pain/distention- obtained repeat abd. xr earlier since pt had BM. abd xr this am revealed  no obstruction or ileus.  10/2-No indication the patient has pain today.  She did have a bowel movement previously which helped. Repeat abdominal x-ray revealed stabilization of tube feeding and she is currently tolerating it.     Shingles: w/ aseptic meningitis. Completed 21-day course of acyclovir per IDs recommendation   PSVT: No further episodes on telemetry, continue metoprolol Continue monitoring   Poor po intake: unchanged.  We will continue with tube feeding which initially was started on 07/23/2020.   P.o. intake still poor especially with her altered mental status.   Will need PEG tube placement especially going to SNF at some point as they will not take her with NG tube feeding.  I spoke to daughter yesterday awaiting to see if they want to continue with this plan.  Also not sure if PEG tube able to be placed with her extensive abdominal surgery history including gastric bypass. 10/2-Daughter Darl Pikes does not want PEG placement here and wants it done at [facility].  I did tell her that she cannot go to SNF when she is ready if she is unable to take p.o. intake or have a PEG placement.     Thrombocytosis: Reactive. Continue to monitor   Sepsis: Resolved  Intra-abdominal fluid collection: w/ CT showing fluid collection likely pseudocyst, seroma or biloma, not abscess. IR did not recommend aspiration    Urinary retention:  Foley was discontinued initially however patient was retaining urine and bladder scan revealed more than 300 each time at 1 time over 600.  Foley was replaced again.   We will try to do more voiding trial in the near future once she her mental status continues to improve.    UTI: completed abx course. Resolved  Chronic diastolic  CHF: Without exacerbation.  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  She has spoken to [facility] on [facility] but has not heard back.  Daughter does not want PEG placement here and prefers it to be done at [facility].  I did go over with the daughter that physical therapy, OT, and speech has been following the patient as the record indicates.  I did tell the daughter that is likely she has not seen him because they come in the afternoon when she is not here.  Daughter complained that she is not getting proper care here.   I was told by Dr. Sherryll Burger, Daughter [name] wants palliative care, and will consult palliative Status is: Inpatient  Remains inpatient appropriate because:Altered mental status   Dispo: The patient is from: Home              Anticipated d/c is to: SNF              Anticipated d/c date is: > 3 days              Patient currently is not medically stable to d/c.Pt still with altered MS. Will need PEG inorder to go to SNF.            LOS: 29 days   Time spent: 45 min with >50% on coc    Lynn Ito, MD [facility] Pager [phone]  If 7PM-7AM, please contact night-coverage [email or web address] Password [facility] 08/11/2020, 8:19 AM

## 2020-08-12 LAB — GLUCOSE, CAPILLARY
Glucose-Capillary: 134 mg/dL — ABNORMAL HIGH (ref 70–99)
Glucose-Capillary: 141 mg/dL — ABNORMAL HIGH (ref 70–99)
Glucose-Capillary: 163 mg/dL — ABNORMAL HIGH (ref 70–99)
Glucose-Capillary: 166 mg/dL — ABNORMAL HIGH (ref 70–99)
Glucose-Capillary: 167 mg/dL — ABNORMAL HIGH (ref 70–99)
Glucose-Capillary: 173 mg/dL — ABNORMAL HIGH (ref 70–99)
Glucose-Capillary: 187 mg/dL — ABNORMAL HIGH (ref 70–99)

## 2020-08-12 NOTE — Progress Notes (Addendum)
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 Rash is intensely pruritic different stages of healing some areas of vesicular be red border. Some areas covered in black eschars. Skin is diffusely tender.Per family (asked by me) pt developed confusion. Was c/o side to back pain.  Sed rate was elevated. LP was considered but patient declined stating that she had a history of meningitis in the past and very scared of having LP done. Hospitalist was called for admission for shingles and possible encephalitis.  9/8.IV acyclovir was changed to Valtrex. Patient has not had a bowel movement since admission, has poor appetite. Giving lactulose, increase the Protonix to twice a day.  9/9.Patient refused CPAP last night, she became more confused today. She also has reversed sleeping cycle. Added Seroquel 25 mg evening. Schedule lactulose twice a day for constipation.  9/10.Patient slept last night with Seroquel. Still very confused and fatigued.Seen by neurologist, suspect metabolic source. Repeated CT scan of the abdomen/pelvis, intra-abdominal fluid collection still present.  9/13.Dobbhoff placed, tube feeding started. Also consult psychiatry for 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  9/14.Patient mental status not improving. Had a fever of 102 yesterday evening. ID restart antibiotics with Rocephin and ampicillin. LP is performed today LP is performed.Study consistent with viral  meningitis/encephalitis. Discussed with infect disease and neurology, neurology concerned for subacute seizure. MRI of the brain and MRA will be performed. EEG is also ordered. However, patient will need a persistent EEG monitoring, initiate as process to transfer patient to a tertiary hospital. I have started the process with [facility] per family request. They do not want to transfer to [facility]. They are aware that the transfer process may take several days. Also startedseizure medicine per neurology. Discussed with DR. Veal from [facility] neurology, will arrange for transfer.  9/15: EEG: moderate diffuse encephlopahty, nonspecific. No seizure   9/17 more interactive today. EEG done.  9/19- pt more awake and alert, responding more to my questions.   9/21 I called [facility] transfer center, she is still on transfer list, but no beds available and told me they dont know when she will be transferred. Transfer was initiated last week by Dr. Chipper Herb Per families request. I updated the status to the daughter.  Hospital course from Dr. Wilfred Lacy 9/22-9/28/21: Pt's mental status has waxed and waned this week. No significant improvement. Pt completed the course of acyclovir. NG tube has been in over 2 weeks now and discussions have been w/ the daughter in regards to a PEG tube but pt's daughter wants to talk to the family before making that decision. Unsure if PEG tube would be an option, considering pt's extensive abd surg hx including gastric bypass which was initially done in 2008. [facility] declined the transfer on 08/06/20.   9/29 - daughter at bedside, upset and wants to know why [facility] did not take her mother for transfer. She also wants more explaination of her prognosis from neurologic stand point. Would like to get another neurologist opinion as she does not think pt has delirium/encphalopathy.  9/30-pt opens her eyes, more interactive. Tries to answer my questions , smiling and  waving hello to me.  10/1- Remains more awake, doesn't  Respond much to my questions. Overnight events noted, patient had abdominal distention, tube feeding was stopped and couple obtained revealing diffuse gas Deis distention of the colon likely colonic ileus no obstruction.  The tip of the enteric tube was in the esophagus.  10/2 abdominal xr -stable NGT feeding in place, TF was started yesterday.  10/3- opens eyes with sternal rub, but screamed at me with sternal rub.   Consultants:   ID  nephro  neuro  Procedures:   Antimicrobials:   Iv acyclovir- completed 21 days    Subjective: Opens eyes with sternal rub, but checks it again.  Does not speak to me today.  Did scream when I did a sternal rub on her  Objective: Vitals:   08/11/20 1608 08/12/20 0012 08/12/20 0500 08/12/20 0734  BP: (!) 152/72 (!) 147/69  (!) 162/69  Pulse: (!) 105 (!) 105  (!) 101  Resp: 18 19  18   Temp: 98.5 F (36.9 C) 97.9 F (36.6 C)  97.9 F (36.6 C)  TempSrc: Oral Oral    SpO2: 95% 97%  100%  Weight:   76.7 kg   Height:        Intake/Output Summary (Last 24 hours) at 08/12/2020 0801 Last data filed at 08/12/2020 0500 Gross per 24 hour  Intake 0 ml  Output 900 ml  Net -900 ml   Filed Weights   08/10/20 0451 08/11/20 0500 08/12/20 0500  Weight: 79.9 kg 76.7 kg 76.7 kg    Examination: Opens eyes, not very interactive verbally, NAD  CTA, no wheeze rales rhonchi's  RRR S1-S2 no murmurs rubs gallops  Soft nontender nondistended positive bowel sounds  SCD in place, no edema upper extremity in mittens Unable to do full neurologic exam     Data Reviewed: I have personally reviewed following labs and imaging studies  CBC: Recent Labs  Lab 08/06/20 0418 08/09/20 1042 08/10/20 0815  WBC 11.2* 14.5* 12.8*  NEUTROABS  --  12.3*  --   HGB 10.3* 10.6* 9.4*  HCT 28.8* 30.8* 28.6*  MCV 94.1 96.6 99.7  PLT 403* 413* 346   Basic Metabolic Panel: Recent Labs  Lab 08/06/20 0418  08/09/20 1042 08/10/20 0815 08/11/20 0421  NA 127* 134* 132* 135  K 4.4 3.9  --   --   CL 91* 95*  --   --   CO2 27 26  --   --   GLUCOSE 152* 192*  --   --   BUN 26* 42*  --   --   CREATININE 0.57 0.59  --   --   CALCIUM 9.1 9.3  --   --    GFR: Estimated Creatinine Clearance: 56.2 mL/min (by C-G formula based on SCr of 0.59 mg/dL). Liver Function Tests: Recent Labs  Lab 08/09/20 1042  AST 33  ALT 89*  ALKPHOS 63  BILITOT 0.5  PROT 7.3  ALBUMIN 3.0*   No results for input(s): LIPASE, AMYLASE in the last 168 hours. No results for input(s): AMMONIA in the last 168 hours. Coagulation Profile: No results for input(s): INR, PROTIME in the last 168 hours. Cardiac Enzymes: No results for input(s): CKTOTAL, CKMB, CKMBINDEX, TROPONINI in the last 168 hours. BNP (last 3 results) No results for input(s): PROBNP in the last 8760 hours. HbA1C: No results for input(s): HGBA1C in the last 72  hours. CBG: Recent Labs  Lab 08/11/20 1636 08/11/20 2022 08/12/20 0016 08/12/20 0221 08/12/20 0506  GLUCAP 171* 186* 173* 166* 167*   Lipid Profile: No results for input(s): CHOL, HDL, LDLCALC, TRIG, CHOLHDL, LDLDIRECT in the last 72 hours. Thyroid Function Tests: No results for input(s): TSH, T4TOTAL, FREET4, T3FREE, THYROIDAB in the last 72 hours. Anemia Panel: No results for input(s): VITAMINB12, FOLATE, FERRITIN, TIBC, IRON, RETICCTPCT in the last 72 hours. Sepsis Labs: No results for input(s): PROCALCITON, LATICACIDVEN in the last 168 hours.  No results found for this or any previous visit (from the past 240 hour(s)).       Radiology Studies: DG Abd 1 View  Result Date: 08/10/2020 CLINICAL DATA:  Feeding tube positioning. EXAM: ABDOMEN - 1 VIEW COMPARISON:  August 10, 2020 (11:21 a.m.) FINDINGS: Stable nasogastric feeding tube positioning is seen with its distal tip overlying the expected region of the gastric cardia. Multiple adjacent surgical sutures and surgical clips are  noted. The bowel gas pattern is normal. No radio-opaque calculi or other significant radiographic abnormality are seen. Radiopaque surgical clips are seen within the mid left abdomen and right upper quadrant. IMPRESSION: Stable nasogastric feeding tube positioning with its distal tip overlying the expected region of the gastric cardia. Electronically Signed   By: Aram Candela M.D.   On: 08/10/2020 19:44   DG Abd 1 View  Addendum Date: 08/10/2020   ADDENDUM REPORT: 08/10/2020 16:17 ADDENDUM: Feeding tube optimally should be advanced further into the stomach to prevent potential for gastroesophageal reflux. The proximity of the feeding tube tip to the gastroesophageal junction potentially may result in reflux of food material. Electronically Signed   By: Bretta Bang III M.D.   On: 08/10/2020 16:17   Result Date: 08/10/2020 CLINICAL DATA:  Ileus EXAM: ABDOMEN - 1 VIEW COMPARISON:  August 09, 2020 FINDINGS: Endotracheal tube tip is in the gastric cardia region. There are postoperative changes in the left abdomen and pelvis as well as in the right upper quadrant. There is currently no bowel dilatation or air-fluid level to suggest bowel obstruction. No free air. Visualized lung bases clear. IMPRESSION: Feeding tube tip in gastric cardia region. Currently no bowel dilatation or air-fluid level to suggest bowel obstruction. No evident free air on supine examination. Lung bases clear. Postoperative changes at multiple sites noted. Electronically Signed: By: Bretta Bang III M.D. On: 08/10/2020 11:41        Scheduled Meds: . Chlorhexidine Gluconate Cloth  6 each Topical Daily  . docusate  100 mg Per Tube BID  . enoxaparin (LOVENOX) injection  40 mg Subcutaneous Q24H  . feeding supplement (KATE FARMS STANDARD 1.4)  325 mL Oral Daily  . Ferrous Fumarate  1 tablet Oral BID  . free water  20 mL Per Tube Q6H  . insulin aspart  0-9 Units Subcutaneous Q4H  . lactulose  20 g Oral BID  . liver  oil-zinc oxide   Topical BID  . melatonin  5 mg Oral QHS  . metoprolol tartrate  12.5 mg Per Tube BID  . modafinil  100 mg Oral Daily  . multivitamin  15 mL Per Tube Daily  . pantoprazole sodium  40 mg Per Tube Daily  . rosuvastatin  20 mg Oral Daily  . sodium chloride flush  3 mL Intravenous Q12H  . sodium chloride  1 g Oral BID WC  . tamsulosin  0.4 mg Oral QPC supper  . thiamine  100 mg Oral Daily   Continuous Infusions: .  sodium chloride 10 mL/hr at 07/30/20 0400  . feeding supplement (VITAL AF 1.2 CAL) 1,000 mL (08/11/20 2047)    Assessment & Plan:   Active Problems:   SIRS (systemic inflammatory response syndrome) (HCC)   Shingles rash   Delirium   Acute metabolic encephalopathy   Essential hypertension   Anemia   Acute lower UTI   Chronic diastolic CHF (congestive heart failure) (HCC)   Sepsis (HCC)   Change in mental status   Anorexia   Malnutrition of moderate degree   Acute metabolic encephalopathy: etiology unclear, meningitis vs delirium. Mental status is labile. Mental status is worse today. S/p LP on 9/14. MRA/MRI head no acute intracranial abnormalities. Abxs were d/c. EEGs were neg for seizures or epileptiform discharges. CSF neg for HSV, West Nile, autoimmune encephalitis, enterovirus. 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  Completed acyclovir 21 day course. Pt's daughter refused provigil which was initially started by neuro  Per neurology patient's altered mental status is combination of metabolic/infectious encephalopathy (varicella associated encephalitis) /hyponatremia and hospital delirium.  As often elderly patient's cognition may lag behind significant improvement even though medical issues are adequately addressed  10/3-need to keep Na level within normal limits as this also has been playing a role in her MS change.  Will continue supportive care.  Palliative care consulted, pending. 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   Nephrology initially consulted. 10/2- Na level stable at 135  We will continue with fluid restriction and salt tablets  Monitor levels closely  10/3-no labs today. Will ck periodically. Giving pt break from labs draws.   Abdominal pain/distention- obtained repeat abd. xr earlier since pt had BM. abd xr this am revealed no obstruction or ileus.  10/2-No indication the patient has pain today.  She did have a bowel movement previously which helped. Repeat abdominal x-ray revealed stabilization of tube feeding and she is currently tolerating it. 10/3 asx currently. Tolerating TF, will continue to monitor. Continue bowel regimen     Shingles: w/ aseptic meningitis/encephalitis. Completed 21-day course of acyclovir per IDs recommendation    PSVT:  Continue on beta blockers   Poor po intake: unchanged.  We will continue with tube feeding which initially was started on 07/23/2020.   P.o. intake still poor especially with her altered mental status.   Will need PEG tube placement especially going to SNF at some point as they will not take her with NG tube feeding.  Family does not want PEG to be placed here, prefer going to [facility], although aware that we cannot transfer the patient to [facility] just for PEG tube placement.  She will need PEG tube placement prior to going to SNF as they will not take her onto NG tube feeding Family still deciding on this     Thrombocytosis: Reactive. Continue to monitor   Sepsis: Resolved  Intra-abdominal fluid collection: w/ CT showing fluid collection likely pseudocyst, seroma or biloma, not abscess. IR did not recommend aspiration    Urinary retention:  Foley was discontinued initially however patient was retaining urine and bladder scan revealed more than 300 each time at 1 time over 600.  Foley was replaced again.   We will try to do more voiding  trial in the near future once she her mental status continues to improve.    UTI: completed abx course. Resolved  Chronic diastolic CHF: Without acute exacerbation.   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 Will need PEG inorder to go to SNF.pallaitive consulted            LOS: 30 days   Time spent: 35 min with >50% on coc    Lynn Ito, MD [facility] Pager [phone]  If 7PM-7AM, please contact night-coverage [email or web address] Password [email or web address] 08/12/2020, 8:01 AM

## 2020-08-12 NOTE — Progress Notes (Addendum)
Subjective: Some waxing and waning in her mental status, better today than yesterday, but was able to recognize her daughter some yesterday.  Exam: Vitals:   08/12/20 0012 08/12/20 0734  BP: (!) 147/69 (!) 162/69  Pulse: (!) 105 (!) 101  Resp: 19 18  Temp: 97.9 F (36.6 C) 97.9 F (36.6 C)  SpO2: 97% 100%   Gen: In bed, NAD Resp: non-labored breathing, no acute distress Abd: soft, nt  Neuro: MS: Awake, able to identify her daughter, able to give me her name, answers questions with delayed processing and increased latency of response.  Does not give an answer when I asked her her location. CN: Extraocular movements intact, she fixates and tracks this examiner, face symmetric Motor: She moves all extremities to command, guards her lower extremities Sensory: Responds to stimulus in all four extremities  Pertinent Labs: Three EEGs on 9/10, 9/15, 9/17 all of which are negative. B12 on 9/27-444 TSH slightly elevated on 9/26 at seven, but free T4 was normal A.m. cortisol on 9/17-15 B1 was checked on 9/10 and was normal at 91  CSF WBC 274 CSF RBC-23 CSF protein-233 CSF glucose-72 CSF VDRL-negative CSF enterovirus PCR-negative  VZV IgM, CSF-normal VZV-IgG, CSF-markedly, markedly elevated VZV PCR CSF-normal HSV PCR-negative Zero oligoclonal bands Autoimmune encephalopathy panel including NMDA, AMPAR1,GABA B receptor, LGI one, CASPR2, DPPx was negative. Crypto antigen was negative West Nile IgG and IgM in the serum was negative   Impression: 79 year old female who presented with fever, encephalopathy, diffuse body aches and headache in the setting of recent zoster type rash.  LP was discussed on admission, the but the patient declined saying that she was scared of LPs.  Her VZV PCR was negative, but she was started on acyclovir from admission and LP was done on 9/14.  Given the marked elevation of IgG in the CSF, I still suspect that this does represent varicella associated  encephalitis. With the degree of pleocytosis and the infectious stigmata on admission, even if not VZV, I think that infectious etiology is by far the most likely.  She has not had any clear seizure activity, and multiple EEGs have been negative.  I think that the yield of a continuous EEG is relatively low at this time.  At this point, care is supportive moving forward.  Her daughter has multiple questions about prognosis.  I indicate that I do think that she has some potential for recovery, though to what extent I am not at all certain of given her age.  In a younger patient, I would likely strongly advocate for continued supportive care including PEG placement if needed, but given her advanced age, other considerations such as comfort might be reasonable to be considered.  I discussed these things with her daughter today, and she has multiple questions, I think it may benefit her to have palliative care discuss next steps, though I would likely continue to favor supportive care given that I think she may have some improvement over time.  Recommendations: 1) continue supportive care 2) could consider palliative care consultation to help discuss ultimate goals and help daughter to work through these decisions. 3) neurology will be available on an as-needed basis moving forward.  Please call if any questions or concerns arise.  Ritta Slot, MD [facility] [phone]  If 7pm- 7am, please page neurology on call as listed in AMION.

## 2020-08-13 DIAGNOSIS — B029 Zoster without complications: Secondary | ICD-10-CM | POA: Diagnosis not present

## 2020-08-13 DIAGNOSIS — R079 Chest pain, unspecified: Secondary | ICD-10-CM | POA: Diagnosis not present

## 2020-08-13 DIAGNOSIS — R410 Disorientation, unspecified: Secondary | ICD-10-CM | POA: Diagnosis not present

## 2020-08-13 DIAGNOSIS — R509 Fever, unspecified: Secondary | ICD-10-CM | POA: Diagnosis not present

## 2020-08-13 LAB — SODIUM: Sodium: 140 mmol/L (ref 135–145)

## 2020-08-13 LAB — GLUCOSE, CAPILLARY
Glucose-Capillary: 122 mg/dL — ABNORMAL HIGH (ref 70–99)
Glucose-Capillary: 160 mg/dL — ABNORMAL HIGH (ref 70–99)
Glucose-Capillary: 161 mg/dL — ABNORMAL HIGH (ref 70–99)
Glucose-Capillary: 161 mg/dL — ABNORMAL HIGH (ref 70–99)
Glucose-Capillary: 183 mg/dL — ABNORMAL HIGH (ref 70–99)
Glucose-Capillary: 190 mg/dL — ABNORMAL HIGH (ref 70–99)
Glucose-Capillary: 262 mg/dL — ABNORMAL HIGH (ref 70–99)

## 2020-08-13 LAB — VITAMIN B1: Vitamin B1 (Thiamine): 199.7 nmol/L (ref 66.5–200.0)

## 2020-08-13 MED ORDER — ACETAMINOPHEN 650 MG RE SUPP
650.0000 mg | Freq: Four times a day (QID) | RECTAL | Status: DC | PRN
  Administered 2020-08-23 – 2020-08-24 (×2): 650 mg via RECTAL
  Filled 2020-08-13 (×2): qty 1

## 2020-08-13 MED ORDER — ACETAMINOPHEN 160 MG/5ML PO SOLN
650.0000 mg | Freq: Four times a day (QID) | ORAL | Status: DC | PRN
  Administered 2020-08-20: 650 mg via ORAL
  Filled 2020-08-13 (×3): qty 20.3

## 2020-08-13 NOTE — Progress Notes (Signed)
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 Rash is intensely pruritic different stages of healing some areas of vesicular be red border. Some areas covered in black eschars. Skin is diffusely tender.Per family (asked by me) pt developed confusion. Was c/o side to back pain.  Sed rate was elevated. LP was considered but patient declined stating that she had a history of meningitis in the past and very scared of having LP done. Hospitalist was called for admission for shingles and possible encephalitis.  9/8.IV acyclovir was changed to Valtrex. Patient has not had a bowel movement since admission, has poor appetite. Giving lactulose, increase the Protonix to twice a day.  9/9.Patient refused CPAP last night, she became more confused today. She also has reversed sleeping cycle. Added Seroquel 25 mg evening. Schedule lactulose twice a day for constipation.  9/10.Patient slept last night with Seroquel. Still very confused and fatigued.Seen by neurologist, suspect metabolic source. Repeated CT scan of the abdomen/pelvis, intra-abdominal fluid collection still present.  9/13.Dobbhoff placed, tube feeding started. Also consult psychiatry for 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  9/14.Patient mental status not improving. Had a fever of 102 yesterday evening. ID restart antibiotics with Rocephin and ampicillin. LP is performed today LP is performed.Study consistent with viral  meningitis/encephalitis. Discussed with infect disease and neurology, neurology concerned for subacute seizure. MRI of the brain and MRA will be performed. EEG is also ordered. However, patient will need a persistent EEG monitoring, initiate as process to transfer patient to a tertiary hospital. I have started the process with [facility] per family request. They do not want to transfer to [facility]. They are aware that the transfer process may take several days. Also startedseizure medicine per neurology. Discussed with DR. Veal from [facility] neurology, will arrange for transfer.  9/15: EEG: moderate diffuse encephlopahty, nonspecific. No seizure   9/17 more interactive today. EEG done.  9/19- pt more awake and alert, responding more to my questions.   9/21 I called [facility] transfer center, she is still on transfer list, but no beds available and told me they dont know when she will be transferred. Transfer was initiated last week by Dr. Chipper Herb Per families request. I updated the status to the daughter.  Hospital course from Dr. Wilfred Lacy 9/22-9/28/21: Pt's mental status has waxed and waned this week. No significant improvement. Pt completed the course of acyclovir. NG tube has been in over 2 weeks now and discussions have been w/ the daughter in regards to a PEG tube but pt's daughter wants to talk to the family before making that decision. Unsure if PEG tube would be an option, considering pt's extensive abd surg hx including gastric bypass which was initially done in 2008. [facility] declined the transfer on 08/06/20.   9/29 - daughter at bedside, upset and wants to know why [facility] did not take her mother for transfer. She also wants more explaination of her prognosis from neurologic stand point. Would like to get another neurologist opinion as she does not think pt has delirium/encphalopathy.  9/30-pt opens her eyes, more interactive. Tries to answer my questions , smiling and  waving hello to me.  10/1- Remains more awake, doesn't  Respond much to my questions. Overnight events noted, patient had abdominal distention, tube feeding was stopped and couple obtained revealing diffuse gas Deis distention of the colon likely colonic ileus no obstruction.  The tip of the enteric tube was in the esophagus.  10/2 abdominal xr -stable NGT feeding in place, TF was started yesterday.  10/3- opens eyes with sternal rub, but screamed at me with sternal rub.   10/4: eyes more open. Per nsg, she ate more this am.and actually stated she was hungry.    Consultants:   ID  nephro  neuro  Procedures:   Antimicrobials:   Iv acyclovir- completed 21 days    Subjective: Eyes open, trying to respond some to my questions.   Objective: Vitals:   08/12/20 1532 08/12/20 2358 08/13/20 0500 08/13/20 0754  BP: 128/74 132/68  135/68  Pulse: (!) 102 96  92  Resp: 16 18  20   Temp: 98.9 F (37.2 C) 98.5 F (36.9 C)  97.9 F (36.6 C)  TempSrc: Axillary Oral  Axillary  SpO2: 100% 99%  100%  Weight:   76.2 kg   Height:        Intake/Output Summary (Last 24 hours) at 08/13/2020 0811 Last data filed at 08/13/2020 0500 Gross per 24 hour  Intake 120 ml  Output 450 ml  Net -330 ml   Filed Weights   08/11/20 0500 08/12/20 0500 08/13/20 0500  Weight: 76.7 kg 76.7 kg 76.2 kg    Examination: More awake today.  NAD, comfortable  CTA, no wheeze rales rhonchi's  RRR S1-S2 no murmurs gallops  Soft benign positive bowel sounds nontender upon palpation  No edema bilaterally SCDs in place  Unable to assess neuro exam  Mood and affect appropriate in current setting     Data Reviewed: I have personally reviewed following labs and imaging studies  CBC: Recent Labs  Lab 08/09/20 1042 08/10/20 0815  WBC 14.5* 12.8*  NEUTROABS 12.3*  --   HGB 10.6* 9.4*  HCT 30.8* 28.6*  MCV 96.6 99.7  PLT 413* 346   Basic Metabolic Panel: Recent Labs  Lab 08/09/20 1042 08/10/20 0815  08/11/20 0421  NA 134* 132* 135  K 3.9  --   --   CL 95*  --   --   CO2 26  --   --   GLUCOSE 192*  --   --   BUN 42*  --   --   CREATININE 0.59  --   --   CALCIUM 9.3  --   --    GFR: Estimated Creatinine Clearance: 56 mL/min (by C-G formula based on SCr of 0.59 mg/dL). Liver Function Tests: Recent Labs  Lab 08/09/20 1042  AST 33  ALT 89*  ALKPHOS 63  BILITOT 0.5  PROT 7.3  ALBUMIN 3.0*   No results for input(s): LIPASE, AMYLASE in the last 168 hours. No results for input(s): AMMONIA in the last 168 hours. Coagulation Profile: No results for input(s): INR, PROTIME in the last 168 hours. Cardiac Enzymes: No results for input(s): CKTOTAL, CKMB, CKMBINDEX, TROPONINI in the last 168 hours. BNP (last 3 results) No results for input(s): PROBNP in the last 8760 hours. HbA1C: No results for input(s): HGBA1C in the last 72 hours. CBG: Recent Labs  Lab 08/12/20 1150 08/12/20 1630 08/12/20 2038 08/13/20 0032 08/13/20 0349  GLUCAP  141* 134* 187* 183* 161*   Lipid Profile: No results for input(s): CHOL, HDL, LDLCALC, TRIG, CHOLHDL, LDLDIRECT in the last 72 hours. Thyroid Function Tests: No results for input(s): TSH, T4TOTAL, FREET4, T3FREE, THYROIDAB in the last 72 hours. Anemia Panel: No results for input(s): VITAMINB12, FOLATE, FERRITIN, TIBC, IRON, RETICCTPCT in the last 72 hours. Sepsis Labs: No results for input(s): PROCALCITON, LATICACIDVEN in the last 168 hours.  No results found for this or any previous visit (from the past 240 hour(s)).       Radiology Studies: No results found.      Scheduled Meds:  Chlorhexidine Gluconate Cloth  6 each Topical Daily   docusate  100 mg Per Tube BID   enoxaparin (LOVENOX) injection  40 mg Subcutaneous Q24H   feeding supplement (KATE FARMS STANDARD 1.4)  325 mL Oral Daily   Ferrous Fumarate  1 tablet Oral BID   free water  20 mL Per Tube Q6H   insulin aspart  0-9 Units Subcutaneous Q4H   lactulose  20 g  Oral BID   liver oil-zinc oxide   Topical BID   melatonin  5 mg Oral QHS   metoprolol tartrate  12.5 mg Per Tube BID   modafinil  100 mg Oral Daily   multivitamin  15 mL Per Tube Daily   pantoprazole sodium  40 mg Per Tube Daily   rosuvastatin  20 mg Oral Daily   sodium chloride flush  3 mL Intravenous Q12H   sodium chloride  1 g Oral BID WC   tamsulosin  0.4 mg Oral QPC supper   thiamine  100 mg Oral Daily   Continuous Infusions:  sodium chloride 10 mL/hr at 07/30/20 0400   feeding supplement (VITAL AF 1.2 CAL) 1,000 mL (08/13/20 0456)    Assessment & Plan:   Active Problems:   SIRS (systemic inflammatory response syndrome) (HCC)   Shingles rash   Delirium   Acute metabolic encephalopathy   Essential hypertension   Anemia   Acute lower UTI   Chronic diastolic CHF (congestive heart failure) (HCC)   Sepsis (HCC)   Change in mental status   Anorexia   Malnutrition of moderate degree   Acute metabolic encephalopathy: etiology unclear, meningitis vs delirium. Mental status is labile. Mental status is worse today. S/p LP on 9/14. MRA/MRI head no acute intracranial abnormalities. Abxs were d/c. EEGs were neg for seizures or epileptiform discharges. CSF neg for HSV, West Nile, autoimmune encephalitis, enterovirus. 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  Completed acyclovir 21 day course. Pt's daughter refused provigil which was initially started by neuro  Per neurology patient's altered mental status is combination of metabolic/infectious encephalopathy (varicella associated encephalitis) /hyponatremia and hospital delirium.  As often elderly patient's cognition may lag behind significant improvement even though medical issues are adequately addressed  10/3-need to keep Na level within normal limits as this also has been playing a role in her MS change. 10/4- more awake and interactive (still not at baseline). 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   Nephrology initially consulted. 10/2- Na level stable at 135  We will continue with fluid restriction and salt tablets  Monitor levels closely  10/3-no labs today. Will ck periodically. Giving pt break from labs draws.  10/4-sodium level 140 today.  We will continue on salt tablets  Abdominal pain/distention- obtained repeat abd. xr earlier since pt had BM. abd xr this am revealed no obstruction or ileus.  10/2-No indication the patient has pain today.  She did have a bowel movement previously which helped. Repeat abdominal x-ray revealed stabilization of tube feeding and she is currently tolerating it. 10/3 asx currently. Tolerating TF, will continue to monitor. 10/4 already tube feeding will continue.  No further symptoms of abdominal pain     Shingles: w/ aseptic meningitis/encephalitis. 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  We will continue with tube feeding which initially was started on 07/23/2020.   P.o. intake still poor especially with her altered mental status.   Will need PEG tube placement especially going to SNF at some point as they will not take her with NG tube feeding.  Family does not want PEG to be placed here, prefer going to [facility], although aware that we cannot transfer the patient to [facility] just for PEG tube placement.  She will need PEG tube placement prior to going to SNF as they will not take her onto NG tube feeding Family still deciding on this 10/4-tolerating tube feeding.  Still no decision has been made about PEG tube placement.  We will have another the discuss another discussion with  family     Thrombocytosis: Reactive. Continue to monitor   Sepsis: Resolved  Intra-abdominal fluid collection: w/ CT showing fluid collection likely pseudocyst, seroma or biloma, not abscess. IR did not recommend aspiration    Urinary retention:  Foley was discontinued initially however patient was retaining urine and bladder scan revealed more than 300 each time at 1 time over 600.  Foley was replaced again.   We will try to do more voiding trial in the near future once she her mental status continues to improve.    UTI: completed abx course. Resolved  Chronic diastolic CHF: Without acute exacerbation.   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 Will need PEG inorder to go to SNF.pallaitive consulted pending            LOS: 31 days   Time spent: 35 min with >50% on coc    Lynn Ito, MD [facility] Pager [phone]  If 7PM-7AM, please contact night-coverage [email or web address] Password [email or web address] 08/13/2020, 8:11 AM

## 2020-08-13 NOTE — Evaluation (Signed)
Physical Therapy Re-Evaluation Patient Details Name: Casey Baldwin MRN: [id number] DOB: December 03, 1940 Today's Date: 08/13/2020   History of Present Illness  Casey Baldwin is a 79 y.o. 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  Presented to ER secondary to generalized body aches, progressive weakness, headaches; admitted for management of SIRS (urine?), shingles and acute metabolic encephalopathy.  Clinical Impression  PT re-evaluation performed d/t pt's extended hospitalization.  Prior to hospital admission, pt was ambulatory.  Currently pt is 2 assist with bed mobility and pt able to briefly sit unsupported edge of bed (with assist for positioning) but pt tending to lean backwards requiring assist for sitting balance.  Pt inconsistent with 1 step commands; able to verbalize some during session (in shorter sentences).  During bed mobility and LE movement pt would yell out in pain at times but once these movements stopped, pt appearing more comfortable.  Pt would benefit from skilled PT to address noted impairments and functional limitations (see below for any additional details).  Upon hospital discharge, pt would benefit from STR.  POC reviewed and updated as appropriate.    Follow Up Recommendations SNF    Equipment Recommendations  Rolling walker with 5" wheels;3in1 (PT);Wheelchair (measurements PT);Wheelchair cushion (measurements PT)    Recommendations for Other Services OT consult     Precautions / Restrictions Precautions Precautions: Fall Restrictions Weight Bearing Restrictions: No Other Position/Activity Restrictions: NG tube; HOB >30 degrees; aspiration; prevalon boots      Mobility  Bed Mobility Overal bed mobility: Needs Assistance Bed Mobility: Supine to Sit;Sit to Supine     Supine to sit: Max assist;+2 for physical assistance;HOB elevated Sit to supine: Max assist;+2 for physical  assistance;HOB elevated   General bed mobility comments: assist for trunk and B LE's semi-supine to/from sitting edge of bed  Transfers                 General transfer comment: Deferred  Ambulation/Gait             General Gait Details: Deferred  Stairs            Wheelchair Mobility    Modified Rankin (Stroke Patients Only)       Balance Overall balance assessment: Needs assistance Sitting-balance support: Bilateral upper extremity supported;Feet supported Sitting balance-Leahy Scale: Poor Sitting balance - Comments: able to briefly sit unsupported edge of bed a few times once positioned with B feet on floor and either B hands on bed or on pt's knees (pt tending to lean backwards requiring assist for sitting balance) Postural control: Posterior lean     Standing balance comment: Deferred                             Pertinent Vitals/Pain      Home Living Family/patient expects to be discharged to:: Skilled nursing facility Living Arrangements: Alone (daughter had been staying with pt since GB surgery but works full time) Available Help at Discharge: Family;Personal care attendant Type of Home: House Home Access: Stairs to enter     Home Layout: One level Home Equipment: Environmental consultant - 2 wheels;Bedside commode;Grab bars - toilet;Grab bars - tub/shower Additional Comments: Above home living information per prior PT eval (pt unable to verbalize this information).    Prior Function Level of Independence: Independent with assistive device(s)         Comments: Per prior OT  note "PCA 6hrs/day supervises bathing / assists c IADLs and a "maid" for cleaning. Enjoys hobbies of sewing/drawing".     Hand Dominance   Dominant Hand: Right    Extremity/Trunk Assessment   Upper Extremity Assessment RUE Deficits / Details: AAROM B shoulder flexion and elbow flexion/extension with multiple vc's; poor to fair B hand grip strength with cueing     Lower Extremity Assessment Lower Extremity Assessment:  (able to wiggle toes B with cueing; B DF to neutral PROM; B hip and knee flexion/extension PROM [facility]) RLE Deficits / Details: 2/5 grossly LLE Deficits / Details: at least 3-/5 L knee extension AROM in sitting; grossly 3-/5    Cervical / Trunk Assessment Cervical / Trunk Assessment: Normal  Communication   Communication: HOH  Cognition Arousal/Alertness: Awake/alert (pt sleeping initially and appearing lethargic initially once woken but improved alertness noted with activity) Behavior During Therapy: Flat affect Overall Cognitive Status: No family/caregiver present to determine baseline cognitive functioning Area of Impairment: Awareness;Problem solving;Following commands;Safety/judgement                       Following Commands: Follows one step commands inconsistently Safety/Judgement: Decreased awareness of safety;Decreased awareness of deficits Awareness: Intellectual Problem Solving: Slow processing;Decreased initiation;Difficulty sequencing;Requires verbal cues;Requires tactile cues        General Comments General comments (skin integrity, edema, etc.): NG tube in place.  Nursing cleared pt for participation in physical therapy.    Exercises  x3 AAROM R LE LAQ's and x5 AAROM L LE LAQ's (sitting edge of bed) Pt requiring vc's and tactile cues to attempt to reach forward with each hand x1 rep in sitting   Assessment/Plan    PT Assessment Patient needs continued PT services  PT Problem List Decreased strength;Decreased activity tolerance;Decreased balance;Decreased mobility;Decreased cognition;Decreased knowledge of use of DME;Decreased safety awareness;Decreased knowledge of precautions;Decreased range of motion;Pain       PT Treatment Interventions DME instruction;Gait training;Functional mobility training;Therapeutic activities;Therapeutic exercise;Balance training;Patient/family education    PT Goals  (Current goals can be found in the Care Plan section)  Acute Rehab PT Goals Patient Stated Goal: none stated PT Goal Formulation: With patient Time For Goal Achievement: 08/27/20 Potential to Achieve Goals: Fair    Frequency Min 2X/week   Barriers to discharge Decreased caregiver support      Co-evaluation               AM-PAC PT "6 Clicks" Mobility  Outcome Measure Help needed turning from your back to your side while in a flat bed without using bedrails?: Total Help needed moving from lying on your back to sitting on the side of a flat bed without using bedrails?: Total Help needed moving to and from a bed to a chair (including a wheelchair)?: Total Help needed standing up from a chair using your arms (e.g., wheelchair or bedside chair)?: Total Help needed to walk in hospital room?: Total Help needed climbing 3-5 steps with a railing? : Total 6 Click Score: 6    End of Session Equipment Utilized During Treatment: Oxygen Activity Tolerance: Patient limited by fatigue Patient left: in bed;with call bell/phone within reach;with bed alarm set;Other (comment) (B prevalon boots in place; B UE's elevated via pillows) Nurse Communication: Precautions PT Visit Diagnosis: Muscle weakness (generalized) (M62.81);Difficulty in walking, not elsewhere classified (R26.2);Other abnormalities of gait and mobility (R26.89)    Time: 3419-3790 PT Time Calculation (min) (ACUTE ONLY): 42 min   Charges:   PT Evaluation $PT Re-evaluation:  1 Re-eval PT Treatments $Therapeutic Activity: 23-37 mins       Hendricks Limes, PT 08/13/20, 5:20 PM

## 2020-08-13 NOTE — Progress Notes (Signed)
ID Pt in bed Somnolent PT made her sit on the edge of the bed with support She responded to some commands She verbalized in short sentence Patient Vitals for the past 24 hrs:  BP Temp Temp src Pulse Resp SpO2 Weight  08/13/20 1602 (!) 141/79 98 F (36.7 C) Oral (!) 108 17 99 % --  08/13/20 1300 -- -- -- -- -- 99 % --  08/13/20 0754 135/68 97.9 F (36.6 C) Axillary 92 20 100 % --  08/13/20 0500 -- -- -- -- -- -- 76.2 kg  08/12/20 2358 132/68 98.5 F (36.9 C) Oral 96 18 99 % --     O/e  letahrgic repsonds to some commands and questions Fluctuates NG tube Chest b/la ir entry [id number] abd soft distension, Hernia rt side Foley Cns - moves upper extremities on commands Lower extremity tight because of immobility Labs CBC Latest Ref Rng & Units 08/10/2020 08/09/2020 08/06/2020  WBC 4.0 - 10.5 K/uL 12.8(H) 14.5(H) 11.2(H)  Hemoglobin 12.0 - 15.0 g/dL 2.9(V) 10.6(L) 10.3(L)  Hematocrit 36 - 46 % 28.6(L) 30.8(L) 28.8(L)  Platelets 150 - 400 K/uL 346 413(H) 403(H)    CMP Latest Ref Rng & Units 08/13/2020 08/11/2020 08/10/2020  Glucose 70 - 99 mg/dL - - -  BUN 8 - 23 mg/dL - - -  Creatinine 7.47 - 1.00 mg/dL - - -  Sodium 340 - 370 mmol/L 140 135 132(L)  Potassium 3.5 - 5.1 mmol/L - - -  Chloride 98 - 111 mmol/L - - -  CO2 22 - 32 mmol/L - - -  Calcium 8.9 - 10.3 mg/dL - - -  Total Protein 6.5 - 8.1 g/dL - - -  Total Bilirubin 0.3 - 1.2 mg/dL - - -  Alkaline Phos 38 - 126 U/L - - -  AST 15 - 41 U/L - - -  ALT 0 - 44 U/L - - -   ID  CSF- HSV, enterovirus neg VZV pcrNegative VzV igG > 4000 WNV IgG positive in csf, IgM neg Serum WNV IgG and IgM neg Lyme NG Nmdar/autoimmune panel neg  CSF culture NG   BC -9/13, 9/6, 9/2 negative  Imaging MRI/MRA 9/14- negative MRI with contrast 9/2 negative   EEG -9/10 , 9/15 and 9/17 - no seizures or epileptiform discharges  Impression/recommendation  Hypoactive delirium -more interactive today  Cause multifactorial-  hyponatremia, constipation Na has also improved to 134> 132 She needs constant stimulation and activity Need PT/OT/speech, OOB, breathing exercise,Regular bowel movts Appreciate speech : their recommendation  cognitive rehab at [facility]  Admitted on 9/12 with chest pain rt side, fever, headache and leg painandintermittent confusion  Found to have herpes zoster Thoracic dermatome  Herpes zoster with aseptic meningitis on admission ( patient had refused LPinitially)- was started on IV acyclovir 10mg /Kg q 8. She was alert and verbal and appropriate on presentation but after 5-6 days gradual deterioration to obtundation. Seen by neuro- delirium was questioned -EEG on 07/20/20 no seizure activity..There were many reasons including changes in sleep rhythm, poor intake, constipation, not wearing CPAp ( but no co2 narcosis) hyponatremia for the obtundation On 9/14 Had LP which revealed lymphocytic pleocytosis bacterial culture neg and HSV DNA neg- VZVneg .VzV ig G very high west Nile virus IgG in csf was positive but not IgM and serum antibodies neg- so this isless likelyacute WNV and IgG is not a reliable test for diagnosis of acute infection as there is cross reactivity with other flavi virus and dengue-There is  no treatment for WNV.  MRI and MRA done on 9/14 no acute changes. Daughter wanted her to be in [place], but no bed andapparently [facility] has declined [facility] transfer Repeat EEG done on 07/25/20 no seizure activity noted. 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 and she was taken to [facility] 04/05/20 for diagnostic lap, robotic lysis of adhesions, transgastric ERCP and biliary sphincterotomy, cholecystectomy, partial gastrectomy andaccidentalcolotomy which was repaired.The immediate post op was complicated by post ERCP pancreatitis , pulmonary edema with acute hypoxic resp failure , Afib ( treated with amio and metoprolol), fever and leucocytosis suspected due to pancreatitis and was treated with zosyn, UTI due to proteus and acute on chronic anemia needing PRBC and AKI.She was seen by gerontologist for insomnia and risk for delirium   H/o left TKA  Hearing loss  Discussed the management withher daughter

## 2020-08-14 DIAGNOSIS — R079 Chest pain, unspecified: Secondary | ICD-10-CM | POA: Diagnosis not present

## 2020-08-14 DIAGNOSIS — B029 Zoster without complications: Secondary | ICD-10-CM | POA: Diagnosis not present

## 2020-08-14 DIAGNOSIS — R410 Disorientation, unspecified: Secondary | ICD-10-CM | POA: Diagnosis not present

## 2020-08-14 DIAGNOSIS — R339 Retention of urine, unspecified: Secondary | ICD-10-CM | POA: Diagnosis not present

## 2020-08-14 LAB — GLUCOSE, CAPILLARY
Glucose-Capillary: 131 mg/dL — ABNORMAL HIGH (ref 70–99)
Glucose-Capillary: 143 mg/dL — ABNORMAL HIGH (ref 70–99)
Glucose-Capillary: 144 mg/dL — ABNORMAL HIGH (ref 70–99)
Glucose-Capillary: 155 mg/dL — ABNORMAL HIGH (ref 70–99)
Glucose-Capillary: 159 mg/dL — ABNORMAL HIGH (ref 70–99)
Glucose-Capillary: 179 mg/dL — ABNORMAL HIGH (ref 70–99)

## 2020-08-14 LAB — SODIUM: Sodium: 139 mmol/L (ref 135–145)

## 2020-08-14 LAB — CULTURE, FUNGUS WITHOUT SMEAR: Special Requests: NORMAL

## 2020-08-14 MED ORDER — DOCUSATE SODIUM 50 MG/5ML PO LIQD
100.0000 mg | Freq: Every day | ORAL | Status: DC
  Administered 2020-08-15 – 2020-08-18 (×4): 100 mg
  Filled 2020-08-14 (×4): qty 10

## 2020-08-14 MED ORDER — VITAL AF 1.2 CAL PO LIQD
1000.0000 mL | ORAL | Status: DC
  Administered 2020-08-15: 1000 mL

## 2020-08-14 MED ORDER — KATE FARMS STANDARD 1.4 PO LIQD
325.0000 mL | Freq: Two times a day (BID) | ORAL | Status: DC
  Administered 2020-08-15 – 2020-08-18 (×5): 325 mL via ORAL
  Filled 2020-08-14: qty 325

## 2020-08-14 NOTE — Plan of Care (Signed)
  Problem: Education: Goal: Knowledge of General Education information will improve Description: Including pain rating scale, medication(s)/side effects and non-pharmacologic comfort measures Outcome: Progressing   Problem: Health Behavior/Discharge Planning: Goal: Ability to manage health-related needs will improve Outcome: Progressing   Problem: Clinical Measurements: Goal: Ability to maintain clinical measurements within normal limits will improve 08/14/2020 0555 by Garwin Brothers, RN Outcome: Progressing 08/14/2020 0555 by Garwin Brothers, RN Outcome: Progressing Goal: Will remain free from infection 08/14/2020 0555 by Garwin Brothers, RN Outcome: Progressing 08/14/2020 0555 by Garwin Brothers, RN Outcome: Progressing Goal: Diagnostic test results will improve 08/14/2020 0555 by Garwin Brothers, RN Outcome: Progressing 08/14/2020 0555 by Garwin Brothers, RN Outcome: Progressing Goal: Respiratory complications will improve 08/14/2020 0555 by Garwin Brothers, RN Outcome: Progressing 08/14/2020 0555 by Garwin Brothers, RN Outcome: Progressing Goal: Cardiovascular complication will be avoided 08/14/2020 0555 by Garwin Brothers, RN Outcome: Progressing 08/14/2020 0555 by Garwin Brothers, RN Outcome: Progressing   Problem: Activity: Goal: Risk for activity intolerance will decrease Outcome: Progressing   Problem: Nutrition: Goal: Adequate nutrition will be maintained 08/14/2020 0555 by Garwin Brothers, RN Outcome: Progressing 08/14/2020 0555 by Garwin Brothers, RN Outcome: Progressing   Problem: Coping: Goal: Level of anxiety will decrease Outcome: Progressing   Problem: Elimination: Goal: Will not experience complications related to bowel motility Outcome: Progressing Goal: Will not experience complications related to urinary retention Outcome: Progressing   Problem: Pain Managment: Goal: General experience of comfort will improve Outcome: Progressing   Problem:  Safety: Goal: Ability to remain free from injury will improve 08/14/2020 0555 by Garwin Brothers, RN Outcome: Progressing 08/14/2020 0555 by Garwin Brothers, RN Outcome: Progressing   Problem: Skin Integrity: Goal: Risk for impaired skin integrity will decrease 08/14/2020 0555 by Garwin Brothers, RN Outcome: Progressing 08/14/2020 0555 by Garwin Brothers, RN Outcome: Progressing

## 2020-08-14 NOTE — Progress Notes (Signed)
     Referral received for Celesta Gentile :goals of care discussion. Chart reviewed and updates received from RN. Patient assessed. She is alert and oriented to person and place. States her daughter went to work. Able to follow commands.   I was able to speak with patient's daughter/POA, Leward Quan. I introduced myself and Palliative's role in Mrs. Carmell Austria care. At daughter's request GOC meeting scheduled for 08/15/20 around 930-10am. Family is aware we will meet at patient's bedside.   Thank you for your referral and allowing PMT to assist in Mrs. Madelin Rear care.   Willette Alma, AGPCNP-BC Palliative Medicine Team  Phone: [phone] Pager: [phone] Amion: N. Cousar   NO CHARGE

## 2020-08-14 NOTE — Progress Notes (Signed)
Physical Therapy Treatment Patient Details Name: Casey Baldwin MRN: [id number] DOB: January 26, 1941 Today's Date: 08/14/2020    History of Present Illness Casey Baldwin is a 79 y.o. 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  Presented to ER secondary to generalized body aches, progressive weakness, headaches; admitted for management of SIRS (urine?), shingles and acute metabolic encephalopathy.    PT Comments    Per rounds, pt feeling better and requesting to walk.  Pt resting in bed upon PT arrival; alert and talkative intermittently during session; oriented to at least self (pt would not answer other orientation questions).  Pt vocalizing in pain with movement of B LE's during session but pt reporting no pain at rest.  2 assist required for bed mobility.  Able to sit edge of bed (with assist for positioning) with assist varying from mod assist, to min assist, to close SBA (for short periods of time) with 2nd assist for safety.  Unable to stand pt (or clear pt's bottom from bed attempting to stand) with 2 assist, B knees blocked, and walker use (and bed height elevated).  Will continue to focus on strengthening and progressive functional mobility per pt tolerance.    Follow Up Recommendations  SNF     Equipment Recommendations  Rolling walker with 5" wheels;3in1 (PT);Wheelchair (measurements PT);Wheelchair cushion (measurements PT);Other (comment) (hoyer lift)    Recommendations for Other Services OT consult     Precautions / Restrictions Precautions Precautions: Fall Restrictions Weight Bearing Restrictions: No Other Position/Activity Restrictions: NG tube; HOB >30 degrees; aspiration; prevalon boots    Mobility  Bed Mobility Overal bed mobility: Needs Assistance Bed Mobility: Supine to Sit;Sit to Supine     Supine to sit: Max assist;+2 for physical assistance;HOB elevated Sit to supine: Max  assist;+2 for physical assistance;HOB elevated   General bed mobility comments: assist for trunk and B LE's semi-supine to/from sitting edge of bed  Transfers Overall transfer level: Needs assistance Equipment used: Rolling walker (2 wheeled) Transfers: Sit to/from Stand Sit to Stand: +2 physical assistance;From elevated surface         General transfer comment: unable to stand with 2 assist, walker use, and B knees blocked  Ambulation/Gait             General Gait Details: unable to stand to attempt at this time   Stairs             Wheelchair Mobility    Modified Rankin (Stroke Patients Only)       Balance Overall balance assessment: Needs assistance Sitting-balance support: Bilateral upper extremity supported;Feet supported Sitting balance-Leahy Scale: Poor Sitting balance - Comments: pt able to sit for short periods of times with B UE support but other times required min to mod assist for sitting balance d/t posterior lean Postural control: Posterior lean                                  Cognition Arousal/Alertness: Awake/alert Behavior During Therapy: Flat affect Overall Cognitive Status: No family/caregiver present to determine baseline cognitive functioning Area of Impairment: Awareness;Problem solving;Following commands;Safety/judgement;Attention;Memory;Orientation                 Orientation Level:  (Oriented to self and month/day of birth (but not year); pt did not answer any other orientation question) Current Attention Level: Selective;Alternating Memory: Decreased recall of precautions;Decreased short-term memory  Following Commands: Follows one step commands inconsistently Safety/Judgement: Decreased awareness of safety;Decreased awareness of deficits Awareness: Intellectual Problem Solving: Slow processing;Decreased initiation;Difficulty sequencing;Requires verbal cues;Requires tactile cues General Comments: pt more  talkative with therapist at times (other times pt not initiating and participating in conversation)      Exercises      General Comments General comments (skin integrity, edema, etc.): NG tube in place.  Nursing cleared pt for participation in physical therapy.  Pt agreeable to PT session but appearing fearful with activities at times.      Pertinent Vitals/Pain Pain Assessment: Faces Faces Pain Scale: No hurt (pt yelling out in pain with movement of B LE's but reporting none at rest beginning/end of session) Pain Location: B LE's Pain Descriptors / Indicators: Grimacing (yelling out with movement of B LE's) Pain Intervention(s): Limited activity within patient's tolerance;Monitored during session;Repositioned  Vitals (HR and O2 on room air) stable and WFL throughout treatment session.    Home Living                      Prior Function            PT Goals (current goals can now be found in the care plan section) Acute Rehab PT Goals Patient Stated Goal: none stated PT Goal Formulation: With patient Time For Goal Achievement: 08/27/20 Potential to Achieve Goals: Fair Progress towards PT goals: Progressing toward goals    Frequency    Min 2X/week      PT Plan Current plan remains appropriate    Co-evaluation              AM-PAC PT "6 Clicks" Mobility   Outcome Measure  Help needed turning from your back to your side while in a flat bed without using bedrails?: Total Help needed moving from lying on your back to sitting on the side of a flat bed without using bedrails?: Total Help needed moving to and from a bed to a chair (including a wheelchair)?: Total Help needed standing up from a chair using your arms (e.g., wheelchair or bedside chair)?: Total Help needed to walk in hospital room?: Total Help needed climbing 3-5 steps with a railing? : Total 6 Click Score: 6    End of Session Equipment Utilized During Treatment: Oxygen;Gait belt Activity  Tolerance: Patient limited by fatigue Patient left: in bed;with call bell/phone within reach;with bed alarm set;Other (comment) (B prevalon boots in place) Nurse Communication: Mobility status;Precautions PT Visit Diagnosis: Muscle weakness (generalized) (M62.81);Difficulty in walking, not elsewhere classified (R26.2);Other abnormalities of gait and mobility (R26.89)     Time: 1322-1400 PT Time Calculation (min) (ACUTE ONLY): 38 min  Charges:  $Therapeutic Activity: 38-52 mins                    Hendricks Limes, PT 08/14/20, 5:02 PM

## 2020-08-14 NOTE — Progress Notes (Signed)
Occupational Therapy Treatment Patient Details Name: Casey Baldwin MRN: [id number] DOB: Feb 10, 1941 Today's Date: 08/14/2020    History of present illness Casey Baldwin is a 79 y.o. 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  Presented to ER secondary to generalized body aches, progressive weakness, headaches; admitted for management of SIRS (urine?), shingles and acute metabolic encephalopathy.   OT comments  Pt seen for OT tx this date. Pt wakes easily to verbal cues, engages in "chit chat" with therapist, reports appreciation for therapist. Pt participates in bed level grooming tasks to promote increased independence with meaningful daily routines. Pt required verbal cues and tactile cue to initiate with pt able to then wash approx 75% of her face with occasional cues to continue; requiring Min A to complete washing; max A for applying lotion to hands. Pt expressed appreciation. Pt progressing slowly towards goals. Cognition this date appears improved from previous sessions with this therapist. Continue to recommend SNF at this time given significant impairments and subsequent significant functional deficits to ADL and mobility. Will continue to progress towards OT goals.     Follow Up Recommendations  SNF    Equipment Recommendations  None recommended by OT    Recommendations for Other Services      Precautions / Restrictions Precautions Precautions: Fall Restrictions Weight Bearing Restrictions: No Other Position/Activity Restrictions: NG tube; HOB >30 degrees; aspiration; prevalon boots       Mobility Bed Mobility                  Transfers                      Balance                                           ADL either performed or assessed with clinical judgement   ADL Overall ADL's : Needs assistance/impaired     Grooming: Minimal  assistance;Bed level;Wash/dry face;Wash/dry hands Grooming Details (indicate cue type and reason): verbal cues and tactile cue to initiate with pt able to then wash approx 75% of her face with occasional cues to continue; requiring Min A to complete washing; max A for applying lotion to hands                                     Vision       Perception     Praxis      Cognition Arousal/Alertness: Awake/alert Behavior During Therapy: WFL for tasks assessed/performed Overall Cognitive Status: No family/caregiver present to determine baseline cognitive functioning                                 General Comments: more alert and conversational this date, engages in "chit chat" with therapist, cues to initiate and continue simple grooming tasks        Exercises     Shoulder Instructions       General Comments      Pertinent Vitals/ Pain       Pain Assessment: Faces Faces Pain Scale: Hurts a little bit Pain Location: back, legs Pain Descriptors / Indicators: Aching Pain Intervention(s): Limited activity within  patient's tolerance;Monitored during session  Home Living                                          Prior Functioning/Environment              Frequency  Min 1X/week        Progress Toward Goals  OT Goals(current goals can now be found in the care plan section)  Progress towards OT goals: Progressing toward goals  Acute Rehab OT Goals Patient Stated Goal: none stated OT Goal Formulation: Patient unable to participate in goal setting Time For Goal Achievement: 08/22/20 Potential to Achieve Goals: Fair  Plan Frequency remains appropriate;Discharge plan remains appropriate    Co-evaluation                 AM-PAC OT "6 Clicks" Daily Activity     Outcome Measure   Help from another person eating meals?: A Lot Help from another person taking care of personal grooming?: A Lot Help from another  person toileting, which includes using toliet, bedpan, or urinal?: Total Help from another person bathing (including washing, rinsing, drying)?: Total Help from another person to put on and taking off regular upper body clothing?: Total Help from another person to put on and taking off regular lower body clothing?: Total 6 Click Score: 8    End of Session    OT Visit Diagnosis: Other abnormalities of gait and mobility (R26.89)   Activity Tolerance Patient tolerated treatment well   Patient Left in bed;with call bell/phone within reach;with bed alarm set;with nursing/sitter in room;Other (comment) (prevalon boots in place, NG tube in place, HOB >30deg)   Nurse Communication          Time: 7673-4193 OT Time Calculation (min): 9 min  Charges: OT General Charges $OT Visit: 1 Visit OT Treatments $Self Care/Home Management : 8-22 mins  Richrd Prime, MPH, MS, OTR/L ascom [phone] 08/14/20, 11:58 AM

## 2020-08-14 NOTE — Progress Notes (Signed)
Nutrition Follow-up  DOCUMENTATION CODES:   Non-severe (moderate) malnutrition in context of chronic illness  INTERVENTION:  Tube feed regimen has been decreased to 1/2 rate: -Vital AF 1.2 Cal at 30 mL/hr (720 mL goal daily volume) -Provides 864 kcal (51% minimum estimated kcal needs), 54 grams of protein (60% minimum estimated protein needs), 605 mL H2O daily -Continue free water flush of 20 mL Q6hrs to maintain tube patency  Increased to [employer] Standard 1.4 Vanilla po BID, each supplement provides 455 kcal and 20 grams of protein.  Initiate 48 hour calorie count. RD will document results. Plan is to increase tube feeds back if patient is not eating adequately.  NUTRITION DIAGNOSIS:   Moderate Malnutrition related to chronic illness (cholecystitis s/p LOA, transgastric ERCP and biliary sphincterotomy, cholecystectomy, partial gastrectomy, and colotomy 5/27, hx Roux-en-Y gastric bypass, CHF) as evidenced by mild fat depletion, mild muscle depletion, moderate muscle depletion, percent weight loss.  Ongoing.  GOAL:   Patient will meet greater than or equal to 90% of their needs  Previously met with TF regimen; will monitor progressing with PO intake.  MONITOR:   PO intake, Labs, Weight trends, TF tolerance, I & O's  REASON FOR ASSESSMENT:   Consult Enteral/tube feeding initiation and management  ASSESSMENT:   79 y/o female with h/o CHF, HLD, HTN, OSA, DM, Roux-en-y 2010, Lap band 2008, cholecystitis s/p (diagnostic laparoscopy, robotic lysis of adhesions, transgastric ERCP and biliary sphincterotomy, cholecystectomy, partial gastrectomy and colotomy 5/27) and colon resection for diverticulitis who is admitted with UTI, SIRS, shingles and encephalitis  9/10 per IR no indication for drain placement for stable pancreatic pseudocyst 9/11 NGT placed but plan was to try to adv and wait until AM for repeat x-ray 9/12 TFs started at 20 mL/hr but tube later dislodged; tube was  advanced, repeat x-ray taken and TFs resumed at 20 mL/hr 9/12 feeding tube became clogged 9/13 s/p placement of 10 Fr. Dobbhoff tube under fluoroscopic guidance that terminates in proximal portion of gastric remnant (could not be advanced further) 9/14 s/p LP 9/16 s/p EEG with findings suggestive of moderate diffuse encephalopathy 9/17 s/p EEG with findings suggestive of moderate diffuse encephalopathy  Met with patient at bedside. She is much more alert today. She was asking RD what word on bottom of badge was and started reading badge. Patient reports her appetite is improving and she is eating at meals. Per RN patient ate well at breakfast this morning. Lunch tray present at bedside. Patient had only taken bites of mashed potatoes with gravy but did eat 100% of an orange Magic Cup and 100% of an apple juice. Patient endorses she likes [employer] She had only taken sips of the one currently present at bedside.   Noted TF rate has been decreased to 30 mL/hr. Messaged MD via secure chat. Daughter had requested rate be decreased now that patient is eating better. Plan is to complete 48 hours calorie count.  Enteral Access: 10 Fr. DHT placed in IR on 9/13; per abdominal x-ray 10/1 tip is in gastric cardia  TF regimen: Vital AF 1.2 Cal now reduced to 30 mL/hr + free water flush 20 mL Q6hrs  Medications reviewed and include: Colace 100 mg daily, ferrous fumarate 1 tablet BID, Novolog 0-9 units Q4hrs, lactulose 20 grams BID, liquid MVI daily per tube, Protonix, Flomax, thiamine 100 mg daily.  Labs reviewed: CBG 155-262, Sodium 139.  Discussed with RN.  Diet Order:   Diet Order  DIET - DYS 1 Room service appropriate? Yes with Assist; Fluid consistency: Thin  Diet effective now                EDUCATION NEEDS:   No education needs have been identified at this time  Skin:  Skin Assessment: Reviewed RN Assessment (ecchymosis)  Last BM:  9/26-type 6  Height:   Ht  Readings from Last 1 Encounters:  07/12/20 '5\' 4"'  (1.626 m)   Weight:   Wt Readings from Last 1 Encounters:  08/14/20 75.7 kg   Ideal Body Weight:  54.5 kg  BMI:  Body mass index is 28.65 kg/m.  Estimated Nutritional Needs:   Kcal:  1700-1900kcal/day  Protein:  90-105 grams  Fluid:  1.6L/day  Jacklynn Barnacle, MS, RD, LDN Pager number available on Amion

## 2020-08-14 NOTE — Progress Notes (Signed)
Date of Admission:  07/12/2020    Daughter at bedside  ID: Casey Baldwin is a 79 y.o. female  Active Problems:   SIRS (systemic inflammatory response syndrome) (HCC)   Shingles rash   Delirium   Acute metabolic encephalopathy   Essential hypertension   Anemia   Acute lower UTI   Chronic diastolic CHF (congestive heart failure) (HCC)   Sepsis (HCC)   Change in mental status   Anorexia   Malnutrition of moderate degree    Subjective: none  Medications:  . Chlorhexidine Gluconate Cloth  6 each Topical Daily  . [START ON 08/15/2020] docusate  100 mg Per Tube Daily  . enoxaparin (LOVENOX) injection  40 mg Subcutaneous Q24H  . feeding supplement (KATE FARMS STANDARD 1.4)  325 mL Oral Daily  . Ferrous Fumarate  1 tablet Oral BID  . free water  20 mL Per Tube Q6H  . insulin aspart  0-9 Units Subcutaneous Q4H  . lactulose  20 g Oral BID  . liver oil-zinc oxide   Topical BID  . melatonin  5 mg Oral QHS  . metoprolol tartrate  12.5 mg Per Tube BID  . modafinil  100 mg Oral Daily  . multivitamin  15 mL Per Tube Daily  . pantoprazole sodium  40 mg Per Tube Daily  . rosuvastatin  20 mg Oral Daily  . sodium chloride flush  3 mL Intravenous Q12H  . sodium chloride  1 g Oral BID WC  . tamsulosin  0.4 mg Oral QPC supper  . thiamine  100 mg Oral Daily    Objective: Vital signs in last 24 hours: Temp:  [97.9 F (36.6 C)-99.9 F (37.7 C)] 97.9 F (36.6 C) (10/05 0800) Pulse Rate:  [86-108] 86 (10/05 0800) Resp:  [16-17] 16 (10/05 0800) BP: (140-141)/(63-79) 141/69 (10/05 0800) SpO2:  [97 %-99 %] 99 % (10/05 0800) Weight:  [75.7 kg] 75.7 kg (10/05 0500)  PHYSICAL EXAM:  General: more Alert, cooperative, no distress, respods to some questions, follows commands better Head: Normocephalic, without obvious abnormality, atraumatic. Eyes: Conjunctivae clear, anicteric sclerae. Pupils are equal ENT Ng tube Neck: stiff- Lungs: b/l air entry Heart: Regular rate and rhythm, no murmur,  rub or gallop. Abdomen: Soft, abdominal wall hernia Extremities: atraumatic, no cyanosis. No edema. No clubbing Skin: No rashes or lesions. Or bruising Lymph: Cervical, supraclavicular normal. Neurologic: moves upper extremities well Lower extremities tight Lab Results Recent Labs    08/13/20 0920 08/14/20 0912  NA 140 139   Liver Panel No results for input(s): PROT, ALBUMIN, AST, ALT, ALKPHOS, BILITOT, BILIDIR, IBILI in the last 72 hours. Sedimentation Rate No results for input(s): ESRSEDRATE in the last 72 hours. C-Reactive Protein No results for input(s): CRP in the last 72 hours.  Microbiology:  Studies/Results: No results found.   Assessment/Plan: Hypoactive delirium -more interactive today Cause multifactorial- hyponatremia, constipation Sodium is optimized now She needs constant stimulation and activity Need PT/OT/speech, OOB, breathing exercise,Regular bowel movts, NA optimization Appreciate speech : their recommendation  cognitive rehab at [facility]  Admitted on 9/12 with chest pain rt side, fever, headache and leg painandintermittent confusion  Found to have herpes zoster Thoracic dermatome  Herpes zoster with aseptic meningitis on admission ( patient had refused LPinitially)- was started on IV acyclovir 10mg /Kg q 8. She was alert and verbal and appropriate on presentation but after 5-6 days gradual deterioration to obtundation. Seen by neuro- delirium was questioned -EEG on 07/20/20 no seizure activity..There were many reasons including changes  in sleep rhythm, poor intake, constipation, not wearing CPAp ( but no co2 narcosis) hyponatremia for the obtundation On 9/14 Had LP which revealed lymphocytic pleocytosis bacterial culture neg and HSV DNA neg- VZVneg .VzV ig G very high west Nile virus IgG in csf was positive but not IgM and serum antibodies neg- so this isless likelyacute WNV and IgG is not a reliable test for diagnosis of acute infection as  there is cross reactivity with other flavi virus and dengue-There is no treatment for WNV.  MRI and MRA done on 9/14 no acute changes. Daughter wanted her to be in [place], but no bed andapparently [facility] Repeat EEG done on 07/25/20 no seizure activity noted. 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 and she was taken to [facility]05[facility]20 for diagnostic lap, robotic lysis of adhesions, transgastric ERCP and biliary sphincterotomy, cholecystectomy, partial gastrectomy andaccidentalcolotomy which was repaired.The immediate post op was complicated by post ERCP pancreatitis , pulmonary edema with acute hypoxic resp failure , Afib ( treated with amio and metoprolol), fever and leucocytosis suspected due to pancreatitis and was treated with zosyn, UTI due to proteus and acute on chronic anemia needing PRBC and AKI.She was seen by gerontologist for insomnia and risk for delirium   H/o left TKA  Hearing loss   Discussed the management with her daughter

## 2020-08-14 NOTE — Progress Notes (Signed)
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 Rash is intensely pruritic different stages of healing some areas of vesicular be red border. Some areas covered in black eschars. Skin is diffusely tender.Per family (asked by me) pt developed confusion. Was c/o side to back pain.  Sed rate was elevated. LP was considered but patient declined stating that she had a history of meningitis in the past and very scared of having LP done. Hospitalist was called for admission for shingles and possible encephalitis.  9/8.IV acyclovir was changed to Valtrex. Patient has not had a bowel movement since admission, has poor appetite. Giving lactulose, increase the Protonix to twice a day.  9/9.Patient refused CPAP last night, she became more confused today. She also has reversed sleeping cycle. Added Seroquel 25 mg evening. Schedule lactulose twice a day for constipation.  9/10.Patient slept last night with Seroquel. Still very confused and fatigued.Seen by neurologist, suspect metabolic source. Repeated CT scan of the abdomen/pelvis, intra-abdominal fluid collection still present.  9/13.Dobbhoff placed, tube feeding started. Also consult psychiatry for 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  9/14.Patient mental status not improving. Had a fever of 102 yesterday evening. ID restart antibiotics with Rocephin and ampicillin. LP is performed today LP is performed.Study consistent with viral  meningitis/encephalitis. Discussed with infect disease and neurology, neurology concerned for subacute seizure. MRI of the brain and MRA will be performed. EEG is also ordered. However, patient will need a persistent EEG monitoring, initiate as process to transfer patient to a tertiary hospital. I have started the process with [facility] per family request. They do not want to transfer to [facility]. They are aware that the transfer process may take several days. Also startedseizure medicine per neurology. Discussed with DR. Veal from [facility] neurology, will arrange for transfer.  9/15: EEG: moderate diffuse encephlopahty, nonspecific. No seizure   9/17 more interactive today. EEG done.  9/19- pt more awake and alert, responding more to my questions.   9/21 I called [facility] transfer center, she is still on transfer list, but no beds available and told me they dont know when she will be transferred. Transfer was initiated last week by Dr. Chipper Herb Per families request. I updated the status to the daughter.  Hospital course from Dr. Wilfred Lacy 9/22-9/28/21: Pt's mental status has waxed and waned this week. No significant improvement. Pt completed the course of acyclovir. NG tube has been in over 2 weeks now and discussions have been w/ the daughter in regards to a PEG tube but pt's daughter wants to talk to the family before making that decision. Unsure if PEG tube would be an option, considering pt's extensive abd surg hx including gastric bypass which was initially done in 2008. [facility] declined the transfer on 08/06/20.   9/29 - daughter at bedside, upset and wants to know why [facility] did not take her mother for transfer. She also wants more explaination of her prognosis from neurologic stand point. Would like to get another neurologist opinion as she does not think pt has delirium/encphalopathy.  9/30-pt opens her eyes, more interactive. Tries to answer my questions , smiling and  waving hello to me.  10/1- Remains more awake, doesn't  Respond much to my questions. Overnight events noted, patient had abdominal distention, tube feeding was stopped and couple obtained revealing diffuse gas Deis distention of the colon likely colonic ileus no obstruction.  The tip of the enteric tube was in the esophagus.  10/2 abdominal xr -stable NGT feeding in place, TF was started yesterday.  10/3- opens eyes with sternal rub, but screamed at me with sternal rub.   10/4: eyes more open. Per nsg, she ate more this am.and actually stated she was hungry.   10/5-patient is more awake and alert and oriented.  She is not oriented to date.  But oriented to place and person.  She is more interactive. Eating   Consultants:   ID  nephro  neuro  Procedures:   Antimicrobials:   Iv acyclovir- completed 21 days    Subjective: More interactive.more talkative. Per daughter , she ate her breakfast, and she asked for food. She told me she wants to get up. I have  Asked PT to see if they can get her up toady. +BM.   Objective: Vitals:   08/13/20 1602 08/13/20 2345 08/14/20 0500 08/14/20 0800  BP: (!) 141/79 140/63  (!) 141/69  Pulse: (!) 108 86  86  Resp: 17 16  16   Temp: 98 F (36.7 C) 99.9 F (37.7 C)  97.9 F (36.6 C)  TempSrc: Oral Axillary  Oral  SpO2: 99% 97%  99%  Weight:   75.7 kg   Height:        Intake/Output Summary (Last 24 hours) at 08/14/2020 0901 Last data filed at 08/14/2020 0600 Gross per 24 hour  Intake 3415 ml  Output 1100 ml  Net 2315 ml   Filed Weights   08/12/20 0500 08/13/20 0500 08/14/20 0500  Weight: 76.7 kg 76.2 kg 75.7 kg    Examination:  Awake, interactive, oriented to person, place not time.   Calm, comfortable.  TF in place cta no w/r/r RRR, s1/s2 no murmur Soft bening +bs No edema b/l Mood and affective improving and appropriate in current setting    Data Reviewed: I have personally reviewed following labs and imaging  studies  CBC: Recent Labs  Lab 08/09/20 1042 08/10/20 0815  WBC 14.5* 12.8*  NEUTROABS 12.3*  --   HGB 10.6* 9.4*  HCT 30.8* 28.6*  MCV 96.6 99.7  PLT 413* 346   Basic Metabolic Panel: Recent Labs  Lab 08/09/20 1042 08/10/20 0815 08/11/20 0421 08/13/20 0920  NA 134* 132* 135 140  K 3.9  --   --   --   CL 95*  --   --   --   CO2 26  --   --   --   GLUCOSE 192*  --   --   --   BUN 42*  --   --   --   CREATININE 0.59  --   --   --   CALCIUM 9.3  --   --   --    GFR: Estimated Creatinine Clearance: 55.9 mL/min (by C-G formula based on SCr of 0.59 mg/dL). Liver Function Tests: Recent Labs  Lab 08/09/20 1042  AST 33  ALT 89*  ALKPHOS 63  BILITOT 0.5  PROT 7.3  ALBUMIN 3.0*   No results for input(s): LIPASE, AMYLASE in the last 168 hours. No results for input(s): AMMONIA in  the last 168 hours. Coagulation Profile: No results for input(s): INR, PROTIME in the last 168 hours. Cardiac Enzymes: No results for input(s): CKTOTAL, CKMB, CKMBINDEX, TROPONINI in the last 168 hours. BNP (last 3 results) No results for input(s): PROBNP in the last 8760 hours. HbA1C: No results for input(s): HGBA1C in the last 72 hours. CBG: Recent Labs  Lab 08/13/20 1649 08/13/20 2001 08/13/20 2342 08/14/20 0347 08/14/20 0802  GLUCAP 122* 161* 262* 155* 179*   Lipid Profile: No results for input(s): CHOL, HDL, LDLCALC, TRIG, CHOLHDL, LDLDIRECT in the last 72 hours. Thyroid Function Tests: No results for input(s): TSH, T4TOTAL, FREET4, T3FREE, THYROIDAB in the last 72 hours. Anemia Panel: No results for input(s): VITAMINB12, FOLATE, FERRITIN, TIBC, IRON, RETICCTPCT in the last 72 hours. Sepsis Labs: No results for input(s): PROCALCITON, LATICACIDVEN in the last 168 hours.  No results found for this or any previous visit (from the past 240 hour(s)).       Radiology Studies: No results found.      Scheduled Meds: . Chlorhexidine Gluconate Cloth  6 each Topical Daily   . docusate  100 mg Per Tube BID  . enoxaparin (LOVENOX) injection  40 mg Subcutaneous Q24H  . feeding supplement (KATE FARMS STANDARD 1.4)  325 mL Oral Daily  . Ferrous Fumarate  1 tablet Oral BID  . free water  20 mL Per Tube Q6H  . insulin aspart  0-9 Units Subcutaneous Q4H  . lactulose  20 g Oral BID  . liver oil-zinc oxide   Topical BID  . melatonin  5 mg Oral QHS  . metoprolol tartrate  12.5 mg Per Tube BID  . modafinil  100 mg Oral Daily  . multivitamin  15 mL Per Tube Daily  . pantoprazole sodium  40 mg Per Tube Daily  . rosuvastatin  20 mg Oral Daily  . sodium chloride flush  3 mL Intravenous Q12H  . sodium chloride  1 g Oral BID WC  . tamsulosin  0.4 mg Oral QPC supper  . thiamine  100 mg Oral Daily   Continuous Infusions: . sodium chloride 10 mL/hr at 07/30/20 0400  . feeding supplement (VITAL AF 1.2 CAL) 60 mL/hr at 08/14/20 0549    Assessment & Plan:   Active Problems:   SIRS (systemic inflammatory response syndrome) (HCC)   Shingles rash   Delirium   Acute metabolic encephalopathy   Essential hypertension   Anemia   Acute lower UTI   Chronic diastolic CHF (congestive heart failure) (HCC)   Sepsis (HCC)   Change in mental status   Anorexia   Malnutrition of moderate degree   Acute metabolic encephalopathy: etiology unclear, meningitis vs delirium. Mental status is labile. Mental status is worse today. S/p LP on 9/14. MRA/MRI head no acute intracranial abnormalities. Abxs were d/c. EEGs were neg for seizures or epileptiform discharges. CSF neg for HSV, West Nile, autoimmune encephalitis, enterovirus. 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  Completed acyclovir 21 day course. Pt's daughter refused provigil which was initially started by neuro  Per neurology patient's altered mental status is combination of metabolic/infectious encephalopathy (varicella associated encephalitis) /hyponatremia and hospital delirium.   As often elderly patient's cognition may lag behind significant improvement even though medical issues are adequately addressed  10/3-need to keep Na level within normal limits as this also has been playing a role in her MS change. 10/5 starting to improve. Much more interactive today.  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   Nephrology initially consulted. -Continue with salt tablets Keep sodium level stable as it does affect her mental status Continue fluid restriction   Abdominal pain/distention- obtained repeat abd. xr earlier since pt had BM. abd xr this am revealed no obstruction or ileus.  10/2-No indication the patient has pain today.  She did have a bowel movement previously which helped. Repeat abdominal x-ray revealed stabilization of tube feeding and she is currently tolerating it. No further issues.  Continue with TF until pt taking more po intake.  Had BM today.     Shingles: w/ aseptic meningitis/encephalitis. 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  No recurrent issues Continue telemetry    Poor po intake:  We will continue with tube feeding which initially was started on 07/23/2020.   P.o. intake still poor especially with her altered mental status.   Will need PEG tube placement especially going to SNF at some point as they will not take her with NG tube feeding.  Family does not want PEG to be placed here, prefer going to [facility], although aware that we cannot transfer the patient to [facility] just for PEG tube placement.  She will need PEG tube placement prior to going to SNF as they will not take her onto NG tube feeding Family still deciding on this 10/5-starting to eat more and her p.o. intake is improving.   Daughter wanted to know if we could decrease the tube feeding rate so she can eat more and I am agreeable to this.  We will decrease this to 30 mL/h from 60 mL/h since she is taking more p.o. intake.  Adjust as tolerated.  Hopefully she can get off the tube feeding if she is able to take p.o. at her baseline again    Thrombocytosis: Reactive. Continue to monitor   Sepsis: Resolved  Intra-abdominal fluid collection: w/ CT showing fluid collection likely pseudocyst, seroma or biloma, not abscess. IR did not recommend aspiration    Urinary retention:  Foley was discontinued initially however patient was retaining urine and bladder scan revealed more than 300 each time at 1 time over 600.  Foley was replaced again.   If her mental status continues to improve like it has been last couple of days we will try to do a voiding trial.     UTI: completed abx course. Resolved  Chronic diastolic CHF: Without acute exacerbation.   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 Will need and needs to improve with her po intake to get her off TF or needs  PEG before SNF takes her.            LOS: 32 days   Time spent: 35 min with >50% on coc    Lynn Ito, MD [facility] Pager [phone]  If 7PM-7AM, please contact night-coverage [email or web address] Password [email or web address] 08/14/2020, 9:01 AM

## 2020-08-15 ENCOUNTER — Inpatient Hospital Stay: Payer: Medicare Other

## 2020-08-15 DIAGNOSIS — Z515 Encounter for palliative care: Secondary | ICD-10-CM

## 2020-08-15 DIAGNOSIS — K567 Ileus, unspecified: Secondary | ICD-10-CM

## 2020-08-15 DIAGNOSIS — Z4659 Encounter for fitting and adjustment of other gastrointestinal appliance and device: Secondary | ICD-10-CM

## 2020-08-15 DIAGNOSIS — Z7189 Other specified counseling: Secondary | ICD-10-CM

## 2020-08-15 DIAGNOSIS — A879 Viral meningitis, unspecified: Secondary | ICD-10-CM

## 2020-08-15 LAB — CBC
HCT: 27.3 % — ABNORMAL LOW (ref 36.0–46.0)
Hemoglobin: 8.6 g/dL — ABNORMAL LOW (ref 12.0–15.0)
MCH: 32.2 pg (ref 26.0–34.0)
MCHC: 31.5 g/dL (ref 30.0–36.0)
MCV: 102.2 fL — ABNORMAL HIGH (ref 80.0–100.0)
Platelets: 322 10*3/uL (ref 150–400)
RBC: 2.67 MIL/uL — ABNORMAL LOW (ref 3.87–5.11)
RDW: 14.6 % (ref 11.5–15.5)
WBC: 9 10*3/uL (ref 4.0–10.5)
nRBC: 0 % (ref 0.0–0.2)

## 2020-08-15 LAB — GLUCOSE, CAPILLARY
Glucose-Capillary: 128 mg/dL — ABNORMAL HIGH (ref 70–99)
Glucose-Capillary: 131 mg/dL — ABNORMAL HIGH (ref 70–99)
Glucose-Capillary: 156 mg/dL — ABNORMAL HIGH (ref 70–99)
Glucose-Capillary: 160 mg/dL — ABNORMAL HIGH (ref 70–99)
Glucose-Capillary: 167 mg/dL — ABNORMAL HIGH (ref 70–99)
Glucose-Capillary: 188 mg/dL — ABNORMAL HIGH (ref 70–99)

## 2020-08-15 LAB — HEPATITIS PANEL, ACUTE
HCV Ab: NONREACTIVE
Hep A IgM: NONREACTIVE
Hep B C IgM: NONREACTIVE
Hepatitis B Surface Ag: NONREACTIVE

## 2020-08-15 LAB — HEPATIC FUNCTION PANEL
ALT: 228 U/L — ABNORMAL HIGH (ref 0–44)
AST: 110 U/L — ABNORMAL HIGH (ref 15–41)
Albumin: 2.5 g/dL — ABNORMAL LOW (ref 3.5–5.0)
Alkaline Phosphatase: 57 U/L (ref 38–126)
Bilirubin, Direct: <0.1 mg/dL (ref 0.0–0.2)
Total Bilirubin: 0.4 mg/dL (ref 0.3–1.2)
Total Protein: 6.3 g/dL — ABNORMAL LOW (ref 6.5–8.1)

## 2020-08-15 LAB — BASIC METABOLIC PANEL
Anion gap: 8 (ref 5–15)
BUN: 36 mg/dL — ABNORMAL HIGH (ref 8–23)
CO2: 28 mmol/L (ref 22–32)
Calcium: 9.3 mg/dL (ref 8.9–10.3)
Chloride: 105 mmol/L (ref 98–111)
Creatinine, Ser: 0.42 mg/dL — ABNORMAL LOW (ref 0.44–1.00)
GFR calc non Af Amer: >60 mL/min (ref >60)
Glucose, Bld: 152 mg/dL — ABNORMAL HIGH (ref 70–99)
Potassium: 3.9 mmol/L (ref 3.5–5.1)
Sodium: 141 mmol/L (ref 135–145)

## 2020-08-15 LAB — CK: Total CK: 82 U/L (ref 38–234)

## 2020-08-15 MED ORDER — MEGESTROL ACETATE 400 MG/10ML PO SUSP
400.0000 mg | Freq: Every day | ORAL | Status: DC
  Administered 2020-08-15 – 2020-08-23 (×7): 400 mg via ORAL
  Filled 2020-08-15 (×10): qty 10

## 2020-08-15 NOTE — Progress Notes (Signed)
Calorie Count Note  48 hour calorie count ordered. Day 1 results:  Diet: dysphagia 1 with thin liquids Supplements: Dillard [place] Standard 1.4 Vanilla po BID, each supplement provides 455 kcal and 20 grams of protein.  Dinner 10/5: 114 kcal, 6 grams of protein (25% puree pork with brown gravy, <25% mashed potatoes with gravy, puree carrots and chocolate Magic Cup) Breakfast 10/6: 255 kcal, 6 grams of protein (100% puree pancake, 50% syrup, 100% grape juice) Lunch 10/6: 373 kcal, 19 grams of protein (80% puree chicken with gravy, 50% puree pineapple, 10% puree mac and cheese, 20% puree broccoli, 100% sweet tea, 80% cranberry juice) Supplements: 227.5 kcal, 10 grams of protein (50% [employer])  Total intake Day 1: 969.5 kcal (57% of minimum estimated needs)  41 grams of protein (46% of minimum estimated needs)  Estimated Nutritional Needs:  Kcal:  1700-1900kcal/day Protein:  90-105 grams Fluid:  1.6L/day  Nutrition Dx: Moderate Malnutrition related to chronic illness (cholecystitis s/p LOA, transgastric ERCP and biliary sphincterotomy, cholecystectomy, partial gastrectomy, and colotomy 5/27, hx Roux-en-Y gastric bypass, CHF) as evidenced by mild fat depletion, mild muscle depletion, moderate muscle depletion, percent weight loss.  Goal: Patient will meet greater than or equal to 90% of their needs  Intervention:  -Continue tube feed regimen (currently decreased to 1/2 rate)     - Vital AF 1.2 Cal at 30 mL/hr     -Provides 864 kcal, 54 grams of protein, 605 mL H2O daily     -Continue free water flush of 20 mL Q6hrs to maintain tube patency -Continue Dillard [place] Standard 1.4 Vanilla po BID, each supplement provides 455 kcal and 20 grams of protein. -Continue calorie count  Between patient's PO intake in the past 24 hours and tube feeds at 1/2 rate patient has received 1833.5 kcal (100% estimated kcal needs) and 95 grams of protein (100% estimated protein needs). Will continue to monitor  outcome of calorie count but may need to consider support with tube feeds running overnight meeting approximately 50% of her needs and allow patient to eat as tolerated during the day, which will allow time off feeding tube pump for therapy and other activities.  Jacklynn Barnacle, MS, RD, LDN Pager number available on Amion

## 2020-08-15 NOTE — Progress Notes (Addendum)
ID: 79 year old female who presented with fever, encephalopathy, diffuse body aches and headache in the setting of recent zoster type rash.  LP was discussed on admission, the but the patient declined saying that she was scared of LPs. Her VZV PCR was negative, but she was started on acyclovir from admission and LP was done on 9/14.  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   Subjective: Some waxing and waning in her mental status, continues to slowly improves per family at bedside  Had an extended discussion at bedside with the patient and her family (daughters Darl Pikes in person and [name] over the phone), as well as palliative care NP Lowella Bandy.  We reviewed her hospital course, and I expressed understanding that they were frustrated on multiple points including on the lack of transfer to [facility] which they had previously requested.  Their concerns of wanting to be updated on any bedsores that the patient had and on making sure that the patient's mouth was carefully swept for food at the end of each meal was conveyed to nursing.  They shared with [place] that the patient's "pride and joy is her brain" and that she is a very active gregarious person at baseline who does not like to sit around.  We discussed that given the severity of her infection, her advanced age, and her excellent baseline, prognosis can be fairly variable.  We discussed the best case scenario she would likely continue to have some mental difficulties such as with short-term memory and concentration, but that over the course of several years she may regain some functional independence such as feeding herself and may be even helping with transfers from bed to wheelchair or ambulating with assistance of a walker in the long long-term.  We discussed that in the interim there would be multiple medical challenges due to her debility.  We discussed that this would be a very long process with no  guarantees of significant further recovery, and therefore transition to palliative/comfort care versus continuing aggressive care is reasonable.  [name] expressed some interest in neuro rehabilitation, which we discussed would require PEG tube placement.  We discussed that PEG tube placement could be reversed if the patient improved in the future, and use of the PEG tube for nutrition could also be discontinued if the family decided to transition to comfort care in the future.  Neither daughter was sure whether the patient would want to undergo the protracted recovery time predicted in this situation.  Darl Pikes also brought up concerns about the challenges of PEG tube placement given the patient's multiple prior abdominal surgeries.  Exam: Vitals:   08/14/20 2336 08/15/20 0737  BP: 139/64 140/69  Pulse: 86 83  Resp: 16 17  Temp: 97.7 F (36.5 C) (!) 97.5 F (36.4 C)  SpO2: 100% 100%   Gen: In bed, NAD Resp: non-labored breathing, no acute distress Abd: soft, nt Cardiac: RRR, perfusing extremities well   Neuro: MS: Awake, able to identify her daughter, responds to her name, but does not tell me her name or answer other questions.  CN: Extraocular movements intact, she fixates and tracks this examiner, face symmetric Motor: She moves all extremities to command, guards her lower extremities Sensory: Responds to stimulus in all four extremities  Pertinent Labs: Three EEGs on 9/10, 9/15, 9/17 all of which are negative. B12 on 9/27-444 TSH slightly elevated on 9/26 at seven, but free T4 was normal A.m. cortisol on 9/17-15 B1  was checked on 9/10 and was normal at 91  CSF WBC 274 CSF RBC-23 CSF protein-233 CSF glucose-72 CSF VDRL-negative CSF enterovirus PCR-negative  VZV IgM, CSF-normal VZV-IgG, CSF-markedly, markedly elevated VZV PCR CSF-normal HSV PCR-negative Zero oligoclonal bands Autoimmune encephalopathy panel including NMDA, AMPAR1,GABA B receptor, LGI one, CASPR2, DPPx was  negative. 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  I again reviewed the patient's hospital course with him given that they had multiple questions about her diagnosis and prognosis from a neurological perspective.  All of their questions were answered, they requested further discussion with patient's granddaughter Jacqulyn Liner   Recommendations: - continue supportive care - Consider surgical consult to discuss potential challenges of PEG tube placement given patient's significant prior abdominal surgery history - neurology will be available on an as-needed basis moving forward.  Please call if any questions or concerns arise.  Brooke Dare MD-PhD [facility] [phone]   If 8pm- 8am, please page neurology on call as listed in AMION.  Today I spent 50 minutes at bedside in goals of care discussion with family 10 minutes examining the patient  20 minutes reviewing the chart 10 minutes communicating with other care team members [facility] coordination 15 minutes in further discussion with Jacqulyn Liner

## 2020-08-15 NOTE — Progress Notes (Signed)
PROGRESS NOTE    Casey Baldwin  ZOX:[id number] DOB: 09/10/1941 DOA: 07/12/2020 PCP: Lesly Rubenstein, MD   Chief complaint.  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 Rash is intensely pruritic different stages of healing some areas of vesicular be red border. Some areas covered in black eschars. Skin is diffusely tender.Per family (asked by me) pt developed confusion. Was c/o side to back pain.  Sed rate was elevated. LP was considered but patient declined stating that she had a history of meningitis in the past and very scared of having LP done. Hospitalist was called for admission for shingles and possible encephalitis.  Patient was treated with IV acyclovir, she has so far completed the whole course. Due to poor p.o. intake, a Dobbhoff was placed on 9/13, tube feeding started. LP was performed on 9/14.  Condition consistent with viral meningitis/encephalitis. EEG did not show any seizure activity.  MRI of the brain without contrast on 9/14 showed nonspecific white matter changes, no large vessel occlusion on MRI of the brain.  Patient condition has been gradually improving, completed 21 days of IV acyclovir.   Assessment & Plan:   Active Problems:   SIRS (systemic inflammatory response syndrome) (HCC)   Shingles rash   Delirium   Acute metabolic encephalopathy   Essential hypertension   Anemia   Acute lower UTI   Chronic diastolic CHF (congestive heart failure) (HCC)   Sepsis (HCC)   Change in mental status   Anorexia   Malnutrition of moderate  degree  #1.  Acute metabolic cephalopathy. 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 Condition is slowly improving. Patient started eating again.  Tube feeding is also continued.  Continue calorie count, planning to discontinue tube feeding once patient has adequate p.o. intake.  #2.  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  Continue salt tablets.  Sodium level stable.  3.  Abdominal distention. X-ray showed ileus.  Patient has been having daily bowel movements.  No evidence of obstruction. Continue to follow.  4.  Shingles with aseptic meningitis, encephalitis. Completed a Cyclovir.  5.  Obstructive sleep apnea. Currently on 2 L oxygen at nighttime.  Will place on CPAP once patient is off tube feeding.  CPAP may also improve patient mental status.  6.  Anorexia. Patient has poor p.o. intake.  Currently receiving tube feeding.  Patient is also starting to have appetite. We will continue monitor patient intake.  Will restart megestrol. Currently, family does not want a PEG tube.  Will monitor patient, will revisit the option of PEG tube if patient does not have adequate p.o. intake in a few days.  7.  Sepsis. Condition resolved  8.  SVT. On beta-blocker.  No recurrence.  9.  Thrombocytosis.  Reactive.  Continue to follow.  10.  Urinary retention. Patient was not able to urinate after Foley catheter was removed.  Continue Foley catheter for now.  Still on Flomax.  We will try to remove Foley catheter with patient was able to sit in the chair.  11.  Severe debility. Continue physical therapy occupational therapy.  12.  Liver function changes. Check hepatitis panel.      DVT prophylaxis: Lovenox Code Status: Full Family Communication: Long discussion with daughter, all questions answered.  .   Status is: Inpatient  Remains inpatient appropriate because:Inpatient level of care appropriate due to severity of  illness   Dispo: The patient is from: Home              Anticipated d/c is to: SNF              Anticipated d/c date is: > 3 days              Patient currently is not medically stable to d/c.        I/O last 3 completed shifts: In: 3655 [P.O.:480; NG/GT:3175] Out: 2250 [Urine:2250] No intake/output data recorded.     Consultants:   Neurology  Procedures: None  Antimicrobials: None Subjective: Patient is more weak now, she appeared to have appetite, no nausea vomiting or abdominal pain.  She has daily bowel movements.  She had some abdominal distention yesterday, seem to be better today. She still confused, but was able to maintain a conversation over the last 2 days. She has not short of breath or cough. She still on Foley catheter without hematuria. No fever or chills.   Objective: Vitals:   08/14/20 1545 08/14/20 2336 08/15/20 0647 08/15/20 0737  BP: 133/60 139/64  140/69  Pulse: 89 86  83  Resp: 18 16  17   Temp: 99.7 F (37.6 C) 97.7 F (36.5 C)  (!) 97.5 F (36.4 C)  TempSrc: Oral Oral  Oral  SpO2: 100% 100%  100%  Weight:   75.3 kg   Height:        Intake/Output Summary (Last 24 hours) at 08/15/2020 1107 Last data filed at 08/15/2020 0544 Gross per 24 hour  Intake --  Output 1150 ml  Net -1150 ml   Filed Weights   08/13/20 0500 08/14/20 0500 08/15/20 0647  Weight: 76.2 kg 75.7 kg 75.3 kg    Examination:  General exam: Appears calm and comfortable  Respiratory system: Clear to auscultation. Respiratory effort normal. Cardiovascular system: S1 & S2 heard, RRR. No JVD, murmurs, rubs, gallops or clicks. No pedal edema. Gastrointestinal system: Abdomen is nondistended, soft and mildly tender. No organomegaly or masses felt. Normal bowel sounds heard. Central nervous system: Alert and oriented x1.  No focal neurological deficits. Extremities: Symmetric 5 x 5 power. Skin: No rashes, lesions or ulcers     Data Reviewed: I have personally  reviewed following labs and imaging studies  CBC: Recent Labs  Lab 08/09/20 1042 08/10/20 0815 08/15/20 0257  WBC 14.5* 12.8* 9.0  NEUTROABS 12.3*  --   --   HGB 10.6* 9.4* 8.6*  HCT 30.8* 28.6* 27.3*  MCV 96.6 99.7 102.2*  PLT 413* 346 322   Basic Metabolic Panel: Recent Labs  Lab 08/09/20 1042 08/09/20 1042 08/10/20 0815 08/11/20 0421 08/13/20 0920 08/14/20 0912 08/15/20 0257  NA 134*   < > 132* 135 140 139 141  K 3.9  --   --   --   --   --  3.9  CL 95*  --   --   --   --   --  105  CO2 26  --   --   --   --   --  28  GLUCOSE 192*  --   --   --   --   --  152*  BUN  42*  --   --   --   --   --  36*  CREATININE 0.59  --   --   --   --   --  0.42*  CALCIUM 9.3  --   --   --   --   --  9.3   < > = values in this interval not displayed.   GFR: Estimated Creatinine Clearance: 55.7 mL/min (A) (by C-G formula based on SCr of 0.42 mg/dL (L)). Liver Function Tests: Recent Labs  Lab 08/09/20 1042 08/15/20 0257  AST 33 110*  ALT 89* 228*  ALKPHOS 63 57  BILITOT 0.5 0.4  PROT 7.3 6.3*  ALBUMIN 3.0* 2.5*   No results for input(s): LIPASE, AMYLASE in the last 168 hours. No results for input(s): AMMONIA in the last 168 hours. Coagulation Profile: No results for input(s): INR, PROTIME in the last 168 hours. Cardiac Enzymes: Recent Labs  Lab 08/15/20 0257  CKTOTAL 82   BNP (last 3 results) No results for input(s): PROBNP in the last 8760 hours. HbA1C: No results for input(s): HGBA1C in the last 72 hours. CBG: Recent Labs  Lab 08/14/20 1626 08/14/20 2030 08/14/20 2338 08/15/20 0430 08/15/20 0738  GLUCAP 131* 143* 144* 160* 128*   Lipid Profile: No results for input(s): CHOL, HDL, LDLCALC, TRIG, CHOLHDL, LDLDIRECT in the last 72 hours. Thyroid Function Tests: No results for input(s): TSH, T4TOTAL, FREET4, T3FREE, THYROIDAB in the last 72 hours. Anemia Panel: No results for input(s): VITAMINB12, FOLATE, FERRITIN, TIBC, IRON, RETICCTPCT in the last 72  hours. Sepsis Labs: No results for input(s): PROCALCITON, LATICACIDVEN in the last 168 hours.  No results found for this or any previous visit (from the past 240 hour(s)).       Radiology Studies: No results found.      Scheduled Meds: . Chlorhexidine Gluconate Cloth  6 each Topical Daily  . docusate  100 mg Per Tube Daily  . enoxaparin (LOVENOX) injection  40 mg Subcutaneous Q24H  . feeding supplement (KATE FARMS STANDARD 1.4)  325 mL Oral BID BM  . Ferrous Fumarate  1 tablet Oral BID  . free water  20 mL Per Tube Q6H  . insulin aspart  0-9 Units Subcutaneous Q4H  . lactulose  20 g Oral BID  . liver oil-zinc oxide   Topical BID  . melatonin  5 mg Oral QHS  . metoprolol tartrate  12.5 mg Per Tube BID  . modafinil  100 mg Oral Daily  . multivitamin  15 mL Per Tube Daily  . pantoprazole sodium  40 mg Per Tube Daily  . rosuvastatin  20 mg Oral Daily  . sodium chloride flush  3 mL Intravenous Q12H  . sodium chloride  1 g Oral BID WC  . tamsulosin  0.4 mg Oral QPC supper  . thiamine  100 mg Oral Daily   Continuous Infusions: . sodium chloride 10 mL/hr at 07/30/20 0400  . feeding supplement (VITAL AF 1.2 CAL) 30 mL/hr at 08/14/20 1320     LOS: 33 days    Time spent: 34 minutes    Marrion Coy, MD [facility]   To contact the attending provider between 7A-7P or the covering provider during after hours 7P-7A, please log into the web site [email or web address] and access using universal [place] password for that web site. If you do not have the password, please call the hospital operator.  08/15/2020, 11:07 AM

## 2020-08-15 NOTE — Consult Note (Signed)
SURGICAL CONSULTATION NOTE   HISTORY OF PRESENT ILLNESS (HPI):  79 y.o. female admitted to [facility] with aseptic meningitis. Initially with was very lethargic and unable to tolerate oral feeding. Nasogastric feeding tube was placed under fluoroscopy guidance and patient has been fed enterally. The patient is oriented just in person so history was taken from Leroy Sea, Admitting physician and chart review.   Patient with recent choledocholithiasis on May 2021 that required minimally invasive cholecystectomy with lysis of adhesion, intra op ERCP, and repair of colon injury. As per review of chart from that admission at [facility], this was a very complicated surgery. She also had post operative complication with pancreatitis and now has a large fluid collection. Patient also has history of RYGB in 2008.   Surgery is consulted by Dr. Chipper Herb in this context for evaluation and management of gastrostomy placement.  PAST MEDICAL HISTORY (PMH):  Past Medical History:  Diagnosis Date  . Coronary artery disease   . High cholesterol   . Hypertension      PAST SURGICAL HISTORY (PSH):  Past Surgical History:  Procedure Laterality Date  . ABDOMINAL HYSTERECTOMY    . COLON RESECTION    . FACIAL COSMETIC SURGERY    . FRACTURE SURGERY     left leg and left ankle.  Marland Kitchen GASTRIC BYPASS    . left knee replacement       MEDICATIONS:  Prior to Admission medications   Medication Sig Start Date End Date Taking? Authorizing Provider  Acetaminophen 500 MG capsule Take 500 mg by mouth daily as needed.     [provider]  Artificial Tear Ointment (DRY EYES OP) Place 1-2 drops into both eyes daily as needed.    [provider]  aspirin 81 MG EC tablet Take 81 mg by mouth daily.     [provider]  bisacodyl (DULCOLAX) 5 MG EC tablet Take 10 mg by mouth at bedtime.     [provider]  calcium carbonate (OS-CAL) 1250 (500 Ca) MG chewable tablet Chew 2 tablets by mouth  daily.    [provider]  Cholecalciferol (VITAMIN D PO) Take 1 tablet by mouth daily.    [provider]  furosemide (LASIX) 20 MG tablet Take 20-40 mg by mouth daily. CAN TAKE 40MG  FOR SWELLING 04/20/19   [provider]  isosorbide mononitrate (IMDUR) 30 MG 24 hr tablet Take 30 mg by mouth daily. 01/08/20   [provider]  Multiple Vitamin (MULTIVITAMIN WITH MINERALS) TABS tablet Take 1 tablet by mouth daily.    [provider]  Multiple Vitamins-Minerals (ICAPS AREDS 2 PO) Take 1 capsule by mouth daily.    [provider]  ondansetron (ZOFRAN) 4 MG tablet Take 4 mg by mouth daily as needed. 05/24/20   [provider]  pantoprazole (PROTONIX) 20 MG tablet Take 20 mg by mouth daily. 02/22/20   [provider]  rosuvastatin (CRESTOR) 20 MG tablet Take 20 mg by mouth daily. 06/19/20   [provider]  senna-docusate (SENOKOT-S) 8.6-50 MG tablet Take 2 tablets by mouth 2 (two) times daily. Patient not taking: Reported on 04/01/2020 11/21/19   01/19/20, MD  witch hazel-glycerin (TUCKS) pad Apply 1 application topically as needed for itching. Patient not taking: Reported on 07/10/2020 11/21/19   01/19/20, MD     ALLERGIES:  Allergies  Allergen Reactions  . Lidocaine Anaphylaxis and Other (See Comments)    Became unconscious with administration for dental procedure at age  21yo. Pt tolerated subsequent lidocaine.    . Amlodipine Other (See Comments)    Hand and leg cramping  . Lactose Diarrhea  . Lactose Intolerance (Gi)   . Latex      SOCIAL HISTORY:  Social History   Socioeconomic History  . Marital status: Widowed    Spouse name: Not on file  . Number of children: Not on file  . Years of education: Not on file  . Highest education level: Not on file  Occupational History  . Not on file  Tobacco Use  . Smoking status: Never Smoker  . Smokeless tobacco: Never Used  Vaping Use  . Vaping  Use: Never used  Substance and Sexual Activity  . Alcohol use: No  . Drug use: No  . Sexual activity: Not on file  Other Topics Concern  . Not on file  Social History Narrative  . Not on file   Social Determinants of Health   Financial Resource Strain:   . Difficulty of Paying Living Expenses: Not on file  Food Insecurity:   . Worried About Programme researcher, broadcasting/film/video in the Last Year: Not on file  . Ran Out of Food in the Last Year: Not on file  Transportation Needs:   . Lack of Transportation (Medical): Not on file  . Lack of Transportation (Non-Medical): Not on file  Physical Activity:   . Days of Exercise per Week: Not on file  . Minutes of Exercise per Session: Not on file  Stress:   . Feeling of Stress : Not on file  Social Connections:   . Frequency of Communication with Friends and Family: Not on file  . Frequency of Social Gatherings with Friends and Family: Not on file  . Attends Religious Services: Not on file  . Active Member of Clubs or Organizations: Not on file  . Attends Banker Meetings: Not on file  . Marital Status: Not on file  Intimate Partner Violence:   . Fear of Current or Ex-Partner: Not on file  . Emotionally Abused: Not on file  . Physically Abused: Not on file  . Sexually Abused: Not on file      FAMILY HISTORY:  Family History  Problem Relation Age of Onset  . Colon cancer Mother      REVIEW OF SYSTEMS:  Constitutional: positive for weight loss Eyes: denies any other vision changes, history of eye injury  ENT: denies sore throat, hearing problems  Respiratory: positive shortness of breath, wheezing  Cardiovascular: denies chest pain, palpitations  Gastrointestinal: abdominal pain, nausea and vomiting Genitourinary: denies burning with urination or urinary frequency Musculoskeletal: denies any other joint pains or cramps. Positive for weakness  Skin: denies any other rashes or skin discolorations  Neurological: denies any other  headache, Positive for dizziness, weakness  Psychiatric: denies any other depression, anxiety   All other review of systems were negative   VITAL SIGNS:  Temp:  [97.5 F (36.4 C)-97.7 F (36.5 C)] 97.5 F (36.4 C) (10/06 0737) Pulse Rate:  [83-86] 83 (10/06 0737) Resp:  [16-17] 17 (10/06 0737) BP: (139-140)/(64-69) 140/69 (10/06 0737) SpO2:  [100 %] 100 % (10/06 0737) Weight:  [75.3 kg] 75.3 kg (10/06 0647)     Height: 5\' 4"  (162.6 cm) Weight: 75.3 kg BMI (Calculated): 29.01   INTAKE/OUTPUT:  This shift: No intake/output data recorded.  Last 2 shifts: @IOLAST2SHIFTS @   PHYSICAL EXAM:  Constitutional:  -- Normal body habitus  -- Awake, oriented x1 Eyes:  --  Pupils equally round and reactive to light  -- No scleral icterus  Ear, nose, and throat:  -- No jugular venous distension  Pulmonary:  -- No crackles  -- Equal breath sounds bilaterally -- Breathing non-labored at rest Cardiovascular:  -- S1, S2 present  -- No pericardial rubs Gastrointestinal:  -- Abdomen soft, nontender, non-distended, no guarding or rebound tenderness. Multiple abdominal scars -- No abdominal masses appreciated, pulsatile or otherwise  Musculoskeletal and Integumentary:  -- Wounds: None appreciated -- Extremities: B/L UE and LE FROM, hands and feet warm, no edema  Neurologic:  -- Motor function: intact and symmetric -- Sensation: intact and symmetric   Labs:  CBC Latest Ref Rng & Units 08/15/2020 08/10/2020 08/09/2020  WBC 4.0 - 10.5 K/uL 9.0 12.8(H) 14.5(H)  Hemoglobin 12.0 - 15.0 g/dL 4.2(H) 0.6(C) 10.6(L)  Hematocrit 36 - 46 % 27.3(L) 28.6(L) 30.8(L)  Platelets 150 - 400 K/uL 322 346 413(H)   CMP Latest Ref Rng & Units 08/15/2020 08/14/2020 08/13/2020  Glucose 70 - 99 mg/dL 376(E) - -  BUN 8 - 23 mg/dL 83(T) - -  Creatinine 5.17 - 1.00 mg/dL 6.16(W) - -  Sodium 737 - 145 mmol/L 141 139 140  Potassium 3.5 - 5.1 mmol/L 3.9 - -  Chloride 98 - 111 mmol/L 105 - -  CO2 22 - 32 mmol/L 28 - -   Calcium 8.9 - 10.3 mg/dL 9.3 - -  Total Protein 6.5 - 8.1 g/dL 6.3(L) - -  Total Bilirubin 0.3 - 1.2 mg/dL 0.4 - -  Alkaline Phos 38 - 126 U/L 57 - -  AST 15 - 41 U/L 110(H) - -  ALT 0 - 44 U/L 228(H) - -     Imaging studies:  I personally evaluated the CT scan. Unable to identify the excluded stomach and difficult to delineate the small bowel anatomy. Large right abdomen fluid collection, pancreatic pseudoaneurysm?  EXAM: CT ABDOMEN AND PELVIS WITH CONTRAST  TECHNIQUE: Multidetector CT imaging of the abdomen and pelvis was performed using the standard protocol following bolus administration of intravenous contrast.  CONTRAST:  OMNIPAQUE IOHEXOL 300 MG/ML  SOLN  COMPARISON:  07/14/2020  FINDINGS: Lower chest: Hypoventilatory changes are seen at the lung bases. No acute pleural or parenchymal lung disease.  Hepatobiliary: No focal liver abnormality is seen. Status post cholecystectomy. No biliary dilatation.  Pancreas: The pancreas enhances normally, without focal abnormality. There is mild chronic pancreatic duct dilation.  Spleen: Normal in size without focal abnormality.  Adrenals/Urinary Tract: Adrenal glands are unremarkable. Kidneys are normal, without renal calculi, focal lesion, or hydronephrosis. Bladder is unremarkable.  Stomach/Bowel: Stable postsurgical changes from previous bariatric surgery. No evidence of bowel obstruction or ileus. No bowel wall thickening or inflammatory change.  Vascular/Lymphatic: Stable atherosclerosis throughout the aorta. No pathologic adenopathy.  Reproductive: Status post hysterectomy. No adnexal masses.  Other: The fluid collection arising from the inferolateral margin of the pancreatic head is again identified and unchanged, measuring 3.9 x 10.2 cm. Inflammatory changes are seen within the mesentery along the right paracolic gutter, stable. This fluid collection does not demonstrate any significant rim  enhancement and there is no internal gas to suggest abscess. I would favor postoperative fluid collection after cholecystectomy between the 04/01/2020 and 07/14/2020 exams. This could reflect postoperative seroma or biloma.  No other free fluid or free gas. Stable right lower quadrant ventral hernias containing multiple loops of small bowel. No evidence of obstruction or strangulation.  Musculoskeletal: No acute or destructive bony lesions. Reconstructed  images demonstrate no additional findings.  IMPRESSION: 1. Stable fluid collection in the right mid abdomen, favor postoperative seroma or biloma after cholecystectomy. No rim enhancement or internal gas to suggest abscess. 2. No other acute intra-abdominal or intrapelvic process. 3. Stable right lower quadrant ventral hernias. 4.  Aortic Atherosclerosis (ICD10-I70.0).  Assessment/Plan:  79 y.o. female with dysphagia due to meningitis, complicated by pertinent comorbidities including CHG, HTN, HLD, OSA.  I was consulted for evaluation, discussion and recommendation regarding placing a gastrostomy tube. This patient has a very complicated history of abdominal surgeries that include bariatric Roux n Y gastric bypass, and recent cholecystectomy with intra op ERCP complicated by colonic injury with repair. As per OR report, patient had abundant adhesions from previous surgery. In addition patient had post operative complication with pancreatitis and fluid collection which increase the already hostile abdomen. This makes this surgery a major surgery for gastrostomy tube.   I discuss with the daughter, Darl PikesSusan, about the reality of the difficulty of the surgery that is not a simple gastrostomy tube placement but a major surgery with lysis of adhesion to be able to orient the small bowel to make sure that the gastrostomy tube is being placed in the right position. The risk of enterotomies is high making her a high risk for enterocutaneous  fistulas.   I also discussed the case with Dietitian, Felix PaciniLeanne King, that told me that patient is slowly increasing the amount of caloric intake orally. She is seeing progress specially because she has seen that the patient is more awake and alert. I also think that with the help of the family, this progress can continue to improve.  If that is the case, the risk of a gastrostomy placement on this patient at this moment is higher than the benefit. I do not recommend gastrostomy placement at this moment. Patient's daughter Darl PikesSusan agreed with the recommendations.   Gae GallopEdgardo Cintrn-Daz, MD

## 2020-08-15 NOTE — Consult Note (Signed)
Consultation Note Date: 08/15/2020   Patient Name: Casey Baldwin  DOB: Aug 25, 1941  MRN: [id number]  Age / Sex: 79 y.o., female   PCP: Annice Needy, MD Referring Physician: Sharen Hones, MD   REASON FOR CONSULTATION:Establishing goals of care  Palliative Care consult requested for goals of care discussion in this 79 y.o. female with multiple medical problems including Chronic diastolic CHF, hyperlipidemia, obstructive sleep apnea, hypertension, Roux-en-Y gastric bypass (2011), and diabetes type 2.   Patient presented to the ED with complaints of body aches, headaches, and shortness of breath.  Family reported home temperature of 102.2.  During work-up UA showed UTI.  Sed rate elevated.  Since admission patient had LP (9/1 4) with findings consistent with viral meningitis/encephalitis.  Patient treated with IV acyclovir and has completed course.  Continues to be somewhat lethargic with intermittent confusion.  Patient has been followed by neurology.  EEG did not show any seizure activity.  Patient required Dobbhoff placement (07/23/20) tube feedings have been initiated.  Continues to have poor appetite but with some gradual improvement.   Clinical Assessment and Goals of Care: I have reviewed medical records including lab results, imaging, Epic notes, and MAR, received report from the bedside RN, and assessed the patient. I met at the bedside with her daughter Casey Baldwin, daughter Casey Baldwin via phone, and Dr. Curly Shores to discuss diagnosis prognosis, GOC, disposition, and options.  We discussed a brief life review of the patient, along with patient's functional and nutritional status.  Patient is a former [employer] scientist.  She has 2 daughters.  Granddaughter Casey Baldwin is a Pharm-D in [place].   Daughters report prior to admission patient was very strong-willed, alert and oriented without complications.  Speak to the longevity of their family with patient having multiple symptoms siblings in their 20s and high  functioning.  Family reports prior to patient's current illness she had some health concerns from previous admissions however was able to provide care after spending some time in rehab.  We discussed Her current illness and what it means in the larger context of Her on-going co-morbidities. With specific discussions regarding her current illness, best case scenario, prognosis, and options. Natural disease trajectory and expectations were discussed. Dr. Curly Shores (Neurology) is at the bedside providing extensive updates, outlook, and prognosis.  Family shares at length their concerns with patient's current condition. They expressed their frustration in what they perceive as limited care and the denial to transfer to [facility] as they had requested.   Family reports patient took great pride in her knowledge and "brain". They report she was "sharper than most people her age!"   Detailed education and explanation on prognosis and available options considering best case (continued aggressive treatment, continued improvement, rehabilitation, long recovery process months to years) and worst case scenario (no further improvement, further decline).  Detailed discussion regarding PEG tube placement in the setting of continued aggressive care and allowing patient every opportunity to show improvement versus comfort care with awareness patient may not have appropriate oral nutrition to sustain life.  Family expressed concerns with ability for patient to have PEG given her previous extensive abdominal surgeries.  Family educated and verbalized their awareness that PEG tube could potentially be reversed if patient showed great improvement and or if there focus on care transition to comfort the PEG tube could potentially remain and be utilized as a source of medication administration.  Both daughters mutually express that they felt if their mother was unable to control her functions, unable to  perform her ADLs, knowing that her  "brain is her pride" that she would not want to live and that current states with the fear of her becoming isolated and further declining.  They remain undecided regarding PEG tube however they do express that if patient showed no improvement they do not feel she would want a PEG or if decided to proceed would not want on a long-term basis.  Dr. Curly Shores provided clear expectations of prolonged recovery and rehabilitation if family chose to continue with aggressive interventions allowing patient and opportunity to improve. Daughters verbalized understanding expressing they would have to continue discussion and include patient's granddaughter, Casey Baldwin before making any final decisions.   We attempted to elicit values and goals of care important to the patient.    The difference between aggressive medical intervention and comfort care was considered in light of the patient's goals of care.Family was encouraged to continue ongoing discussions while keeping patients wishes, quality of life, and what they know to be best and would align with their mother's wishes as the center of their decision making.   Family reports they are unaware of patient having a documented advanced directive. They state her lawyer did not have knowledge of such documents.  Family expressed that patient would not want her life prolonged and again expressed the importance of quality of life.   Daughters encouraged to continue ongoing discussions.   Questions and concerns were addressed. The family was encouraged to call with questions or concerns.  PMT will continue to support holistically.   SOCIAL HISTORY:     reports that she has never smoked. She has never used smokeless tobacco. She reports that she does not drink alcohol and does not use drugs.  CODE STATUS: Full code (Family in discussion)  ADVANCE DIRECTIVES: Primary Decision Maker: daughters Casey Baldwin and Casey Baldwin    SYMPTOM MANAGEMENT: per attending   Palliative  Prophylaxis:   Aspiration, Delirium Protocol, Eye Care, Frequent Pain Assessment, Oral Care and Palliative Wound Care  PSYCHO-SOCIAL/SPIRITUAL:  Support System: Family  Desire for further Chaplaincy support:No   Additional Recommendations (Limitations, Scope, Preferences):  Full Scope Treatment and Continue to treat the treatable  Education on PEG, prognosis, continued aggressive interventions vs. comfort   PAST MEDICAL HISTORY: Past Medical History:  Diagnosis Date  . Coronary artery disease   . High cholesterol   . Hypertension     ALLERGIES:  is allergic to lidocaine, amlodipine, lactose, lactose intolerance (gi), and latex.   MEDICATIONS:  Current Facility-Administered Medications  Medication Dose Route Frequency Provider Last Rate Last Admin  . 0.9 %  sodium chloride infusion   Intravenous PRN Sharen Hones, MD 10 mL/hr at 07/30/20 0400 Rate Verify at 07/30/20 0400  . acetaminophen (TYLENOL) 160 MG/5ML solution 650 mg  650 mg Oral Q6H PRN Nolberto Hanlon, MD       Or  . acetaminophen (TYLENOL) suppository 650 mg  650 mg Rectal Q6H PRN Nolberto Hanlon, MD      . Chlorhexidine Gluconate Cloth 2 % PADS 6 each  6 each Topical Daily Nolberto Hanlon, MD   6 each at 08/14/20 1000  . docusate (COLACE) 50 MG/5ML liquid 100 mg  100 mg Per Tube Daily Nolberto Hanlon, MD   100 mg at 08/15/20 0838  . enoxaparin (LOVENOX) injection 40 mg  40 mg Subcutaneous Q24H Nolberto Hanlon, MD   40 mg at 08/15/20 0835  . feeding supplement (KATE FARMS STANDARD 1.4) liquid 325 mL  325 mL Oral BID BM Amery,  Sahar, MD   325 mL at 08/15/20 0841  . feeding supplement (VITAL AF 1.2 CAL) liquid 1,000 mL  1,000 mL Per Tube Continuous Nolberto Hanlon, MD 30 mL/hr at 08/14/20 1320 Rate Change at 08/14/20 1320  . Ferrous Fumarate (HEMOCYTE - 106 mg FE) tablet 106 mg of iron  1 tablet Oral BID Nolberto Hanlon, MD   106 mg of iron at 08/15/20 0835  . free water 20 mL  20 mL Per Tube Q6H Kolluru, Sarath, MD   20 mL at 08/15/20 0447   . haloperidol lactate (HALDOL) injection 1 mg  1 mg Intravenous Q6H PRN Sharen Hones, MD   1 mg at 07/22/20 1405  . insulin aspart (novoLOG) injection 0-9 Units  0-9 Units Subcutaneous Q4H Toy Baker, MD   1 Units at 08/15/20 0836  . lactulose (CHRONULAC) 10 GM/15ML solution 20 g  20 g Oral BID Sharen Hones, MD   20 g at 08/15/20 0835  . liver oil-zinc oxide (DESITIN) 40 % ointment   Topical PRN Nolberto Hanlon, MD      . liver oil-zinc oxide (DESITIN) 40 % ointment   Topical BID Wyvonnia Dusky, MD   Given at 08/15/20 [phone]  . melatonin tablet 5 mg  5 mg Oral QHS Sharion Settler, NP   5 mg at 08/14/20 2044  . metoprolol tartrate (LOPRESSOR) 25 mg/10 mL oral suspension 12.5 mg  12.5 mg Per Tube BID Nolberto Hanlon, MD   12.5 mg at 08/14/20 2305  . metoprolol tartrate (LOPRESSOR) injection 5 mg  5 mg Intravenous Q8H PRN Wyvonnia Dusky, MD      . modafinil (PROVIGIL) tablet 100 mg  100 mg Oral Daily Leotis Pain, MD   100 mg at 08/15/20 0834  . multivitamin liquid 15 mL  15 mL Per Tube Daily Pernell Dupre, RPH   15 mL at 08/15/20 0839  . ondansetron (ZOFRAN) tablet 4 mg  4 mg Oral Q6H PRN Doutova, Anastassia, MD       Or  . ondansetron (ZOFRAN) injection 4 mg  4 mg Intravenous Q6H PRN Doutova, Anastassia, MD   4 mg at 07/13/20 1148  . pantoprazole sodium (PROTONIX) 40 mg/20 mL oral suspension 40 mg  40 mg Per Tube Daily Pernell Dupre, RPH   40 mg at 08/15/20 0835  . rosuvastatin (CRESTOR) tablet 20 mg  20 mg Oral Daily Doutova, Nyoka Lint, MD   20 mg at 08/15/20 0834  . sodium chloride flush (NS) 0.9 % injection 3 mL  3 mL Intravenous Q12H Doutova, Anastassia, MD   3 mL at 08/15/20 0840  . sodium chloride tablet 1 g  1 g Oral BID WC Nolberto Hanlon, MD   1 g at 08/15/20 0834  . tamsulosin (FLOMAX) capsule 0.4 mg  0.4 mg Oral QPC supper Sharen Hones, MD   0.4 mg at 08/14/20 1734  . thiamine tablet 100 mg  100 mg Oral Daily Nolberto Hanlon, MD   100 mg at 08/15/20 0834    VITAL  SIGNS: BP 140/69 (BP Location: Left Arm)   Pulse 83   Temp (!) 97.5 F (36.4 C) (Oral)   Resp 17   Ht _0  (1.626 m)   Wt 75.3 kg   SpO2 100%   BMI 28.49 kg/m  Filed Weights   08/13/20 0500 08/14/20 0500 08/15/20 0647  Weight: 76.2 kg 75.7 kg 75.3 kg    Estimated body mass index is 28.49 kg/m as calculated from the following:   Height  as of this encounter: _0  (1.626 m).   Weight as of this encounter: 75.3 kg.  LABS: CBC:    Component Value Date/Time   WBC 9.0 08/15/2020 0257   HGB 8.6 (L) 08/15/2020 0257   HCT 27.3 (L) 08/15/2020 0257   PLT 322 08/15/2020 0257   Comprehensive Metabolic Panel:    Component Value Date/Time   NA 141 08/15/2020 0257   K 3.9 08/15/2020 0257   BUN 36 (H) 08/15/2020 0257   CREATININE 0.42 (L) 08/15/2020 0257   ALBUMIN 3.0 (L) 08/09/2020 1042   ALBUMIN 3.4 (L) 07/24/2020 1825     Review of Systems  Unable to perform ROS: Mental status change   Physical Exam General: NAD, frail chronically-ill appearing Pulmonary: normal breathing pattern, dobbhof tube in place Extremities: moves all extremeties Skin: reported sacral wound (not assessed) Neurological: alert to family and name, will not respond to questions   Prognosis: Guarded-Poor   Discharge Planning:  To Be Determined  Recommendations: . Full Code-Family continuing to discuss . Continue with current plan of care per medical team  . Daughters are remaining hopeful but also preparing for the worst. Request to continue with ongoing goals of care discussions, including PEG, Code status, and aggressive vs comfort care. Would like evaluation to determine if PEG is even an option as they make decisions. Marland Kitchen PMT will continue to support and follow. Please call team line with urgent needs.   Palliative Performance Scale:                Family expressed understanding and was in agreement with this plan.   Thank you for allowing the Palliative Medicine Team to assist in the care  of this patient.  Time In: 0935 Time Out: 1045 Time Total: 70 min.   Visit consisted of counseling and education dealing with the complex and emotionally intense issues of symptom management and palliative care in the setting of serious and potentially life-threatening illness.Greater than 50%  of this time was spent counseling and coordinating care related to the above assessment and plan.  Signed by:  Alda Lea, AGPCNP-BC Palliative Medicine Team  Phone: [phone] Pager: [phone] Amion: Bjorn Pippin

## 2020-08-16 DIAGNOSIS — Z978 Presence of other specified devices: Secondary | ICD-10-CM

## 2020-08-16 DIAGNOSIS — D75839 Thrombocytosis, unspecified: Secondary | ICD-10-CM

## 2020-08-16 DIAGNOSIS — Z66 Do not resuscitate: Secondary | ICD-10-CM

## 2020-08-16 DIAGNOSIS — G4733 Obstructive sleep apnea (adult) (pediatric): Secondary | ICD-10-CM

## 2020-08-16 DIAGNOSIS — I471 Supraventricular tachycardia: Secondary | ICD-10-CM

## 2020-08-16 DIAGNOSIS — R338 Other retention of urine: Secondary | ICD-10-CM

## 2020-08-16 DIAGNOSIS — E871 Hypo-osmolality and hyponatremia: Secondary | ICD-10-CM

## 2020-08-16 LAB — BASIC METABOLIC PANEL
Anion gap: 9 (ref 5–15)
BUN: 31 mg/dL — ABNORMAL HIGH (ref 8–23)
CO2: 28 mmol/L (ref 22–32)
Calcium: 9 mg/dL (ref 8.9–10.3)
Chloride: 99 mmol/L (ref 98–111)
Creatinine, Ser: 0.46 mg/dL (ref 0.44–1.00)
GFR calc non Af Amer: >60 mL/min (ref >60)
Glucose, Bld: 161 mg/dL — ABNORMAL HIGH (ref 70–99)
Potassium: 3.9 mmol/L (ref 3.5–5.1)
Sodium: 136 mmol/L (ref 135–145)

## 2020-08-16 LAB — GLUCOSE, CAPILLARY
Glucose-Capillary: 105 mg/dL — ABNORMAL HIGH (ref 70–99)
Glucose-Capillary: 131 mg/dL — ABNORMAL HIGH (ref 70–99)
Glucose-Capillary: 149 mg/dL — ABNORMAL HIGH (ref 70–99)
Glucose-Capillary: 156 mg/dL — ABNORMAL HIGH (ref 70–99)
Glucose-Capillary: 162 mg/dL — ABNORMAL HIGH (ref 70–99)
Glucose-Capillary: 172 mg/dL — ABNORMAL HIGH (ref 70–99)

## 2020-08-16 LAB — MAGNESIUM: Magnesium: 2.1 mg/dL (ref 1.7–2.4)

## 2020-08-16 LAB — PHOSPHORUS: Phosphorus: 3.7 mg/dL (ref 2.5–4.6)

## 2020-08-16 MED ORDER — VITAL AF 1.2 CAL PO LIQD
360.0000 mL | ORAL | Status: DC
  Administered 2020-08-16: 360 mL

## 2020-08-16 NOTE — Progress Notes (Addendum)
Calorie Count Note  48 hour calorie count ordered. Day 2 results below:  Diet: dysphagia 1 with thin liquids Supplements: Molli Posey Standard 1.4 Vanilla po BID, each supplement provides 455 kcal and 20 grams of protein.  Dinner 10/6: 349 kcal, 21.5 grams of protein (80% [place] with gravy, 20% puree green beans, 100% unsweet iced tea, 50% orange Magic Cup) Breakfast 10/7: 140 kcal, 10.5 grams of protein (50% puree sausage, 100% orange juice) Lunch 10/7: 761 kcal, 35 grams of protein (25% puree beef with gravy, 25% mashed potatoes, 100% puree apple cobbler, 50% [place] vanilla yogurt, 100% sweet tea, 100% apple juice, 100% orange Magic Cup) Supplements: N/A (0%)  Total intake Day 2: 1250 kcal (74% of minimum estimated needs)  67 grams of protein (74% of minimum estimated needs)  Estimated Nutritional Needs: Kcal:1700-1900kcal/day Protein:90-105 grams Fluid:1.6L/day  Nutrition Dx: Moderate Malnutritionrelated to chronic illness (cholecystitis s/p LOA, transgastric ERCP and biliary sphincterotomy, cholecystectomy, partial gastrectomy, and colotomy 5/27, hx Roux-en-Y gastric bypass, CHF)as evidenced by mild fat depletion, mild muscle depletion, moderate muscle depletion, percent weight loss.  Goal: Patient will meet greater than or equal to 90% of their needs  Intervention: -Decrease TF regimen to meet 25% of patient's needs per discussion with daughter Leward Quan:     -Vital AF 1.2 Cal at 30 mL/hr over 12 hours from 2000-0800     -Provides 432 kcal, 27 grams of protein, 292 mL H2O daily     -Continue free water flush of 20 mL Q6hrs to maintain tube patency -Continue Molli Posey Standard 1.4 Vanilla po BID, each supplement provides 455 kcal and 20 grams of protein -Extend calorie count to continue to monitor PO intake  Discussed with MD and RN via secure chat.  Felix Pacini, MS, RD, LDN Pager number available on Amion

## 2020-08-16 NOTE — Progress Notes (Signed)
Notified MD of pt right knee being swollen, MD to have xray done on 10/8

## 2020-08-16 NOTE — Progress Notes (Addendum)
PROGRESS NOTE    Casey Baldwin  YKD:[id number] DOB: 1941/06/10 DOA: 07/12/2020 PCP: Lesly Rubenstein, MD   Chief complaint.  Altered mental status.  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 Rash is intensely pruritic different stages of healing some areas of vesicular be red border. Some areas covered in black eschars. Skin is diffusely tender.Per family (asked by me) pt developed confusion. Was c/o side to back pain.  Sed rate was elevated. LP was considered but patient declined stating that she had a history of meningitis in the past and very scared of having LP done. Hospitalist was called for admission for shingles and possible encephalitis.  Patient was treated with IV acyclovir, she has so far completed the whole course. Due to poor p.o. intake, a Dobbhoff was placed on 9/13, tube feeding started. LP was performed on 9/14.  Condition consistent with viral meningitis/encephalitis. EEG did not show any seizure activity.  MRI of the brain without contrast on 9/14 showed nonspecific white matter changes, no large vessel occlusion on MRI of the brain.  Patient condition has been gradually improving, completed 21 days of IV acyclovir.   Assessment & Plan:   Active Problems:   SIRS (systemic inflammatory response syndrome) (HCC)   Shingles rash   Delirium   Acute metabolic encephalopathy   Essential hypertension   Anemia   Acute lower UTI   Chronic diastolic CHF (congestive heart failure) (HCC)   Sepsis (HCC)   Change in mental status   Anorexia   Malnutrition of moderate  degree  #1.  Acute metabolic encephalopathy. Most likely due to VZV meningitis, encephalitis and encephalopathy. Completed acyclovir. Patient mental status is slowly improving.  #2.  Anorexia. Patient appetite still poor, but started eating.  Still has Dobbhoff feeding. Consulted surgery for possible PEG tube placement.  However, due to previous multiple and complex abdominal surgery, PEG tube placement is very risky.  The decision was made to against PEG tube placement after discussing with the family.  Appreciate general surgery input. Megestrol was started to try to improve patient appetite.  Continue tube feeding until patient appetite is improving and p.o. intake is adequate.  #3.  Abdominal distention. No bowel obstruction.  Continue to follow  4.  Hyponatremia. Sodium level normalized.  5.  Shingles with aseptic meningitis, encephalitis. Completed acyclovir.  6.  Urinary retention. Continue Foley catheter.  Attempted to remove Foley catheter was unsuccessful 9 days ago.  Continue Flomax.  7.  Anemia of chronic disease. Previously B12 was normal, iron level 22, mildly low.  Mostly anemia of chronic disease.  Follow.  8. Right foot pain. X ray suspects osteomyelitis. Clinically does not seem to support diagnosis, will hold off antibiotics, obtain MRI.  DVT prophylaxis: Lovenox Code Status: Full Family Communication: Daughter updated  .   Status is: Inpatient  Remains inpatient appropriate because:IV treatments appropriate due to intensity of illness or inability to take PO   Dispo: The patient is from: Home              Anticipated d/c is to: SNF  Anticipated d/c date is: > 3 days              Patient currently is not medically stable to d/c.        I/O last 3 completed shifts: In: 0  Out: 1750 [Urine:1750] No intake/output data recorded.     Consultants:   Surgery  Procedures: None  Antimicrobials:None  Subjective: Patient still  very confused, she slept well overnight.  Drowsy this morning. Appetite is still very poor, but had a good dinner last night. Denies any short of breath or cough. No abdominal pain.  No nausea vomiting. No fever chills. Still on Foley catheter.  Objective: Vitals:   08/15/20 1543 08/15/20 2352 08/16/20 0639 08/16/20 0750  BP: 130/66 (!) 142/70  137/69  Pulse: 90 93  94  Resp: 18 16  18   Temp: 98.3 F (36.8 C) 98.7 F (37.1 C)  98.4 F (36.9 C)  TempSrc: Oral Oral    SpO2: 100% 100%  100%  Weight:   77.3 kg   Height:        Intake/Output Summary (Last 24 hours) at 08/16/2020 1058 Last data filed at 08/16/2020 0351 Gross per 24 hour  Intake 0 ml  Output 1050 ml  Net -1050 ml   Filed Weights   08/14/20 0500 08/15/20 0647 08/16/20 0639  Weight: 75.7 kg 75.3 kg 77.3 kg    Examination:  General exam: Appears calm and comfortable  Respiratory system: Clear to auscultation. Respiratory effort normal. Cardiovascular system: S1 & S2 heard, RRR. No JVD, murmurs, rubs, gallops or clicks. No pedal edema. Gastrointestinal system: Abdomen is mildly distended, soft and nontender. No organomegaly or masses felt. Normal bowel sounds heard. Central nervous system: Alert and oriented x1. No focal neurological deficits. Extremities: Symmetric Skin: No rashes, lesions or ulcers     Data Reviewed: I have personally reviewed following labs and imaging studies  CBC: Recent Labs  Lab 08/10/20 0815 08/15/20 0257  WBC 12.8* 9.0  HGB 9.4* 8.6*  HCT 28.6* 27.3*  MCV 99.7 102.2*  PLT 346 322   Basic Metabolic Panel: Recent Labs  Lab 08/11/20 0421 08/13/20 0920 08/14/20 0912 08/15/20 0257 08/16/20 0256  NA 135 140 139 141 136  K  --   --   --  3.9 3.9  CL  --   --   --  105 99  CO2  --   --   --  28 28  GLUCOSE  --   --   --  152* 161*  BUN  --   --   --  36* 31*  CREATININE  --   --   --  0.42* 0.46  CALCIUM  --   --   --  9.3 9.0  MG  --   --   --   --  2.1  PHOS  --    --   --   --  3.7   GFR: Estimated Creatinine Clearance: 56.4 mL/min (by C-G formula based on SCr of 0.46 mg/dL). Liver Function Tests: Recent Labs  Lab 08/15/20 0257  AST 110*  ALT 228*  ALKPHOS 57  BILITOT 0.4  PROT 6.3*  ALBUMIN 2.5*   No results for input(s): LIPASE, AMYLASE in the last 168 hours. No results for input(s): AMMONIA in the last 168 hours. Coagulation Profile: No results for input(s): INR, PROTIME in the last 168 hours. Cardiac Enzymes: Recent Labs  Lab 08/15/20 0257  CKTOTAL 82   BNP (last 3 results)  No results for input(s): PROBNP in the last 8760 hours. HbA1C: No results for input(s): HGBA1C in the last 72 hours. CBG: Recent Labs  Lab 08/15/20 1703 08/15/20 2020 08/15/20 2354 08/16/20 0354 08/16/20 0750  GLUCAP 131* 167* 156* 156* 149*   Lipid Profile: No results for input(s): CHOL, HDL, LDLCALC, TRIG, CHOLHDL, LDLDIRECT in the last 72 hours. Thyroid Function Tests: No results for input(s): TSH, T4TOTAL, FREET4, T3FREE, THYROIDAB in the last 72 hours. Anemia Panel: No results for input(s): VITAMINB12, FOLATE, FERRITIN, TIBC, IRON, RETICCTPCT in the last 72 hours. Sepsis Labs: No results for input(s): PROCALCITON, LATICACIDVEN in the last 168 hours.  No results found for this or any previous visit (from the past 240 hour(s)).       Radiology Studies: DG Ankle Right Port  Result Date: 08/15/2020 CLINICAL DATA:  Right ankle pain. EXAM: PORTABLE RIGHT ANKLE - 2 VIEW COMPARISON:  Lower leg radiographs 07/10/2020. FINDINGS: AP and lateral views obtained portably. There is no evidence of acute fracture or dislocation. There is suspected bone rarefaction of the distal tibial cortex medially with questionable overlying soft tissue irregularity. No evidence of foreign body or soft tissue emphysema. The ankle joint spaces are preserved, and no significant joint effusion is seen. Mild midfoot degenerative changes are present. IMPRESSION: Possible early  changes of osteomyelitis involving the distal tibial cortex medially. No acute osseous findings or significant arthropathic changes. Correlate clinically and consider MRI for further evaluation. Electronically Signed   By: Carey Bullocks M.D.   On: 08/15/2020 13:52        Scheduled Meds: . Chlorhexidine Gluconate Cloth  6 each Topical Daily  . docusate  100 mg Per Tube Daily  . enoxaparin (LOVENOX) injection  40 mg Subcutaneous Q24H  . feeding supplement (KATE FARMS STANDARD 1.4)  325 mL Oral BID BM  . Ferrous Fumarate  1 tablet Oral BID  . free water  20 mL Per Tube Q6H  . insulin aspart  0-9 Units Subcutaneous Q4H  . lactulose  20 g Oral BID  . liver oil-zinc oxide   Topical BID  . megestrol  400 mg Oral Daily  . melatonin  5 mg Oral QHS  . metoprolol tartrate  12.5 mg Per Tube BID  . modafinil  100 mg Oral Daily  . multivitamin  15 mL Per Tube Daily  . pantoprazole sodium  40 mg Per Tube Daily  . rosuvastatin  20 mg Oral Daily  . sodium chloride flush  3 mL Intravenous Q12H  . sodium chloride  1 g Oral BID WC  . tamsulosin  0.4 mg Oral QPC supper  . thiamine  100 mg Oral Daily   Continuous Infusions: . sodium chloride 10 mL/hr at 07/30/20 0400  . feeding supplement (VITAL AF 1.2 CAL) 1,000 mL (08/15/20 0850)     LOS: 34 days    Time spent: 28 minutes    Marrion Coy, MD [facility]   To contact the attending provider between 7A-7P or the covering provider during after hours 7P-7A, please log into the web site [email or web address] and access using universal [place] password for that web site. If you do not have the password, please call the hospital operator.  08/16/2020, 10:58 AM

## 2020-08-16 NOTE — Progress Notes (Signed)
Physical Therapy Treatment Patient Details Name: Casey Baldwin MRN: [id number] DOB: 1941-06-30 Today's Date: 08/16/2020    History of Present Illness Mckayla Mulcahey is a 79 y.o. 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  Presented to ER secondary to generalized body aches, progressive weakness, headaches; admitted for management of SIRS (urine?), shingles and acute metabolic encephalopathy.    PT Comments    Pt resting in bed upon PT arrival; pt's daughter present.  2 assist required for bed mobility and initial sitting balance but once positioned sitting edge of bed pt requiring min to mod assist x1 plus close SBA of second for sitting balance (PT in front of pt and PT aide behind pt); close SBA x2 briefly for sitting balance with cueing; during session pt's daughter sat next to pt on pt's L side and started assisting pt as well providing support and encouragement to her mom.  Focused on cueing for sitting balance (cueing for leaning more forward and UE placement--pt tending to use R UE more for support for sitting balance), cueing for upright posture (pt with head positioned looking more down--pt able to lift head a little but not fully); and sitting reaching for therapists and daughter's hands (pt requiring more vc's and tactile cueing for reaching with L hand; required assist to shift weight towards L side and then cueing to reach with R hand).  Will continue to focus on strengthening and progressive functional mobility per pt tolerance.  Pt's R knee noted to be swollen and painful--nurse notified who reported she would notify MD.   Follow Up Recommendations  SNF     Equipment Recommendations  Rolling walker with 5" wheels;3in1 (PT);Wheelchair (measurements PT);Wheelchair cushion (measurements PT);Other (comment) (hoyer lift)    Recommendations for Other Services OT consult     Precautions /  Restrictions Precautions Precautions: Fall Restrictions Weight Bearing Restrictions: No Other Position/Activity Restrictions: NG tube; HOB >30 degrees; aspiration; prevalon boots    Mobility  Bed Mobility Overal bed mobility: Needs Assistance Bed Mobility: Supine to Sit;Sit to Supine     Supine to sit: Max assist;+2 for physical assistance;HOB elevated Sit to supine: Max assist;+2 for physical assistance;HOB elevated   General bed mobility comments: assist for trunk and B LE's semi-supine to/from sitting edge of bed  Transfers                 General transfer comment: Deferred d/t pt requiring assist for sitting balance  Ambulation/Gait                 Stairs             Wheelchair Mobility    Modified Rankin (Stroke Patients Only)       Balance Overall balance assessment: Needs assistance Sitting-balance support: Bilateral upper extremity supported;Feet supported Sitting balance-Leahy Scale: Poor Sitting balance - Comments: pt able to sit on edge of bed for short/brief periods of time with B UE support but other times requiring min to mod assist for sitting balance d/t posterior lean Postural control: Posterior lean                                  Cognition Arousal/Alertness: Awake/alert Behavior During Therapy: Flat affect Overall Cognitive Status: Difficult to assess Area of Impairment: Attention;Following commands;Problem solving  Current Attention Level: Selective;Alternating   Following Commands: Follows one step commands inconsistently     Problem Solving: Slow processing;Decreased initiation;Difficulty sequencing;Requires verbal cues;Requires tactile cues        Exercises  Sitting balance; upright posture; sitting reaching with B UE's    General Comments General comments (skin integrity, edema, etc.): NG tube in place.  Nursing cleared pt for participation in physical therapy.  Pt and pt's  daughter agreeable to PT session.      Pertinent Vitals/Pain Pain Assessment: Faces Faces Pain Scale: No hurt Pain Location: R knee Pain Descriptors / Indicators: Grimacing (yelling out with movement of R LE) Pain Intervention(s): Limited activity within patient's tolerance;Monitored during session;Repositioned  Vitals (HR and O2 on room air) stable and WFL throughout treatment session.    Home Living                      Prior Function            PT Goals (current goals can now be found in the care plan section) Acute Rehab PT Goals Patient Stated Goal: to improve strength and mobility PT Goal Formulation: With family Time For Goal Achievement: 08/27/20 Potential to Achieve Goals: Fair Progress towards PT goals: Progressing toward goals    Frequency    Min 2X/week      PT Plan Current plan remains appropriate    Co-evaluation              AM-PAC PT "6 Clicks" Mobility   Outcome Measure  Help needed turning from your back to your side while in a flat bed without using bedrails?: Total Help needed moving from lying on your back to sitting on the side of a flat bed without using bedrails?: Total Help needed moving to and from a bed to a chair (including a wheelchair)?: Total Help needed standing up from a chair using your arms (e.g., wheelchair or bedside chair)?: Total Help needed to walk in hospital room?: Total Help needed climbing 3-5 steps with a railing? : Total 6 Click Score: 6    End of Session Equipment Utilized During Treatment: Oxygen Activity Tolerance: Patient limited by fatigue Patient left: in bed;with call bell/phone within reach;with bed alarm set;Other (comment);with family/visitor present;with SCD's reapplied (B prevalon boots in place) Nurse Communication: Mobility status;Precautions PT Visit Diagnosis: Muscle weakness (generalized) (M62.81);Difficulty in walking, not elsewhere classified (R26.2);Other abnormalities of gait and  mobility (R26.89)     Time: 7412-8786 PT Time Calculation (min) (ACUTE ONLY): 30 min  Charges:  $Therapeutic Activity: 23-37 mins                    Hendricks Limes, PT 08/16/20, 4:26 PM

## 2020-08-16 NOTE — Progress Notes (Signed)
Daily Progress Note   Patient Name: Casey Baldwin       Date: 08/16/2020 DOB: 1941/06/25  Age: 79 y.o. MRN#: [id number] Attending Physician: Marrion Coy, MD Primary Care Physician: Lesly Rubenstein, MD Admit Date: 07/12/2020  Reason for Consultation/Follow-up: Establishing goals of care  Subjective: Patient resting in bed. Awakens to verbal response but somewhat somnolent. Confused does not answer questions appropriately. Appetite remains poor. Currently on calorie count.   No family at the bedside. I spoke with Darl Pikes via phone and provided updates and support. We reviewed previous goals of care discussions. Darl Pikes verbalized appreciation of care and attentiveness to their needs by current medical team/providers.   We discussed artificial feeding/PEG given recent evaluation by Surgeon. Darl Pikes and her sister verbalized understanding of risk for PEG and are clear in their wishes at this time they do not wish to consider PEG. They are remaining hopeful that patient's appetite will improve and this will not be of further concern, however are also preparing for the worst (continued poor appetite). Darl Pikes again speaks to patient's current poor quality of life and knowing her mother would not wish to live in this current state pro-longed. She shares family is continuing in discussions with focus on quality over quantity and what is best for patient despite their own personal feelings. Support provided.   Darl Pikes reports [place] (patient's granddaughter) has shared with family that if patient does not improve that comfort care would be most reasonable knowing patients values on life. Darl Pikes asked appropriate questions regarding comfort versus aggressive interventions.   I created space and opportunity to discuss patient's full code status with consideration to her current illness and co-morbidities. Darl Pikes reports patient would not want to receive heroic measures in the event of an emergency. Darl Pikes reports  patient has a completed advanced directive which she completed during her stay at [facility] several months ago. Darl Pikes is listed as patients HCPOA and patient was clear in expressing her wishes to family and in her document for DNR/DNI. Darl Pikes is requesting patient be a DNR with awareness this was her mother's wishes.   Family continues to remain hopeful for some improvement with understanding this may take some times and will not be seen overnight. They are clear no PEG tube. If patient does not improve or further declines family states they would then be more often to focusing on patient's comfort.   Family requesting literature on Palliative vs. Hospice which will be left at the bedside as requested.   Length of Stay: 34 days  Vital Signs: BP 137/69 (BP Location: Left Arm)   Pulse 94   Temp 98.4 F (36.9 C)   Resp 18   Ht 5\' 4"  (1.626 m)   Wt 77.3 kg   SpO2 100%   BMI 29.25 kg/m  SpO2: SpO2: 100 % O2 Device: O2 Device: Nasal Cannula O2 Flow Rate: O2 Flow Rate (L/min): 2 L/min  Physical Exam: -somewhat somnolent, will awaken to verbal stimuli, ill-appearing -normal breathing pattern -dobbhof tube in place         Palliative Care Assessment & Plan  HPI: Palliative Care consult requested for goals of care discussion in this 79 y.o. female with multiple medical problems including Chronicdiastolic CHF, hyperlipidemia, obstructive sleep apnea, hypertension, Roux-en-Y gastric bypass (2011), and diabetes type 2.  Patient presented to the ED with complaints of body aches, headaches, and shortness of breath.  Family reported home temperature of 102.2.  During work-up UA showed UTI.  Sed rate elevated.  Since  admission patient had LP (9/1 4) with findings consistent with viral meningitis/encephalitis.  Patient treated with IV acyclovir and has completed course.  Continues to be somewhat lethargic with intermittent confusion.  Patient has been followed by neurology.  EEG did not show any seizure  activity.  Patient required Dobbhoff placement (07/23/20) tube feedings have been initiated.  Continues to have poor appetite but with some gradual improvement.  Code Status:  DNR  Goals of Care/Recommendations:  DNR/DNI as requested and confirmed by Rondel Oh with current medical plan per medical team  NO PEG   Family remains hopeful for some improvement, however if no improvement or further decline they would be more open to focusing on her comfort.   PMT will continue to support and follow.   Prognosis: Guarded   Discharge Planning: To Be Determined  Thank you for allowing the Palliative Medicine Team to assist in the care of this patient.  Time Total: 50 min.   Visit consisted of counseling and education dealing with the complex and emotionally intense issues of symptom management and palliative care in the setting of serious and potentially life-threatening illness.Greater than 50%  of this time was spent counseling and coordinating care related to the above assessment and plan.  Willette Alma, AGPCNP-BC  Palliative Medicine Team [phone]

## 2020-08-17 ENCOUNTER — Inpatient Hospital Stay: Payer: Medicare Other

## 2020-08-17 DIAGNOSIS — R339 Retention of urine, unspecified: Secondary | ICD-10-CM | POA: Diagnosis not present

## 2020-08-17 DIAGNOSIS — B029 Zoster without complications: Secondary | ICD-10-CM | POA: Diagnosis not present

## 2020-08-17 DIAGNOSIS — R079 Chest pain, unspecified: Secondary | ICD-10-CM | POA: Diagnosis not present

## 2020-08-17 DIAGNOSIS — R410 Disorientation, unspecified: Secondary | ICD-10-CM | POA: Diagnosis not present

## 2020-08-17 LAB — GLUCOSE, CAPILLARY
Glucose-Capillary: 138 mg/dL — ABNORMAL HIGH (ref 70–99)
Glucose-Capillary: 143 mg/dL — ABNORMAL HIGH (ref 70–99)
Glucose-Capillary: 129 mg/dL — ABNORMAL HIGH (ref 70–99)
Glucose-Capillary: 135 mg/dL — ABNORMAL HIGH (ref 70–99)
Glucose-Capillary: 113 mg/dL — ABNORMAL HIGH (ref 70–99)

## 2020-08-17 MED ORDER — VITAL AF 1.2 CAL PO LIQD
720.0000 mL | ORAL | Status: DC
  Administered 2020-08-17: 720 mL

## 2020-08-17 NOTE — Progress Notes (Signed)
Date of Admission:  07/12/2020     ID: Casey Baldwin is a 79 y.o. female  Active Problems:   SIRS (systemic inflammatory response syndrome) (HCC)   Shingles rash   Delirium   Acute metabolic encephalopathy   Essential hypertension   Anemia   Acute lower UTI   Chronic diastolic CHF (congestive heart failure) (HCC)   Sepsis (HCC)   Change in mental status   Anorexia   Malnutrition of moderate degree   Hyponatremia   Obstructive sleep apnea   PSVT (paroxysmal supraventricular tachycardia) (HCC)   Thrombocytosis   Acute urinary retention Zoster meningoencephalitis   Subjective: More alert Answers to some questions Able to look at pictures and identify  Medications:  . Chlorhexidine Gluconate Cloth  6 each Topical Daily  . docusate  100 mg Per Tube Daily  . enoxaparin (LOVENOX) injection  40 mg Subcutaneous Q24H  . feeding supplement (KATE FARMS STANDARD 1.4)  325 mL Oral BID BM  . feeding supplement (VITAL AF 1.2 CAL)  360 mL Per Tube Q24H  . Ferrous Fumarate  1 tablet Oral BID  . free water  20 mL Per Tube Q6H  . insulin aspart  0-9 Units Subcutaneous Q4H  . lactulose  20 g Oral BID  . liver oil-zinc oxide   Topical BID  . megestrol  400 mg Oral Daily  . melatonin  5 mg Oral QHS  . metoprolol tartrate  12.5 mg Per Tube BID  . modafinil  100 mg Oral Daily  . multivitamin  15 mL Per Tube Daily  . pantoprazole sodium  40 mg Per Tube Daily  . rosuvastatin  20 mg Oral Daily  . sodium chloride flush  3 mL Intravenous Q12H  . sodium chloride  1 g Oral BID WC  . tamsulosin  0.4 mg Oral QPC supper  . thiamine  100 mg Oral Daily    Objective: Vital signs in last 24 hours: Temp:  [98.3 F (36.8 C)-98.6 F (37 C)] 98.6 F (37 C) (10/08 0738) Pulse Rate:  [87-93] 93 (10/08 0738) Resp:  [16-18] 18 (10/08 0738) BP: (124-139)/(65-73) 139/65 (10/08 0738) SpO2:  [100 %] 100 % (10/08 0738)  PHYSICAL EXAM:  General: More Alert, , no distress, appears stated age.  Head:  Normocephalic, without obvious abnormality, atraumatic. Eyes: Conjunctivae clear, anicteric sclerae. Pupils are equal ENT Dobbhoff tube Neck: some stiffness no carotid bruit and no JVD. Lungs: b/l air entry. Heart: s1s2 Abdomen: Soft, abdominal wall  Hernia  Extremities: does not move her legs- stiffSkin: No rashes or lesions. Or bruising Lymph: Cervical, supraclavicular normal. Neurologic: cannot be assessed Moves upper extremities well Wiggles her toes Stiffness and pain prevents rest of the movt in the lower limbs  Lab Results Recent Labs    08/15/20 0257 08/16/20 0256  WBC 9.0  --   HGB 8.6*  --   HCT 27.3*  --   NA 141 136  K 3.9 3.9  CL 105 99  CO2 28 28  BUN 36* 31*  CREATININE 0.42* 0.46   Liver Panel Recent Labs    08/15/20 0257  PROT 6.3*  ALBUMIN 2.5*  AST 110*  ALT 228*  ALKPHOS 57  BILITOT 0.4  BILIDIR <0.1  IBILI NOT CALCULATED   Studies/Results: MR ANKLE RIGHT WO CONTRAST  Result Date: 08/17/2020 CLINICAL DATA:  Body aches, right knee swelling, right ankle pain EXAM: MRI OF THE RIGHT ANKLE WITHOUT CONTRAST TECHNIQUE: Multiplanar, multisequence MR imaging of the ankle was performed.  No intravenous contrast was administered. COMPARISON:  Radiographs from 08/15/2020 FINDINGS: TENDONS Peroneal: Common peroneus and peroneus brevis tenosynovitis. Posteromedial: Mild tibialis posterior, flexor digitorum longus, and flexor hallucis longus tenosynovitis along with mild to moderate distal tibialis posterior tendinopathy. Correlate clinically in assessing for tibialis posterior dysfunction. Anterior: Unremarkable Achilles: Mild fusiform thickening of the distal Achilles tendon with mildly increased striated accentuated longitudinal T2 signal compatible with tendinopathy. Plantar Fascia: Mildly thickened medial band of the plantar fascia with accentuated internal signal as shown for example on image 28 of series 6, compatible with mild plantar fasciitis. LIGAMENTS  Lateral: Borderline thickened appearance of the calcaneonavicular ligament. Medial: Mild thickening of the superomedial portion of the spring ligament. CARTILAGE Ankle Joint: Mild degenerative chondral thinning with minimal marginal spurring. 5 mm non-fragmented osteochondral lesion or degenerative subcortical cyst of the posteromedial talar dome (image 13, series 5). Subtalar Joints/Sinus Tarsi: Small effusion of the posterior subtalar facet for example on image 8 of series 8. Mild degenerative chondral thinning. Bones: No abnormal signal in the distal tibia at the site of questionable prior demineralization on radiography. No findings of osteomyelitis involving the ankle. Moderate midfoot spurring especially dorsally. Mild to moderate degenerative findings along the Lisfranc joint degenerative subcortical cyst or geode proximally in the dorsal medial cuneiform and in the adjacent portion of the navicular as on image 20 of series 5. Other: No abscess is observed. IMPRESSION: 1. No findings of osteomyelitis or abscess. 2. Mild plantar fasciitis. 3. Mild tibialis posterior, flexor digitorum longus, and flexor hallucis longus tenosynovitis along with mild to moderate distal tibialis posterior tendinopathy. Correlate clinically in assessing for tibialis posterior dysfunction. 4. Mild thickening of the superomedial portion of the spring ligament. 5. 5 mm non-fragmented osteochondral lesion or degenerative subcortical cyst of the posteromedial talar dome. Mild degenerative chondral thinning in the tibiotalar joint and subtalar joint. Moderate midfoot spurring. 6. Small effusion of the posterior subtalar facet. 7. Mild to moderate degenerative findings along the Lisfranc joint. Electronically Signed   By: Gaylyn Rong M.D.   On: 08/17/2020 07:29   DG Ankle Right Port  Result Date: 08/15/2020 CLINICAL DATA:  Right ankle pain. EXAM: PORTABLE RIGHT ANKLE - 2 VIEW COMPARISON:  Lower leg radiographs 07/10/2020.  FINDINGS: AP and lateral views obtained portably. There is no evidence of acute fracture or dislocation. There is suspected bone rarefaction of the distal tibial cortex medially with questionable overlying soft tissue irregularity. No evidence of foreign body or soft tissue emphysema. The ankle joint spaces are preserved, and no significant joint effusion is seen. Mild midfoot degenerative changes are present. 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 No acute osseous findings or significant arthropathic changes. Correlate clinically and consider MRI for further evaluation. Electronically Signed   By: Carey Bullocks M.D.   On: 08/15/2020 13:52     Assessment/Plan: Hypoactive delirium following VZV meningoencephalitis - fluctuating level of alertness- overall improving alertness in the past week. Periods of lucidity and interaction with somnolence. She needs constant stimulation and activity Need PT/OT/speech, OOB, breathing exercise,Regular bowel movts, Na optimization andc[facility]  Admitted on 9/12 with chest pain rt side, fever, headache and leg painandintermittent confusion  Found to have herpes zoster Thoracic dermatome  Herpes zoster with aseptic meningitis on admission ( patient had refused LPinitially)- was started on IV acyclovir 10mg /Kg q 8. She was alert and verbal and appropriate on presentation but after 5-6 days gradual deterioration to obtundation. Seen by neuro- delirium was  questioned -EEG on 07/20/20 no seizure activity..There were many reasons including changes in sleep rhythm, poor intake, constipation, not wearing CPAp ( but no co2 narcosis) hyponatremia for the obtundation On 9/14 Had LP which revealed lymphocytic pleocytosis bacterial culture neg and HSV DNA neg- VZVneg .VzV ig G very high> 4000 west Nile virus IgG in csf was positive but not IgM and serum antibodies neg- so this isless  likelyacute WNV and IgG is not a reliable test for diagnosis of acute infection as there is cross reactivity with other flavi virus and dengue-There is no treatment for WNV.  MRI and MRA done on 9/14 no acute changes. Daughter wanted her to be in [place], but no bed andapparently [facility] has declined [facility] transfer Repeat EEG done on 07/25/20 no seizure activity noted. keppra stopped So the diagnosis remains VZV inducedmeningoencephalitis) Completed 21 days of acycolvir threapy for VZV induced meningo encephalitison 08/02/20  waxing and waning mentalstatussince earlier in admission  Urinary retention -9/12- has foley-failed voiding trial on 9/28 and reinserted 9/29  Stiffness and pain lower extremities and neck due to immobility ( Xray and MRI of the rt leg- nothing acute)  Rt sided abdominal firmness and fullness CT abdomen showedFluid collectionpseudocyst  Complicated stay at [facility] between 04/01/20 until 05/14/20 for abdominal pain which was diagnosed as choledocholithiasis/cholangitis in a setting of prior RYGB in 2008. 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 and she was taken to [facility]/20 for diagnostic lap, robotic lysis of adhesions, transgastric ERCP and biliary sphincterotomy, cholecystectomy, partial gastrectomy andaccidentalcolotomy which was repaired.The immediate post op was complicated by post ERCP pancreatitis , pulmonary edema with acute hypoxic resp failure , Afib ( treated with amio and metoprolol), fever and leucocytosis suspected due to pancreatitis and was treated with zosyn, UTI due to proteus and acute on chronic anemia needing PRBC and AKI.She was seen by gerontologist for insomnia and risk for delirium   H/o left TKA  Hearing loss   Discussed the management with care team ID will follow her remotely this weekend

## 2020-08-17 NOTE — Progress Notes (Signed)
Nutrition Follow-up  DOCUMENTATION CODES:   Non-severe (moderate) malnutrition in context of chronic illness  INTERVENTION:  Provide nocturnal tube feed regimen: -Vital AF 1.2 Cal at 60 mL/hr x 12 hours from 2000-0800 (720 mL goal volume) -Provides 864 kcal (51% minimum estimated kcal needs), 54 grams of protein (60% minimum estimated protein needs), 605 mL H2O daily -Continue free water flush of 20 mL Q6hrs to maintain tube patency  Continue Dillard [place] Standard 1.4 Vanilla po BID, each supplement provides 455 kcal and 20 grams of protein.  Continue calorie count to assist in weaning tube feeds. RD will follow-up after the weekend to document results.  NUTRITION DIAGNOSIS:   Moderate Malnutrition related to chronic illness (cholecystitis s/p LOA, transgastric ERCP and biliary sphincterotomy, cholecystectomy, partial gastrectomy, and colotomy 5/27, hx Roux-en-Y gastric bypass, CHF) as evidenced by mild fat depletion, mild muscle depletion, moderate muscle depletion, percent weight loss.  Ongoing.  GOAL:   Patient will meet greater than or equal to 90% of their needs  Progressing.  MONITOR:   PO intake, Labs, Weight trends, TF tolerance, I & O's  REASON FOR ASSESSMENT:   Consult Enteral/tube feeding initiation and management  ASSESSMENT:   79 y/o female with h/o CHF, HLD, HTN, OSA, DM, Roux-en-y 2010, Lap band 2008, cholecystitis s/p (diagnostic laparoscopy, robotic lysis of adhesions, transgastric ERCP and biliary sphincterotomy, cholecystectomy, partial gastrectomy and colotomy 5/27) and colon resection for diverticulitis who is admitted with UTI, SIRS, shingles and encephalitis  9/10 per IR no indication for drain placement for stable pancreatic pseudocyst 9/11 NGT placed but plan was to try to adv and wait until AM for repeat x-ray 9/12 TFs started at 20 mL/hr but tube later dislodged; tube was advanced, repeat x-ray taken and TFs resumed at 20 mL/hr 9/12 feeding tube  became clogged 9/13 s/p placement of 10 Fr. Dobbhoff tube under fluoroscopic guidance that terminates in proximal portion of gastric remnant (could not be advanced further) 9/14 s/p LP 9/16 s/p EEG with findings suggestive of moderate diffuse encephalopathy 9/17 s/p EEG with findings suggestive of moderate diffuse encephalopathy  Calorie count has been completed this week. On day 1 patient met 57% kcal needs and 46% protein needs. On day 2 patient met 74% kcal needs and 74% protein needs. Tube feeds were changed to overnight and reduced to meet approximately 25% of patient's needs. However, today patient now only met 26% kcal needs and 27% protein needs. Patient resting comfortably in bed at time of RD assessment. Spoke with patient's daughter over the phone. She reports she is concerned that patient did not receive assistance at meals and that is why she ate less. Plan is to leave tube feeds infusing overnight but increase rate so patient is back to meeting 50% of her needs from tube feeds. Daughter would not like rate to exceed 60 mL/hr at this time.  Medications reviewed and include: Colace 100 mg daily per tube, ferrous fumarate 1 tablet BID, Novolog 0-9 units Q4hrs, lactulose 20 grams BID, Megace 400 mg daily, Protonix, thiamine 100 mg daily per tube.  Labs reviewed: CBG 129-143.  Enteral Access: 10 Fr. DHT placed in IR on 9/13; per abdominal x-ray 10/1 tip is in gastric cardia  Discussed with RN. Patient is receiving assistance at meals but was not as hungry today.  Discussed with MD via secure chat.  Diet Order:   Diet Order            DIET - DYS 1 Room service appropriate? Yes  with Assist; Fluid consistency: Thin  Diet effective now                EDUCATION NEEDS:   No education needs have been identified at this time  Skin:  Skin Assessment: Reviewed RN Assessment  Last BM:  08/17/2020 -  medium type 5  Height:   Ht Readings from Last 1 Encounters:  07/12/20 '5\' 4"'  (1.626  m)   Weight:   Wt Readings from Last 1 Encounters:  08/16/20 77.3 kg   Ideal Body Weight:  54.5 kg  BMI:  Body mass index is 29.25 kg/m.  Estimated Nutritional Needs:   Kcal:  1700-1900kcal/day  Protein:  90-105 grams  Fluid:  1.6L/day  Jacklynn Barnacle, MS, RD, LDN Pager number available on Amion

## 2020-08-17 NOTE — Progress Notes (Signed)
Physical Therapy Treatment Patient Details Name: Casey Baldwin MRN: [id number] DOB: 1941-03-12 Today's Date: 08/17/2020    History of Present Illness Casey Baldwin is a 79 y.o. 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  Presented to ER secondary to generalized body aches, progressive weakness, headaches; admitted for management of SIRS (urine?), shingles and acute metabolic encephalopathy.    PT Comments    Patient received in bed, no family present at bedside. She tells me her name when asked, but otherwise does not respond to questions or direction. She tolerated LE exercises but grimacing and yelling out in pain at times. She will continue to benefit from skilled PT while here to attempt to improve functional independence and strength.     Follow Up Recommendations  SNF;Supervision/Assistance - 24 hour     Equipment Recommendations  Other (comment) (TBD)    Recommendations for Other Services       Precautions / Restrictions Precautions Precautions: Fall Restrictions Weight Bearing Restrictions: No Other Position/Activity Restrictions: NG tube; HOB >30 degrees; aspiration; prevalon boots    Mobility  Bed Mobility               General bed mobility comments: did not attempt as patient yelling out with minimal BLE movement  Transfers                 General transfer comment: deferred  Ambulation/Gait                 Stairs             Wheelchair Mobility    Modified Rankin (Stroke Patients Only)       Balance       Sitting balance - Comments: not assessed this visit                                    Cognition Arousal/Alertness: Awake/alert Behavior During Therapy: Flat affect Overall Cognitive Status: Difficult to assess Area of Impairment: Orientation;Attention;Following commands;Problem solving                  Orientation Level: Disoriented to;Place;Time;Situation Current Attention Level: Sustained Memory: Decreased recall of precautions;Decreased short-term memory Following Commands: Follows one step commands inconsistently Safety/Judgement: Decreased awareness of safety;Decreased awareness of deficits Awareness: Intellectual Problem Solving: Slow processing;Decreased initiation;Difficulty sequencing;Requires verbal cues;Requires tactile cues General Comments: Patient initially responding to me, told me her name, otherwise would not respond when asked questions. Did not respond to perform activities.      Exercises Other Exercises Other Exercises: Passive LE exercises. AP, heel slides, hip abd/add x 10 reps each with patient grimacing and yelling out in pain.    General Comments        Pertinent Vitals/Pain Pain Assessment: Faces Faces Pain Scale: Hurts whole lot Pain Location: B LE with movement Pain Descriptors / Indicators: Grimacing;Discomfort;Moaning;Other (Comment) (yelling out) Pain Intervention(s): Monitored during session;Limited activity within patient's tolerance;Repositioned    Home Living                      Prior Function            PT Goals (current goals can now be found in the care plan section) Acute Rehab PT Goals Patient Stated Goal: none stated, no family present PT Goal Formulation: Patient unable to participate in goal setting  Time For Goal Achievement: 08/27/20 Potential to Achieve Goals: Fair Progress towards PT goals: Not progressing toward goals - comment (patient limited by pain)    Frequency    Min 2X/week      PT Plan Current plan remains appropriate    Co-evaluation              AM-PAC PT "6 Clicks" Mobility   Outcome Measure  Help needed turning from your back to your side while in a flat bed without using bedrails?: Total Help needed moving from lying on your back to sitting on the side of a flat bed without using  bedrails?: Total Help needed moving to and from a bed to a chair (including a wheelchair)?: Total Help needed standing up from a chair using your arms (e.g., wheelchair or bedside chair)?: Total Help needed to walk in hospital room?: Total Help needed climbing 3-5 steps with a railing? : Total 6 Click Score: 6    End of Session Equipment Utilized During Treatment: Oxygen Activity Tolerance: Patient limited by pain;Patient limited by fatigue Patient left: in bed;with call bell/phone within reach;with bed alarm set Nurse Communication: Mobility status PT Visit Diagnosis: Muscle weakness (generalized) (M62.81);Other abnormalities of gait and mobility (R26.89);Pain Pain - Right/Left:  (bilateral) Pain - part of body: Leg     Time: 1345-1400 PT Time Calculation (min) (ACUTE ONLY): 15 min  Charges:  $Therapeutic Exercise: 8-22 mins                     Casey Baldwin, PT, GCS 08/17/20,2:20 PM

## 2020-08-17 NOTE — Progress Notes (Signed)
Calorie Count Note  Calorie count ordered. Day 3 results:  Diet: dysphagia 1 with thin liquids Supplements: Dillard [place] Standard 1.4 Vanilla po BID, each supplement provides 455 kcal and 20 grams of protein.  Dinner 10/7: 91 kcal, 8 grams of protein (25% puree chicken with gravy, 25% puree rice, 100% unsweet tea) Breakfast 10/8: 172 kcal, 10 grams of protein (50% scrambled eggs, 25% vanilla Magic Cup) Lunch 10/8: 73 kcal, 2 grams of protein (25% orange Magic Cup) Supplements: 114 kcal, 5 grams of protein (25% [employer])  Total intake Day 3: 450 kcal (26% of minimum estimated needs)  25 grams of protein (27% of minimum estimated needs)  Estimated Nutritional Needs: Kcal:1700-1900kcal/day Protein:90-105 grams Fluid:1.6L/day  Nutrition Dx: Moderate Malnutritionrelated to chronic illness (cholecystitis s/p LOA, transgastric ERCP and biliary sphincterotomy, cholecystectomy, partial gastrectomy, and colotomy 5/27, hx Roux-en-Y gastric bypass, CHF)as evidenced by mild fat depletion, mild muscle depletion, moderate muscle depletion, percent weight loss.  Goal: Patient will meet greater than or equal to 90% of their needs  Intervention:  -After discussing with patient's daughter plan is to increase TF regimen to met 50% of her needs while still leaving overnight:     -Vital AF 1.2 Cal at 60 mL/hr over 12 hours from 2000-0800     -Continue free water flush of 20 mL Q6hrs to maintain tube patency -Continue Dillard [place] Standard 1.4 Vanilla po BID, each supplement provides 455 kcal and 20 grams of protein -Extend calorie count to continue to monitor PO intake. Receipts will be reviewed after the weekend.  Jacklynn Barnacle, MS, RD, LDN Pager number available on Amion

## 2020-08-17 NOTE — Progress Notes (Signed)
PROGRESS NOTE    Casey Baldwin  YCX:[id number] DOB: January 06, 1941 DOA: 07/12/2020 PCP: Annice Needy, MD   Chief complaint.  Altered mental status. 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 Rash is intensely pruritic different stages of healing some areas of vesicular be red border. Some areas covered in black eschars. Skin is diffusely tender.Per family (asked by me) pt developed confusion. Was c/o side to back pain.  Sed rate was elevated. LP was considered but patient declined stating that she had a history of meningitis in the past and very scared of having LP done. Hospitalist was called for admission for shingles and possible encephalitis.  Patient was treated with IV acyclovir, she has so far completed the whole course. Due to poor p.o. intake, a Dobbhoff was placed on 9/13, tube feeding started. LP was performed on 9/14. Condition consistent with viral meningitis/encephalitis. EEG did not show any seizure activity. MRI of the brain without contrast on 9/14 showed nonspecific white matter changes, no large vessel occlusion on MRI of the brain.  Patient condition has been gradually improving, completed 21 days of IV acyclovir.  Assessment & Plan:   Active Problems:   SIRS (systemic inflammatory response syndrome) (HCC)   Shingles rash   Delirium   Acute metabolic encephalopathy   Essential hypertension   Anemia   Acute lower UTI   Chronic diastolic CHF (congestive heart failure) (HCC)   Sepsis ([place])   Change in mental status   Anorexia   Malnutrition of moderate  degree   Hyponatremia   Obstructive sleep apnea   PSVT (paroxysmal supraventricular tachycardia) (HCC)   Thrombocytosis   Acute urinary retention  #1.  Acute metabolic encephalopathy. 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 Patient condition is gradually improving, but slowly. Currently, patient still has significant effusion, sleepiness.  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  2.  Anorexia. Patient hepatitis seems to be improving.  Spoke with nutritionist, patient had met 70% of calorie requirement yesterday, tube feeding is changed to every night. Continue megestrol.  #3.  Right foot pain. Chest x-ray initially suspect osteomyelitis, MRI ruled out.  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  No infection source.  4.  Urinary retention. Continue Foley catheter and continue Flomax.  5.  Shingles with aseptic meningitis, encephalitis. Acyclovir completed.  6 anemia of chronic disease. Follow.    DVT prophylaxis: Home Code Status: SNF Family Communication: Daughter updated.      Status is: Inpatient  Remains inpatient appropriate because:Inpatient level of care appropriate due to severity of illness.  We will not be able to discharge patient and to Dobbhoff is removed.    Dispo: The patient is from: Home              Anticipated d/c is to: SNF              Anticipated d/c date is: 3 days              Patient currently is not medically stable to d/c.        I/O last 3 completed shifts: In: -  Out:  1600 [Urine:1600] No intake/output data recorded.     Consultants:   None  Procedures: Dobbhoff tube feeding  Antimicrobials: None  Subjective: Patient is very sleepy.  But she is more oriented today, it is the first time she knew she is in the hospital.  No headache or dizziness per Still complaining of a pain in her right foot, Not short of breath or cough. No fever or chills. No abdominal pain, no nausea  vomiting.  Objective: Vitals:   08/16/20 0750 08/16/20 1526 08/16/20 2331 08/17/20 0738  BP: 137/69 134/65 124/73 139/65  Pulse: 94 93 87 93  Resp: '18 18 16 18  ' Temp: 98.4 F (36.9 C) 98.3 F (36.8 C) 98.3 F (36.8 C) 98.6 F (37 C)  TempSrc:  Oral    SpO2: 100% 100% 100% 100%  Weight:      Height:        Intake/Output Summary (Last 24 hours) at 08/17/2020 1012 Last data filed at 08/17/2020 0453 Gross per 24 hour  Intake --  Output 1050 ml  Net -1050 ml   Filed Weights   08/14/20 0500 08/15/20 0647 08/16/20 0639  Weight: 75.7 kg 75.3 kg 77.3 kg    Examination:  General exam: Appears calm and comfortable  Respiratory system: Clear to auscultation. Respiratory effort normal. Cardiovascular system: S1 & S2 heard, RRR. No JVD, murmurs, rubs, gallops or clicks. No pedal edema. Gastrointestinal system: Abdomen is nondistended, soft and nontender. No organomegaly or masses felt. Normal bowel sounds heard. Central nervous system: Alert today, oriented to person and place.. No focal neurological deficits. Extremities: Symmetric . Skin: No rashes, lesions or ulcers Psychiatry: Mood & affect appropriate.     Data Reviewed: I have personally reviewed following labs and imaging studies  CBC: Recent Labs  Lab 08/15/20 0257  WBC 9.0  HGB 8.6*  HCT 27.3*  MCV 102.2*  PLT 654   Basic Metabolic Panel: Recent Labs  Lab 08/11/20 0421 08/13/20 0920 08/14/20 0912 08/15/20 0257 08/16/20 0256  NA 135 140 139 141 136  K  --   --   --  3.9 3.9  CL  --   --   --  105 99  CO2  --   --   --  28 28  GLUCOSE  --   --   --  152* 161*  BUN  --   --   --  36* 31*  CREATININE  --   --   --  0.42* 0.46  CALCIUM  --   --   --  9.3 9.0  MG  --   --   --   --  2.1  PHOS  --   --   --   --  3.7   GFR: Estimated Creatinine Clearance: 56.4 mL/min (by C-G formula based on SCr of 0.46 mg/dL). Liver Function Tests: Recent Labs  Lab 08/15/20 0257  AST 110*  ALT 228*  ALKPHOS 57   BILITOT 0.4  PROT 6.3*  ALBUMIN 2.5*   No results for input(s): LIPASE, AMYLASE in the last 168 hours. No results for input(s): AMMONIA in the last 168 hours. Coagulation Profile: No results for input(s): INR, PROTIME in the last 168 hours. Cardiac Enzymes: Recent Labs  Lab 08/15/20 0257  CKTOTAL 82   BNP (last 3 results) No results for input(s): PROBNP in the last 8760 hours. HbA1C: No results for input(s): HGBA1C in the last 72 hours. CBG: Recent Labs  Lab 08/16/20 1716 08/16/20 1958 08/16/20  2339 08/17/20 0517 08/17/20 0738  GLUCAP 105* 172* 131* 138* 143*   Lipid Profile: No results for input(s): CHOL, HDL, LDLCALC, TRIG, CHOLHDL, LDLDIRECT in the last 72 hours. Thyroid Function Tests: No results for input(s): TSH, T4TOTAL, FREET4, T3FREE, THYROIDAB in the last 72 hours. Anemia Panel: No results for input(s): VITAMINB12, FOLATE, FERRITIN, TIBC, IRON, RETICCTPCT in the last 72 hours. Sepsis Labs: No results for input(s): PROCALCITON, LATICACIDVEN in the last 168 hours.  No results found for this or any previous visit (from the past 240 hour(s)).       Radiology Studies: MR ANKLE RIGHT WO CONTRAST  Result Date: 08/17/2020 CLINICAL DATA:  Body aches, right knee swelling, right ankle pain EXAM: MRI OF THE RIGHT ANKLE WITHOUT CONTRAST TECHNIQUE: Multiplanar, multisequence MR imaging of the ankle was performed. No intravenous contrast was administered. COMPARISON:  Radiographs from 08/15/2020 FINDINGS: TENDONS Peroneal: Common peroneus and peroneus brevis tenosynovitis. Posteromedial: Mild tibialis posterior, flexor digitorum longus, and flexor hallucis longus tenosynovitis along with mild to moderate distal tibialis posterior tendinopathy. Correlate clinically in assessing for tibialis posterior dysfunction. Anterior: Unremarkable Achilles: Mild fusiform thickening of the distal Achilles tendon with mildly increased striated accentuated longitudinal T2 signal compatible  with tendinopathy. Plantar Fascia: Mildly thickened medial band of the plantar fascia with accentuated internal signal as shown for example on image 28 of series 6, compatible with mild plantar fasciitis. LIGAMENTS Lateral: Borderline thickened appearance of the calcaneonavicular ligament. Medial: Mild thickening of the superomedial portion of the spring ligament. CARTILAGE Ankle Joint: Mild degenerative chondral thinning with minimal marginal spurring. 5 mm non-fragmented osteochondral lesion or degenerative subcortical cyst of the posteromedial talar dome (image 13, series 5). Subtalar Joints/Sinus Tarsi: Small effusion of the posterior subtalar facet for example on image 8 of series 8. Mild degenerative chondral thinning. Bones: No abnormal signal in the distal tibia at the site of questionable prior demineralization on radiography. No findings of osteomyelitis involving the ankle. Moderate midfoot spurring especially dorsally. Mild to moderate degenerative findings along the Lisfranc joint degenerative subcortical cyst or geode proximally in the dorsal medial cuneiform and in the adjacent portion of the navicular as on image 20 of series 5. Other: No abscess is observed. IMPRESSION: 1. No findings of osteomyelitis or abscess. 2. Mild plantar fasciitis. 3. Mild tibialis posterior, flexor digitorum longus, and flexor hallucis longus tenosynovitis along with mild to moderate distal tibialis posterior tendinopathy. Correlate clinically in assessing for tibialis posterior dysfunction. 4. Mild thickening of the superomedial portion of the spring ligament. 5. 5 mm non-fragmented osteochondral lesion or degenerative subcortical cyst of the posteromedial talar dome. Mild degenerative chondral thinning in the tibiotalar joint and subtalar joint. Moderate midfoot spurring. 6. Small effusion of the posterior subtalar facet. 7. Mild to moderate degenerative findings along the Lisfranc joint. Electronically Signed   By:  Van Clines M.D.   On: 08/17/2020 07:29   DG Ankle Right Port  Result Date: 08/15/2020 CLINICAL DATA:  Right ankle pain. EXAM: PORTABLE RIGHT ANKLE - 2 VIEW COMPARISON:  Lower leg radiographs 07/10/2020. FINDINGS: AP and lateral views obtained portably. There is no evidence of acute fracture or dislocation. There is suspected bone rarefaction of the distal tibial cortex medially with questionable overlying soft tissue irregularity. No evidence of foreign body or soft tissue emphysema. The ankle joint spaces are preserved, and no significant joint effusion is seen. Mild midfoot degenerative changes are present. 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 No acute osseous findings or  significant arthropathic changes. Correlate clinically and consider MRI for further evaluation. Electronically Signed   By: Richardean Sale M.D.   On: 08/15/2020 13:52        Scheduled Meds:  Chlorhexidine Gluconate Cloth  6 each Topical Daily   docusate  100 mg Per Tube Daily   enoxaparin (LOVENOX) injection  40 mg Subcutaneous Q24H   feeding supplement ([place] STANDARD 1.4)  325 mL Oral BID BM   feeding supplement (VITAL AF 1.2 CAL)  360 mL Per Tube Q24H   Ferrous Fumarate  1 tablet Oral BID   free water  20 mL Per Tube Q6H   insulin aspart  0-9 Units Subcutaneous Q4H   lactulose  20 g Oral BID   liver oil-zinc oxide   Topical BID   megestrol  400 mg Oral Daily   melatonin  5 mg Oral QHS   metoprolol tartrate  12.5 mg Per Tube BID   modafinil  100 mg Oral Daily   multivitamin  15 mL Per Tube Daily   pantoprazole sodium  40 mg Per Tube Daily   rosuvastatin  20 mg Oral Daily   sodium chloride flush  3 mL Intravenous Q12H   sodium chloride  1 g Oral BID WC   tamsulosin  0.4 mg Oral QPC supper   thiamine  100 mg Oral Daily   Continuous Infusions:  sodium chloride 10 mL/hr at 07/30/20 0400     LOS: 35 days    Time  spent: 28 minutes    Sharen Hones, MD [facility]   To contact the attending provider between 7A-7P or the covering provider during after hours 7P-7A, please log into the web site [email or web address] and access using universal [place] password for that web site. If you do not have the password, please call the hospital operator.  08/17/2020, 10:12 AM

## 2020-08-18 ENCOUNTER — Inpatient Hospital Stay: Payer: Medicare Other

## 2020-08-18 DIAGNOSIS — J690 Pneumonitis due to inhalation of food and vomit: Secondary | ICD-10-CM

## 2020-08-18 LAB — GLUCOSE, CAPILLARY
Glucose-Capillary: 149 mg/dL — ABNORMAL HIGH (ref 70–99)
Glucose-Capillary: 151 mg/dL — ABNORMAL HIGH (ref 70–99)
Glucose-Capillary: 152 mg/dL — ABNORMAL HIGH (ref 70–99)
Glucose-Capillary: 153 mg/dL — ABNORMAL HIGH (ref 70–99)
Glucose-Capillary: 167 mg/dL — ABNORMAL HIGH (ref 70–99)
Glucose-Capillary: 185 mg/dL — ABNORMAL HIGH (ref 70–99)

## 2020-08-18 MED ORDER — SODIUM CHLORIDE 0.9% FLUSH: 10.0000 mL | INTRAVENOUS | Status: DC | PRN

## 2020-08-18 MED ORDER — PANTOPRAZOLE SODIUM 40 MG IV SOLR
40.0000 mg | INTRAVENOUS | Status: DC
  Administered 2020-08-19 – 2020-08-21 (×3): 40 mg via INTRAVENOUS
  Filled 2020-08-18 (×3): qty 40

## 2020-08-18 MED ORDER — SODIUM CHLORIDE 0.9% FLUSH
10.0000 mL | Freq: Two times a day (BID) | INTRAVENOUS | Status: DC
  Administered 2020-08-18 – 2020-08-23 (×10): 10 mL

## 2020-08-18 MED ORDER — SODIUM CHLORIDE 0.9 % IV SOLN
3.0000 g | Freq: Three times a day (TID) | INTRAVENOUS | Status: DC
  Administered 2020-08-18 – 2020-08-20 (×7): 3 g via INTRAVENOUS
  Filled 2020-08-18: qty 3
  Filled 2020-08-18 (×5): qty 8
  Filled 2020-08-18: qty 3
  Filled 2020-08-18 (×2): qty 8

## 2020-08-18 MED ORDER — VITAL AF 1.2 CAL PO LIQD
1000.0000 mL | ORAL | Status: DC
  Administered 2020-08-18 – 2020-08-19 (×2): 1000 mL

## 2020-08-18 NOTE — Progress Notes (Signed)
OT Cancellation Note  Patient Details Name: Casey Baldwin MRN: [id number] DOB: Sep 14, 1941   Cancelled Treatment:    Reason Eval/Treat Not Completed: Fatigue/lethargy limiting ability to participate Pt. was unable to follow commands, or alert to actively engage, and participate in the treatment session. Pt. Nurse present, and assisted with positioning head and neck in midline for comfort secondary to lateral head, and neck flexion to the right. Will reattempt on the next treatment date.  Olegario Messier, MS, OTR/L 08/18/2020, 3:37 PM

## 2020-08-18 NOTE — Progress Notes (Signed)
PROGRESS NOTE    Casey Baldwin  TGG:[id number] DOB: 04/05/1941 DOA: 07/12/2020 PCP: Lesly Rubenstein, MD   Chief complaint.  Altered mental status. 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 Rash is intensely pruritic different stages of healing some areas of vesicular be red border. Some areas covered in black eschars. Skin is diffusely tender.Per family (asked by me) pt developed confusion. Was c/o side to back pain.  Sed rate was elevated. LP was considered but patient declined stating that she had a history of meningitis in the past and very scared of having LP done. Hospitalist was called for admission for shingles and possible encephalitis.  Patient was treated with IV acyclovir, she has so far completed the whole course. Due to poor p.o. intake, a Dobbhoff was placed on 9/13, tube feeding started. LP was performed on 9/14. Condition consistent with viral meningitis/encephalitis. EEG did not show any seizure activity. MRI of the brain without contrast on 9/14 showed nonspecific white matter changes, no large vessel occlusion on MRI of the brain.  Patient condition has been gradually improving, completed 21 days of IV acyclovir.   Assessment & Plan:   Active Problems:   SIRS (systemic inflammatory response syndrome) (HCC)   Shingles rash   Delirium   Acute metabolic encephalopathy   Essential hypertension   Anemia   Acute lower UTI   Chronic diastolic CHF (congestive heart failure) (HCC)   Sepsis (HCC)   Change in mental status   Anorexia   Malnutrition of moderate  degree   Hyponatremia   Obstructive sleep apnea   PSVT (paroxysmal supraventricular tachycardia) (HCC)   Thrombocytosis   Acute urinary retention  #1.  Aspiration pneumonia. Patient has been pocketing food in her mouth, she has a very weak cough, nurse suction out large amount of mucus, yellow.  She became more sleepy with no appetite again today.  No hypoxemia. Suspect patient had developed aspiration pneumonia.  Obtain chest x-ray.  Start coverage with Unasyn as patient condition can deteriorate pretty quickly given her chronic medical condition and impaired immune system. I will hold off oral diet, start tube feeding to supply 100% nutrition needed.  #2.  Acute metabolic cephalopathy. Condition gradually improving.  3.  Anorexia. Hold oral diet for now.  Continue tube feeding.  #4.  Urinary retention. Continue Foley catheter.  5.  Shingles with aseptic meningitis, VZV encephalitis. Condition improved, however patient may have worsening dementia from this.  6.  Anemia of chronic disease. Check a CBC tomorrow, transfuse as needed.  7.  Chronic diastolic congestive heart failure. Currently has no evidence of volume overload.   DVT prophylaxis: Lovenox Code Status: SNF Family Communication: Daughter updated.  .   Status is: Inpatient  Remains inpatient appropriate because:Inpatient level of care appropriate due to severity of illness   Dispo: The patient is from: Home              Anticipated d/c is to: SNF              Anticipated d/c date is: > 3 days  Patient currently is not medically stable to d/c.        I/O last 3 completed shifts: In: 150 [P.O.:120; NG/GT:30] Out: 1850 [Urine:1850] No intake/output data recorded.     Consultants:   ID, neurology  Procedures: None  Antimicrobials:Unasyn  Subjective: Patient is more sleepy this morning, she was not able to eat orally since yesterday.  She has a large amount of mucus production, she  has a very weak cough.  She was pocketing food in her mouth since yesterday, she was not able to swallow properly. She does not have any abdominal pain or nausea vomiting.  No constipation or diarrhea. No fever or chills. Still has a Foley catheter for urinary retention.  Objective: Vitals:   08/17/20 1519 08/18/20 0034 08/18/20 0635 08/18/20 0756  BP: 133/67 136/68  140/80  Pulse: 89 96  97  Resp: 17 18  16   Temp: 98.2 F (36.8 C) 98.5 F (36.9 C)  98.9 F (37.2 C)  TempSrc:    Oral  SpO2: 100% 100%  100%  Weight:   78 kg   Height:        Intake/Output Summary (Last 24 hours) at 08/18/2020 0926 Last data filed at 08/18/2020 0400 Gross per 24 hour  Intake 150 ml  Output 1450 ml  Net -1300 ml   Filed Weights   08/15/20 0647 08/16/20 0639 08/18/20 0635  Weight: 75.3 kg 77.3 kg 78 kg    Examination:  General exam: Chronically ill, very weak. Respiratory system: A few crackles in the right base. Respiratory effort normal. Cardiovascular system: S1 & S2 heard, RRR. No JVD, murmurs, rubs, gallops or clicks. No pedal edema. Gastrointestinal system: Abdomen is nondistended, soft and nontender. No organomegaly or masses felt. Normal bowel sounds heard. Central nervous system: Drowsy and confused. No focal neurological deficits. Extremities: Symmetric  Skin: No rashes, lesions or ulcers     Data Reviewed: I have personally reviewed following labs and imaging studies  CBC: Recent Labs  Lab 08/15/20 0257  WBC 9.0  HGB 8.6*  HCT 27.3*  MCV 102.2*  PLT 322   Basic Metabolic Panel: Recent Labs  Lab 08/13/20 0920 08/14/20 0912 08/15/20 0257 08/16/20 0256  NA 140 139 141 136  K  --   --  3.9 3.9  CL  --   --  105 99  CO2  --   --  28 28  GLUCOSE  --   --  152* 161*  BUN  --   --  36* 31*  CREATININE  --   --  0.42* 0.46  CALCIUM  --   --  9.3 9.0  MG  --   --   --  2.1  PHOS  --   --   --  3.7   GFR: Estimated Creatinine Clearance: 56.7 mL/min (by C-G formula  based on SCr of 0.46 mg/dL). Liver Function Tests: Recent Labs  Lab 08/15/20 0257  AST 110*  ALT 228*  ALKPHOS 57  BILITOT 0.4  PROT 6.3*  ALBUMIN 2.5*   No results for input(s): LIPASE, AMYLASE in the last 168 hours. No results for input(s): AMMONIA in the last 168 hours. Coagulation Profile: No results for input(s): INR, PROTIME in the last 168 hours. Cardiac Enzymes: Recent Labs  Lab 08/15/20 0257  CKTOTAL 82   BNP (last 3 results) No results for input(s): PROBNP in the last 8760 hours. HbA1C: No results for input(s): HGBA1C in the last 72 hours. CBG: Recent Labs  Lab 08/17/20 1518 08/17/20 2046 08/18/20 0032 08/18/20 0428 08/18/20 0756  GLUCAP 135* 113* 153* 167* 149*   Lipid Profile: No results for input(s): CHOL, HDL, LDLCALC, TRIG, CHOLHDL, LDLDIRECT in the last 72 hours. Thyroid Function Tests: No results for input(s): TSH, T4TOTAL, FREET4, T3FREE, THYROIDAB in the last 72 hours. Anemia Panel: No results for input(s): VITAMINB12, FOLATE, FERRITIN, TIBC, IRON, RETICCTPCT in the last 72 hours. Sepsis Labs: No results for input(s): PROCALCITON, LATICACIDVEN in the last 168 hours.  No results found for this or any previous visit (from the past 240 hour(s)).       Radiology Studies: MR ANKLE RIGHT WO CONTRAST  Result Date: 08/17/2020 CLINICAL DATA:  Body aches, right knee swelling, right ankle pain EXAM: MRI OF THE RIGHT ANKLE WITHOUT CONTRAST TECHNIQUE: Multiplanar, multisequence MR imaging of the ankle was performed. No intravenous contrast was administered. COMPARISON:  Radiographs from 08/15/2020 FINDINGS: TENDONS Peroneal: Common peroneus and peroneus brevis tenosynovitis. Posteromedial: Mild tibialis posterior, flexor digitorum longus, and flexor hallucis longus tenosynovitis along with mild to moderate distal tibialis posterior tendinopathy. Correlate clinically in assessing for tibialis posterior dysfunction. Anterior: Unremarkable Achilles: Mild  fusiform thickening of the distal Achilles tendon with mildly increased striated accentuated longitudinal T2 signal compatible with tendinopathy. Plantar Fascia: Mildly thickened medial band of the plantar fascia with accentuated internal signal as shown for example on image 28 of series 6, compatible with mild plantar fasciitis. LIGAMENTS Lateral: Borderline thickened appearance of the calcaneonavicular ligament. Medial: Mild thickening of the superomedial portion of the spring ligament. CARTILAGE Ankle Joint: Mild degenerative chondral thinning with minimal marginal spurring. 5 mm non-fragmented osteochondral lesion or degenerative subcortical cyst of the posteromedial talar dome (image 13, series 5). Subtalar Joints/Sinus Tarsi: Small effusion of the posterior subtalar facet for example on image 8 of series 8. Mild degenerative chondral thinning. Bones: No abnormal signal in the distal tibia at the site of questionable prior demineralization on radiography. No findings of osteomyelitis involving the ankle. Moderate midfoot spurring especially dorsally. Mild to moderate degenerative findings along the Lisfranc joint degenerative subcortical cyst or geode proximally in the dorsal medial cuneiform and in the adjacent portion of the navicular as on image 20 of series 5. Other: No abscess is observed. IMPRESSION: 1. No findings of osteomyelitis or abscess. 2. Mild plantar fasciitis. 3. Mild tibialis posterior, flexor digitorum longus, and flexor hallucis longus tenosynovitis along with mild to moderate distal tibialis posterior tendinopathy. Correlate clinically in assessing for tibialis posterior dysfunction. 4. Mild thickening of the superomedial portion of the spring ligament. 5. 5 mm non-fragmented osteochondral lesion or degenerative subcortical cyst of the posteromedial talar dome. Mild degenerative chondral thinning in the tibiotalar joint and subtalar joint. Moderate midfoot spurring. 6. Small effusion of the  posterior subtalar facet. 7. Mild to moderate degenerative findings along the Lisfranc joint. Electronically Signed   By: Gaylyn Rong M.D.   On: 08/17/2020 07:29        Scheduled Meds: . Chlorhexidine Gluconate Cloth  6 each Topical Daily  . enoxaparin (LOVENOX) injection  40 mg Subcutaneous Q24H  . feeding supplement ([employer] STANDARD 1.4)  325 mL Oral BID BM  . feeding supplement (VITAL AF 1.2 CAL)  720 mL Per Tube Q24H  . free water  20 mL Per Tube Q6H  . insulin aspart  0-9 Units Subcutaneous Q4H  . lactulose  20 g Oral BID  . liver oil-zinc oxide   Topical BID  . megestrol  400 mg Oral Daily  .  melatonin  5 mg Oral QHS  . metoprolol tartrate  12.5 mg Per Tube BID  . modafinil  100 mg Oral Daily  . multivitamin  15 mL Per Tube Daily  . pantoprazole (PROTONIX) IV  40 mg Intravenous Q24H  . sodium chloride flush  3 mL Intravenous Q12H  . sodium chloride  1 g Oral BID WC  . tamsulosin  0.4 mg Oral QPC supper   Continuous Infusions: . sodium chloride 10 mL/hr at 07/30/20 0400  . ampicillin-sulbactam (UNASYN) IV       LOS: 36 days    Time spent: 36 minutes    Marrion Coyekui Letoya Stallone, MD [facility]   To contact the attending provider between 7A-7P or the covering provider during after hours 7P-7A, please log into the web site [email or web address] and access using universal [place] password for that web site. If you do not have the password, please call the hospital operator.  08/18/2020, 9:26 AM

## 2020-08-18 NOTE — Progress Notes (Signed)
Pt was having coughing and was trying to spit the medication given orally. MD was informed and ordered Chest Xray and talked to daughter at bedside, IV abx was started. Pt was placed to NPO and tube feeding was restarted at 40cc/hr per MD. Lacie Draft was notified and changed the tube feeding rate to 60cc/hr continuous. PO med was changed to IV. To monitor pt.

## 2020-08-18 NOTE — Progress Notes (Addendum)
Brief Nutrition Follow Up  RD contact via on call pager regarding patients current tube feeding rate. This am patient was found pocketing food in her mouth and RN suctioned large amount of unswallowed liquid. Patient made NPO due to concern for aspiration. RD to change tube feeding back to continuous to meet 100% of needs until diet can be advanced further.   Transition tube feeding:  -Vital AF 1.2 @ 60 ml/hr via Dobhoff (1440 ml) -Free water flushes 200 ml Q6 hours   Provides: 1728 kcals, 108 grams protein, 1168 ml free water (1968 ml with flushes). Meets 100% of needs.   Discontinue [employer] supplement and calorie count order for now.   Vanessa Kick RD, LDN Clinical Nutrition Pager listed in AMION

## 2020-08-19 LAB — GLUCOSE, CAPILLARY
Glucose-Capillary: 125 mg/dL — ABNORMAL HIGH (ref 70–99)
Glucose-Capillary: 127 mg/dL — ABNORMAL HIGH (ref 70–99)
Glucose-Capillary: 143 mg/dL — ABNORMAL HIGH (ref 70–99)
Glucose-Capillary: 143 mg/dL — ABNORMAL HIGH (ref 70–99)
Glucose-Capillary: 160 mg/dL — ABNORMAL HIGH (ref 70–99)
Glucose-Capillary: 162 mg/dL — ABNORMAL HIGH (ref 70–99)

## 2020-08-19 LAB — CBC WITH DIFFERENTIAL/PLATELET
Abs Immature Granulocytes: 0.08 10*3/uL — ABNORMAL HIGH (ref 0.00–0.07)
Basophils Absolute: 0 10*3/uL (ref 0.0–0.1)
Basophils Relative: 0 %
Eosinophils Absolute: 0.3 10*3/uL (ref 0.0–0.5)
Eosinophils Relative: 3 %
HCT: 26.7 % — ABNORMAL LOW (ref 36.0–46.0)
Hemoglobin: 8.8 g/dL — ABNORMAL LOW (ref 12.0–15.0)
Immature Granulocytes: 1 %
Lymphocytes Relative: 11 %
Lymphs Abs: 1.1 10*3/uL (ref 0.7–4.0)
MCH: 32.7 pg (ref 26.0–34.0)
MCHC: 33 g/dL (ref 30.0–36.0)
MCV: 99.3 fL (ref 80.0–100.0)
Monocytes Absolute: 0.6 10*3/uL (ref 0.1–1.0)
Monocytes Relative: 6 %
Neutro Abs: 8.2 10*3/uL — ABNORMAL HIGH (ref 1.7–7.7)
Neutrophils Relative %: 79 %
Platelets: 274 10*3/uL (ref 150–400)
RBC: 2.69 MIL/uL — ABNORMAL LOW (ref 3.87–5.11)
RDW: 14.5 % (ref 11.5–15.5)
WBC: 10.3 10*3/uL (ref 4.0–10.5)
nRBC: 0 % (ref 0.0–0.2)

## 2020-08-19 LAB — BASIC METABOLIC PANEL
Anion gap: 10 (ref 5–15)
BUN: 28 mg/dL — ABNORMAL HIGH (ref 8–23)
CO2: 24 mmol/L (ref 22–32)
Calcium: 8.8 mg/dL — ABNORMAL LOW (ref 8.9–10.3)
Chloride: 100 mmol/L (ref 98–111)
Creatinine, Ser: 0.5 mg/dL (ref 0.44–1.00)
GFR, Estimated: >60 mL/min (ref >60)
Glucose, Bld: 139 mg/dL — ABNORMAL HIGH (ref 70–99)
Potassium: 4 mmol/L (ref 3.5–5.1)
Sodium: 134 mmol/L — ABNORMAL LOW (ref 135–145)

## 2020-08-19 LAB — PHOSPHORUS: Phosphorus: 3.3 mg/dL (ref 2.5–4.6)

## 2020-08-19 LAB — MAGNESIUM: Magnesium: 2 mg/dL (ref 1.7–2.4)

## 2020-08-19 NOTE — Progress Notes (Signed)
PROGRESS NOTE    Casey Baldwin  JGO:[id number] DOB: 11-11-1940 DOA: 07/12/2020 PCP: Lesly Rubenstein, MD   Of complaint.  Altered mental status.  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 Rash is intensely pruritic different stages of healing some areas of vesicular be red border. Some areas covered in black eschars. Skin is diffusely tender.Per family (asked by me) pt developed confusion. Was c/o side to back pain.  Sed rate was elevated. LP was considered but patient declined stating that she had a history of meningitis in the past and very scared of having LP done. Hospitalist was called for admission for shingles and possible encephalitis.  Patient was treated with IV acyclovir, she has so far completed the whole course. Due to poor p.o. intake, a Dobbhoff was placed on 9/13, tube feeding started. LP was performed on 9/14. Condition consistent with viral meningitis/encephalitis. EEG did not show any seizure activity. MRI of the brain without contrast on 9/14 showed nonspecific white matter changes, no large vessel occlusion on MRI of the brain.  Patient condition has been gradually improving, completed 21 days of IV acyclovir.   Assessment & Plan:   Active Problems:   SIRS (systemic inflammatory response syndrome) (HCC)   Shingles rash   Delirium   Acute metabolic encephalopathy   Essential hypertension   Anemia   Acute lower UTI   Chronic diastolic CHF (congestive heart failure) (HCC)   Sepsis (HCC)   Change in mental status   Anorexia   Malnutrition of moderate degree    Hyponatremia   Obstructive sleep apnea   PSVT (paroxysmal supraventricular tachycardia) (HCC)   Thrombocytosis   Acute urinary retention   Aspiration pneumonia of both lower lobes due to gastric secretions (HCC)  #1.  Aspiration pneumonia. Condition seems to better today.  Reviewed yesterday chest x-ray, has some atelectasis.  Continue IV antibiotic for another day, transition to oral Augmentin tomorrow.  2.  Acute metabolic encephalopathy. Secondary to VZV encephalitis. Condition gradually improving.  Today, she is alert and oriented to time place and person.  3.  Anorexia. Patient is still receiving tube feeding.  Since her mental status improved today, I will start a soft diet with dysphagia 1.  4.  Urinary retention. Continue Foley catheter.  5.  Shingles with aseptic meningitis, VZV encephalitis. Continue to follow.  Completed acyclovir.  6.  Anemia of chronic disease. Stable.  7.  Chronic diastolic congestive heart failure.  No evidence of volume overload.    DVT prophylaxis: Lovenox Code Status: Full Family Communication: Daughter updated.     Status is: Inpatient  Remains inpatient appropriate because:Inpatient level of care appropriate due to severity of illness.  No discharge option and to Dobbhoff is removed.   Dispo: The patient is from: Home              Anticipated d/c is to: SNF              Anticipated d/c date is: > 3 days              Patient currently is not medically stable to d/c.  I/O last 3 completed shifts: In: 350.1 [NG/GT:50; IV Piggyback:300.1] Out: 2400 [Urine:2400] No intake/output data recorded.     Consultants:   Neurology, ID  Procedures: None  Antimicrobials: Unasyn  Subjective: Patient doing better today.  She is more awake today, she has no confusion. She tolerated tube feeding well.  No nausea vomiting abdominal pain. She has some short of breath, but no cough. No fever or chills. No abdominal pain or  diarrhea.  Objective: Vitals:   08/18/20 0756 08/18/20 1601 08/19/20 0038 08/19/20 0500  BP: 140/80 (!) 145/79 122/61   Pulse: 97 79 89   Resp: 16 17 18    Temp: 98.9 F (37.2 C) 98.7 F (37.1 C) 98.5 F (36.9 C)   TempSrc: Oral Oral Oral   SpO2: 100% 99% 100%   Weight:    78.1 kg  Height:        Intake/Output Summary (Last 24 hours) at 08/19/2020 1032 Last data filed at 08/19/2020 0643 Gross per 24 hour  Intake 300.06 ml  Output 1550 ml  Net -1249.94 ml   Filed Weights   08/16/20 0639 08/18/20 0635 08/19/20 0500  Weight: 77.3 kg 78 kg 78.1 kg    Examination:  General exam: Appears calm and comfortable  Respiratory system: Clear to auscultation. Respiratory effort normal. Cardiovascular system: S1 & S2 heard, RRR. No JVD, murmurs, rubs, gallops or clicks. No pedal edema. Gastrointestinal system: Abdomen is nondistended, soft and nontender. No organomegaly or masses felt. Normal bowel sounds heard. Central nervous system: Alert and oriented x3.  No focal neurological deficits. Extremities: Symmetric . Skin: No rashes, lesions or ulcers Psychiatry: Mood & affect appropriate.     Data Reviewed: I have personally reviewed following labs and imaging studies  CBC: Recent Labs  Lab 08/15/20 0257 08/19/20 0542  WBC 9.0 10.3  NEUTROABS  --  8.2*  HGB 8.6* 8.8*  HCT 27.3* 26.7*  MCV 102.2* 99.3  PLT 322 274   Basic Metabolic Panel: Recent Labs  Lab 08/13/20 0920 08/14/20 0912 08/15/20 0257 08/16/20 0256 08/19/20 0542  NA 140 139 141 136 134*  K  --   --  3.9 3.9 4.0  CL  --   --  105 99 100  CO2  --   --  28 28 24   GLUCOSE  --   --  152* 161* 139*  BUN  --   --  36* 31* 28*  CREATININE  --   --  0.42* 0.46 0.50  CALCIUM  --   --  9.3 9.0 8.8*  MG  --   --   --  2.1 2.0  PHOS  --   --   --  3.7 3.3   GFR: Estimated Creatinine Clearance: 56.8 mL/min (by C-G formula based on SCr of 0.5 mg/dL). Liver Function Tests: Recent Labs  Lab 08/15/20 0257    AST 110*  ALT 228*  ALKPHOS 57  BILITOT 0.4  PROT 6.3*  ALBUMIN 2.5*   No results for input(s): LIPASE, AMYLASE in the last 168 hours. No results for input(s): AMMONIA in the last 168 hours. Coagulation Profile: No results for input(s): INR, PROTIME in the last 168 hours. Cardiac Enzymes: Recent Labs  Lab 08/15/20 0257  CKTOTAL 82   BNP (last 3 results) No results for input(s): PROBNP in the last 8760 hours. HbA1C: No results for input(s): HGBA1C in the last 72 hours. CBG: Recent Labs  Lab 08/18/20 1201 08/18/20 1555 08/18/20 2013 08/19/20 0001 08/19/20 0403  GLUCAP  151* 185* 152* 143* 143*   Lipid Profile: No results for input(s): CHOL, HDL, LDLCALC, TRIG, CHOLHDL, LDLDIRECT in the last 72 hours. Thyroid Function Tests: No results for input(s): TSH, T4TOTAL, FREET4, T3FREE, THYROIDAB in the last 72 hours. Anemia Panel: No results for input(s): VITAMINB12, FOLATE, FERRITIN, TIBC, IRON, RETICCTPCT in the last 72 hours. Sepsis Labs: No results for input(s): PROCALCITON, LATICACIDVEN in the last 168 hours.  No results found for this or any previous visit (from the past 240 hour(s)).       Radiology Studies: DG Chest 1 View  Result Date: 08/18/2020 CLINICAL DATA:  Aspiration pneumonia. EXAM: CHEST  1 VIEW COMPARISON:  08/09/2020 FINDINGS: Feeding tube is been pulled back with tip now in the distal thoracic esophagus. Heart size is normal. Aortic atherosclerotic calcification noted. Linear opacity in both lung bases is stable and consistent with subsegmental atelectasis. No evidence of pulmonary consolidation or pleural effusion. IMPRESSION: Feeding tube tip now in distal thoracic esophagus. Stable bibasilar subsegmental atelectasis. Electronically Signed   By: Danae Orleans M.D.   On: 08/18/2020 10:19        Scheduled Meds:  Chlorhexidine Gluconate Cloth  6 each Topical Daily   enoxaparin (LOVENOX) injection  40 mg Subcutaneous Q24H   free water  20 mL Per  Tube Q6H   insulin aspart  0-9 Units Subcutaneous Q4H   lactulose  20 g Oral BID   liver oil-zinc oxide   Topical BID   megestrol  400 mg Oral Daily   melatonin  5 mg Oral QHS   metoprolol tartrate  12.5 mg Per Tube BID   modafinil  100 mg Oral Daily   multivitamin  15 mL Per Tube Daily   pantoprazole (PROTONIX) IV  40 mg Intravenous Q24H   sodium chloride flush  10-40 mL Intracatheter Q12H   sodium chloride flush  3 mL Intravenous Q12H   sodium chloride  1 g Oral BID WC   tamsulosin  0.4 mg Oral QPC supper   Continuous Infusions:  sodium chloride 10 mL/hr at 07/30/20 0400   ampicillin-sulbactam (UNASYN) IV Stopped (08/19/20 0238)   feeding supplement (VITAL AF 1.2 CAL) 1,000 mL (08/19/20 0613)     LOS: 37 days    Time spent: 28 minutes    Marrion Coy, MD [facility]   To contact the attending provider between 7A-7P or the covering provider during after hours 7P-7A, please log into the web site [email or web address] and access using universal [place] password for that web site. If you do not have the password, please call the hospital operator.  08/19/2020, 10:32 AM

## 2020-08-19 NOTE — Plan of Care (Signed)

## 2020-08-20 DIAGNOSIS — A86 Unspecified viral encephalitis: Secondary | ICD-10-CM

## 2020-08-20 DIAGNOSIS — G9340 Encephalopathy, unspecified: Secondary | ICD-10-CM | POA: Diagnosis not present

## 2020-08-20 DIAGNOSIS — B029 Zoster without complications: Secondary | ICD-10-CM | POA: Diagnosis not present

## 2020-08-20 DIAGNOSIS — R079 Chest pain, unspecified: Secondary | ICD-10-CM | POA: Diagnosis not present

## 2020-08-20 DIAGNOSIS — R410 Disorientation, unspecified: Secondary | ICD-10-CM | POA: Diagnosis not present

## 2020-08-20 LAB — GLUCOSE, CAPILLARY
Glucose-Capillary: 117 mg/dL — ABNORMAL HIGH (ref 70–99)
Glucose-Capillary: 142 mg/dL — ABNORMAL HIGH (ref 70–99)
Glucose-Capillary: 143 mg/dL — ABNORMAL HIGH (ref 70–99)
Glucose-Capillary: 144 mg/dL — ABNORMAL HIGH (ref 70–99)
Glucose-Capillary: 150 mg/dL — ABNORMAL HIGH (ref 70–99)
Glucose-Capillary: 167 mg/dL — ABNORMAL HIGH (ref 70–99)
Glucose-Capillary: 171 mg/dL — ABNORMAL HIGH (ref 70–99)

## 2020-08-20 MED ORDER — AMOXICILLIN-POT CLAVULANATE 400-57 MG/5ML PO SUSR
800.0000 mg | Freq: Two times a day (BID) | ORAL | Status: DC
  Administered 2020-08-20: 800 mg
  Filled 2020-08-20 (×3): qty 10

## 2020-08-20 MED ORDER — FUROSEMIDE 10 MG/ML PO SOLN
40.0000 mg | Freq: Once | ORAL | Status: AC
  Administered 2020-08-20: 40 mg
  Filled 2020-08-20: qty 4

## 2020-08-20 NOTE — Plan of Care (Signed)

## 2020-08-20 NOTE — Progress Notes (Signed)
NGT found to have been displaced by 15cm, advanced to documented mark and order requested for xray to confirm placement. MD advised to remove NGT, notify family. Darl Pikes, pt contact, updated. Pt tolerated removal well, NGT intact at time of removal.

## 2020-08-20 NOTE — Progress Notes (Signed)
ID Pt more alert today Coherent speech Over the weekend there was concern that she could have aspirated She never had fever unasyn was started by hospitalist for possible aspiration pneumonia As oxygen requirements was increased CXR no infiltrate   Patient Vitals for the past 24 hrs:  BP Temp Temp src Pulse Resp SpO2 Weight  08/20/20 1556 132/67 98.6 F (37 C) Axillary 92 16 100 % --  08/20/20 0746 138/65 97.6 F (36.4 C) Axillary 83 18 100 % --  08/20/20 0404 -- -- -- -- -- -- 79 kg  08/20/20 0124 (!) 143/73 98.6 F (37 C) Oral 86 17 100 % --  08/19/20 2219 137/70 98 F (36.7 C) Oral 80 17 98 % --    Chest b/l ai entry NG tube HSs1s2 Abd soft abd wall hernia Moves upper extremities a lot more   Labs CBC Latest Ref Rng & Units 08/19/2020 08/15/2020 08/10/2020  WBC 4.0 - 10.5 K/uL 10.3 9.0 12.8(H)  Hemoglobin 12.0 - 15.0 g/dL 2.5(Z) 5.6(L) 8.7(F)  Hematocrit 36 - 46 % 26.7(L) 27.3(L) 28.6(L)  Platelets 150 - 400 K/uL 274 322 346    CMP Latest Ref Rng & Units 08/19/2020 08/16/2020 08/15/2020  Glucose 70 - 99 mg/dL 643(P) 295(J) 884(Z)  BUN 8 - 23 mg/dL 66(A) 63(K) 16(W)  Creatinine 0.44 - 1.00 mg/dL 1.09 3.23 5.57(D)  Sodium 135 - 145 mmol/L 134(L) 136 141  Potassium 3.5 - 5.1 mmol/L 4.0 3.9 3.9  Chloride 98 - 111 mmol/L 100 99 105  CO2 22 - 32 mmol/L 24 28 28   Calcium 8.9 - 10.3 mg/dL ) 9.0 9.3  Total Protein 6.5 - 8.1 g/dL - - 6.3(L)  Total Bilirubin 0.3 - 1.2 mg/dL - - 0.4  Alkaline Phos 38 - 126 U/L - - 57  AST 15 - 41 U/L - - 110(H)  ALT 0 - 44 U/L - - 228(H)    Assessment/Plan:  Prolonged hospitalization with encephalopathy with risk of aspiration I do not see signs of aspiration pneumonia on CXR, clinical examination. So would recommend stopping antibiotics Dubhoff was removed this evening which will decrease risk for aspiration  Hypoactive delirium following VZV meningoencephalitis - fluctuating level of alertness- overall improving alertness in the  past week.   Periods of lucidity and interaction with somnolence. She needs constant stimulation and activity Need PT/OT/speech, OOB, breathing exercise,Regular bowel movts, Na optimization andcognitive rehab [facility]  Admitted on 9/12 with chest pain rt side, fever, headache and leg painandintermittent confusion  Found to have herpes zoster Thoracic dermatome  Herpes zoster with aseptic meningitis on admission ( patient had refused LPinitially)- was started on IV acyclovir 10mg /Kg q 8. She was alert and verbal and appropriate on presentation but after 5-6 days gradual deterioration to obtundation. Seen by neuro- delirium was questioned -EEG on 07/20/20 no seizure activity..There were many reasons including changes in sleep rhythm, poor intake, constipation, not wearing CPAp ( but no co2 narcosis) hyponatremia for the obtundation On 9/14 Had LP which revealed lymphocytic pleocytosis bacterial culture neg and HSV DNA neg- VZVneg .VzV ig G very high> 4000 west Nile virus IgG in csf was positive but not IgM and serum antibodies neg- so this isless likelyacute WNV and IgG is not a reliable test for diagnosis of acute infection as there is cross reactivity with other flavi virus and dengue-There is no treatment for WNV.  MRI and MRA done on 9/14 no acute changes. Daughter wanted her to be in 10/14, but no bed  andapp[facility] Repeat EEG done on 07/25/20 no seizure activity noted. keppra stopped So the diagnosis remains VZV inducedmeningoencephalitis) Completed 21 days of acycolvir threapy for VZV induced meningo encephalitison 08/02/20  waxing and waning mentalstatussince earlier in admission  Urinary retention -9/12- has foley-failed voiding trial on 9/28 and reinserted 9/29  Stiffness and pain lower extremities and neck due to immobility ( Xray and MRI of the rt leg- nothing acute)  Rt sided abdominal firmness and fullness CT  abdomen showedFluid collectionpseudocyst  Complicated stay at [facility] between 04/01/20 until 05/14/20 for abdominal pain which was diagnosed as choledocholithiasis/cholangitis in a setting of prior RYGB in 2008. 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 and she was taken [facility] for diagnostic lap, robotic lysis of adhesions, transgastric ERCP and biliary sphincterotomy, cholecystectomy, partial gastrectomy andaccidentalcolotomy which was repaired.The immediate post op was complicated by post ERCP pancreatitis , pulmonary edema with acute hypoxic resp failure , Afib ( treated with amio and metoprolol), fever and leucocytosis suspected due to pancreatitis and was treated with zosyn, UTI due to proteus and acute on chronic anemia needing PRBC and AKI.She was seen by gerontologist for insomnia and risk for delirium   H/o left TKA  Hearing loss   Discussed the management with care team

## 2020-08-20 NOTE — Evaluation (Signed)
Occupational Therapy Re-Evaluation Patient Details Name: Casey Baldwin MRN: [id number] DOB: 02/07/1941 Today's Date: 08/20/2020    History of Present Illness Casey Baldwin is a 79 y.o. 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  Presented to ER secondary to generalized body aches, progressive weakness, headaches; admitted for management of SIRS (urine?), shingles and acute metabolic encephalopathy.   Clinical Impression   Pt seen for OT re-evaluation this date. Pt lethargic, wakes to verbal cues but has difficulty maintaining alertness. Does endorse "middle" back pain. Max-Total Assist for repositioning to improve midline alignment and support pain relief. Pt did endorse improvement afterwards. Pt also noted with head rotated to the L side and demonstrated difficulty with correcting to midline, requiring manual cues and rolled towel under pillow to support repositioning. Notified RN who entered session about head positioning to support carryover of care. RN verbalized understanding. With set up, pt able to initiate bed level grooming tasks however fatigued quickly and ultimately required Max A to wash her face. Pt continues to demonstrate significant impairments (See OT Problem list below) which are impacting her ability to perform ADL and ADL mobility at baseline level. OT goals reviewed and updated to reflect pt's progress and next steps for therapy. Continue to recommend skilled OT services to maximize return to PLOF and minimize caregiver burden. Recommend SNF vs LTACH pending pt progress.     Follow Up Recommendations  LTACH;SNF    Equipment Recommendations  None recommended by OT    Recommendations for Other Services       Precautions / Restrictions Precautions Precautions: Fall Restrictions Weight Bearing Restrictions: No Other Position/Activity Restrictions: NG tube; HOB >30 degrees; aspiration;  prevalon boots      Mobility Bed Mobility Overal bed mobility: Needs Assistance Bed Mobility: Rolling Rolling: Max assist         General bed mobility comments: cues for hand placement to improve participation, ultimately very limited  Transfers                      Balance                                           ADL either performed or assessed with clinical judgement   ADL Overall ADL's : Needs assistance/impaired Eating/Feeding: NPO Eating/Feeding Details (indicate cue type and reason): NG tube feeds, HOB>30degrees Grooming: Wash/dry face;Wash/dry hands;Bed level;Maximal assistance Grooming Details (indicate cue type and reason): initiates with set up and increased time but ultimately demonstrates difficulty with attempts to wash her L cheek, requiring Max A for washing her face                               General ADL Comments: Max A to Total A for bed level bathing, dressing, and toileting     Vision Patient Visual Report: No change from baseline       Perception     Praxis      Pertinent Vitals/Pain Pain Assessment: Faces Faces Pain Scale: Hurts little more Pain Location: middle back per pt Pain Descriptors / Indicators: Grimacing;Discomfort;Guarding Pain Intervention(s): Limited activity within patient's tolerance;Monitored during session;Repositioned     Hand Dominance Right   Extremity/Trunk Assessment Upper Extremity Assessment Upper Extremity Assessment: Generalized weakness;Difficult to assess  due to impaired cognition   Lower Extremity Assessment Lower Extremity Assessment: Generalized weakness;Difficult to assess due to impaired cognition       Communication Communication Communication: HOH   Cognition Arousal/Alertness: Lethargic Behavior During Therapy: Flat affect Overall Cognitive Status: No family/caregiver present to determine baseline cognitive functioning                                  General Comments: lethargic, does respond to voice briefly, increased processing time/initiation, HOH   General Comments       Exercises Other Exercises Other Exercises: bed level repositioning for neck to improve midline alignment to minimize contracture; stiff and preference for cervical rotation to the L   Shoulder Instructions      Home Living Family/patient expects to be discharged to:: Skilled nursing facility Living Arrangements: Alone (daughter had been staying with pt since GB surgery but works full time) Available Help at Discharge: Family;Personal care attendant Type of Home: House Home Access: Stairs to enter     Home Layout: One level     Bathroom Shower/Tub: Producer, television/film/video: Handicapped height Bathroom Accessibility: Yes   Home Equipment: Environmental consultant - 2 wheels;Bedside commode;Grab bars - toilet;Grab bars - tub/shower   Additional Comments: Above home living information per prior PT eval (pt unable to verbalize this information).      Prior Functioning/Environment Level of Independence: Independent with assistive device(s)        Comments: Per prior OT note "PCA 6hrs/day supervises bathing / assists c IADLs and a "maid" for cleaning. Enjoys hobbies of sewing/drawing".        OT Problem List: Decreased strength;Decreased activity tolerance;Impaired balance (sitting and/or standing);Decreased coordination;Decreased safety awareness;Impaired UE functional use;Decreased cognition      OT Treatment/Interventions: Self-care/ADL training;Therapeutic exercise;Energy conservation;DME and/or AE instruction;Therapeutic activities;Patient/family education;Balance training    OT Goals(Current goals can be found in the care plan section) Acute Rehab OT Goals Patient Stated Goal: none stated, no family present OT Goal Formulation: Patient unable to participate in goal setting Time For Goal Achievement: 09/03/20 Potential to Achieve Goals:  Fair ADL Goals Pt Will Perform Upper Body Bathing: with max assist;bed level;with mod assist Additional ADL Goal #1: Pt will participate in log rolling for bed level ADL tasks requiring Max A to perform.  OT Frequency: Min 1X/week   Barriers to D/C: Inaccessible home environment;Decreased caregiver support          Co-evaluation              AM-PAC OT "6 Clicks" Daily Activity     Outcome Measure Help from another person eating meals?: A Lot Help from another person taking care of personal grooming?: A Lot Help from another person toileting, which includes using toliet, bedpan, or urinal?: Total Help from another person bathing (including washing, rinsing, drying)?: Total Help from another person to put on and taking off regular upper body clothing?: Total Help from another person to put on and taking off regular lower body clothing?: Total 6 Click Score: 8   End of Session Nurse Communication:  (pt indicating pain in middle back, improved with repositioning)  Activity Tolerance: Patient tolerated treatment well Patient left: in bed;with call bell/phone within reach;with bed alarm set;with nursing/sitter in room;Other (comment) (prevalon boots, HOB 30degrees)  OT Visit Diagnosis: Other abnormalities of gait and mobility (R26.89);Muscle weakness (generalized) (M62.81)  Time: 9741-6384 OT Time Calculation (min): 14 min Charges:  OT General Charges $OT Visit: 1 Visit OT Evaluation $OT Re-eval: 1 Re-eval  Richrd Prime, MPH, MS, OTR/L ascom [phone] 08/20/20, 9:36 AM

## 2020-08-20 NOTE — Progress Notes (Signed)
PROGRESS NOTE    Casey Baldwin  IRC:[id number] DOB: 07-11-1941 DOA: 07/12/2020 PCP: Lesly Rubenstein, MD   Chief complaint altered mental status. 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 Rash is intensely pruritic different stages of healing some areas of vesicular be red border. Some areas covered in black eschars. Skin is diffusely tender.Per family (asked by me) pt developed confusion. Was c/o side to back pain.  Sed rate was elevated. LP was considered but patient declined stating that she had a history of meningitis in the past and very scared of having LP done. Hospitalist was called for admission for shingles and possible encephalitis.  Patient was treated with IV acyclovir, she has so far completed the whole course. Due to poor p.o. intake, a Dobbhoff was placed on 9/13, tube feeding started. LP was performed on 9/14. Condition consistent with viral meningitis/encephalitis. EEG did not show any seizure activity. MRI of the brain without contrast on 9/14 showed nonspecific white matter changes, no large vessel occlusion on MRI of the brain.  Patient condition has been gradually improving, completed 21 days of IV acyclovir.   Assessment & Plan:   Active Problems:   SIRS (systemic inflammatory response syndrome) (HCC)   Shingles rash   Delirium   Acute metabolic encephalopathy   Essential hypertension   Anemia   Acute lower UTI   Chronic diastolic CHF (congestive heart failure) (HCC)   Sepsis (HCC)   Change in mental status   Anorexia   Malnutrition of moderate degree    Hyponatremia   Obstructive sleep apnea   PSVT (paroxysmal supraventricular tachycardia) (HCC)   Thrombocytosis   Acute urinary retention   Aspiration pneumonia of both lower lobes due to gastric secretions (HCC)  #1.  Aspiration pneumonia. Patient completed 2 days of IV antibiotics, changed to Augmentin via her tube feeding for additional 3 days. Patient head of bed was elevated.  Instructed nurse to have patient sit up when feeding, check for the pocketing in mouth after each bite.  Make sure she swallows.  #2.  Acute metabolic encephalopathy. Secondary to VZV encephalitis. Condition gradually improving, but the mental status seem to be waxing and waning.    #3. Anorexia. Patient has not been able to eat for the last 2 days, but she is receiving 100% nutrition through tube feeding.  Patient was restarted the diet since yesterday, continue oral intake as well as tube feeding.  Nutrition is following.  4.  Urinary retention. Foley catheter is continued.  We will probably will reattempt to take the catheter out when patient is able to sit up.  5.  Shingles with aseptic meningitis.  VZV encephalitis. She has completed 21 days of acyclovir.  6.  Chronic diastolic congestive heart failure. Patient had 2 L oxygen requirement, she has been receiving some fluids with antibiotics infusion.  We will give her 40 mg of Lasix through feeding tube.  Wean off oxygen.    DVT prophylaxis: Lovenox Code Status: Full Family Communication: Family in the room, also called her daughter on the phone.  .   Status is: Inpatient  Remains inpatient appropriate because:Inpatient level of care  appropriate due to severity of illness   Dispo: The patient is from: Home              Anticipated d/c is to: SNF              Anticipated d/c date is: > 3 days              Patient currently is not medically stable to d/c.        I/O last 3 completed shifts: In: 1111 [I.V.:510.9; IV Piggyback:600.1] Out:  1600 [Urine:1600] No intake/output data recorded.     Consultants:   None  Procedures: None  Antimicrobials: Augmentin  Subjective: Patient is drowsy today.  But she is less confused.  When asked how she was doing, she gave me a thumb-up. She has some short of breath, but she has a very weak cough. No abdominal pain or nausea vomiting.  Abdominal distention is better. No fever chills. She slept well last night, still drowsy this morning.   Objective: Vitals:   08/19/20 2219 08/20/20 0124 08/20/20 0404 08/20/20 0746  BP: 137/70 (!) 143/73  138/65  Pulse: 80 86  83  Resp: 17 17  18   Temp: 98 F (36.7 C) 98.6 F (37 C)  97.6 F (36.4 C)  TempSrc: Oral Oral  Axillary  SpO2: 98% 100%  100%  Weight:   79 kg   Height:        Intake/Output Summary (Last 24 hours) at 08/20/2020 0934 Last data filed at 08/20/2020 0610 Gross per 24 hour  Intake 810.91 ml  Output 50 ml  Net 760.91 ml   Filed Weights   08/18/20 0635 08/19/20 0500 08/20/20 0404  Weight: 78 kg 78.1 kg 79 kg    Examination:  General exam: Appears calm and comfortable, very weak. Respiratory system: A few crackles in the base. Respiratory effort normal. Cardiovascular system: S1 & S2 heard, RRR. No JVD, murmurs, rubs, gallops or clicks. No pedal edema. Gastrointestinal system: Abdomen is nondistended, soft and nontender. No organomegaly or masses felt. Normal bowel sounds heard. Central nervous system: drowsy and oriented x2. No focal neurological deficits. Extremities: Symmetric 5 x 5 power. Skin: No rashes, lesions or ulcers Psychiatry: Mood & affect appropriate.     Data Reviewed: I have personally reviewed following labs and imaging studies  CBC: Recent Labs  Lab 08/15/20 0257 08/19/20 0542  WBC 9.0 10.3  NEUTROABS  --  8.2*  HGB 8.6* 8.8*  HCT 27.3* 26.7*  MCV 102.2* 99.3  PLT 322 274   Basic Metabolic Panel: Recent Labs  Lab 08/14/20 0912 08/15/20 0257 08/16/20 0256 08/19/20 0542   NA 139 141 136 134*  K  --  3.9 3.9 4.0  CL  --  105 99 100  CO2  --  28 28 24   GLUCOSE  --  152* 161* 139*  BUN  --  36* 31* 28*  CREATININE  --  0.42* 0.46 0.50  CALCIUM  --  9.3 9.0 8.8*  MG  --   --  2.1 2.0  PHOS  --   --  3.7 3.3   GFR: Estimated Creatinine Clearance: 57 mL/min (by C-G formula based on SCr of 0.5 mg/dL). Liver Function Tests: Recent Labs  Lab 08/15/20 0257  AST 110*  ALT 228*  ALKPHOS 57  BILITOT 0.4  PROT 6.3*  ALBUMIN 2.5*   No results for input(s): LIPASE, AMYLASE in the last 168 hours. No results for input(s): AMMONIA in the last  168 hours. Coagulation Profile: No results for input(s): INR, PROTIME in the last 168 hours. Cardiac Enzymes: Recent Labs  Lab 08/15/20 0257  CKTOTAL 82   BNP (last 3 results) No results for input(s): PROBNP in the last 8760 hours. HbA1C: No results for input(s): HGBA1C in the last 72 hours. CBG: Recent Labs  Lab 08/19/20 1951 08/20/20 0033 08/20/20 0124 08/20/20 0328 08/20/20 0747  GLUCAP 125* 144* 150* 117* 167*   Lipid Profile: No results for input(s): CHOL, HDL, LDLCALC, TRIG, CHOLHDL, LDLDIRECT in the last 72 hours. Thyroid Function Tests: No results for input(s): TSH, T4TOTAL, FREET4, T3FREE, THYROIDAB in the last 72 hours. Anemia Panel: No results for input(s): VITAMINB12, FOLATE, FERRITIN, TIBC, IRON, RETICCTPCT in the last 72 hours. Sepsis Labs: No results for input(s): PROCALCITON, LATICACIDVEN in the last 168 hours.  No results found for this or any previous visit (from the past 240 hour(s)).       Radiology Studies: No results found.      Scheduled Meds: . amoxicillin-clavulanate  800 mg Per Tube Q12H  . Chlorhexidine Gluconate Cloth  6 each Topical Daily  . enoxaparin (LOVENOX) injection  40 mg Subcutaneous Q24H  . free water  20 mL Per Tube Q6H  . furosemide  40 mg Per Tube Once  . insulin aspart  0-9 Units Subcutaneous Q4H  . lactulose  20 g Oral BID  . liver oil-zinc  oxide   Topical BID  . megestrol  400 mg Oral Daily  . melatonin  5 mg Oral QHS  . metoprolol tartrate  12.5 mg Per Tube BID  . modafinil  100 mg Oral Daily  . multivitamin  15 mL Per Tube Daily  . pantoprazole (PROTONIX) IV  40 mg Intravenous Q24H  . sodium chloride flush  10-40 mL Intracatheter Q12H  . sodium chloride flush  3 mL Intravenous Q12H  . sodium chloride  1 g Oral BID WC  . tamsulosin  0.4 mg Oral QPC supper   Continuous Infusions: . sodium chloride 10 mL/hr at 08/20/20 0610  . feeding supplement (VITAL AF 1.2 CAL) 1,000 mL (08/19/20 0613)     LOS: 38 days    Time spent: 34 minutes    Marrion Coy, MD [facility]   To contact the attending provider between 7A-7P or the covering provider during after hours 7P-7A, please log into the web site [email or web address] and access using universal [place] password for that web site. If you do not have the password, please call the hospital operator.  08/20/2020, 9:34 AM

## 2020-08-21 LAB — GLUCOSE, CAPILLARY
Glucose-Capillary: 111 mg/dL — ABNORMAL HIGH (ref 70–99)
Glucose-Capillary: 111 mg/dL — ABNORMAL HIGH (ref 70–99)
Glucose-Capillary: 116 mg/dL — ABNORMAL HIGH (ref 70–99)
Glucose-Capillary: 124 mg/dL — ABNORMAL HIGH (ref 70–99)
Glucose-Capillary: 125 mg/dL — ABNORMAL HIGH (ref 70–99)
Glucose-Capillary: 131 mg/dL — ABNORMAL HIGH (ref 70–99)
Glucose-Capillary: 139 mg/dL — ABNORMAL HIGH (ref 70–99)

## 2020-08-21 LAB — CBC WITH DIFFERENTIAL/PLATELET
Abs Immature Granulocytes: 0.09 10*3/uL — ABNORMAL HIGH (ref 0.00–0.07)
Basophils Absolute: 0 10*3/uL (ref 0.0–0.1)
Basophils Relative: 0 %
Eosinophils Absolute: 0.3 10*3/uL (ref 0.0–0.5)
Eosinophils Relative: 3 %
HCT: 28.9 % — ABNORMAL LOW (ref 36.0–46.0)
Hemoglobin: 9.7 g/dL — ABNORMAL LOW (ref 12.0–15.0)
Immature Granulocytes: 1 %
Lymphocytes Relative: 11 %
Lymphs Abs: 1.2 10*3/uL (ref 0.7–4.0)
MCH: 32.7 pg (ref 26.0–34.0)
MCHC: 33.6 g/dL (ref 30.0–36.0)
MCV: 97.3 fL (ref 80.0–100.0)
Monocytes Absolute: 0.6 10*3/uL (ref 0.1–1.0)
Monocytes Relative: 6 %
Neutro Abs: 8.2 10*3/uL — ABNORMAL HIGH (ref 1.7–7.7)
Neutrophils Relative %: 79 %
Platelets: 366 10*3/uL (ref 150–400)
RBC: 2.97 MIL/uL — ABNORMAL LOW (ref 3.87–5.11)
RDW: 14.1 % (ref 11.5–15.5)
WBC: 10.4 10*3/uL (ref 4.0–10.5)
nRBC: 0 % (ref 0.0–0.2)

## 2020-08-21 LAB — BASIC METABOLIC PANEL
Anion gap: 11 (ref 5–15)
BUN: 25 mg/dL — ABNORMAL HIGH (ref 8–23)
CO2: 24 mmol/L (ref 22–32)
Calcium: 9.5 mg/dL (ref 8.9–10.3)
Chloride: 99 mmol/L (ref 98–111)
Creatinine, Ser: 0.47 mg/dL (ref 0.44–1.00)
GFR, Estimated: >60 mL/min (ref >60)
Glucose, Bld: 126 mg/dL — ABNORMAL HIGH (ref 70–99)
Potassium: 4.3 mmol/L (ref 3.5–5.1)
Sodium: 134 mmol/L — ABNORMAL LOW (ref 135–145)

## 2020-08-21 LAB — MAGNESIUM: Magnesium: 2 mg/dL (ref 1.7–2.4)

## 2020-08-21 MED ORDER — PANTOPRAZOLE SODIUM 40 MG PO PACK
40.0000 mg | PACK | Freq: Every day | ORAL | Status: DC
  Filled 2020-08-21 (×2): qty 20

## 2020-08-21 MED ORDER — AMOXICILLIN-POT CLAVULANATE 400-57 MG/5ML PO SUSR
800.0000 mg | Freq: Two times a day (BID) | ORAL | Status: DC
  Administered 2020-08-21: 800 mg via ORAL
  Filled 2020-08-21 (×4): qty 10

## 2020-08-21 MED ORDER — ENSURE ENLIVE PO LIQD
237.0000 mL | Freq: Three times a day (TID) | ORAL | Status: DC
  Administered 2020-08-21 – 2020-08-23 (×2): 237 mL via ORAL

## 2020-08-21 MED ORDER — METOPROLOL TARTRATE 25 MG/10 ML ORAL SUSPENSION
12.5000 mg | Freq: Two times a day (BID) | ORAL | Status: DC
  Filled 2020-08-21: qty 5

## 2020-08-21 MED ORDER — ADULT MULTIVITAMIN LIQUID CH
15.0000 mL | Freq: Every day | ORAL | Status: DC
  Filled 2020-08-21 (×3): qty 15

## 2020-08-21 NOTE — Progress Notes (Addendum)
PROGRESS NOTE    Casey Baldwin  VQQ:[id number] DOB: [birth year] DOA: 07/12/2020 PCP: Lesly Rubenstein, MD   Chief complaint.  Altered mental status. 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 Rash is intensely pruritic different stages of healing some areas of vesicular be red border. Some areas covered in black eschars. Skin is diffusely tender.Per family (asked by me) pt developed confusion. Was c/o side to back pain.  Sed rate was elevated. LP was considered but patient declined stating that she had a history of meningitis in the past and very scared of having LP done. Hospitalist was called for admission for shingles and possible encephalitis.  Patient was treated with IV acyclovir, she has so far completed the whole course. Due to poor p.o. intake, a Dobbhoff was placed on 9/13, tube feeding started. LP was performed on 9/14. Condition consistent with viral meningitis/encephalitis. EEG did not show any seizure activity. MRI of the brain without contrast on 9/14 showed nonspecific white matter changes, no large vessel occlusion on MRI of the brain.  Patient condition has been gradually improving, completed 21 days of IV acyclovir.  10/5-10/12.  Patient was continued on tube feeding.  She developed 1 episode of aspiration pneumonia, finishing up 5 days of antibiotics.  Dobbhoff was pulled off halfway by patient, removed evening of 10/11.  Patient is also evaluated by general surgery for PEG tube placement, deemed to be too risky due to multiple previous abdominal surgeries.   Assessment  & Plan:   Active Problems:   SIRS (systemic inflammatory response syndrome) (HCC)   Shingles rash   Delirium   Acute metabolic encephalopathy   Essential hypertension   Anemia   Acute lower UTI   Chronic diastolic CHF (congestive heart failure) (HCC)   Sepsis (HCC)   Change in mental status   Anorexia   Malnutrition of moderate degree   Hyponatremia   Obstructive sleep apnea   PSVT (paroxysmal supraventricular tachycardia) (HCC)   Thrombocytosis   Acute urinary retention   Aspiration pneumonia of both lower lobes due to gastric secretions (HCC)   Viral encephalitis  #1.  Aspiration pneumonia. Patient completed 2 days IV antibiotics, 1 day of oral antibiotics.  Lastly Augmentin on 10/14.  2.  Acute metabolic encephalopathy. Secondary to VZV encephalitis. Patient condition gradually improving, but mental status is still waxing and waning.  She is still very weak, confused.  She was seen by neurology.  Not sure how much she will improve. Care is seeing the patient.  #3.  Anorexia and poor p.o. intake with  moderate protein calorie malnutrition. Dobbhoff catheter was pulled out yesterday evening. Discussed with the nurse and the patient daughter, patient is fed.  But she probably only getting 25 to 50% of her meal. At this point, I will hold off replacing a Dobbhoff catheter. Add supplements between 3 meals.  Continue calorie count. We will determine before the weekend whether we need put back the Dobbhoff catheter.  #4.  Urinary retention. Attempt to remove the catheter about 10 days ago without success.  Continue Flomax.  Continue Foley catheter.  #5.  Shingles with aseptic meningitis, VZV encephalitis.  She has completed 21 days of acyclovir.  6.  Obstructive sleep apnea. Since patient no longer has a Dobbhoff, will restart CPAP while asleep.  7.  Chronic diastolic congestive heart failure. Patient received 40 mg of oral Lasix via feeding tube yesterday.  Currently does  not have volume overload.  We will continue to follow.  8.  Anemia of chronic disease.    DVT prophylaxis: Lovenox Code Status: Full Family Communication: Daughter updated. Disposition Plan:  .   Status is: Inpatient  Remains inpatient appropriate because:Inpatient level of care appropriate due to severity of illness.  Patient is not stable enough to be discharged. Spoke with Child psychotherapist, looking for [employer]   Dispo: The patient is from: Home              Anticipated d/c is to: SNF              Anticipated d/c date is: > 3 days              Patient currently is not medically stable to d/c.        I/O last 3 completed shifts: In: 904.2 [I.V.:604.2; IV Piggyback:300] Out: 3050 [Urine:3050] No intake/output data recorded.     Consultants:   Neurology, infectious disease.  Procedures: LP  Antimicrobials: Augmentin.  Subjective: Patient Dobbhoff catheter was pulled off halfway, then removed yesterday evening. Spoke with nurse, patient only had  25% of dinner last night.  He does not have any abdominal pain or nausea vomiting. She no longer feels short of breath today, she has no cough. No fever or chills. She has a Foley catheter in place. She no longer has any headache or dizziness.  Objective: Vitals:   08/20/20 2325 08/21/20 0500 08/21/20 0805 08/21/20 0900  BP: (!) 151/78  (!) 153/65   Pulse: 94  91   Resp: 20  18   Temp: 98.1 F (36.7 C)  (!) 97.3 F (36.3 C)   TempSrc: Oral  Axillary   SpO2: 100%  96% 95%  Weight:  75.9 kg    Height:        Intake/Output Summary (Last 24 hours) at 08/21/2020 0909 Last data filed at 08/21/2020 0500 Gross per 24 hour  Intake 93.24 ml  Output 3000 ml  Net -2906.76 ml   Filed Weights   08/19/20 0500 08/20/20 0404 08/21/20 0500  Weight: 78.1 kg 79 kg 75.9 kg    Examination:  General exam: Appears calm and comfortable, very weak Respiratory system: Clear to auscultation. Respiratory effort  normal. Cardiovascular system: S1 & S2 heard, RRR. No JVD, murmurs, rubs, gallops or clicks. No pedal edema. Gastrointestinal system: Abdomen is nondistended, soft and nontender. No organomegaly or masses felt. Normal bowel sounds heard. Central nervous system: Drowsy and oriented 2. No focal neurological deficits. Extremities: Symmetric  Skin: No rashes, lesions or ulcers     Data Reviewed: I have personally reviewed following labs and imaging studies  CBC: Recent Labs  Lab 08/15/20 0257 08/19/20 0542 08/21/20 0344  WBC 9.0 10.3 10.4  NEUTROABS  --  8.2* 8.2*  HGB 8.6* 8.8* 9.7*  HCT 27.3* 26.7* 28.9*  MCV 102.2* 99.3 97.3  PLT 322 274 366   Basic Metabolic Panel: Recent Labs  Lab 08/14/20 0912 08/15/20 0257 08/16/20 0256 08/19/20 0542 08/21/20 0344  NA 139 141 136 134* 134*  K  --  3.9 3.9 4.0 4.3  CL  --  105 99 100 99  CO2  --  28  28 24 24   GLUCOSE  --  152* 161* 139* 126*  BUN  --  36* 31* 28* 25*  CREATININE  --  0.42* 0.46 0.50 0.47  CALCIUM  --  9.3 9.0 8.8* 9.5  MG  --   --  2.1 2.0 2.0  PHOS  --   --  3.7 3.3  --    GFR: Estimated Creatinine Clearance: 56 mL/min (by C-G formula based on SCr of 0.47 mg/dL). Liver Function Tests: Recent Labs  Lab 08/15/20 0257  AST 110*  ALT 228*  ALKPHOS 57  BILITOT 0.4  PROT 6.3*  ALBUMIN 2.5*   No results for input(s): LIPASE, AMYLASE in the last 168 hours. No results for input(s): AMMONIA in the last 168 hours. Coagulation Profile: No results for input(s): INR, PROTIME in the last 168 hours. Cardiac Enzymes: Recent Labs  Lab 08/15/20 0257  CKTOTAL 82   BNP (last 3 results) No results for input(s): PROBNP in the last 8760 hours. HbA1C: No results for input(s): HGBA1C in the last 72 hours. CBG: Recent Labs  Lab 08/20/20 1623 08/20/20 2113 08/21/20 0004 08/21/20 0355 08/21/20 0802  GLUCAP 142* 143* 139* 124* 111*   Lipid Profile: No results for input(s): CHOL, HDL, LDLCALC, TRIG, CHOLHDL,  LDLDIRECT in the last 72 hours. Thyroid Function Tests: No results for input(s): TSH, T4TOTAL, FREET4, T3FREE, THYROIDAB in the last 72 hours. Anemia Panel: No results for input(s): VITAMINB12, FOLATE, FERRITIN, TIBC, IRON, RETICCTPCT in the last 72 hours. Sepsis Labs: No results for input(s): PROCALCITON, LATICACIDVEN in the last 168 hours.  No results found for this or any previous visit (from the past 240 hour(s)).       Radiology Studies: No results found.      Scheduled Meds: . amoxicillin-clavulanate  800 mg Per Tube Q12H  . Chlorhexidine Gluconate Cloth  6 each Topical Daily  . enoxaparin (LOVENOX) injection  40 mg Subcutaneous Q24H  . feeding supplement (ENSURE ENLIVE)  237 mL Oral TID BM  . free water  20 mL Per Tube Q6H  . insulin aspart  0-9 Units Subcutaneous Q4H  . lactulose  20 g Oral BID  . liver oil-zinc oxide   Topical BID  . megestrol  400 mg Oral Daily  . melatonin  5 mg Oral QHS  . metoprolol tartrate  12.5 mg Per Tube BID  . modafinil  100 mg Oral Daily  . multivitamin  15 mL Per Tube Daily  . pantoprazole (PROTONIX) IV  40 mg Intravenous Q24H  . sodium chloride flush  10-40 mL Intracatheter Q12H  . sodium chloride flush  3 mL Intravenous Q12H  . sodium chloride  1 g Oral BID WC  . tamsulosin  0.4 mg Oral QPC supper   Continuous Infusions: . sodium chloride 10 mL/hr at 08/20/20 1600  . feeding supplement (VITAL AF 1.2 CAL) 1,000 mL (08/19/20 0613)     LOS: 39 days    Time spent: 35 minutes    10/19/20, MD [facility]   To contact the attending provider between 7A-7P or the covering provider during after hours 7P-7A, please log into the web site [email or web address] and access using universal [place] password for that web site. If you do not have the password, please call the hospital operator.  08/21/2020, 9:09 AM

## 2020-08-21 NOTE — Progress Notes (Addendum)
Physical Therapy Treatment Patient Details Name: Casey Baldwin MRN: [id number] DOB: 16-Aug-1941 Today's Date: 08/21/2020    History of Present Illness Casey Baldwin is a 79 y.o. 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  Presented to ER secondary to generalized body aches, progressive weakness, headaches; admitted for management of SIRS (urine?), shingles and acute metabolic encephalopathy.    PT Comments    Session focused on bed mobility, activity tolerance, sustained attention, sitting balance for endurance/strengthening. Pt cries out in pain with palpation to R ankle/foot and with all movement but ceases crying when not moving. Pt requires HOH assist to place hand on bed rail to participate in rolling but with little attention to task & no initiation. Pt requires +2 assist for supine<>sit with hospital bed features & PT provides total assist for placing BLE feet on floor & orienting to midline & correcting posterior lean. Once assisted to upright sitting position pt is able to maintain for 8 minutes with CGA<>min assist. Pt continues to have significant cervical flexion & looks at floor despite PT attempting to manually correct & providing max verbal cuing. PT attempts to engage pt in functional tasks such as washing face but pt does not attempt to grasp washcloth. Pt is able to kick LLE out (2-/5) on command but otherwise does not consistently follow commands. Pt unable to verbalize name of visitor who was briefly in room nor pt's own birthday, but was able to initiate sentences but not finish them. Pt encouraged to get OOB during next PT session with this PT recommending trying slide board transfer bed>drop arm recliner if pt is alert & able to tolerate.   Addendum: pt left with HOB >30 degrees & bed locked in position.    Follow Up Recommendations  SNF;Supervision/Assistance - 24 hour     Equipment  Recommendations   (TBD in next venue)    Recommendations for Other Services       Precautions / Restrictions Precautions Precautions: Fall Restrictions Weight Bearing Restrictions: No Other Position/Activity Restrictions: HOB >30 degrees, aspiration, prevalon boots (no NG tube on 10/12)    Mobility  Bed Mobility Overal bed mobility: Needs Assistance Bed Mobility: Supine to Sit;Sit to Supine;Rolling Rolling: +2 for physical assistance;Max assist   Supine to sit: Max assist;+2 for physical assistance;+2 for safety/equipment;HOB elevated Sit to supine: Max assist;+2 for physical assistance;+2 for safety/equipment;HOB elevated      Transfers                    Ambulation/Gait                 Stairs             Wheelchair Mobility    Modified Rankin (Stroke Patients Only)       Balance Overall balance assessment: Needs assistance Sitting-balance support: Bilateral upper extremity supported;Feet supported Sitting balance-Leahy Scale: Poor Sitting balance - Comments: CGA<>min assist once pt obtained midline orientation Postural control: Posterior lean                                  Cognition Arousal/Alertness: Lethargic Behavior During Therapy: Flat affect Overall Cognitive Status:  (friend present in room reporting pt knows who she is but pt unable to recall visitor's name) Area of Impairment: Orientation;Attention;Following commands;Problem solving;Memory;Safety/judgement;Awareness  Orientation Level: Disoriented to;Place;Time;Situation (does not verbalize full name nor birthday when asked by PT)   Memory: Decreased short-term memory Following Commands: Follows one step commands inconsistently Safety/Judgement: Decreased awareness of safety;Decreased awareness of deficits Awareness: Intellectual Problem Solving: Slow processing;Decreased initiation;Difficulty sequencing;Requires verbal cues;Requires  tactile cues General Comments: increased processing time/initiation, seems to be [facility] but visitor reports no prior hearing issues      Exercises      General Comments General comments (skin integrity, edema, etc.): NG tube not in place today during session      Pertinent Vitals/Pain Pain Assessment: Faces Faces Pain Scale: Hurts whole lot Pain Location: RLE (ankle) Pain Descriptors / Indicators: Grimacing;Discomfort;Guarding Pain Intervention(s): Monitored during session;Repositioned    Home Living                      Prior Function            PT Goals (current goals can now be found in the care plan section) Acute Rehab PT Goals Patient Stated Goal: none stated, no family present PT Goal Formulation: Patient unable to participate in goal setting Time For Goal Achievement: 08/27/20 Potential to Achieve Goals: Fair Progress towards PT goals: Progressing toward goals (very slowly progressing towards goals)    Frequency    Min 2X/week      PT Plan Current plan remains appropriate    Co-evaluation              AM-PAC PT "6 Clicks" Mobility   Outcome Measure  Help needed turning from your back to your side while in a flat bed without using bedrails?: Total Help needed moving from lying on your back to sitting on the side of a flat bed without using bedrails?: Total Help needed moving to and from a bed to a chair (including a wheelchair)?: Total Help needed standing up from a chair using your arms (e.g., wheelchair or bedside chair)?: Total Help needed to walk in hospital room?: Total Help needed climbing 3-5 steps with a railing? : Total 6 Click Score: 6    End of Session   Activity Tolerance: Patient limited by pain;Patient limited by fatigue Patient left: in bed;with call bell/phone within reach;with bed alarm set;with SCD's reapplied (B prevalon boots donned)   PT Visit Diagnosis: Muscle weakness (generalized) (M62.81);Other abnormalities of  gait and mobility (R26.89);Pain Pain - Right/Left: Right Pain - part of body: Ankle and joints of foot     Time: 1417-1444 PT Time Calculation (min) (ACUTE ONLY): 27 min  Charges:  $Therapeutic Activity: 23-37 mins                     Aleda Grana, PT, DPT 08/21/20, 3:06 PM

## 2020-08-21 NOTE — Progress Notes (Signed)
08/21/20 0000 BS is 134. Insulin is administered via sliding scale. MAR cannot be updated at this time.

## 2020-08-21 NOTE — Progress Notes (Signed)
Attempted to put patient's home cpap on. Patient took off immediately. Will continue to monitor

## 2020-08-21 NOTE — Progress Notes (Signed)
Pt unable to tolerate PO medications. Dr. Chipper Herb made aware. No new orders.  Arlana Hove, RN

## 2020-08-22 ENCOUNTER — Inpatient Hospital Stay: Payer: Medicare Other

## 2020-08-22 LAB — GLUCOSE, CAPILLARY
Glucose-Capillary: 113 mg/dL — ABNORMAL HIGH (ref 70–99)
Glucose-Capillary: 114 mg/dL — ABNORMAL HIGH (ref 70–99)
Glucose-Capillary: 115 mg/dL — ABNORMAL HIGH (ref 70–99)
Glucose-Capillary: 134 mg/dL — ABNORMAL HIGH (ref 70–99)
Glucose-Capillary: 136 mg/dL — ABNORMAL HIGH (ref 70–99)
Glucose-Capillary: 138 mg/dL — ABNORMAL HIGH (ref 70–99)

## 2020-08-22 MED ORDER — DEXTROSE-NACL 5-0.9 % IV SOLN: INTRAVENOUS | Status: AC

## 2020-08-22 NOTE — Progress Notes (Signed)
Updated daughter via phone.  Will continue to update with any acute changes.

## 2020-08-22 NOTE — Progress Notes (Signed)
Physical Therapy Treatment Patient Details Name: Casey Baldwin MRN: [id number] DOB: 09-15-1941 Today's Date: 08/22/2020    History of Present Illness Casey Baldwin is a 79 y.o. 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  Presented to ER secondary to generalized body aches, progressive weakness, headaches; admitted for management of SIRS (urine?), shingles and acute metabolic encephalopathy.    PT Comments    Pt more alert & engaged in session on this date. Pt able to verbalize complete sentences & gives thumbs up when PT encourages pt to get to recliner. Pt's daughter Casey Baldwin) present & encouraging to pt throughout session Casey Baldwin reports hx of R ankle surgery ~1 year ago and pt had returned to [facility] after surgery but now ankle hurts more 2/2 lack of mobility). Pt requires +2 assist for bed mobility but is able to sit EOB with CGA. Pt with difficulty performing lateral lean onto elbow & requires total assist for slide board placement & +2 assist for lateral scoot bed>drop arm recliner with use of slide board. Pt continues to cry out in pain with RLE movement & overall movement as pt reports significant fear of falling & fear of movement - PT provides education/encouragement throughout session. Pt agreeable to sitting up in recliner & daughter plans to assist pt with lunch.   Pt would benefit from ongoing skilled PT intervention to focus on increasing independence & OOB tolerance.    Follow Up Recommendations  SNF;Supervision/Assistance - 24 hour     Equipment Recommendations   (TBD in next venue)    Recommendations for Other Services       Precautions / Restrictions Precautions Precautions: Fall Restrictions Weight Bearing Restrictions: No Other Position/Activity Restrictions: HOB >30 degrees, aspiration, prevalon boots (no NG tube on 10/13)    Mobility  Bed Mobility Overal bed mobility: Needs  Assistance Bed Mobility: Supine to Sit Rolling: +2 for physical assistance;Max assist         General bed mobility comments: fearful of movement (R ankle pain) & falling  Transfers Overall transfer level: Needs assistance Equipment used: Sliding board Transfers: Lateral/Scoot Transfers          Lateral/Scoot Transfers: Max assist;Total assist;+2 physical assistance;+2 safety/equipment General transfer comment: PT facilitating head/hips relationship & anterior weight shifting, pt requires max<>total assist to scoot across board but tolerated transfer well  Ambulation/Gait                 Stairs             Wheelchair Mobility    Modified Rankin (Stroke Patients Only)       Balance Overall balance assessment: Needs assistance Sitting-balance support: Feet supported;Bilateral upper extremity supported Sitting balance-Leahy Scale: Poor Sitting balance - Comments: CGA sitting EOB with BLE/BUE supported Postural control: Posterior lean                                  Cognition   Behavior During Therapy: Flat affect Overall Cognitive Status: Impaired/Different from baseline Area of Impairment: Orientation;Attention;Following commands;Problem solving;Memory;Safety/judgement;Awareness                 Orientation Level: Disoriented to;Place;Time;Situation   Memory: Decreased short-term memory Following Commands: Follows one step commands inconsistently Safety/Judgement: Decreased awareness of safety;Decreased awareness of deficits Awareness: Intellectual Problem Solving: Slow processing;Decreased initiation;Difficulty sequencing;Requires verbal cues;Requires tactile cues  Exercises      General Comments General comments (skin integrity, edema, etc.): NG tube not in place today, pt's daughter present & supportive & encouraging to pt      Pertinent Vitals/Pain Pain Assessment: Faces Faces Pain Scale: Hurts even more Pain  Location: RLE (ankle) Pain Descriptors / Indicators: Grimacing;Discomfort;Guarding Pain Intervention(s): Monitored during session;Repositioned    Home Living                      Prior Function            PT Goals (current goals can now be found in the care plan section) Acute Rehab PT Goals Patient Stated Goal: none stated by pt PT Goal Formulation: Patient unable to participate in goal setting Time For Goal Achievement: 08/27/20 Potential to Achieve Goals: Fair Progress towards PT goals: Progressing toward goals    Frequency    Min 2X/week      PT Plan Current plan remains appropriate    Co-evaluation              AM-PAC PT "6 Clicks" Mobility   Outcome Measure  Help needed turning from your back to your side while in a flat bed without using bedrails?: Total Help needed moving from lying on your back to sitting on the side of a flat bed without using bedrails?: Total Help needed moving to and from a bed to a chair (including a wheelchair)?: Total Help needed standing up from a chair using your arms (e.g., wheelchair or bedside chair)?: Total Help needed to walk in hospital room?: Total Help needed climbing 3-5 steps with a railing? : Total 6 Click Score: 6    End of Session   Activity Tolerance: Patient tolerated treatment well;Patient limited by pain Patient left: with call bell/phone within reach;with SCD's reapplied;in chair;with chair alarm set;with family/visitor present Nurse Communication: Mobility status;Need for lift equipment (NT notified) PT Visit Diagnosis: Muscle weakness (generalized) (M62.81);Other abnormalities of gait and mobility (R26.89);Pain Pain - Right/Left: Right Pain - part of body: Ankle and joints of foot     Time: 1123-1150 PT Time Calculation (min) (ACUTE ONLY): 27 min  Charges:  $Therapeutic Activity: 23-37 mins                     Aleda Grana, PT, DPT 08/22/20, 12:08 PM

## 2020-08-22 NOTE — Progress Notes (Signed)
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 Rash is intensely pruritic different stages of healing some areas of vesicular be red border. Some areas covered in black eschars. Skin is diffusely tender.Per family (asked by me) pt developed confusion. Was c/o side to back pain.  Sed rate was elevated. LP was considered but patient declined stating that she had a history of meningitis in the past and very scared of having LP done. Hospitalist was called for admission for shingles and possible encephalitis.  Patient was treated with IV acyclovir, she has so far completed the whole course. Due to poor p.o. intake, a Dobbhoff was placed on 9/13, tube feeding started. LP was performed on 07/24/20. Condition consistent with viral meningitis/encephalitis. EEG did not show any seizure activity. MRI of the brain without contrast on 9/14 showed nonspecific white matter changes, no large vessel occlusion on MRI of the brain.  Patient condition has been gradually improving, completed 21 days of IV acyclovir.  10/5-10/11.  Patient received tube feeding.  She developed 1 episode of aspiration.  Was started on antibiotics.  She has refused to take in the last 48 hours.  Seen by ID.  Dobbhoff was pulled off halfway by patient, removed evening of 10/11.  Patient was evaluated by general surgery for PEG tube placement, deemed to be too risky due to multiple previous abdominal surgeries.  08/22/20:   Seen in her room, somnolent but easily arousable to voices.  States she is cold.  Refuses to take her po abx augmentin today.  Contacted palliative care team to assist with discussion with daughter regarding goals of care.    Assessment/Plan: Active Problems:   SIRS (systemic inflammatory response syndrome) (HCC)   Shingles rash   Delirium   Acute metabolic encephalopathy   Essential hypertension   Anemia   Acute lower UTI   Chronic diastolic CHF (congestive heart failure) (HCC)   Sepsis (HCC)   Change in mental status   Anorexia   Malnutrition of moderate degree   Hyponatremia   Obstructive sleep apnea   PSVT (paroxysmal supraventricular tachycardia) (HCC)   Thrombocytosis   Acute urinary retention   Aspiration pneumonia of both lower lobes due to gastric secretions (HCC)   Viral encephalitis  Acute metabolic encephalopathy, multifactorial secondary to viral encephalitis, aspiration pneumonia, acute illness Completed course of acyclovir for viral encephalitis Completed 2 days of IV antibiotics and 1 day of oral antibiotics for aspiration pneumonia Has refused to take her oral antibiotics today. Confused this morning Continue delirium precautions Dubhoff removed on 08/20/20 evening to decrease risk for aspiration.  Dysphagia with poor oral intake Evaluated by speech therapy with recommendation for dysphagia 1 pure, thin liquid diet Aspiration precautions Encourage oral intake Feeding assistance  Possible aspiration 08/20/20 Tube feeding removed since then Received 2 days of IV Unasyn and 1 day of augmentin Currently afebrile Has refused her oral abx today Defer to ID to restart antibiotics Continue to monitor fever curve and WBC.  Failure to thrive in an adult Minimal oral intake Continue  diet as recommended above Continue oral supplements We will start gentle IV fluid hydration NSD5W at 50cc/hr to avoid dehydration and hypoglycemia. PT OT rec SNF  Normocytic  anemia Hemoglobin 9.7 MCV 97 Overt bleeding Continue to monitor  Goals of care Palliative care medicine following DNR Poor prognosis in the setting of persistent encephalopathy, shock, poor oral intake poor functional status.  OSA CPAP qhs  Improved hypovolemic hyponatremia Serum Na+ 134 Monitor  Seen by Nephrology      Code Status: DNR  Family Communication: None at bedside  Disposition Plan: Discharge to SNF if patient's family is agreeable.   Consultants:  Neurology  Nephrology  Procedures:  Lumbar puncture  Antimicrobials:  None  DVT prophylaxis: Subcu Lovenox daily  Status is: Inpatient    Dispo: The patient is from: Home.              Anticipated d/c is to: SNF.              Anticipated d/c date is: 08/23/2020.              Patient currently not stable for discharge due to ongoing work-up of acute metabolic encephalopathy.       Objective: Vitals:   08/21/20 0900 08/21/20 1539 08/22/20 0137 08/22/20 0738  BP:  (!) 146/73  134/80  Pulse:  88  91  Resp:  16  19  Temp:  98.7 F (37.1 C)  97.8 F (36.6 C)  TempSrc:    Oral  SpO2: 95% 99%  98%  Weight:   75.7 kg   Height:        Intake/Output Summary (Last 24 hours) at 08/22/2020 1319 Last data filed at 08/22/2020 1025 Gross per 24 hour  Intake 120 ml  Output 350 ml  Net -230 ml   Filed Weights   08/20/20 0404 08/21/20 0500 08/22/20 0137  Weight: 79 kg 75.9 kg 75.7 kg    Exam:  . General: 79 y.o. year-old female well developed well nourished in no acute distress.  Somnolent and arousable to voices. . Cardiovascular: Regular rate and rhythm with no rubs or gallops.  No thyromegaly or JVD noted.   Marland Kitchen Respiratory: Clear to auscultation with no wheezes or rales. Poor inspiratory effort. . Abdomen: Soft nontender nondistended with normal bowel sounds x4 quadrants. . Musculoskeletal: No lower extremity edema bilaterally . Psychiatry: Mood is appropriate for condition and  setting   Data Reviewed: CBC: Recent Labs  Lab 08/19/20 0542 08/21/20 0344  WBC 10.3 10.4  NEUTROABS 8.2* 8.2*  HGB 8.8* 9.7*  HCT 26.7* 28.9*  MCV 99.3 97.3  PLT 274 366   Basic Metabolic Panel: Recent Labs  Lab 08/16/20 0256 08/19/20 0542 08/21/20 0344  NA 136 134* 134*  K 3.9 4.0 4.3  CL 99 100 99  CO2 28 24 24   GLUCOSE 161* 139* 126*  BUN 31* 28* 25*  CREATININE 0.46 0.50 0.47  CALCIUM 9.0 8.8* 9.5  MG 2.1 2.0 2.0  PHOS 3.7 3.3  --    GFR: Estimated Creatinine Clearance: 55.9 mL/min (by C-G formula based on SCr of 0.47 mg/dL). Liver Function Tests: No results for input(s): AST, ALT, ALKPHOS, BILITOT, PROT, ALBUMIN in the last 168 hours. No results for input(s): LIPASE, AMYLASE in the last 168 hours. No results for input(s): AMMONIA in the last 168 hours. Coagulation Profile: No results for input(s): INR, PROTIME in the last 168 hours. Cardiac Enzymes: No results for input(s): CKTOTAL, CKMB, CKMBINDEX, TROPONINI in the last  168 hours. BNP (last 3 results) No results for input(s): PROBNP in the last 8760 hours. HbA1C: No results for input(s): HGBA1C in the last 72 hours. CBG: Recent Labs  Lab 08/21/20 2100 08/21/20 2320 08/22/20 0414 08/22/20 0740 08/22/20 1150  GLUCAP 131* 116* 114* 113* 134*   Lipid Profile: No results for input(s): CHOL, HDL, LDLCALC, TRIG, CHOLHDL, LDLDIRECT in the last 72 hours. Thyroid Function Tests: No results for input(s): TSH, T4TOTAL, FREET4, T3FREE, THYROIDAB in the last 72 hours. Anemia Panel: No results for input(s): VITAMINB12, FOLATE, FERRITIN, TIBC, IRON, RETICCTPCT in the last 72 hours. Urine analysis:    Component Value Date/Time   COLORURINE YELLOW (A) 08/09/2020 1525   APPEARANCEUR HAZY (A) 08/09/2020 1525   LABSPEC 1.027 08/09/2020 1525   PHURINE 5.0 08/09/2020 1525   GLUCOSEU NEGATIVE 08/09/2020 1525   HGBUR NEGATIVE 08/09/2020 1525   BILIRUBINUR NEGATIVE 08/09/2020 1525   KETONESUR NEGATIVE 08/09/2020  1525   PROTEINUR NEGATIVE 08/09/2020 1525   NITRITE NEGATIVE 08/09/2020 1525   LEUKOCYTESUR TRACE (A) 08/09/2020 1525   Sepsis Labs: @LABRCNTIP (procalcitonin:4,lacticidven:4)  )No results found for this or any previous visit (from the past 240 hour(s)).    Studies: No results found.  Scheduled Meds: . Chlorhexidine Gluconate Cloth  6 each Topical Daily  . enoxaparin (LOVENOX) injection  40 mg Subcutaneous Q24H  . feeding supplement  237 mL Oral TID BM  . insulin aspart  0-9 Units Subcutaneous Q4H  . lactulose  20 g Oral BID  . liver oil-zinc oxide   Topical BID  . megestrol  400 mg Oral Daily  . melatonin  5 mg Oral QHS  . metoprolol tartrate  12.5 mg Oral BID  . modafinil  100 mg Oral Daily  . multivitamin  15 mL Oral Daily  . pantoprazole sodium  40 mg Oral Daily  . sodium chloride flush  10-40 mL Intracatheter Q12H  . sodium chloride flush  3 mL Intravenous Q12H  . sodium chloride  1 g Oral BID WC  . tamsulosin  0.4 mg Oral QPC supper    Continuous Infusions: . sodium chloride 10 mL/hr at 08/20/20 1600  . feeding supplement (VITAL AF 1.2 CAL) 1,000 mL (08/19/20 10/19/20)     LOS: 40 days     [id number], MD [facility] Pager [phone]  If 7PM-7AM, please contact night-coverage [email or web address] Password [email or web address] 08/22/2020, 1:19 PM

## 2020-08-22 NOTE — Plan of Care (Signed)
  Problem: Education: Goal: Knowledge of General Education information will improve Description: Including pain rating scale, medication(s)/side effects and non-pharmacologic comfort measures Outcome: Not Progressing   Problem: Health Behavior/Discharge Planning: Goal: Ability to manage health-related needs will improve Outcome: Not Progressing   Problem: Coping: Goal: Level of anxiety will decrease Outcome: Not Progressing   Problem: Elimination: Goal: Will not experience complications related to bowel motility Outcome: Not Progressing

## 2020-08-22 NOTE — Progress Notes (Signed)
Patient refused to take medications. Daughter berated this nurse regarding straws. Education given straws and correlation to aspiration PNA. Not accepting of education given.

## 2020-08-23 DIAGNOSIS — F05 Delirium due to known physiological condition: Secondary | ICD-10-CM

## 2020-08-23 LAB — CBC WITH DIFFERENTIAL/PLATELET
Abs Immature Granulocytes: 0.22 10*3/uL — ABNORMAL HIGH (ref 0.00–0.07)
Basophils Absolute: 0 10*3/uL (ref 0.0–0.1)
Basophils Relative: 0 %
Eosinophils Absolute: 0.3 10*3/uL (ref 0.0–0.5)
Eosinophils Relative: 2 %
HCT: 28.9 % — ABNORMAL LOW (ref 36.0–46.0)
Hemoglobin: 9.9 g/dL — ABNORMAL LOW (ref 12.0–15.0)
Immature Granulocytes: 2 %
Lymphocytes Relative: 12 %
Lymphs Abs: 1.3 10*3/uL (ref 0.7–4.0)
MCH: 32.9 pg (ref 26.0–34.0)
MCHC: 34.3 g/dL (ref 30.0–36.0)
MCV: 96 fL (ref 80.0–100.0)
Monocytes Absolute: 0.7 10*3/uL (ref 0.1–1.0)
Monocytes Relative: 7 %
Neutro Abs: 8.3 10*3/uL — ABNORMAL HIGH (ref 1.7–7.7)
Neutrophils Relative %: 77 %
Platelets: 429 10*3/uL — ABNORMAL HIGH (ref 150–400)
RBC: 3.01 MIL/uL — ABNORMAL LOW (ref 3.87–5.11)
RDW: 14.4 % (ref 11.5–15.5)
WBC: 10.8 10*3/uL — ABNORMAL HIGH (ref 4.0–10.5)
nRBC: 0 % (ref 0.0–0.2)

## 2020-08-23 LAB — GLUCOSE, CAPILLARY
Glucose-Capillary: 121 mg/dL — ABNORMAL HIGH (ref 70–99)
Glucose-Capillary: 125 mg/dL — ABNORMAL HIGH (ref 70–99)
Glucose-Capillary: 155 mg/dL — ABNORMAL HIGH (ref 70–99)
Glucose-Capillary: 145 mg/dL — ABNORMAL HIGH (ref 70–99)
Glucose-Capillary: 139 mg/dL — ABNORMAL HIGH (ref 70–99)

## 2020-08-23 LAB — PROCALCITONIN: Procalcitonin: <0.1 ng/mL

## 2020-08-23 MED ORDER — PANTOPRAZOLE SODIUM 40 MG PO TBEC
40.0000 mg | DELAYED_RELEASE_TABLET | Freq: Every day | ORAL | Status: DC
  Administered 2020-08-24: 40 mg via ORAL
  Filled 2020-08-23: qty 1

## 2020-08-23 MED ORDER — NYSTATIN 100000 UNIT/ML MT SUSP
5.0000 mL | Freq: Four times a day (QID) | OROMUCOSAL | Status: DC
  Administered 2020-08-23 – 2020-08-24 (×3): 500000 [IU] via ORAL
  Filled 2020-08-23 (×3): qty 5

## 2020-08-23 MED ORDER — PHENOL 1.4 % MT LIQD
1.0000 | OROMUCOSAL | Status: DC | PRN
  Administered 2020-08-24: 1 via OROMUCOSAL
  Filled 2020-08-23: qty 177

## 2020-08-23 MED ORDER — METOPROLOL TARTRATE 25 MG PO TABS
12.5000 mg | ORAL_TABLET | Freq: Two times a day (BID) | ORAL | Status: DC
  Administered 2020-08-23 – 2020-08-24 (×2): 12.5 mg via ORAL
  Filled 2020-08-23 (×2): qty 1

## 2020-08-23 MED ORDER — COLLAGENASE 250 UNIT/GM EX OINT
TOPICAL_OINTMENT | Freq: Every day | CUTANEOUS | Status: DC
  Filled 2020-08-23: qty 30

## 2020-08-23 MED ORDER — CAMPHOR-MENTHOL 0.5-0.5 % EX LOTN
TOPICAL_LOTION | CUTANEOUS | Status: DC | PRN
  Filled 2020-08-23: qty 222

## 2020-08-23 MED ORDER — KATE FARMS STANDARD 1.4 PO LIQD
325.0000 mL | Freq: Two times a day (BID) | ORAL | Status: DC
  Filled 2020-08-23: qty 325

## 2020-08-23 MED ORDER — SODIUM CHLORIDE 0.9% FLUSH
10.0000 mL | INTRAVENOUS | Status: DC | PRN
  Administered 2020-08-23 (×2): 10 mL

## 2020-08-23 MED ORDER — ADULT MULTIVITAMIN W/MINERALS CH
1.0000 | ORAL_TABLET | Freq: Every day | ORAL | Status: DC
  Administered 2020-08-24: 1 via ORAL
  Filled 2020-08-23: qty 1

## 2020-08-23 MED ORDER — ENSURE ENLIVE PO LIQD: 237.0000 mL | ORAL | Status: DC

## 2020-08-23 NOTE — Plan of Care (Signed)
  Problem: Education: Goal: Knowledge of General Education information will improve Description: Including pain rating scale, medication(s)/side effects and non-pharmacologic comfort measures 08/23/2020 1854 by Jean Rosenthal, RN Outcome: Progressing 08/23/2020 1243 by Jean Rosenthal, RN Outcome: Not Progressing   Problem: Health Behavior/Discharge Planning: Goal: Ability to manage health-related needs will improve 08/23/2020 1854 by Jean Rosenthal, RN Outcome: Progressing 08/23/2020 1243 by Jean Rosenthal, RN Outcome: Not Progressing   Problem: Clinical Measurements: Goal: Ability to maintain clinical measurements within normal limits will improve 08/23/2020 1854 by Jean Rosenthal, RN Outcome: Progressing 08/23/2020 1243 by Jean Rosenthal, RN Outcome: Not Progressing Goal: Will remain free from infection 08/23/2020 1854 by Jean Rosenthal, RN Outcome: Progressing 08/23/2020 1243 by Jean Rosenthal, RN Outcome: Not Progressing Goal: Diagnostic test results will improve 08/23/2020 1854 by Jean Rosenthal, RN Outcome: Progressing 08/23/2020 1243 by Jean Rosenthal, RN Outcome: Not Progressing Goal: Respiratory complications will improve 08/23/2020 1854 by Jean Rosenthal, RN Outcome: Progressing 08/23/2020 1243 by Jean Rosenthal, RN Outcome: Not Progressing Goal: Cardiovascular complication will be avoided 08/23/2020 1854 by Jean Rosenthal, RN Outcome: Progressing 08/23/2020 1243 by Jean Rosenthal, RN Outcome: Not Progressing  Throat pain that prevents pt from tolerating oral - ice pack to throat are was helpful at times - tylenol was given pr.

## 2020-08-23 NOTE — Consult Note (Addendum)
WOC Nurse Consult Note: Reason for Consult: Consult requested for sacrum.  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  She was previously incontinent and now has a foley to contain urine. Assessed wound appearance and discussed with daughter at the bedside.  Secure chat message sent to primary team: "Patient has developed an unstageable pressure injury to the sacrum.  Please enter a photo into the EMR to assist with documentation if possible." Pressure Injury POA: No Measurement: 2X4cm; 50% red, 50% tightly adhered yellow slough, mod amt tan drainage, no odor.  There are 2 areas of unstageable pressure injuries separated in the middle with a narrow intact skin bridge.  Periwound: Red moist macerated skin surrounding the wound to approx 2 cm with red moist patchy areas of partial thickness skin loss; appearance is consistent with moisture associated skin damage.  Left upper outer thigh with a wound; appears to be a previous blister which has ruptured and evolved into a full thickness wound; 1.5X1.5X.1cm, red and moist, small amt tan drainage, no odor. Dressing procedure/placement/frequency: Topical treatment orders provided for bedside nurses to perform as follows to provide enzymatic debridement: Apply Santyl to sacrum wound Q day, then cover with moist fluffed gauze and foam dressing.  Foam dressing to protect and promote healing to left outer thigh wound. Air mattress to reduce pressure. WOC team will assess the location weekly to determine if a change in plan of care is indicated at that time.   Cammie Mcgee MSN, RN, CWOCN, [place], CNS [phone]

## 2020-08-23 NOTE — Progress Notes (Signed)
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 Rash is intensely pruritic different stages of healing some areas of vesicular be red border. Some areas covered in black eschars. Skin is diffusely tender.Per family (asked by me) pt developed confusion. Was c/o side to back pain.  Sed rate was elevated. LP was considered but patient declined stating that she had a history of meningitis in the past and very scared of having LP done. Hospitalist was called for admission for shingles and possible encephalitis.  Patient was treated with IV acyclovir, she has so far completed the whole course. Due to poor p.o. intake, a Dobbhoff was placed on 9/13, tube feeding started. LP was performed on 07/24/20. Condition consistent with viral meningitis/encephalitis. EEG did not show any seizure activity. MRI of the brain without contrast on 9/14 showed nonspecific white matter changes, no large vessel occlusion on MRI of the brain.  Patient condition has been gradually improving, completed 21 days of IV acyclovir.  10/5-10/11.  Patient received tube feeding.  She developed 1 episode of aspiration.  Was started on antibiotics.  She has refused to take in the last 48 hours.  Seen by ID.  Dobbhoff was pulled off halfway by patient, removed evening of 10/11.  Patient was evaluated by general surgery for PEG tube placement, deemed to be too risky due to multiple previous abdominal surgeries.  08/23/20:   Seen in her room, alert and minimally interactive.  Had a lengthy conversation with her daughter at bedside.  Concern for possible depression which may be affecting her desire to eat.  Psychiatry consulted.  She has consistently had poor oral intake.  Currently on Megace.  Assessment/Plan: Active Problems:   SIRS (systemic inflammatory response syndrome) (HCC)   Shingles rash   Delirium   Acute metabolic encephalopathy   Essential hypertension   Anemia   Acute lower UTI   Chronic diastolic CHF (congestive heart failure) (HCC)   Sepsis (HCC)   Change in mental status   Anorexia   Malnutrition of moderate degree   Hyponatremia   Obstructive sleep apnea   PSVT (paroxysmal supraventricular tachycardia) (HCC)   Thrombocytosis   Acute urinary retention   Aspiration pneumonia of both lower lobes due to gastric secretions (HCC)   Viral encephalitis  Acute metabolic encephalopathy, multifactorial secondary to acute illness, viral encephalitis, aspiration pneumonia, 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 Completed course of acyclovir for viral encephalitis Completed 2 days of IV antibiotics and 1 day of oral antibiotics for aspiration pneumonia Personally reviewed chest x-ray done on 08/22/2020, showing improvement of lung aeration. Afebrile with no leukocytosis, procalcitonin less than 0.10 Currently off antibiotics, will continue to observe off antibiotics. She is more alert and interactive, this morning. Continue delirium precautions Dubhoff removed on 08/20/20 evening to decrease risk for aspiration.  Dysphagia and anorexia, suspect a component of depression Evaluated by speech therapy with recommendation for dysphagia 1 pure, thin liquid diet Aspiration precautions and feeding assistance in place Encourage oral intake  Acute urinary retention post Foley catheter placement placed on 08/08/20 Continue to  monitor urine output Currently on Flomax.  Possible depression Not on antidepressant at  home Psychiatry consulted  Possible aspiration 08/20/20 Tube feeding removed since then Received 2 days of IV Unasyn and 1 day of augmentin Currently afebrile, no leukocytosis and no evidence of active infective process.  Failure to thrive in an adult Minimal oral intake Continue diet as recommended above Continue oral supplements We will start gentle IV fluid hydration NSD5W, increase rate to 75 cc/h to avoid dehydration and hypoglycemia. PT OT rec SNF TOC assisting with SNF placement.  Normocytic anemia Hemoglobin uptrending 9.9 from 9.7  No overt bleeding. Continue to monitor  Goals of care Palliative care medicine following DNR Poor prognosis in the setting of persistent encephalopathy, shock, poor oral intake poor functional status.  OSA CPAP qhs  Improved hypovolemic hyponatremia Serum Na+ 134 Monitor  Seen by Nephrology Obtain BMP in the morning      Code Status: DNR  Family Communication: Updated her daughter at bedside.  Disposition Plan: Discharge to SNF after psych evaluation.   Consultants:  Neurology  Nephrology  Psychiatry consulted on 08/23/2020  Procedures:  Lumbar puncture  Antimicrobials:  None  DVT prophylaxis: Subcu Lovenox daily  Status is: Inpatient    Dispo: The patient is from: Home.              Anticipated d/c is to: SNF.              Anticipated d/c date is: 08/24/2020, pending bed placement.              Patient currently not stable for discharge, possible depression will be evaluated by psychiatry.       Objective: Vitals:   08/22/20 1619 08/22/20 2331 08/23/20 0522 08/23/20 0751  BP: 132/66 140/73  127/79  Pulse: 96 97  90  Resp: 18 20  19   Temp: (!) 97.5 F (36.4 C) 98.7 F (37.1 C)  98.3 F (36.8 C)  TempSrc: Oral   Oral  SpO2: 99% 98%  98%  Weight:   71.8 kg   Height:        Intake/Output Summary (Last 24 hours) at 08/23/2020 1306 Last data filed at 08/23/2020 1017 Gross per 24 hour   Intake 822.96 ml  Output 795 ml  Net 27.96 ml   Filed Weights   08/21/20 0500 08/22/20 0137 08/23/20 0522  Weight: 75.9 kg 75.7 kg 71.8 kg    Exam:  . General: 79 y.o. year-old female well-developed nourished no distress.  Alert and minimally interactive.  .  Cardiovascular: Regular rate and rhythm no rubs or gallops..   . Respiratory: Clear to auscultation no wheezes or rales.   . Abdomen: Soft nontender normal bowel sounds present  . musculoskeletal: No lower extremity edema bilaterally . Psychiatry: Mood is appropriate for condition and setting.   Data Reviewed: CBC: Recent Labs  Lab 08/19/20 0542 08/21/20 0344 08/23/20 0625  WBC 10.3 10.4 10.8*  NEUTROABS 8.2* 8.2* 8.3*  HGB 8.8* 9.7* 9.9*  HCT 26.7* 28.9* 28.9*  MCV 99.3 97.3 96.0  PLT 274 366 429*   Basic Metabolic Panel: Recent Labs  Lab 08/19/20 0542 08/21/20 0344  NA 134* 134*  K 4.0 4.3  CL 100 99  CO2 24 24  GLUCOSE 139* 126*  BUN 28* 25*  CREATININE 0.50 0.47  CALCIUM 8.8* 9.5  MG 2.0 2.0  PHOS 3.3  --    GFR: Estimated Creatinine Clearance: 54.5 mL/min (by C-G formula based on SCr of 0.47  mg/dL). Liver Function Tests: No results for input(s): AST, ALT, ALKPHOS, BILITOT, PROT, ALBUMIN in the last 168 hours. No results for input(s): LIPASE, AMYLASE in the last 168 hours. No results for input(s): AMMONIA in the last 168 hours. Coagulation Profile: No results for input(s): INR, PROTIME in the last 168 hours. Cardiac Enzymes: No results for input(s): CKTOTAL, CKMB, CKMBINDEX, TROPONINI in the last 168 hours. BNP (last 3 results) No results for input(s): PROBNP in the last 8760 hours. HbA1C: No results for input(s): HGBA1C in the last 72 hours. CBG: Recent Labs  Lab 08/22/20 2049 08/22/20 2335 08/23/20 0407 08/23/20 0750 08/23/20 1148  GLUCAP 138* 136* 121* 125* 155*   Lipid Profile: No results for input(s): CHOL, HDL, LDLCALC, TRIG, CHOLHDL, LDLDIRECT in the last 72 hours. Thyroid  Function Tests: No results for input(s): TSH, T4TOTAL, FREET4, T3FREE, THYROIDAB in the last 72 hours. Anemia Panel: No results for input(s): VITAMINB12, FOLATE, FERRITIN, TIBC, IRON, RETICCTPCT in the last 72 hours. Urine analysis:    Component Value Date/Time   COLORURINE YELLOW (A) 08/09/2020 1525   APPEARANCEUR HAZY (A) 08/09/2020 1525   LABSPEC 1.027 08/09/2020 1525   PHURINE 5.0 08/09/2020 1525   GLUCOSEU NEGATIVE 08/09/2020 1525   HGBUR NEGATIVE 08/09/2020 1525   BILIRUBINUR NEGATIVE 08/09/2020 1525   KETONESUR NEGATIVE 08/09/2020 1525   PROTEINUR NEGATIVE 08/09/2020 1525   NITRITE NEGATIVE 08/09/2020 1525   LEUKOCYTESUR TRACE (A) 08/09/2020 1525   Sepsis Labs: @LABRCNTIP (procalcitonin:4,lacticidven:4)  )No results found for this or any previous visit (from the past 240 hour(s)).    Studies: DG Chest Port 1 View  Result Date: 08/22/2020 CLINICAL DATA:  Increased breath sounds EXAM: PORTABLE CHEST 1 VIEW COMPARISON:  08/18/2020 FINDINGS: Cardiac shadow is stable. Feeding catheter has been removed in the interval. Improved aeration in the bases bilaterally is noted although some platelike atelectasis remains. No sizable effusion is noted. No bony abnormality is seen. IMPRESSION: Overall improved aeration although some basilar platelike atelectasis remains. Electronically Signed   By: 10/18/2020 M.D.   On: 08/22/2020 18:39    Scheduled Meds: . Chlorhexidine Gluconate Cloth  6 each Topical Daily  . collagenase   Topical Daily  . enoxaparin (LOVENOX) injection  40 mg Subcutaneous Q24H  . feeding supplement  237 mL Oral TID BM  . insulin aspart  0-9 Units Subcutaneous Q4H  . lactulose  20 g Oral BID  . liver oil-zinc oxide   Topical BID  . megestrol  400 mg Oral Daily  . melatonin  5 mg Oral QHS  . metoprolol tartrate  12.5 mg Oral BID  . modafinil  100 mg Oral Daily  . multivitamin  15 mL Oral Daily  . nystatin  5 mL Oral QID  . pantoprazole sodium  40 mg Oral Daily   . sodium chloride flush  10-40 mL Intracatheter Q12H  . sodium chloride flush  3 mL Intravenous Q12H  . sodium chloride  1 g Oral BID WC  . tamsulosin  0.4 mg Oral QPC supper    Continuous Infusions: . sodium chloride Stopped (08/21/20 0539)  . dextrose 5 % and 0.9% NaCl Stopped (08/23/20 1030)     LOS: 41 days     08/25/20, MD [facility] Pager [phone]  If 7PM-7AM, please contact night-coverage [email or web address] Password [email or web address] 08/23/2020, 1:06 PM

## 2020-08-23 NOTE — Progress Notes (Signed)
ID Pt  alert today Coherent speech but lapses into silence and does not answer all questions   Patient Vitals for the past 24 hrs:  BP Temp Temp src Pulse Resp SpO2 Weight  08/23/20 1649 139/76 98.5 F (36.9 C) Oral 93 18 99 % --  08/23/20 0751 127/79 98.3 F (36.8 C) Oral 90 19 98 % --  08/23/20 0522 -- -- -- -- -- -- 71.8 kg  08/22/20 2331 140/73 98.7 F (37.1 C) -- 97 20 98 % --    Chest b/l air entry Rt midline [id number] Abd soft abd wall hernia Moves upper extremities well But not lower extremities stiff   Labs CBC Latest Ref Rng & Units 08/23/2020 08/21/2020 08/19/2020  WBC 4.0 - 10.5 K/uL 10.8(H) 10.4 10.3  Hemoglobin 12.0 - 15.0 g/dL 2.9(J) 1.8(A) 4.1(Y)  Hematocrit 36 - 46 % 28.9(L) 28.9(L) 26.7(L)  Platelets 150 - 400 K/uL 429(H) 366 274    CMP Latest Ref Rng & Units 08/21/2020 08/19/2020 08/16/2020  Glucose 70 - 99 mg/dL 606(T) 016(W) 109(N)  BUN 8 - 23 mg/dL 23(F) 57(D) 22(G)  Creatinine 0.44 - 1.00 mg/dL 2.54 2.70 6.23  Sodium 135 - 145 mmol/L 134(L) 134(L) 136  Potassium 3.5 - 5.1 mmol/L 4.3 4.0 3.9  Chloride 98 - 111 mmol/L 99 100 99  CO2 22 - 32 mmol/L 24 24 28   Calcium 8.9 - 10.3 mg/dL 9.5 ) 9.0  Total Protein 6.5 - 8.1 g/dL - - -  Total Bilirubin 0.3 - 1.2 mg/dL - - -  Alkaline Phos 38 - 126 U/L - - -  AST 15 - 41 U/L - - -  ALT 0 - 44 U/L - - -    Assessment/Plan:  Prolonged hospitalization with encephalopathy with risk for aspiration No sign of pneumonia She need to sit up and do incentive spirometry  Hypoactive delirium following VZV meningoencephalitis - fluctuating level of alertness- overall improving alertness in the past week.   Periods of lucidity and interaction with somnolence. She needs constant stimulation and activity Need PT/OT/speech, OOB, breathing exercise,Regular bowel movts, Na optimization andcognitive re[facility]  Admitted on 9/12 with chest pain rt side, fever, headache and leg painandintermittent confusion   Found to have herpes zoster Thoracic dermatome  Herpes zoster with aseptic meningitis on admission ( patient had refused LPinitially)- was started on IV acyclovir 10mg /Kg q 8. She was alert and verbal and appropriate on presentation but after 5-6 days gradual deterioration to obtundation. Seen by neuro- delirium was questioned -EEG on 07/20/20 no seizure activity..There were many reasons including changes in sleep rhythm, poor intake, constipation, not wearing CPAp ( but no co2 narcosis) hyponatremia for the obtundation On 9/14 Had LP which revealed lymphocytic pleocytosis bacterial culture neg and HSV DNA neg- VZVneg .VzV ig G very high> 4000 west Nile virus IgG in csf was positive but not IgM and serum antibodies neg- so this isless likelyacute WNV and IgG is not a reliable test for diagnosis of acute infection as there is cross reactivity with other flavi virus and dengue-There is no treatment for WNV.  MRI and MRA done on 9/14 no acute changes. Daughter wanted her to be in 10/14, but no bed andapparently [facility] has declined [facility] transfer Repeat EEG done on 07/25/20 no seizure activity noted. keppra stopped So the diagnosis remains VZV inducedmeningoencephalitis) Completed 21 days of acycolvir threapy for VZV induced meningo encephalitison 08/02/20  waxing and waning mentalstatussince earlier in admission  Urinary retention -9/12- has foley-failed  voiding trial on 9/28 and reinserted 9/29  Stiffness and pain lower extremities and neck due to immobility ( Xray and MRI of the rt leg- nothing acute)  Rt sided abdominal firmness and fullness CT abdomen showedFluid collectionpseudocyst  Complicated stay at [facility] between 04/01/20 until 05/14/20 for abdominal pain which was diagnosed as choledocholithiasis/cholangitis in a setting of prior RYGB in 2008. 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 and she was taken to [facility]20 for diagnostic lap, robotic lysis  of adhesions, transgastric ERCP and biliary sphincterotomy, cholecystectomy, partial gastrectomy andaccidentalcolotomy which was repaired.The immediate post op was complicated by post ERCP pancreatitis , pulmonary edema with acute hypoxic resp failure , Afib ( treated with amio and metoprolol), fever and leucocytosis suspected due to pancreatitis and was treated with zosyn, UTI due to proteus and acute on chronic anemia needing PRBC and AKI.She was seen by gerontologist for insomnia and risk for delirium   H/o left TKA  Hearing loss   ID will follow her peripherally- call if needed

## 2020-08-23 NOTE — Plan of Care (Signed)
  Problem: Education: Goal: Knowledge of General Education information will improve Description: Including pain rating scale, medication(s)/side effects and non-pharmacologic comfort measures Outcome: Not Progressing   Problem: Health Behavior/Discharge Planning: Goal: Ability to manage health-related needs will improve Outcome: Not Progressing   Problem: Clinical Measurements: Goal: Ability to maintain clinical measurements within normal limits will improve Outcome: Not Progressing Goal: Will remain free from infection Outcome: Not Progressing Goal: Diagnostic test results will improve Outcome: Not Progressing Goal: Respiratory complications will improve Outcome: Not Progressing Goal: Cardiovascular complication will be avoided Outcome: Not Progressing   Problem: Clinical Measurements: Goal: Will remain free from infection Outcome: Not Progressing   Problem: Clinical Measurements: Goal: Diagnostic test results will improve Outcome: Not Progressing   Problem: Clinical Measurements: Goal: Respiratory complications will improve Outcome: Not Progressing   Problem: Clinical Measurements: Goal: Cardiovascular complication will be avoided Outcome: Not Progressing  Pt refusing meds in applesauce and liquid meds - spitting out into bed - took only few sips and was able to get provigil and megace only -  L arm ML is infiltrated and removed.  MD notified.

## 2020-08-23 NOTE — Progress Notes (Signed)
Patient ID: Casey Baldwin, female   DOB: [birth year], 79 y.o.   MRN: [id number]   [name] MD Brief entry   Chart reviewed but I will have to return in am to finish and make a recommendation  Thanks  Smith Robert MD \ Psych

## 2020-08-23 NOTE — Progress Notes (Signed)
Occupational Therapy Treatment Patient Details Name: Casey Baldwin MRN: [id number] DOB: [birth year] Today's Date: 08/23/2020    History of present illness Casey Baldwin is a 79 y.o. 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  Presented to ER secondary to generalized body aches, progressive weakness, headaches; admitted for management of SIRS (urine?), shingles and acute metabolic encephalopathy.   OT comments  Pt seen for OT tx. Pt sleeping initially but wakes to voice. Pt oriented to self, alert requiring cues to maintain alertness throughout session. Pt noted with poor positioning in bed with slight L lateral lean, cervical L lateral flexion, and slight cervical rotation to the L. Pt continues to demonstrate stiffness. Repositioning with improved midline positioning, requiring pillow support to maintain. Pt required Mod-Max A for washing face and combing hair, however, was able to maintain head unsupported for combing hair for ~13min prior to need for head support 2/2 fatigue. Total assist for hearing aides. Pt demonstrated wavering cognition, at times requiring increased time for responding to questions/commands, easily distracted, and startling to verbal cues. Pt continue to progress slowly towards goals. Continues to benefit from skilled OT services to maximize functional independence and minimize further functional decline and caregiver burden.    Follow Up Recommendations  LTACH;SNF    Equipment Recommendations  None recommended by OT    Recommendations for Other Services      Precautions / Restrictions Precautions Precautions: Fall Restrictions Weight Bearing Restrictions: No Other Position/Activity Restrictions: HOB >30 degrees, aspiration, prevalon boots (no NG tube on 10/13)       Mobility Bed Mobility Overal bed mobility: Needs Assistance             General bed mobility comments:  +2 assist to scoot up in bed for improved positioning  Transfers                      Balance                                           ADL either performed or assessed with clinical judgement   ADL Overall ADL's : Needs assistance/impaired     Grooming: Wash/dry face;Brushing hair;Bed level;Moderate assistance;Maximal assistance Grooming Details (indicate cue type and reason): with improved upright positioning, pt able to initiate with hand under hand, VC, fatigues quickly                                     Vision       Perception     Praxis      Cognition Arousal/Alertness: Lethargic Behavior During Therapy: Flat affect Overall Cognitive Status: No family/caregiver present to determine baseline cognitive functioning                                 General Comments: initially alerts to verbal cues and briefly keeps eyes open, stating she's having a good day but then demo's difficulty with higher level questioning, requires multimodal cuing for following simple commands        Exercises Other Exercises Other Exercises: bed level repositioning to improve midline alignment for cervical spine for ADL tasks and to minimize contracture; stiff and preference  for slight cervical rotation to the L and L lateral flexion; rolled towel to help support positioning Other Exercises: pt able to maintain head upright against gravity without support for approx for brushing hair (required Mod-Max A for brushing)   Shoulder Instructions       General Comments      Pertinent Vitals/ Pain       Pain Assessment: Faces Faces Pain Scale: Hurts little more Pain Location: with bed mobility Pain Descriptors / Indicators: Grimacing;Guarding Pain Intervention(s): Limited activity within patient's tolerance;Monitored during session;Repositioned  Home Living                                          Prior  Functioning/Environment              Frequency  Min 1X/week        Progress Toward Goals  OT Goals(current goals can now be found in the care plan section)  Progress towards OT goals: OT to reassess next treatment  Acute Rehab OT Goals Patient Stated Goal: none stated by pt OT Goal Formulation: Patient unable to participate in goal setting Time For Goal Achievement: 09/03/20 Potential to Achieve Goals: Fair  Plan Frequency remains appropriate;Discharge plan remains appropriate    Co-evaluation                 AM-PAC OT "6 Clicks" Daily Activity     Outcome Measure   Help from another person eating meals?: A Lot Help from another person taking care of personal grooming?: A Lot Help from another person toileting, which includes using toliet, bedpan, or urinal?: Total Help from another person bathing (including washing, rinsing, drying)?: Total Help from another person to put on and taking off regular upper body clothing?: Total Help from another person to put on and taking off regular lower body clothing?: Total 6 Click Score: 8    End of Session    OT Visit Diagnosis: Other abnormalities of gait and mobility (R26.89);Muscle weakness (generalized) (M62.81)   Activity Tolerance Patient tolerated treatment well   Patient Left in bed;with call bell/phone within reach;with bed alarm set;Other (comment) (prevalon boots in place)   Nurse Communication          Time: 0254-2706 OT Time Calculation (min): 23 min  Charges: OT General Charges $OT Visit: 1 Visit OT Treatments $Self Care/Home Management : 23-37 mins  Richrd Prime, MPH, MS, OTR/L ascom [phone] 08/23/20, 1:16 PM

## 2020-08-23 NOTE — Progress Notes (Signed)
Nutrition Follow-up  DOCUMENTATION CODES:   Non-severe (moderate) malnutrition in context of chronic illness  INTERVENTION:  Decrease Ensure Enlive to daily  [employer] Standard 1.4 Vanilla po BID, each supplement provides 455 kcal and 20 grams of protein  Feeding assistance with meals provided by nursing staff  NUTRITION DIAGNOSIS:   Moderate Malnutrition related to chronic illness (cholecystitis s/p LOA, transgastric ERCP and biliary sphincterotomy, cholecystectomy, partial gastrectomy, and colotomy 5/27, hx Roux-en-Y gastric bypass, CHF) as evidenced by mild fat depletion, mild muscle depletion, moderate muscle depletion, percent weight loss. -ongiong  GOAL:   Patient will meet greater than or equal to 90% of their needs -unmet; pt pulled feeding tube, unable to place PEG; providing ONS  MONITOR:   PO intake, Labs, Weight trends, TF tolerance, I & O's  REASON FOR ASSESSMENT:   Consult Enteral/tube feeding initiation and management  ASSESSMENT:   79 y/o female with h/o CHF, HLD, HTN, OSA, DM, Roux-en-y 2010, Lap band 2008, cholecystitis s/p (diagnostic laparoscopy, robotic lysis of adhesions, transgastric ERCP and biliary sphincterotomy, cholecystectomy, partial gastrectomy and colotomy 5/27) and colon resection for diverticulitis who is admitted with UTI, SIRS, shingles and encephalitis  9/3- Admit 9/11- NG tube placed 9/12- TFs started 9/13- Dobhoff placed under fluoroscopic guidance 9/14- s/p LP consistent with viral meningitis/encephalitis 10/5- TF decreased to half rate; calorie count started 10/8- TF switched to nocturnal 10/11- pt pulled Dobhoff  Pt evaluated by surgery for PEG placement, however deemed too risky due to multiple previous abdominal surgeries. OT working with pt this afternoon at RD visit, per notes daughter is concerned about possible depression which may be affecting her desire to eat, psychiatry has been consulted.   Meal completion 0-25% x 4  documented intakes 10/11-10/14. Per chart, weights have trended down ~ 8 lbs over the past week. Pt previously liked [employer] supplements, will decrease Ensure to daily and order [employer] BID.   Medications reviewed and include: SSI, lactulose, Megace, Melatonin  Labs: CBGs 155,125,121,136, WBC 10.8 (H)  Diet Order:   Diet Order            DIET - DYS 1 Room service appropriate? Yes; Fluid consistency: Thin  Diet effective now                 EDUCATION NEEDS:   No education needs have been identified at this time  Skin:  Skin Assessment: Reviewed RN Assessment  Last BM:  10/12-type 6  Height:   Ht Readings from Last 1 Encounters:  07/12/20 5\' 4"  (1.626 m)    Weight:   Wt Readings from Last 1 Encounters:  08/23/20 71.8 kg    Ideal Body Weight:  54.5 kg  BMI:  Body mass index is 27.17 kg/m.  Estimated Nutritional Needs:   Kcal:  1700-1900kcal/day  Protein:  90-105 grams  Fluid:  1.6L/day   08/25/20, RD, LDN Clinical Nutrition After Hours/Weekend Pager # in Amion

## 2020-08-24 DIAGNOSIS — I471 Supraventricular tachycardia: Secondary | ICD-10-CM

## 2020-08-24 LAB — GLUCOSE, CAPILLARY
Glucose-Capillary: 125 mg/dL — ABNORMAL HIGH (ref 70–99)
Glucose-Capillary: 157 mg/dL — ABNORMAL HIGH (ref 70–99)
Glucose-Capillary: 172 mg/dL — ABNORMAL HIGH (ref 70–99)
Glucose-Capillary: 94 mg/dL (ref 70–99)

## 2020-08-24 MED ORDER — MORPHINE SULFATE (CONCENTRATE) 10 MG/0.5ML PO SOLN
5.0000 mg | ORAL | 0 refills | Status: DC | PRN
Start: 2020-08-24 — End: 2020-08-24

## 2020-08-24 MED ORDER — POLYVINYL ALCOHOL 1.4 % OP SOLN
1.0000 [drp] | Freq: Four times a day (QID) | OPHTHALMIC | Status: DC | PRN
  Filled 2020-08-24: qty 15

## 2020-08-24 MED ORDER — NYSTATIN 100000 UNIT/ML MT SUSP: 5.0000 mL | Freq: Four times a day (QID) | OROMUCOSAL | 0 refills | Status: AC

## 2020-08-24 MED ORDER — LORAZEPAM 2 MG/ML PO CONC: 0.5000 mg | ORAL | 0 refills | Status: DC | PRN

## 2020-08-24 MED ORDER — ZINC OXIDE 40 % EX OINT: TOPICAL_OINTMENT | CUTANEOUS | 0 refills | Status: AC | PRN

## 2020-08-24 MED ORDER — MORPHINE SULFATE (CONCENTRATE) 10 MG/0.5ML PO SOLN
5.0000 mg | ORAL | 0 refills | Status: AC | PRN
Start: 2020-08-24 — End: 2020-08-29

## 2020-08-24 MED ORDER — MODAFINIL 100 MG PO TABS: 100.0000 mg | ORAL_TABLET | Freq: Every day | ORAL | 0 refills | Status: DC

## 2020-08-24 MED ORDER — TAMSULOSIN HCL 0.4 MG PO CAPS: 0.4000 mg | ORAL_CAPSULE | Freq: Every day | ORAL | 0 refills | Status: DC

## 2020-08-24 MED ORDER — METOPROLOL TARTRATE 25 MG PO TABS: 12.5000 mg | ORAL_TABLET | Freq: Two times a day (BID) | ORAL | 0 refills | Status: DC

## 2020-08-24 MED ORDER — LORAZEPAM 0.5 MG PO TABS: 0.5000 mg | ORAL_TABLET | ORAL | 0 refills | Status: AC | PRN

## 2020-08-24 MED ORDER — METOPROLOL TARTRATE 25 MG PO TABS: 12.5000 mg | ORAL_TABLET | Freq: Two times a day (BID) | ORAL | 0 refills | Status: AC

## 2020-08-24 MED ORDER — TAMSULOSIN HCL 0.4 MG PO CAPS: 0.4000 mg | ORAL_CAPSULE | Freq: Every day | ORAL | 0 refills | Status: AC

## 2020-08-24 MED ORDER — MORPHINE SULFATE (CONCENTRATE) 10 MG/0.5ML PO SOLN
5.0000 mg | ORAL | Status: DC | PRN
  Filled 2020-08-24 (×2): qty 0.5

## 2020-08-24 MED ORDER — LORAZEPAM 2 MG/ML PO CONC: 0.5000 mg | ORAL | Status: DC | PRN

## 2020-08-24 MED ORDER — MORPHINE SULFATE (CONCENTRATE) 10 MG/0.5ML PO SOLN
5.0000 mg | ORAL | Status: DC | PRN
  Administered 2020-08-24: 5 mg via ORAL

## 2020-08-24 MED ORDER — POLYVINYL ALCOHOL 1.4 % OP SOLN: 1.0000 [drp] | Freq: Four times a day (QID) | OPHTHALMIC | 0 refills | Status: AC | PRN

## 2020-08-24 MED ORDER — LORAZEPAM 0.5 MG PO TABS
0.5000 mg | ORAL_TABLET | ORAL | Status: DC | PRN
  Administered 2020-08-24: 0.5 mg via ORAL
  Filled 2020-08-24: qty 1

## 2020-08-24 MED ORDER — POLYVINYL ALCOHOL 1.4 % OP SOLN: 1.0000 [drp] | Freq: Four times a day (QID) | OPHTHALMIC | 0 refills | Status: DC | PRN

## 2020-08-24 MED ORDER — ZINC OXIDE 40 % EX OINT: TOPICAL_OINTMENT | CUTANEOUS | 0 refills | Status: DC | PRN

## 2020-08-24 MED ORDER — MODAFINIL 100 MG PO TABS: 100.0000 mg | ORAL_TABLET | Freq: Every day | ORAL | 0 refills | Status: AC

## 2020-08-24 NOTE — Discharge Summary (Addendum)
Discharge Summary  Casey Baldwin ZOX:[id number] DOB: June 22, 1941  PCP: Lesly Rubenstein, MD  Admit date: 07/12/2020 Discharge date: 08/24/2020  Time spent: 35 minutes  Recommendations for Outpatient Follow-up:  1. Continue hospice care   Discharge Diagnoses:  Active Hospital Problems   Diagnosis Date Noted  . Viral encephalitis 08/20/2020  . Aspiration pneumonia of both lower lobes due to gastric secretions (HCC) 08/18/2020  . Hyponatremia 08/16/2020  . Obstructive sleep apnea 08/16/2020  . PSVT (paroxysmal supraventricular tachycardia) (HCC) 08/16/2020  . Thrombocytosis 08/16/2020  . Acute urinary retention 08/16/2020  . Malnutrition of moderate degree 07/24/2020  . Anorexia 07/21/2020  . Sepsis (HCC) 07/13/2020  . Change in mental status 07/13/2020  . SIRS (systemic inflammatory response syndrome) (HCC) 07/12/2020  . Shingles rash 07/12/2020  . Delirium 07/12/2020  . Acute metabolic encephalopathy 07/12/2020  . Essential hypertension 07/12/2020  . Anemia 07/12/2020  . Acute lower UTI 07/12/2020  . Chronic diastolic CHF (congestive heart failure) (HCC) 07/12/2020    Resolved Hospital Problems  No resolved problems to display.    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  Family recently noted a rash that was going around her right side of back and  right breast. Rash is intensely pruritic different stages of healing some areas of vesicular with red border. Some areas covered in black eschars. Skin is diffusely tender.  Per family pt developed confusion.  Was c/o side to back pain.  Sed rate was elevated. LP was considered but patient declined stating that she had a history of meningitis in the past and very scared of having LP done. Hospitalist was called for admission for shingles and possible viral encephalitis.  On 07/24/20 she had LP done which revealed lymphocytic pleocytosis.  Per ID Dr. Rivka Safer:  Bacterial culture neg and HSV DNA neg- VZVneg .VzV ig G very high> 4000 west Nile virus IgG in csf was positive but not IgM and serum antibodies neg- so this isless likelyacute WNV and IgG is not a reliable test for diagnosis of acute infection as there is cross reactivity with other flavi virus and dengue-There is no treatment for WNV.  MRI and MRA done on 9/14 no acute changes. Daughter wanted her to be in [place], but no bed andapparently [facility] has declined [facility] transfer Repeat EEG done on 07/25/20 no seizure activity noted. keppra stopped So the diagnosis remains VZV inducedmeningoencephalitis) Completed 21 days of acycolvir threapy for VZV induced meningo encephalitison 08/02/20.  waxing and waning mentalstatussince earlier in admission  Urinary retention -07/22/20- has foley-failed voiding trial on 9/28 and reinserted 08/08/20.  Stiffness and pain lower extremities and neck due to immobility ( Xray and MRI of the rt leg- nothing acute)  Rt sided abdominal firmness and fullness CT abdomen showedFluid collectionpseudocyst.  Complicated stay at [facility] between 04/01/20 until 05/14/20 for abdominal pain which was diagnosed as choledocholithiasis/cholangitis in a setting of prior RYGB  in 2008. 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 and she was taken to [facility] 04/05/20 for diagnostic lap, robotic lysis of  adhesions, transgastric ERCP and biliary sphincterotomy, cholecystectomy, partial gastrectomy andaccidentalcolotomy which was repaired.  The immediate post op was complicated by post ERCP pancreatitis, pulmonary edema with acute hypoxic resp failure, paroxysmal Afib (treated with amio and metoprolol), fever and leucocytosis suspected due to pancreatitis and was treated with zosyn, UTI due to proteus and acute on chronic anemia needing PRBC and AKI. She was seen by gerontologist for insomnia and risk for delirium.  Patient was treated with IV acyclovir, she has so far completed the whole course. Due to poor p.o. intake, a Dobbhoff was placed on 9/13, tube feeding started. LP was performed on 07/24/20. Condition consistent with viral meningitis/encephalitis. EEG did not show any seizure activity. MRI of the brain without contrast on 9/14 showed nonspecific white matter changes, no large vessel occlusion on MRI of the brain.   08/20/20: She developed 1 episode of aspiration.  Was received 2 days of IV antibiotics and 1 day of oral antibiotics.  Seen by ID.  Dobbhoff was pulled off halfway by patient, removed evening of 08/20/20 to reduce risks for aspiration. Patient was evaluated by general surgery for PEG tube placement, deemed to be too risky due to multiple previous abdominal surgeries.  08/24/20:  Seen in her room, alert and minimally interactive.  Per daughter in the room, patent has odynophagia.  Exam benign.  GI consulted to further assess.  Hospital Course:  Active Problems:   SIRS (systemic inflammatory response syndrome) (HCC)   Shingles rash   Delirium   Acute metabolic encephalopathy   Essential hypertension   Anemia   Acute lower UTI   Chronic diastolic CHF (congestive heart failure) (HCC)   Sepsis (HCC)   Change in mental status   Anorexia   Malnutrition of moderate degree   Hyponatremia   Obstructive sleep apnea   PSVT (paroxysmal supraventricular tachycardia) (HCC)    Thrombocytosis   Acute urinary retention   Aspiration pneumonia of both lower lobes due to gastric secretions (HCC)   Viral encephalitis  Acute metabolic encephalopathy, multifactorial secondary to acute illness, Zoster encephalitis, 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 Completed course of acyclovir for viral Zoster encephalitis Completed 2 days of IV antibiotics and 1 day of oral antibiotics for aspiration pneumonia Chest x-ray done on 08/22/2020, showing improvement of lung aeration. Afebrile with no leukocytosis, procalcitonin less than 0.10 Currently off antibiotics, no evidence of active infective process, O2 saturation 96% RA She is more alert. Continue delirium precautions Dubhoff removed on 08/20/20 evening to decrease risk for aspiration. Per daughter in the room, patent has odynophagia.  Exam benign.  GI consulted to further assess. Provigil was started on 08/03/20, Per PMT family has requested to continue, ordered 100 mg daily x 5 days.  Defer to hospice care to continue  Dysphagia and anorexia, suspect a component of depression Evaluated by speech therapy with recommendation for dysphagia 1 pure, thin liquid diet Aspiration precautions and feeding assistance in place Encourage oral intake Psych for possible depression  Questionable odynophagia, reported by patient's daughter Difficult to get assessment from patient due to confusion Nystatin mouth wash, mouth spray No clear evidence of oral thrush, significant irritation, or erythema on exam Nystatin mouthwash ordered as requested by patient's daughter.  Acute urinary retention post Foley catheter placement placed on 08/08/20 Continue to monitor urine output Currently on Flomax, continued as requested by family.  Possible depression  Not on antidepressant at home Psychiatry consulted  Possible aspiration 08/20/20 Tube feeding removed since then Received 2 days of IV Unasyn and 1 day of augmentin Currently afebrile, no  leukocytosis and no evidence of active infective process.  Failure to thrive in an adult Minimal oral intake Continue diet as recommended above Continue oral supplements We will start gentle IV fluid hydration [id number], increase rate to 75 cc/h to avoid dehydration and hypoglycemia. PT OT rec SNF TOC assisting with SNF placement.  Essential HTN Family has requested to continue po lopressor 12.5 mg BID per PMT, was continued.  And to continue home Lasix, held.  Defer to hospice care to continue lasix as patient already has very poor oral intake and was receiving IV fluid in the hospital to avoid dehydration.  Normocytic anemia Hemoglobin uptrending 9.9 from 9.7  No overt bleeding. Continue to monitor  Goals of care Palliative care medicine following DNR Poor prognosis in the setting of persistent encephalopathy, shock, poor oral intake poor functional status.  OSA CPAP qhs  Improved hypovolemic hyponatremia Serum Na+ 134 Monitor  Seen by Nephrology Obtain BMP in the morning  Goals of care Prognosis is very poor  Persistent encephalopathy, anorexia, dysphagia DNR Family, daughter requested hospice care on 08/24/20 Palliative care medicine assisting      Code Status: DNR  Family Communication: Updated her daughter at bedside.    Consultants:  Neurology  Nephrology  Psychiatry consulted on 08/23/2020  GI on 08/24/20  Procedures:  Lumbar puncture  Antimicrobials:  None    Discharge Exam: BP 139/66 (BP Location: Left Arm)   Pulse 85   Temp 98.9 F (37.2 C) (Axillary)   Resp 20   Ht  (1.626 m)   Wt 71.8 kg   SpO2 96%   BMI 27.17 kg/m  . General: 79 y.o. year-old female chronically ill-appearing no acute distress.  Alert and minimally interactive. . Cardiovascular: Regular rate and rhythm with no rubs or gallops.  No thyromegaly or JVD noted.   Marland Kitchen Respiratory: Clear to auscultation with no wheezes or rales. Poor inspiratory  effort. . Abdomen: Soft nontender nondistended with normal bowel sounds x4 quadrants. . Musculoskeletal: No lower extremity edema bilaterally. Marland Kitchen Psychiatry: Mood is appropriate for condition and setting  Discharge Instructions You were cared for by a hospitalist during your hospital stay. If you have any questions about your discharge medications or the care you received while you were in the hospital after you are discharged, you can call the unit and asked to speak with the hospitalist on call if the hospitalist that took care of you is not available. Once you are discharged, your primary care physician will handle any further medical issues. Please note that NO REFILLS for any discharge medications will be authorized once you are discharged, as it is imperative that you return to your primary care physician (or establish a relationship with a primary care physician if you do not have one) for your aftercare needs so that they can reassess your need for medications and monitor your lab values.   Allergies as of 08/24/2020      Reactions   Lidocaine Anaphylaxis, Other (See Comments)   Became unconscious with administration for dental procedure at age 16yo. Pt tolerated subsequent lidocaine.    Amlodipine Other (See Comments)   Hand and leg cramping   Lactose Diarrhea   Lactose Intolerance (gi)    Latex       Medication List    STOP taking these  medications   aspirin 81 MG EC tablet   bisacodyl 5 MG EC tablet Commonly known as: DULCOLAX   calcium carbonate 1250 (500 Ca) MG chewable tablet Commonly known as: OS-CAL   DRY EYES OP   furosemide 20 MG tablet Commonly known as: LASIX   ICAPS AREDS 2 PO   isosorbide mononitrate 30 MG 24 hr tablet Commonly known as: IMDUR   multivitamin with minerals Tabs tablet   ondansetron 4 MG tablet Commonly known as: ZOFRAN   pantoprazole 20 MG tablet Commonly known as: PROTONIX   rosuvastatin 20 MG tablet Commonly known as: CRESTOR     senna-docusate 8.6-50 MG tablet Commonly known as: Senokot-S   VITAMIN D PO   witch hazel-glycerin pad Commonly known as: TUCKS     TAKE these medications   Acetaminophen 500 MG capsule Take 500 mg by mouth daily as needed.   liver oil-zinc oxide 40 % ointment Commonly known as: DESITIN Apply topically as needed for irritation. Buttocks   LORazepam 0.5 MG tablet Commonly known as: ATIVAN Take 1 tablet (0.5 mg total) by mouth every 4 (four) hours as needed for up to 3 days for anxiety.   metoprolol tartrate 25 MG tablet Commonly known as: LOPRESSOR Take 0.5 tablets (12.5 mg total) by mouth 2 (two) times daily.   modafinil 100 MG tablet Commonly known as: PROVIGIL Take 1 tablet (100 mg total) by mouth daily for 5 days. Start taking on: August 25, 2020   morphine CONCENTRATE 10 MG/0.5ML Soln concentrated solution Place 0.25 mLs (5 mg total) under the tongue every 2 (two) hours as needed for up to 5 days for moderate pain (or dyspnea).   nystatin 100000 UNIT/ML suspension Commonly known as: MYCOSTATIN Take 5 mLs (500,000 Units total) by mouth 4 (four) times daily.   polyvinyl alcohol 1.4 % ophthalmic solution Commonly known as: LIQUIFILM TEARS Place 1 drop into both eyes 4 (four) times daily as needed for dry eyes.   tamsulosin 0.4 MG Caps capsule Commonly known as: FLOMAX Take 1 capsule (0.4 mg total) by mouth daily after supper.            Durable Medical Equipment  (From admission, onward)         Start     Ordered   07/17/20 1404  For home use only DME Bedside commode  Once       Comments: Shower chair  Question:  Patient needs a bedside commode to treat with the following condition  Answer:  Weakness   07/17/20 1403         Allergies  Allergen Reactions  . Lidocaine Anaphylaxis and Other (See Comments)    Became unconscious with administration for dental procedure at age 62yo. Pt tolerated subsequent lidocaine.    . Amlodipine Other (See  Comments)    Hand and leg cramping  . Lactose Diarrhea  . Lactose Intolerance (Gi)   . Latex       The results of significant diagnostics from this hospitalization (including imaging, microbiology, ancillary and laboratory) are listed below for reference.    Significant Diagnostic Studies: EEG  Result Date: 07/27/2020 Charlsie Quest, MD     07/27/2020  2:43 PM Patient Name: Tali Cleaves MRN: [id number] Epilepsy Attending: Charlsie Quest Referring Physician/Provider: Dr Erick Blinks Date: 07/27/2020 Duration: 29.42 mins Patient history: 79yo F with ams. EEG to evaluate for seizure Level of alertness: Awake AEDs during EEG study: Gabapentin Technical aspects: This EEG study was done with scalp electrodes  positioned according to the 10-20 International system of electrode placement. Electrical activity was acquired at a sampling rate of 500Hz  and reviewed with a high frequency filter of 70Hz  and a low frequency filter of 1Hz . EEG data were recorded continuously and digitally stored. Description: No posterior dominant rhythm was seen. EEG showed continuous generalized 3 to 6 Hz theta-delta slowing. Photic driving was not seen during photic stimulation. Hyperventilation was not performed.    ABNORMALITY -Continuous slow, generalized  IMPRESSION: This study is suggestive of moderate diffuse encephalopathy, nonspecific etiology. No seizures or epileptiform discharges were seen throughout the recording.    EEG  Result Date: 07/25/2020 , MD     07/25/2020  5:19 PM Patient Name: Genell Thede MRN: Charlsie Quest Epilepsy Attending: 07/27/2020 Referring Physician/Provider: Dr Celesta Gentile Date: 07/25/2020 Duration: 21.01 mins Patient history: 79yo F with ams, facial twitching. EEG to evaluate for seizure Level of alertness: Awake AEDs during EEG study: Keppra Technical aspects: This EEG study was done with scalp electrodes positioned according to the 10-20  International system of electrode placement. Electrical activity was acquired at a sampling rate of 500Hz  and reviewed with a high frequency filter of 70Hz  and a low frequency filter of 1Hz . EEG data were recorded continuously and digitally stored. Description: No posterior dominant rhythm was seen. EEG showed continuous generalized 3 to 6 Hz theta-delta slowing. Photic driving was not seen during photic stimulation. Hyperventilation was not performed.   ABNORMALITY -Continuous slow, generalized IMPRESSION: This study is suggestive of moderate diffuse encephalopathy, nonspecific etiology. No seizures or epileptiform discharges were seen throughout the recording. Erick Blinks   DG Chest 1 View  Result Date: 08/18/2020 CLINICAL DATA:  Aspiration pneumonia. EXAM: CHEST  1 VIEW COMPARISON:  08/09/2020 FINDINGS: Feeding tube is been pulled back with tip now in the distal thoracic esophagus. Heart size is normal. Aortic atherosclerotic calcification noted. Linear opacity in both lung bases is stable and consistent with subsegmental atelectasis. No evidence of pulmonary consolidation or pleural effusion. IMPRESSION: Feeding tube tip now in distal thoracic esophagus. Stable bibasilar subsegmental atelectasis. Electronically Signed   By: M.D.   On: 08/18/2020 10:19   DG Abd 1 View  Result Date: 08/10/2020 CLINICAL DATA:  Feeding tube positioning. EXAM: ABDOMEN - 1 VIEW COMPARISON:  August 10, 2020 (11:21 a.m.) FINDINGS: Stable nasogastric feeding tube positioning is seen with its distal tip overlying the expected region of the gastric cardia. Multiple adjacent surgical sutures and surgical clips are noted. The bowel gas pattern is normal. No radio-opaque calculi or other significant radiographic abnormality are seen. Radiopaque surgical clips are seen within the mid left abdomen and right upper quadrant. IMPRESSION: Stable nasogastric feeding tube positioning with its distal tip overlying the  expected region of the gastric cardia. Electronically Signed   By: 10/18/2020 M.D.   On: 08/10/2020 19:44   DG Abd 1 View  Addendum Date: 08/10/2020   ADDENDUM REPORT: 08/10/2020 16:17 ADDENDUM: Feeding tube optimally should be advanced further into the stomach to prevent potential for gastroesophageal reflux. The proximity of the feeding tube tip to the gastroesophageal junction potentially may result in reflux of food material. Electronically Signed   By: 10/10/2020 III M.D.   On: 08/10/2020 16:17   Result Date: 08/10/2020 CLINICAL DATA:  Ileus EXAM: ABDOMEN - 1 VIEW COMPARISON:  August 09, 2020 FINDINGS: Endotracheal tube tip is in the gastric cardia region. There are postoperative changes in the left abdomen  and pelvis as well as in the right upper quadrant. There is currently no bowel dilatation or air-fluid level to suggest bowel obstruction. No free air. Visualized lung bases clear. IMPRESSION: Feeding tube tip in gastric cardia region. Currently no bowel dilatation or air-fluid level to suggest bowel obstruction. No evident free air on supine examination. Lung bases clear. Postoperative changes at multiple sites noted. Electronically Signed: By: Bretta BangWilliam  Woodruff III M.D. On: 08/10/2020 11:41   DG Abd 1 View  Result Date: 08/09/2020 CLINICAL DATA:  Abdominal distension. EXAM: ABDOMEN - 1 VIEW COMPARISON:  Radiograph 08/06/2020, additional priors. FINDINGS: Tip of the weighted enteric tube is in the region of the distal esophagus. Colon is diffusely gaseous distended. There is moderate formed stool in the right colon, occasional colonic stool balls in the descending colon. Enteric sutures and surgical clips are noted in the midline pelvis. Scattered surgical clips in the upper abdomen and left mid abdomen. No evidence of small bowel dilatation or obstruction. No free air on supine view. No acute osseous abnormalities are seen. IMPRESSION: 1. Diffuse gaseous distention of the colon,  likely colonic ileus. No evidence of small bowel obstruction. 2. Tip of the weighted enteric tube is in the region of the distal esophagus. Recommend advancement/repositioning. Electronically Signed   By: Narda RutherfordMelanie  Sanford M.D.   On: 08/09/2020 21:27   DG Abd 1 View  Result Date: 08/06/2020 CLINICAL DATA:  NG tube placement EXAM: ABDOMEN - 1 VIEW COMPARISON:  07/23/2020 FINDINGS: An enteric tube is present with tip in the left upper quadrant consistent with location in the upper stomach. Postoperative changes in the upper abdomen. Scattered gas and stool in the visualized colon. Linear atelectasis in the lung bases. Calcification of the aorta. IMPRESSION: Enteric tube tip projects over the upper stomach. Electronically Signed   By: Burman NievesWilliam  Stevens M.D.   On: 08/06/2020 00:57   MR ANKLE RIGHT WO CONTRAST  Result Date: 08/17/2020 CLINICAL DATA:  Body aches, right knee swelling, right ankle pain EXAM: MRI OF THE RIGHT ANKLE WITHOUT CONTRAST TECHNIQUE: Multiplanar, multisequence MR imaging of the ankle was performed. No intravenous contrast was administered. COMPARISON:  Radiographs from 08/15/2020 FINDINGS: TENDONS Peroneal: Common peroneus and peroneus brevis tenosynovitis. Posteromedial: Mild tibialis posterior, flexor digitorum longus, and flexor hallucis longus tenosynovitis along with mild to moderate distal tibialis posterior tendinopathy. Correlate clinically in assessing for tibialis posterior dysfunction. Anterior: Unremarkable Achilles: Mild fusiform thickening of the distal Achilles tendon with mildly increased striated accentuated longitudinal T2 signal compatible with tendinopathy. Plantar Fascia: Mildly thickened medial band of the plantar fascia with accentuated internal signal as shown for example on image 28 of series 6, compatible with mild plantar fasciitis. LIGAMENTS Lateral: Borderline thickened appearance of the calcaneonavicular ligament. Medial: Mild thickening of the superomedial  portion of the spring ligament. CARTILAGE Ankle Joint: Mild degenerative chondral thinning with minimal marginal spurring. 5 mm non-fragmented osteochondral lesion or degenerative subcortical cyst of the posteromedial talar dome (image 13, series 5). Subtalar Joints/Sinus Tarsi: Small effusion of the posterior subtalar facet for example on image 8 of series 8. Mild degenerative chondral thinning. Bones: No abnormal signal in the distal tibia at the site of questionable prior demineralization on radiography. No findings of osteomyelitis involving the ankle. Moderate midfoot spurring especially dorsally. Mild to moderate degenerative findings along the Lisfranc joint degenerative subcortical cyst or geode proximally in the dorsal medial cuneiform and in the adjacent portion of the navicular as on image 20 of series 5. Other: No abscess is  observed. IMPRESSION: 1. No findings of osteomyelitis or abscess. 2. Mild plantar fasciitis. 3. Mild tibialis posterior, flexor digitorum longus, and flexor hallucis longus tenosynovitis along with mild to moderate distal tibialis posterior tendinopathy. Correlate clinically in assessing for tibialis posterior dysfunction. 4. Mild thickening of the superomedial portion of the spring ligament. 5. 5 mm non-fragmented osteochondral lesion or degenerative subcortical cyst of the posteromedial talar dome. Mild degenerative chondral thinning in the tibiotalar joint and subtalar joint. Moderate midfoot spurring. 6. Small effusion of the posterior subtalar facet. 7. Mild to moderate degenerative findings along the Lisfranc joint. Electronically Signed   By: Gaylyn Rong M.D.   On: 08/17/2020 07:29   DG Chest Port 1 View  Result Date: 08/22/2020 CLINICAL DATA:  Increased breath sounds EXAM: PORTABLE CHEST 1 VIEW COMPARISON:  08/18/2020 FINDINGS: Cardiac shadow is stable. Feeding catheter has been removed in the interval. Improved aeration in the bases bilaterally is noted although  some platelike atelectasis remains. No sizable effusion is noted. No bony abnormality is seen. IMPRESSION: Overall improved aeration although some basilar platelike atelectasis remains. Electronically Signed   By: Alcide Clever M.D.   On: 08/22/2020 18:39   DG Chest Port 1 View  Result Date: 08/09/2020 CLINICAL DATA:  Diffuse body aches. Headaches. Prior gastric surgery. EXAM: PORTABLE CHEST 1 VIEW COMPARISON:  07/17/2020 FINDINGS: New densities at the right lung base are most compatible with atelectasis. Atelectasis or scarring at the left lung base. There is a feeding tube extending into the lower chest and the tip is near the GE junction. Upper lungs are clear. Heart size is within normal limits and stable. Negative for a pneumothorax. IMPRESSION: 1. Increased patchy densities at the right lung base are suggestive for atelectasis. Small amount of atelectasis or scarring at the left lung base. 2. Feeding tube tip is near the GE junction.  Prior gastric surgery. Electronically Signed   By: Richarda Overlie M.D.   On: 08/09/2020 16:40   DG Ankle Right Port  Result Date: 08/15/2020 CLINICAL DATA:  Right ankle pain. EXAM: PORTABLE RIGHT ANKLE - 2 VIEW COMPARISON:  Lower leg radiographs 07/10/2020. FINDINGS: AP and lateral views obtained portably. There is no evidence of acute fracture or dislocation. There is suspected bone rarefaction of the distal tibial cortex medially with questionable overlying soft tissue irregularity. No evidence of foreign body or soft tissue emphysema. The ankle joint spaces are preserved, and no significant joint effusion is seen. Mild midfoot degenerative changes are present. 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 No acute osseous findings or significant arthropathic changes. Correlate clinically and consider MRI for further evaluation. Electronically Signed   By: Carey Bullocks M.D.   On: 08/15/2020 13:52   [place] EKG SITE  RITE  Result Date: 08/03/2020 If Site Rite image not attached, placement could not be confirmed due to current cardiac rhythm.   Microbiology: No results found for this or any previous visit (from the past 240 hour(s)).   Labs: Basic Metabolic Panel: Recent Labs  Lab 08/19/20 0542 08/21/20 0344  NA 134* 134*  K 4.0 4.3  CL 100 99  CO2 24 24  GLUCOSE 139* 126*  BUN 28* 25*  CREATININE 0.50 0.47  CALCIUM 8.8* 9.5  MG 2.0 2.0  PHOS 3.3  --    Liver Function Tests: No results for input(s): AST, ALT, ALKPHOS, BILITOT, PROT, ALBUMIN in the last 168 hours. No results for input(s): LIPASE, AMYLASE in the last 168 hours. No  results for input(s): AMMONIA in the last 168 hours. CBC: Recent Labs  Lab 08/19/20 0542 08/21/20 0344 08/23/20 0625  WBC 10.3 10.4 10.8*  NEUTROABS 8.2* 8.2* 8.3*  HGB 8.8* 9.7* 9.9*  HCT 26.7* 28.9* 28.9*  MCV 99.3 97.3 96.0  PLT 274 366 429*   Cardiac Enzymes: No results for input(s): CKTOTAL, CKMB, CKMBINDEX, TROPONINI in the last 168 hours. BNP: BNP (last 3 results) Recent Labs    07/10/20 0753  BNP 212.1*    ProBNP (last 3 results) No results for input(s): PROBNP in the last 8760 hours.  CBG: Recent Labs  Lab 08/23/20 2119 08/23/20 2344 08/24/20 0355 08/24/20 0821 08/24/20 1222  GLUCAP 139* 157* 94 125* 172*       Signed:  Darlin Drop, MD [facility] 08/24/2020, 4:14 PM

## 2020-08-24 NOTE — Care Management Important Message (Signed)
Important Message  Patient Details  Name: Casey Baldwin MRN: [id number] Date of Birth: [birth year]   Medicare Important Message Given:  Yes     [name] 08/24/2020, 10:17 AM

## 2020-08-24 NOTE — Progress Notes (Signed)
PT Cancellation Note  Patient Details Name: Casey Baldwin MRN: [id number] DOB: 11/07/1941   Cancelled Treatment:    Reason Eval/Treat Not Completed: Other (comment).  PT order cancelled by Palliative Care NP [facility].  Per chart pt to discharge home today with hospice.  Hendricks Limes, PT 08/24/20, 2:54 PM

## 2020-08-24 NOTE — Progress Notes (Signed)
Manufacturing engineer hospital liaison note:  New referral for [facility] hospice services at home received from [facility] and Palliative NP [employer]. Patient information given to referral.  Hospice eligibility has been confirmed  Writer met with patient's daughter Manuela Schwartz in person and daughter Rollene Fare via teleconference to initiate education regarding hospice services, philosophy and team approach to care with understanding voiced. Questions answered. Hospice information and contact info given to Manuela Schwartz  DME needs discussed. Hospital bed is needed in place prior to discharge. DME ordered. Family would like patient to discharge home today when DME is in place.  Hospital team updated.  Patient will require non-emergent transport at discharge with out of facility DNR in place. Patient will also need prescriptions for comfort medications discussed with TOC, NP and family. Thank you for the opportunity to be involved in the care of this patient and her family.  Flo Shanks BSN, RN, [place] [phone]

## 2020-08-24 NOTE — Plan of Care (Signed)
Pt continues to have difficulty following commands- holds liquids in her mouth at times and at other times swallows with no problems. Concerns for aspiration and educated family on making sure pt is awake fully alerta dnn sitting up prior to putting anything in her mouth and to f/u and check mouth to make sure she swallowed.  Understanding voiced but reinforcement needed

## 2020-08-24 NOTE — Progress Notes (Addendum)
Daily Progress Note   Patient Name: Casey Baldwin       Date: 08/24/2020 DOB: 12-30-1940  Age: 79 y.o. MRN#: [id number] Attending Physician: Kayleen Memos, DO Primary Care Physician: Annice Needy, MD Admit Date: 07/12/2020  Reason for Consultation/Follow-up: Establishing goals of care  Subjective: Patient resting in bed. Santiago Glad, RN (ACC) and NT at the bed attempting to reposition for comfort. Patient observed yelling out in pain and moaning. When assessed she states "I hurt everywhere".   Daughter, Manuela Schwartz is at the bedside. Discussed at length with daughters Manuela Schwartz and Rollene Fare (via phone) updates regarding patient's poor prognosis, minimal improvement, and continued poor appetite. Rollene Fare expressed her concerns regarding her mother's appetite and sore throat. Therapeutic listening provided. I discussed in detail patient's poor appetite for several weeks despite use of feeding tube. Reviewed previous goals of care discussions with myself and Dr. Parke Simmers. Daughters verbalized understanding and continued to express goals for patient to return home ASAP and be with family focusing on comfort.   Daughters inquired about home TPN. Education provided on the goals and situations in which TPN is used with recommendations against this for Ms. Rigg. Daughters verbalized their awareness of their mothers continued decline sharing they know she would not want to continue life in such a state knowing her quality of life meant the most to her.   Verbalized agreement in focusing on her comfort including administration of pain medication to allow her some pain relief as she is observed moaning in bed. Education by myself in addition to Santiago Glad, RN [facility] Liaison) regarding medications for symptom management. I reviewed medications with Manuela Schwartz and expectations as the goal has been decided to focus on comfort. Family is aware some medications will be discontinued with addition to comfort medications.    Both Manuela Schwartz and Rollene Fare verbalized wishes to focus on patient's comfort with a goal of discharging home with outpatient hospice support. They expressed needs for home equipment. Santiago Glad, RN discussed at length referral process, arrangements for DME, and transitioning patient back home. Daughters are hopeful patient can return home if not today tomorrow at the latest.   Education provided on MOST form. Form completed as requested by family. The family outlined their wishes for the following treatment decisions:  Cardiopulmonary Resuscitation: Do Not Attempt Resuscitation (DNR/No CPR)  Medical Interventions: Comfort Measures: Keep clean, warm, and dry. Use medication by any route, positioning, wound care, and other measures to relieve pain and suffering. Use oxygen, suction and manual treatment of airway obstruction as needed for comfort. Do not transfer to the hospital unless comfort needs cannot be met in current location.  Antibiotics: Determine use of limitation of antibiotics when infection occurs  IV Fluids: No IV fluids (provide other measures to ensure comfort)  Feeding Tube: No feeding tube   All questions answered and support provided.   Length of Stay: 42 days  Vital Signs: BP 139/66 (BP Location: Left Arm)   Pulse 85   Temp 98.9 F (37.2 C) (Axillary)   Resp 20   Ht _0  (1.626 m)   Wt 71.8 kg   SpO2 96%   BMI 27.17 kg/m  SpO2: SpO2: 96 % O2 Device: O2 Device: Room Air O2 Flow Rate: O2 Flow Rate (L/min): 3 L/min  Physical Exam: Moaning, appears uncomfortable, expresses she is in pain     Normal breathing pattern Will follow some commands          Palliative Care Assessment & Plan   Code Status:  DNR  Goals of Care/Recommendations:  DNR/DNI-confirmed by family, MOST form and DNR completed and placed on chart  Care to focus on comfort with a goal of discharging home in the next 24hrs with outpatient hospice support as requested by family.   Detailed  discussion/education regarding patient's poor prognosis, continued decline, and quality of life.   Santiago Glad, RN [facility] Liaison) at the bedside providing education and arranging home DME etc.   D/C home with foley catheter in place for skin comfort  Patient will need comfort RX at discharge (Roxanol/ativan)  Morphine PRN for pain/air hunger  Ativan PRN for agitation  Comfort Care measures  Unrestricted visitation  Prognosis: Weeks   Discharge Planning: Home with Hospice  Thank you for allowing the Palliative Medicine Team to assist in the care of this patient.  Time Total: 65 min.   Visit consisted of counseling and education dealing with the complex and emotionally intense issues of symptom management and palliative care in the setting of serious and potentially life-threatening illness.Greater than 50%  of this time was spent counseling and coordinating care related to the above assessment and plan.  Alda Lea, AGPCNP-BC  Palliative Medicine Team [phone]

## 2020-08-24 NOTE — TOC Progression Note (Signed)
Transition of Care [facility] - Progression Note    Patient Details  Name: Casey Baldwin MRN: [id number] Date of Birth: [birth year]  Transition of Care [facility]) CM/SW [place], RN Phone Number: 08/24/2020, 2:11 PM  Clinical Narrative:     9:00am- RNCM met with patient's daughter, Manuela Schwartz outside of room per her request. Manuela Schwartz discuss's that they want to take their mom home with Hospice, they have reached out to [facility] and made the referral and they would like to speak to [facility] Liaison nurse. RNCM reached out to Santiago Glad with [facility] and they will assess.   2:15pm- RNCM arranged transport for 5:30pm with [facility].          Expected Discharge Plan and Services           Expected Discharge Date: 08/24/20                                     Social Determinants of Health (SDOH) Interventions    Readmission Risk Interventions No flowsheet data found.

## 2020-09-06 LAB — MISC LABCORP TEST (SEND OUT): Labcorp test code: 183764

## 2020-09-10 DEATH — deceased

## 2020-10-10 DEATH — deceased

## 2020-12-27 IMAGING — MR MR ANKLE*R* W/O CM
5 series · 40 of 40 positions shown · non-contrast
Comparison: Radiographs from 08/15/2020

CLINICAL DATA: Body aches, right knee swelling, right ankle pain

EXAM:
MRI OF THE RIGHT ANKLE WITHOUT CONTRAST
TECHNIQUE: Multiplanar, multisequence MR imaging of the ankle was performed. No
intravenous contrast was administered.

[Series 4: PD fat-sat · axial · right · 3.0mm · 0.50mm/px · z∈[-86,+53]mm · 10 of 36 slices shown]
[im 1/36]
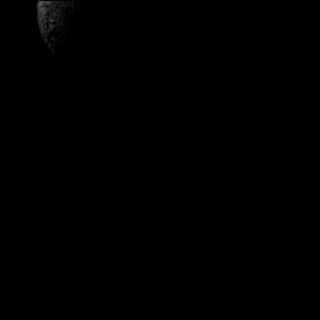
[im 4/36]
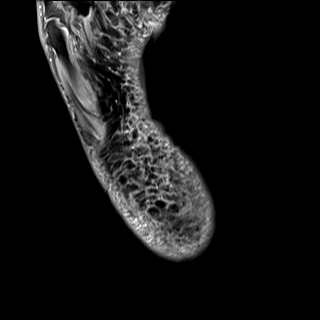
[im 8/36]
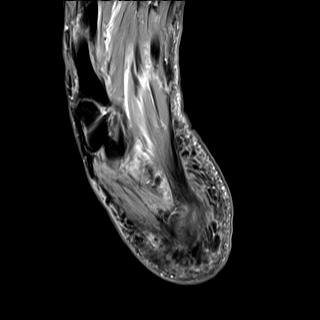
[im 12/36]
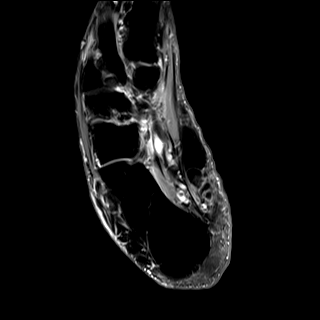
[im 16/36]
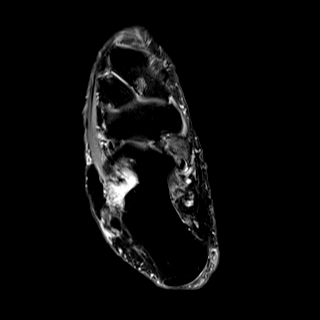
[im 20/36]
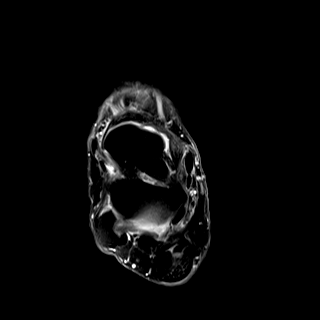
[im 24/36]
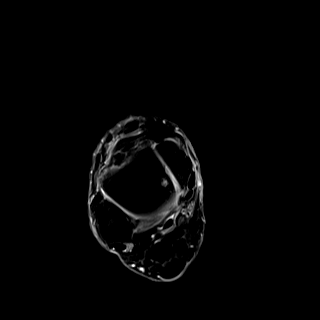
[im 28/36]
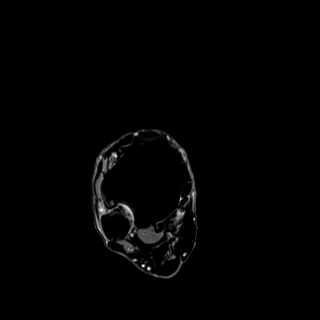
[im 32/36]
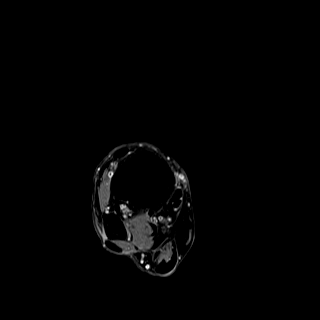
[im 36/36]
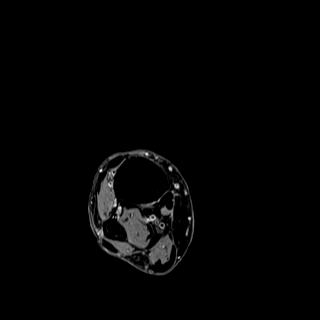

[Series 5: T2 fat-sat · axial · right · 3.0mm · 0.50mm/px · z∈[-86,+53]mm · 10 of 36 slices shown (1 of 2)]
[im 1/36]
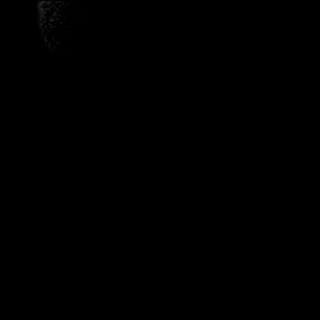
[im 4/36]
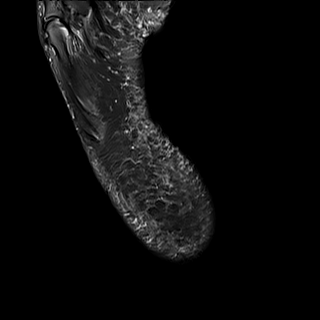
[im 8/36]
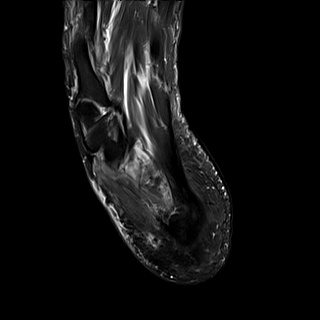
[im 12/36]
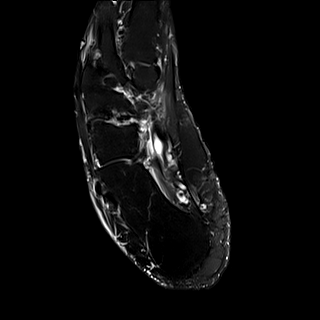
[im 16/36]
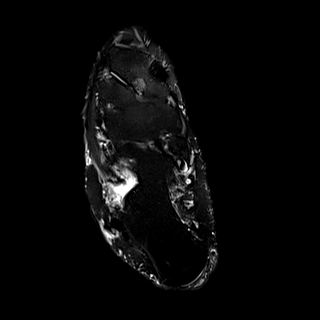
[im 20/36]
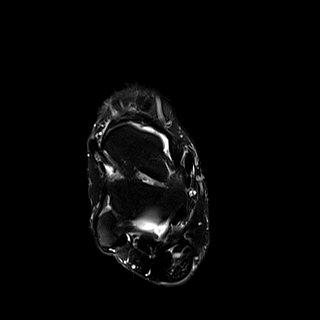
[im 24/36]
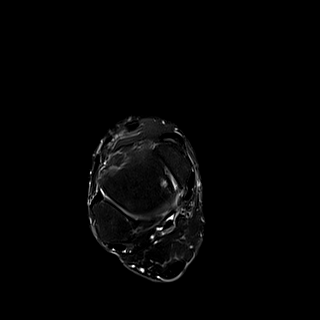
[im 28/36]
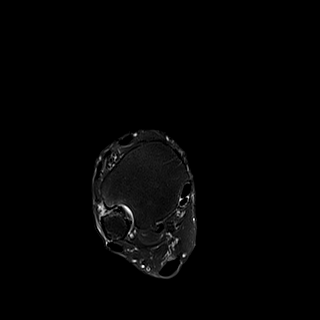
[im 32/36]
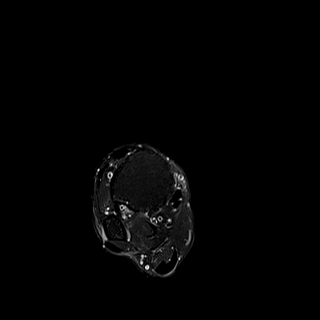
[im 36/36]
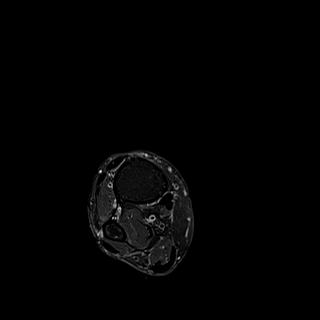

[Series 6: T2 fat-sat · coronal · right · 3.0mm · 0.62mm/px · 10 of 40 slices shown (2 of 2)]
[im 1/40]
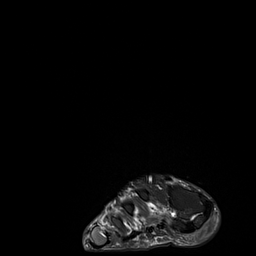
[im 5/40]
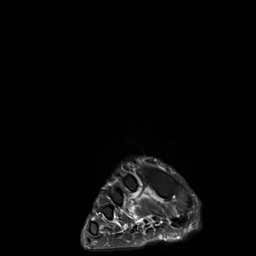
[im 9/40]
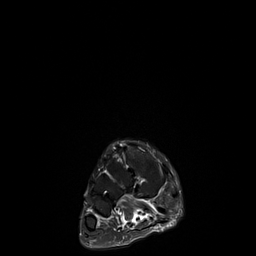
[im 14/40]
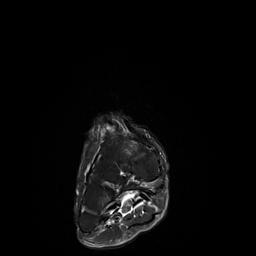
[im 18/40]
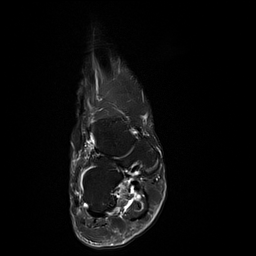
[im 22/40]
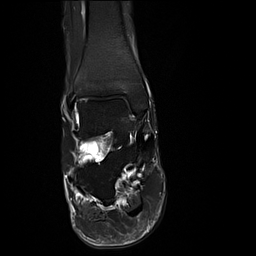
[im 27/40]
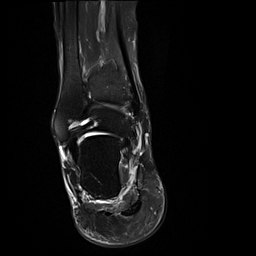
[im 31/40]
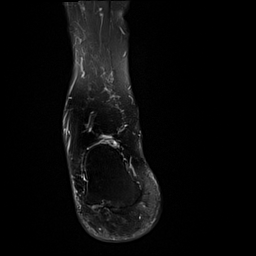
[im 35/40]
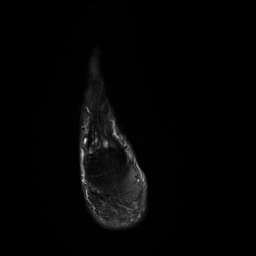
[im 40/40]
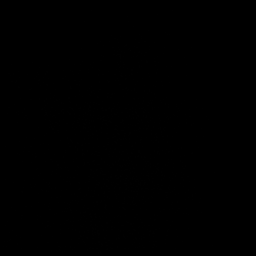

[Series 7: T1 · sagittal · right · 4.0mm · 0.70mm/px · 5 of 21 slices shown]
[im 1/21]
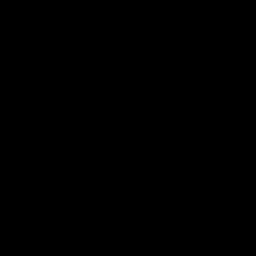
[im 6/21]
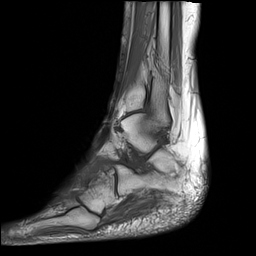
[im 11/21]
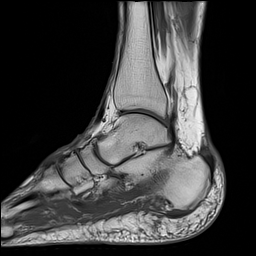
[im 16/21]
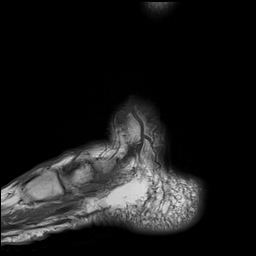
[im 21/21]
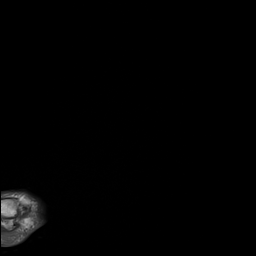

[Series 8: STIR · sagittal · right · 4.0mm · 0.35mm/px · 5 of 21 slices shown]
[im 1/21]
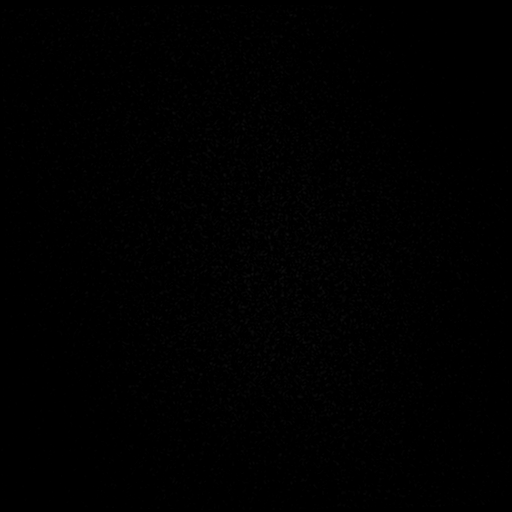
[im 6/21]
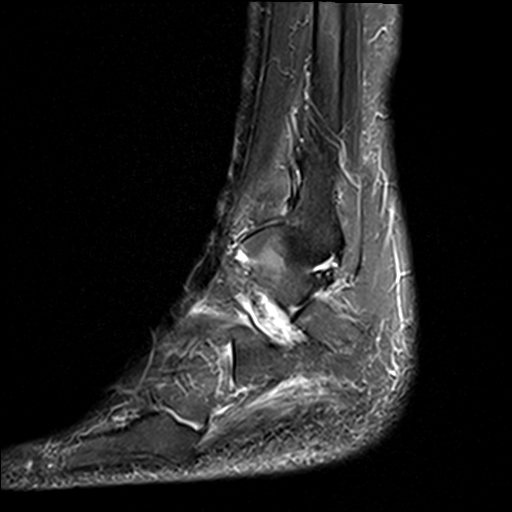
[im 11/21]
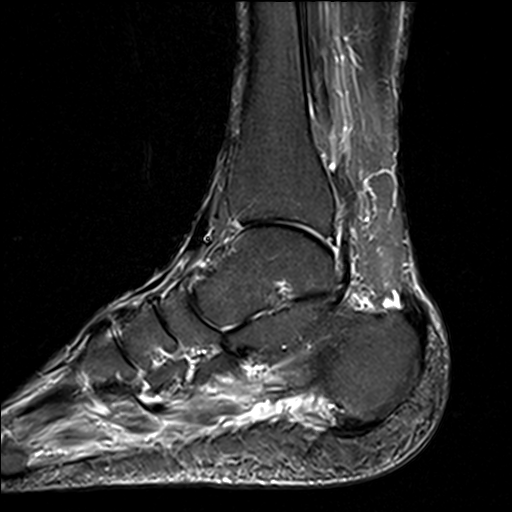
[im 16/21]
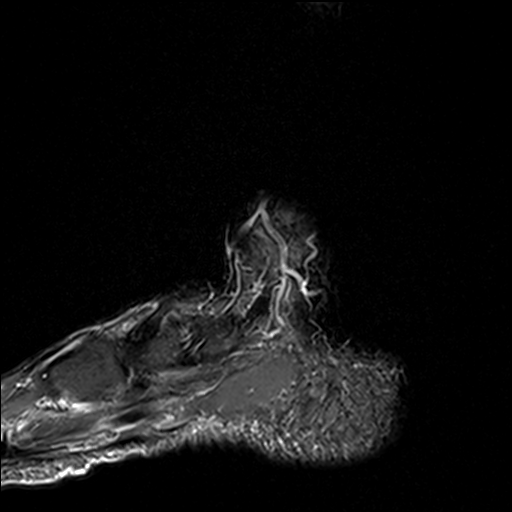
[im 21/21]
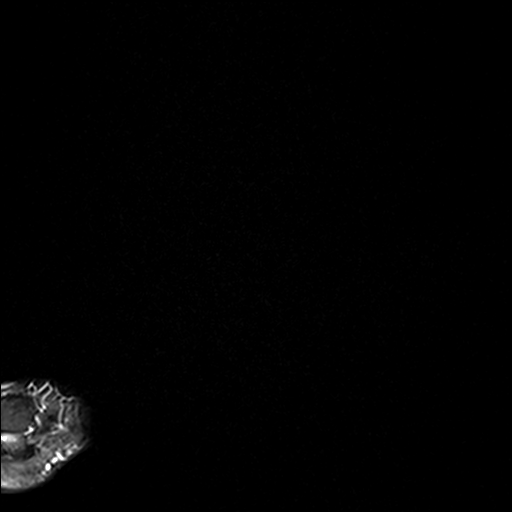

[40 of 40 positions shown; findings below may reference images not displayed]

FINDINGS: TENDONS

Peroneal: Common peroneus and peroneus brevis tenosynovitis.

Posteromedial: Mild tibialis posterior, flexor digitorum longus, and
flexor hallucis longus tenosynovitis along with mild to moderate
distal tibialis posterior tendinopathy. Correlate clinically in
assessing for tibialis posterior dysfunction.

Anterior: Unremarkable

Achilles: Mild fusiform thickening of the distal Achilles tendon
with mildly increased striated accentuated longitudinal T2 signal
compatible with tendinopathy.

Plantar Fascia: Mildly thickened medial band of the plantar fascia
with accentuated internal signal as shown for example on image 28 of
series 6, compatible with mild plantar fasciitis.

LIGAMENTS

Lateral: Borderline thickened appearance of the calcaneonavicular
ligament.

Medial: Mild thickening of the superomedial portion of the spring
ligament.

CARTILAGE

Ankle Joint: Mild degenerative chondral thinning with minimal
marginal spurring. 5 mm non-fragmented osteochondral lesion or
degenerative subcortical cyst of the posteromedial talar dome (image
13, series 5).

Subtalar Joints/Sinus Tarsi: Small effusion of the posterior
subtalar facet for example on image 8 of series 8. Mild degenerative
chondral thinning.

Bones: No abnormal signal in the distal tibia at the site of
questionable prior demineralization on radiography. No findings of
osteomyelitis involving the ankle.

Moderate midfoot spurring especially dorsally. Mild to moderate
degenerative findings along the Lisfranc joint degenerative
subcortical cyst or geode proximally in the dorsal medial cuneiform
and in the adjacent portion of the navicular as on image 20 of
series 5.

Other: No abscess is observed.
IMPRESSION: 1. No findings of osteomyelitis or abscess.
2. Mild plantar fasciitis.
3. Mild tibialis posterior, flexor digitorum longus, and flexor
hallucis longus tenosynovitis along with mild to moderate distal
tibialis posterior tendinopathy. Correlate clinically in assessing
for tibialis posterior dysfunction.
4. Mild thickening of the superomedial portion of the spring
ligament.
5. 5 mm non-fragmented osteochondral lesion or degenerative
subcortical cyst of the posteromedial talar dome. Mild degenerative
chondral thinning in the tibiotalar joint and subtalar joint.
Moderate midfoot spurring.
6. Small effusion of the posterior subtalar facet.
7. Mild to moderate degenerative findings along the Lisfranc joint.
